# Patient Record
Sex: Male | Born: 1960 | Race: White | Hispanic: No | Marital: Married | Smoking: Never smoker
Health system: Southern US, Community
[De-identification: ages and names within clinical notes are randomized; demographics above are authoritative.]

## PROBLEM LIST (undated history)

## (undated) DIAGNOSIS — H269 Unspecified cataract: Secondary | ICD-10-CM

## (undated) DIAGNOSIS — N183 Chronic kidney disease, stage 3 unspecified: Secondary | ICD-10-CM

## (undated) DIAGNOSIS — H01006 Unspecified blepharitis left eye, unspecified eyelid: Secondary | ICD-10-CM

## (undated) DIAGNOSIS — E119 Type 2 diabetes mellitus without complications: Secondary | ICD-10-CM

## (undated) DIAGNOSIS — I6502 Occlusion and stenosis of left vertebral artery: Secondary | ICD-10-CM

## (undated) DIAGNOSIS — I252 Old myocardial infarction: Secondary | ICD-10-CM

## (undated) DIAGNOSIS — E1142 Type 2 diabetes mellitus with diabetic polyneuropathy: Secondary | ICD-10-CM

## (undated) DIAGNOSIS — E669 Obesity, unspecified: Secondary | ICD-10-CM

## (undated) DIAGNOSIS — I10 Essential (primary) hypertension: Secondary | ICD-10-CM

## (undated) DIAGNOSIS — Z85828 Personal history of other malignant neoplasm of skin: Secondary | ICD-10-CM

## (undated) DIAGNOSIS — Z794 Long term (current) use of insulin: Secondary | ICD-10-CM

## (undated) DIAGNOSIS — E1169 Type 2 diabetes mellitus with other specified complication: Secondary | ICD-10-CM

## (undated) DIAGNOSIS — H01003 Unspecified blepharitis right eye, unspecified eyelid: Secondary | ICD-10-CM

## (undated) DIAGNOSIS — K219 Gastro-esophageal reflux disease without esophagitis: Secondary | ICD-10-CM

## (undated) DIAGNOSIS — Z9889 Other specified postprocedural states: Secondary | ICD-10-CM

## (undated) DIAGNOSIS — E785 Hyperlipidemia, unspecified: Secondary | ICD-10-CM

## (undated) DIAGNOSIS — E66811 Obesity, class 1: Secondary | ICD-10-CM

## (undated) DIAGNOSIS — E118 Type 2 diabetes mellitus with unspecified complications: Secondary | ICD-10-CM

## (undated) HISTORY — DX: Type 2 diabetes mellitus without complications: E11.9

## (undated) HISTORY — DX: Chronic kidney disease, stage 3 unspecified: N18.30

## (undated) HISTORY — DX: Type 2 diabetes mellitus with other specified complication: E78.5

## (undated) HISTORY — PX: CHOLECYSTECTOMY: SHX55

## (undated) HISTORY — DX: Unspecified blepharitis right eye, unspecified eyelid: H01.003

## (undated) HISTORY — PX: TRANSTHORACIC ECHOCARDIOGRAM: SHX275

## (undated) HISTORY — DX: Essential (primary) hypertension: I10

## (undated) HISTORY — DX: Gastro-esophageal reflux disease without esophagitis: K21.9

## (undated) HISTORY — DX: Unspecified blepharitis left eye, unspecified eyelid: H01.006

## (undated) HISTORY — DX: Occlusion and stenosis of left vertebral artery: I65.02

## (undated) HISTORY — DX: Type 2 diabetes mellitus with diabetic polyneuropathy: E11.42

## (undated) HISTORY — DX: Old myocardial infarction: I25.2

## (undated) HISTORY — DX: Long term (current) use of insulin: Z79.4

## (undated) HISTORY — DX: Obesity, class 1: E66.811

## (undated) HISTORY — DX: Unspecified cataract: H26.9

## (undated) HISTORY — DX: Other specified postprocedural states: Z85.828

## (undated) HISTORY — DX: Type 2 diabetes mellitus with other specified complication: E11.69

## (undated) HISTORY — DX: Obesity, unspecified: E66.9

## (undated) HISTORY — DX: Type 2 diabetes mellitus with unspecified complications: E11.8

## (undated) HISTORY — DX: Other specified postprocedural states: Z98.890

---

## 2010-04-29 HISTORY — PX: NEPHRECTOMY: SHX65

## 2013-04-29 DIAGNOSIS — I2510 Atherosclerotic heart disease of native coronary artery without angina pectoris: Secondary | ICD-10-CM

## 2013-04-29 HISTORY — DX: Atherosclerotic heart disease of native coronary artery without angina pectoris: I25.10

## 2013-04-29 HISTORY — PX: CORONARY STENT INTERVENTION: CATH118234

## 2013-07-26 DIAGNOSIS — E785 Hyperlipidemia, unspecified: Secondary | ICD-10-CM | POA: Insufficient documentation

## 2013-07-26 DIAGNOSIS — E1169 Type 2 diabetes mellitus with other specified complication: Secondary | ICD-10-CM | POA: Insufficient documentation

## 2013-07-26 DIAGNOSIS — I10 Essential (primary) hypertension: Secondary | ICD-10-CM | POA: Insufficient documentation

## 2014-02-15 ENCOUNTER — Other Ambulatory Visit: Payer: Self-pay | Admitting: Podiatrist

## 2014-02-15 ENCOUNTER — Encounter: Payer: Self-pay | Admitting: Podiatrist

## 2014-02-15 ENCOUNTER — Ambulatory Visit (INDEPENDENT_AMBULATORY_CARE_PROVIDER_SITE_OTHER): Payer: Medicare Other | Admitting: Podiatrist

## 2014-02-15 ENCOUNTER — Ambulatory Visit (INDEPENDENT_AMBULATORY_CARE_PROVIDER_SITE_OTHER): Payer: Medicare Other

## 2014-02-15 VITALS — BP 130/89 | HR 76 | Resp 12

## 2014-02-15 DIAGNOSIS — R52 Pain, unspecified: Secondary | ICD-10-CM

## 2014-02-15 DIAGNOSIS — D3610 Benign neoplasm of peripheral nerves and autonomic nervous system, unspecified: Secondary | ICD-10-CM

## 2014-02-15 DIAGNOSIS — E1149 Type 2 diabetes mellitus with other diabetic neurological complication: Secondary | ICD-10-CM

## 2014-02-15 MED ORDER — PREGABALIN 150 MG PO CAPS: 150.0000 mg | ORAL_CAPSULE | Freq: Two times a day (BID) | ORAL | Status: DC

## 2014-02-15 NOTE — Progress Notes (Signed)
   Subjective:    Patient ID: AMER ALCINDOR, male    DOB: [birth year], 53 y.o.   MRN: [id number]  HPI  PT STATED B/L UNDER ARCH AND BALL OF THE FOOT IS PAINFUL AND HAVE BURNING SENSATION FOR 3 MONTHS. FEET ARE GETTING WORSE AND GET AGGRAVATED BY WALKING. TRIED NO TREATMENT.   ALSO CHECK B/L TOENAILS ARE PAINFUL AND HAVE DISCOLORATION.  Review of Systems  All other systems reviewed and are negative.      Objective:   Physical Exam GENERAL APPEARANCE: Alert, conversant. Appropriately groomed. No acute distress.  VASCULAR: Pedal pulses palpable at 2/4 DP and PT bilateral.  Capillary refill time is immediate to all digits,  Proximal to distal cooling it warm to warm.  Digital hair growth is present bilateral  NEUROLOGIC: sensation is intact epicritically and protectively to 5.07 monofilament at 0/5 sites bilateral.  Light touch is decreased bilateral, vibratory sensation decreased bilateral.  Neuroma symptomatology noted 3rd interspace bilateral subjectively.  MUSCULOSKELETAL: acceptable muscle strength, tone and stability bilateral.  Intrinsic muscluature intact bilateral.  Rectus appearance of foot and digits noted bilateral.   DERMATOLOGIC: digital nails are yellow, friable and mycotic.  No interdigital maceration present.  No pre ulcerative lesion.     Assessment & Plan:  Diabetic peripheral neuropathy, neuroma bilateral  Plan:  Recommended injections into the neuroma areas and patient declined.  Started him on Lyrica as he did not notice any improvement with Gabapentin.  Wrote for 150mg  twice a day.  He will call if there is no improvement in 4 weeks.

## 2014-02-15 NOTE — Patient Instructions (Signed)

## 2015-01-28 HISTORY — PX: BASAL CELL CARCINOMA EXCISION: SHX1214

## 2015-04-10 HISTORY — PX: LEFT HEART CATH AND CORONARY ANGIOGRAPHY: CATH118249

## 2015-04-10 HISTORY — PX: CORONARY STENT INTERVENTION: CATH118234

## 2016-04-04 HISTORY — PX: CORONARY STENT INTERVENTION: CATH118234

## 2016-04-04 HISTORY — PX: LEFT HEART CATH AND CORONARY ANGIOGRAPHY: CATH118249

## 2016-06-27 DIAGNOSIS — Z8679 Personal history of other diseases of the circulatory system: Secondary | ICD-10-CM

## 2016-06-27 HISTORY — DX: Personal history of other diseases of the circulatory system: Z86.79

## 2016-06-27 HISTORY — PX: PACEMAKER IMPLANT: EP1218

## 2018-08-20 HISTORY — PX: NM MYOVIEW LTD: HXRAD82

## 2018-11-17 HISTORY — PX: CORONARY STENT INTERVENTION: CATH118234

## 2018-11-17 HISTORY — PX: LEFT HEART CATH AND CORONARY ANGIOGRAPHY: CATH118249

## 2018-12-25 HISTORY — PX: OTHER SURGICAL HISTORY: SHX169

## 2018-12-28 DIAGNOSIS — I2510 Atherosclerotic heart disease of native coronary artery without angina pectoris: Secondary | ICD-10-CM | POA: Insufficient documentation

## 2018-12-28 HISTORY — PX: CORONARY STENT INTERVENTION: CATH118234

## 2018-12-28 HISTORY — PX: LEFT HEART CATH AND CORONARY ANGIOGRAPHY: CATH118249

## 2019-02-28 DIAGNOSIS — G4733 Obstructive sleep apnea (adult) (pediatric): Secondary | ICD-10-CM

## 2019-02-28 HISTORY — DX: Obstructive sleep apnea (adult) (pediatric): G47.33

## 2019-03-24 LAB — ABI
Left ABI: 125
Right ABI: 132

## 2019-04-25 DIAGNOSIS — U071 COVID-19: Secondary | ICD-10-CM

## 2019-04-25 HISTORY — DX: COVID-19: U07.1

## 2019-05-18 DIAGNOSIS — Z86711 Personal history of pulmonary embolism: Secondary | ICD-10-CM | POA: Insufficient documentation

## 2019-05-18 DIAGNOSIS — I2699 Other pulmonary embolism without acute cor pulmonale: Secondary | ICD-10-CM

## 2019-05-18 DIAGNOSIS — Z7901 Long term (current) use of anticoagulants: Secondary | ICD-10-CM | POA: Insufficient documentation

## 2019-05-18 HISTORY — DX: Other pulmonary embolism without acute cor pulmonale: I26.99

## 2019-07-15 ENCOUNTER — Encounter: Payer: Self-pay | Admitting: Cardiology

## 2019-07-15 ENCOUNTER — Encounter (INDEPENDENT_AMBULATORY_CARE_PROVIDER_SITE_OTHER): Payer: Self-pay

## 2019-07-15 ENCOUNTER — Ambulatory Visit: Payer: Medicare Other | Admitting: Cardiology

## 2019-07-15 ENCOUNTER — Other Ambulatory Visit: Payer: Self-pay

## 2019-07-15 VITALS — BP 130/70 | HR 99 | Temp 97.7°F | Ht 68.0 in | Wt 212.0 lb

## 2019-07-15 DIAGNOSIS — I2510 Atherosclerotic heart disease of native coronary artery without angina pectoris: Secondary | ICD-10-CM

## 2019-07-15 DIAGNOSIS — E785 Hyperlipidemia, unspecified: Secondary | ICD-10-CM

## 2019-07-15 DIAGNOSIS — I2699 Other pulmonary embolism without acute cor pulmonale: Secondary | ICD-10-CM

## 2019-07-15 DIAGNOSIS — E118 Type 2 diabetes mellitus with unspecified complications: Secondary | ICD-10-CM

## 2019-07-15 DIAGNOSIS — G4733 Obstructive sleep apnea (adult) (pediatric): Secondary | ICD-10-CM

## 2019-07-15 DIAGNOSIS — Z7901 Long term (current) use of anticoagulants: Secondary | ICD-10-CM

## 2019-07-15 DIAGNOSIS — R079 Chest pain, unspecified: Secondary | ICD-10-CM

## 2019-07-15 DIAGNOSIS — I10 Essential (primary) hypertension: Secondary | ICD-10-CM

## 2019-07-15 DIAGNOSIS — I25119 Atherosclerotic heart disease of native coronary artery with unspecified angina pectoris: Secondary | ICD-10-CM

## 2019-07-15 DIAGNOSIS — Z794 Long term (current) use of insulin: Secondary | ICD-10-CM

## 2019-07-15 DIAGNOSIS — Z8679 Personal history of other diseases of the circulatory system: Secondary | ICD-10-CM

## 2019-07-15 DIAGNOSIS — Z9861 Coronary angioplasty status: Secondary | ICD-10-CM

## 2019-07-15 DIAGNOSIS — T45515A Adverse effect of anticoagulants, initial encounter: Secondary | ICD-10-CM

## 2019-07-15 MED ORDER — ISOSORBIDE MONONITRATE ER 60 MG PO TB24: 60.0000 mg | ORAL_TABLET | Freq: Every day | ORAL | 3 refills | Status: DC

## 2019-07-15 MED ORDER — METOPROLOL SUCCINATE ER 100 MG PO TB24: 100.0000 mg | ORAL_TABLET | Freq: Every day | ORAL | 3 refills | Status: DC

## 2019-07-15 NOTE — Patient Instructions (Signed)
Medication Instructions:   Increase dose of Toprol Xl- 100 mg  One tablet daily  Start taking Imdur ( isosorbide mono)  60 mg one tablet daily   *If you need a refill on your cardiac medications before your next appointment, please call your pharmacy*   Lab Work: Not needed   Testing/Procedures: Not needed   Follow-Up: At [facility], you and your health needs are our priority.  As part of our continuing mission to provide you with exceptional heart care, we have created designated Provider Care Teams.  These Care Teams include your primary Cardiologist (physician) and Advanced Practice Providers (APPs -  Physician Assistants and Nurse Practitioners) who all work together to provide you with the care you need, when you need it.  We recommend signing up for the patient portal called "MyChart".  Sign up information is provided on this After Visit Summary.  MyChart is used to connect with patients for Virtual Visits (Telemedicine).  Patients are able to view lab/test results, encounter notes, upcoming appointments, etc.  Non-urgent messages can be sent to your provider as well.   To learn more about what you can do with MyChart, go to [email or web address].    Your next appointment:   3 week(s)  The format for your next appointment:   Virtual Visit   Provider:   Glenetta Hew, MD   Other Instructions

## 2019-07-15 NOTE — Progress Notes (Signed)
Primary Care Provider: Elba Barman, MD Cardiologist: Up until today, had been followed by Dr. Mathis Bud [facility]) Electrophysiologist: Dr. Minna Merritts (also [facility]) Nephrologist: Thereasa Distance, MD  Sleep Medicine: Ena Dawley, PA-C  Clinic Note: Chief Complaint  Patient presents with  . New Patient (Initial Visit)    Wants a second opinion from cardiology.  . Chest Pain    Longstanding CAD history.   HPI:    John Dodson is a 59 y.o. male who is being seen today for the evaluation of chest pain as a second opinion (cardiology) at the request of Jacobucci, Camille Bal, MD.  [name] has an extensive cardiac history with at least 12 stents placed-noted in LAD, OM1, OM 2, at least 4 in L PDA and 2 in nondominant RCA.  He also has history of second-degree Mobitz 2-third-degree AV block status post biventricular cardiac pacemaker while in [place], [place].  He has been followed by Dr. Beatrix Fetters and Dr. Minna Merritts.    He also was recently diagnosed with severe OSA (referred after September 2020 visit), and has been started on BiPAP with recent adjustments most recently on June 30, 2019.  Tested positive for COVID-19 in December 2020, then was hospitalized for acute hypoxia respite failure and found to have PE in January 2021  He was referred back to his PCP to his primary cardiologist, and was last seen on June 21, 2019 for evaluation of chest pain.  He told his PCP that he was having roughly 2 out of 10 chest pain not really radiating.  He described as retrosternal and bilateral jaw.  He describes noting it off and on for about a month.  Not associate with any exertion.  Not associate with dyspnea.  Worse with lying down.  No other heart failure symptoms.  No palpitations.  Recent Hospitalizations:   July 2020: Admitted for cardiac cath for angina-PCI of mid nondominant RCA  Clinic note November 02, 2018: Noted several months of  worsening chest squeezing and shortness of breath radiating to his neck and right arm.  Reproducible walking up flight of steps.  Also noted exertional dyspnea.  August 2020: Admitted with headache and chest pain.   At this time, he noted some chest pain radiating to the jaw with minimal troponin elevation.  Found to have occluded left vertebral artery,   also in cath found to have ostial nondominant RCA treated with another stent.  (Reportedly his 12th stent overall). -->  Patient indicated that this was likely a secondary event.  ER visit November 2020--chest pain thought felt to be pleuritic in nature.  Was going on all day long.  April 25, 2019: COVID-19 infection  January 17-20, 2021: Admitted for acute hypoxic respite failure (presumed to be bacterial pneumonia-failed outpatient doxycycline) along with DKA.  Found to have BILATERAL PE.  Found to have D-dimer very elevated along with elevated Ferritin.  Mild troponin elevation.  Started empirically on heparin for PE --> VQ scan ordered because of "contrast allergy " -> bilateral PE identified.  -->  Bridged with Lovenox for therapeutic INR (cost restraints for apixaban)  Also cover with moxifloxacin for CAP.  [place]/DKA--required insulin drip  Off of oxygen prior to discharge.  Reviewed  CV studies:    The following studies were reviewed today: (if available, images/films reviewed: From Epic Chart or Care Everywhere) procedures from 2016 forward reviewed, [place] updated, reports not scanned because of availability Care Everywhere. . Biventricular Pacemaker -> [place]  [place], B and E--likely prior to 2015.  For Mobitz 2 and THIRD-DEGREE AV block . Multiple CLINIC NOTES from PCP, Sleep Medicine, Cardiology reviewed and PMH/PSH updated accordingly including laboratory values. Marland Kitchen DISCHARGE SUMMARY from hospitalizations reviewed o July 21-23, 2020 -> unstable angina, cath with PCI and left arm hematoma o August (828-9/1) 2020  -> admitted with headache as well as chest pain radiating to jaw - Severe headache-neurology evaluation with CTA head and neck as well as carotid duplex and MRI brain.  No evidence of stroke.  Only evidence was left vertebral artery occlusion - Cardiac cath performed for chest pain-ostial and proximal RCA DES stent . CARDIAC CATHETERIZATION reports from December 2016 and 2017 as well as in July and August 2020 reviewed both on paper and through Care Everywhere select -> PSH updated, . ECHOCARDIOGRAM report from December 26, 2018, and May 18, 2019 and Myoview from April 2020 reviewed-PSH updated . CAROTID ARTERY DUPLEX from August 2020 along with CTA HEAD AND NECK PLUS MRI BRAIN REVIEWED . PULMONARY VQ SCAN May 18, 2019: Large sized wedge-shaped perfusion defects corresponding to posterior segment of right upper lobe and anterior basal segment of right lower lobe, moderate sized wedge-shaped defects in the superior-posterior left lung (correlating with apical posterior segment of left upper lobe), and several small wedge-shaped defects in the base of left lung. => Impression: Pulmonary embolism pleasant.  Bilateral.   o Upper Extremity Venous Duplex (May 18, 2019) partially occlusive acute thrombophlebitis of right cephalic vein at antecubital fossa no right IJ, subclavian, axillary, brachial or basilic vein thrombosis with excellent flow.  Total occluding thrombophlebitis of left cephalic vein at antecubital fossa but no evidence of thrombosis upstream. . ABIs March 24, 2019: Normal bilateral ABIs-right DP 1.17, PT 1.18, great toe 0.5; left 1.05, 1.18 and 0.68 effectively.  (Mildly decreased left great toe TBI) . Right lower extremity venous duplex June 29, 2019-no evidence of DVT or right lower extremity venous obstruction   Interval History:   [name] presents here today wanting to get a second opinion for cardiology.  He says he has been having ongoing off-and-on episodes of  chest pain and jaw pain pretty much since he had his PE back in January.  He really has not gotten over the dyspnea from his COVID-19 and then had on top of this the pulmonary embolus.  He says he describes it as a heavy sensation in his chest when also radiates to the jaw.  He said that part is new.  He is not sure if it is because he stopped his aspirin. He describes that the pain can happen at rest, but can also occur with walking.  Usually he just noticed chest discomfort, but it does not happen all the time.  It can also happen at rest and does not get worse with exertion.  While he told his PCP that he did not have any orthopnea or PND symptoms, he is now tells me that he is noticing some symptoms of orthopnea and PND, but no real edema.  It seems as though he is never really gotten back into full activity levels dating back to July.  In November, he was still noticing fatigue and dyspnea as well as intermittent chest discomfort.  Was evaluated the emergency room and thought to have pleuritic pain.  This was after having stents placed in his nondominant RCA.  He has had multiple echocardiogram showing normal ejection fraction, and is never really had any heart failure symptoms.  After finally starting to get involved in cardiac rehab and starting OSA treatment with BiPAP, he then developed COVID-19 followed by pneumonia and pulmonary embolism.  He is now just over a month and a half out from his pulmonary embolus and is just really not having any energy level.  He was sedentary back in September, and has not really gotten back in to do anything besides the cardiac rehab that he was doing.  He is completed with that now, and admittedly is not very active.  CV Review of Symptoms (Summary) Cardiovascular ROS: positive for - chest pain, dyspnea on exertion, orthopnea, paroxysmal nocturnal dyspnea and Fatigue negative for - edema, irregular heartbeat, palpitations, rapid heart rate or Shortness of breath  at rest.  Syncope/near syncope, TIA/amaurosis fugax, claudication  The patient does not have symptoms concerning for COVID-19 infection (fever, chills, cough, or new shortness of breath).  He was positive in late December 2020 The patient is not practicing social distancing & Masking.    REVIEWED OF SYSTEMS   Review of Systems  Constitutional: Positive for malaise/fatigue. Negative for weight loss.  HENT: Negative for congestion and nosebleeds.   Respiratory: Positive for shortness of breath (Mostly with exertion noted above.). Negative for cough.   Gastrointestinal: Negative for abdominal pain, blood in stool and melena.  Genitourinary: Negative for hematuria.  Musculoskeletal: Positive for joint pain (Right ankle and foot (along the top).  Neurological: Negative for dizziness, speech change, focal weakness, weakness and headaches (Off and on).  Psychiatric/Behavioral: Negative for memory loss. The patient is nervous/anxious. The patient does not have insomnia.   All other systems reviewed and are negative.  I have reviewed and (if needed) personally updated the patient's problem list, medications, allergies, past medical and surgical history, social and family history.   PAST MEDICAL HISTORY   Past Medical History:  Diagnosis Date  . Blepharitis of both eyes    Chronic  . Cataracts, both eyes   . CKD (chronic kidney disease) stage 3, GFR 30-59 ml/min    Status post right nephrectomy/adrenalectomy and diabetic nephropathy (baseline creatinine 1.4-1.5)  . Coronary artery disease involving native coronary artery 2015   (No cath report prior to 2016 noted, but as of 2016, had stents in LAD, OM and L PDA) as documented since now in proximal LAD, OM 2-2 stents, L PDA at least 3 if not 4 stents, and also now nondominant RCA.  Marland Kitchen COVID-19 virus infection 04/25/2019  . Diabetic peripheral neuropathy associated with type 2 diabetes mellitus ([place])   . Essential hypertension   . GERD without  esophagitis   . History of basal cell carcinoma (BCC) excision   . History of complete heart block 06/2016   Status post pacemaker placement  . History of non-ST elevation myocardial infarction (NSTEMI)    And several occasions of unstable angina  . Hyperlipidemia associated with type 2 diabetes mellitus [facility])    Per PCP note in February 2021, was on atorvastatin 40 mg.  . Obesity (BMI 30.0-34.9)   . Occlusion of left vertebral artery    Consider repossible acute lesion in August 2020 with presentation of headache.  CTA suggested occluded left vertebral artery throughout the neck.  Faint string-like enhancement intermittently visible suggesting recent occlusion.  Partial reconstruction and posterior fossa.  Left PICA patent.  (Consider possible left vertebral artery dissection)  . OSA treated with BiPAP 02/2019   Diagnosis in late 2020 ([facility])--> delayed onset of treatment with BiPAP due to Covid hospitalization  followed by PE. ->  BiPAP setting at 21/17 cm  . Pulmonary embolism ([place]) 05/18/2019   (1 month following COVID-19 infection): DVT-PE (bilateral PEs noted on VQ scan)-> started on warfarin.  (On warfarin plus Plavix now with aspirin stopped.)  . Type 2 diabetes mellitus with complication, with long-term current use of insulin (HCC)    On Lantus, Jardiance, Metformin & Onglyza    PAST SURGICAL HISTORY   Past Surgical History:  Procedure Laterality Date  . BASAL CELL CARCINOMA EXCISION  01/2015  . CAROTID DUPLEX SCAN  12/25/2018   [facility]: Mild plaque in both carotids.  139% bilateral.  Right vertebral flow normal antegrade, Left not seen; normal bilateral subclavian flow..  . CHOLECYSTECTOMY    . CORONARY STENT INTERVENTION  04/10/2015   ([place]; Bishop Limbo, DO) -> DES PCI mLPDA: Xience Alpine DES 2.5 mm x 18 mm, Xience Alpine DES 2.25 mm x 12 mm overlapping.  . CORONARY STENT INTERVENTION  04/04/2016   ([place]; [place], MD)--> (urgent) 100% mLPDA PTCA with 2.25 mm balloon - reduced to 50%; mid OM2 90% - DES PCI (Xience DES  2.5 x 18).   . CORONARY STENT INTERVENTION  2015   Prior to December 2016, STENTS noted in prox LAD, proximal OM1, and at least 2 stents in proximal L PDA  . CORONARY STENT INTERVENTION  11/17/2018   ([place]; Bishop Limbo, DO): CULPRIT: mid 90% (DES PCI) -Resolute Onyx DES 2.5 mm x 30 mm --> COMPLICATION: Large left arm hematoma-evaluated by vascular and orthopedic surgery, managed with splint and arm elevation.  . CORONARY STENT INTERVENTION  12/28/2018   ([place]; Clarene Critchley, MD, referred by Dr. Mathis Bud) --> INDICATION (urgent, Unstable Angina): Moderate-severe (70%) stenosis of ostial RCA  -> DES PCI Resolute Onyx DES 2.5 mm x 12 mm.   Marland Kitchen LEFT HEART CATH AND CORONARY ANGIOGRAPHY  04/10/2015   ([place]; Bishop Limbo, DO)--> EF 55%.  Mild inferior HK.  Coronaries-LM: Normal, p LAD STENT 30% ISR, mLAD 20% & diffuse dLAD ~20%; Dom LCx: mCx 20%, ~pOM1 STENT (noted as OM2 in other reports) patent with mid 30%, OM2 normal, prox LPDA "STENTS" patent with SEVERE mid L PDA 90-95% (DES PCI x 2 overlapping); Small-non-dominant RCA  patent   . LEFT HEART CATH AND CORONARY ANGIOGRAPHY  04/04/2016   ([place]; [place], MD)--> (urgent): EF 70%.  Previous LAD,~OM2 and proximal PDA stents patent; -> mLAD 35%, dLAD 40%; LCx-OM1 75% (short lesion, med Rx), mid OM2 90% (DES PCI), pLPDA stents patent w/ mPDA 100% (PTCA only - reduced to 50%); non-dom RCA - ost RCA 60% & mRCA 75%  . LEFT HEART CATH AND CORONARY ANGIOGRAPHY  11/17/2018   ([place]; Bishop Limbo, DO) indication: Angina.  LM normal; LAD - ost LAD ~50%, pLAD STENT ~30% ISR with dLAD ~40%; Dom LCx - ost & prox Cx 30%, OM1 STENT 20% ISR, OM2 STENT 50% distal edge, pLPDA overlapping STENTS ~20%; nonDom RCA - proxRCA 40%, mid 90% (DES PCI)  . LEFT HEART CATH AND  CORONARY ANGIOGRAPHY  12/28/2018   ([place]; Clarene Critchley, MD, referred by Dr. Mathis Bud) --> INDICATION (urgent, Unstable Angina): Moderate-severe (70%) stenosis of ostial RCA (DES PCI), mRCA 30%.  Otherwise no significant change from July 2020: dLM 20%, pLAD ~40% ISR , dLAD long/diffuse ~50%; OstLCx 30%, OM1 ~15% OM2 ~30% with patent  LPDA stents/PTCA site.   Marland Kitchen NEPHRECTOMY  Right 2012   With adrenalectomy  . NM MYOVIEW LTD  08/20/2018   [facility] -> Lexiscan Myoview: EF 81%.  No reversible ischemia or infarction.  Normal wall motion.  Marland Kitchen PACEMAKER IMPLANT  06/2016   [facility] [place] ([employer]) -> according to CT of chest, leads positioned in right atrium, cardiac apex and coronary sinus (suggesting CRT-P - BiV Pacing))  . TRANSTHORACIC ECHOCARDIOGRAM  11/2018; 05/01/2019   ([place]) a) EF 60-65%.  Mild TR.;; b)moderate concentric LVH.  EF 55 to 60%.  No R WMA.  Normal RV size and function.  Normal atrial sizes.  Normal valves.    MEDICATIONS/ALLERGIES crazy   Current Meds  Medication Sig  . albuterol (VENTOLIN HFA) 108 (90 Base) MCG/ACT inhaler Inhale 2 puffs into the lungs every 4 (four) hours as needed for wheezing or shortness of breath.  Marland Kitchen atorvastatin (LIPITOR) 40 MG tablet Take 40 mg by mouth daily.  . benzonatate (TESSALON) 200 MG capsule Take 200 mg by mouth 3 (three) times daily as needed for cough.  . calcium-vitamin D (OSCAL WITH D) 500-200 MG-UNIT tablet Take 1 tablet by mouth. 50 MCG (2000UNIT ) TAB CHOLECALCIFEROL  . clopidogrel (PLAVIX) 75 MG tablet Take 75 mg by mouth daily.  Marland Kitchen doxycycline (VIBRA-TABS) 100 MG tablet Take 100 mg by mouth 2 (two) times daily.  . empagliflozin (JARDIANCE) 25 MG TABS tablet Take 25 mg by mouth daily.  . fluorouracil (EFUDEX) 5 % cream Apply topically 2 (two) times daily.  . hydrocortisone 2.5 % lotion Apply topically 2 (two) times daily.  . insulin glargine (LANTUS SOLOSTAR) 100  UNIT/ML Solostar Pen Inject 100 Units into the skin daily. 100UNITS/3 ML  . losartan (COZAAR) 50 MG tablet Take 50 mg by mouth daily.  . magnesium oxide (MAG-OX) 400 MG tablet Take 500 mg by mouth daily. TAKE 1  TABLET BY MOUTH NIGHTLY FOR LEG CRAMPS  . metFORMIN (GLUCOPHAGE) 1000 MG tablet   . NITROSTAT 0.4 MG SL tablet   . Omega-3 Fatty Acids (FISH OIL BURP-LESS) 1000 MG CAPS Take 1,000 mg by mouth 2 (two) times daily.  . ONGLYZA 5 MG TABS tablet   . Oxcarbazepine (TRILEPTAL) 300 MG tablet   . pantoprazole (PROTONIX) 40 MG tablet   . pregabalin (LYRICA) 150 MG capsule Take 1 capsule (150 mg total) by mouth 2 (two) times daily. Take 1 tablet at dinner and 1 tablet before bedtime.  . triamcinolone cream (KENALOG) 0.1 % Apply 1 application topically 2 (two) times daily.  . VOLTAREN 1 % GEL   . [DISCONTINUED] metoprolol succinate (TOPROL-XL) 50 MG 24 hr tablet Take 50 mg by mouth daily. Take with or immediately following a meal.  Also taking atorvastatin 40 mg p.o. daily.  Allergies  Allergen Reactions  . Latex Rash  . Codeine Nausea And Vomiting  . Contrast Media [Iodinated Diagnostic Agents]     Reportedly cardiac arrest  . Integrilin [Eptifibatide]     Reportedly had shortness of breath, confusion.  . Tylenol [Acetaminophen]   . Zithromax [Azithromycin] Nausea And Vomiting  . Glipizide Rash    Headache    SOCIAL HISTORY/FAMILY HISTORY   Social History   Tobacco Use  . Smoking status: Never Smoker  . Smokeless tobacco: Never Used  Substance Use Topics  . Alcohol use: No  . Drug use: No   Social History   Social History Narrative  . Not on file   Family History  Problem Relation Age of Onset  .  Stroke Mother   . Hypertension Mother   . Hyperlipidemia Mother   . Hypertension Father   . Hyperlipidemia Father   . Coronary artery disease Father      OBJCTIVE -PE, EKG, labs   Wt Readings from Last 3 Encounters:  07/15/19 212 lb (96.2 kg)  Was 93.7 kg - 207 pounds  at ER visits on March 3.  Physical Exam: BP 130/70   Pulse 99   Temp 97.7 F (36.5 C)   Ht 5\' 8"  (1.727 m)   Wt 212 lb (96.2 kg)   SpO2 99%   BMI 32.23 kg/m  Physical Exam  Constitutional: He is oriented to person, place, and time. He appears well-developed and well-nourished. No distress.  Well-groomed.  Healthy-appearing.  HENT:  Head: Normocephalic and atraumatic.  Mouth/Throat: Oropharynx is clear and moist.  Eyes: Pupils are equal, round, and reactive to light. Conjunctivae and EOM are normal.  Neck: No hepatojugular reflux and no JVD present. Carotid bruit is not present.  Cardiovascular: Normal rate, regular rhythm, S1 normal, intact distal pulses and normal pulses.  No extrasystoles are present. PMI is not displaced. Exam reveals no gallop and no friction rub.  No murmur heard. Physiologically split S2 (V paced)  Pulmonary/Chest: Breath sounds normal. No respiratory distress. He has no wheezes. He has no rales.  Left upper chest pacemaker c/d/i.  Abdominal: Soft. Bowel sounds are normal. He exhibits no distension. There is no abdominal tenderness. There is no rebound.  Musculoskeletal:        General: Edema present. Normal range of motion.     Cervical back: Normal range of motion and neck supple.  Neurological: He is alert and oriented to person, place, and time. No cranial nerve deficit.  Skin: Skin is warm and dry. No rash noted. No erythema.  Psychiatric: He has a normal mood and affect. His behavior is normal. Judgment and thought content normal.  Vitals reviewed.   Adult ECG Report  Rate: 99 ;  Rhythm: Atrial sensed, ventricular paced.;   Narrative Interpretation: Stable EKG.  Recent Labs: From PCP June 27, 2019  Na+ 140, K+ 4.7, Cl- 105, HCO3-27, BUN 22, Cr 1.55, Glu 211, Ca2+ 9.0;   A1c as of May 15, 2019-9.4  (05/27/2019) CBC: W 8.5, H/H 13.4/40.4, Plt 177  May 2020: TC 123, TG 139, HDL 33, LDL 69 ->     No results found for: CHOL, HDL,  LDLCALC, LDLDIRECT, TRIG, CHOLHDL No results found for: CREATININE, BUN, NA, K, CL, CO2 No results found for: TSH  ASSESSMENT/PLAN    Problem List Items Addressed This Visit    Coronary artery disease involving native coronary artery of native heart with angina pectoris ([place]) (Chronic)    He has had multiple interventions, and has had different presentations.  His current chest pain has some typical but otherwise atypical features for angina.  Very difficult to assess.  He is frustrated because he felt as though his primary cardiologist was not interested in his chest pain, and felt that it should be evaluated.  He has had 2 catheter DES PCI of the last year as well as a Myoview back in April that was nonischemic.  I think probably that would be the best option for evaluating to see if is any further ischemia.  For now I would like to titrate up his medications:  Plan: Add Imdur 60 mg daily, increase Toprol to 100 mg daily.  Per report, he is no longer taking aspirin as he  is now on warfarin and Plavix.  He is on statin with prewell-controlled lipids.  On both Jardiance and Onglyza  3-week follow-up to reassess symptoms, this is in part for me to have time to evaluate his extensive history.  Depending on how he is doing in follow-up, we will consider ischemic evaluation with Myoview versus relook catheterization depending on his symptoms.  Cardiac Cath should not be undertaken lightly for 2 reasons: CKD-3 as well as history of cardiac arrest from contrast hypersensitivity.  He would require prehydration and premedication.      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  Is now on warfarin.   Apparently had financial issues with apixaban. I suspect that his hypercoagulability was probably related to his recent COVID-19 infection.  Plan would probably be full dose anticoagulation for least a year and then reassess.  I would do a hypercoagulable screen prior to stopping warfarin.  Need to watch for signs of bleeding as he is on warfarin plus Plavix.      Relevant Medications   aspirin EC 81 MG tablet   losartan (COZAAR) 50 MG tablet   metoprolol succinate (TOPROL-XL) 100 MG 24 hr tablet   isosorbide mononitrate (IMDUR) 60 MG 24 hr tablet   atorvastatin (LIPITOR) 40 MG tablet   Hyperlipidemia with target LDL less than 70 (Chronic)    Most recent lipids were from May 2020.  Been followed by PCP.  He is on atorvastatin at 40 mg (was not listed on his current meds.  I have updated his med list.  Should be due for follow-up labs soon.      Relevant Medications   aspirin EC 81 MG tablet   losartan (COZAAR) 50 MG tablet   metoprolol succinate (TOPROL-XL) 100 MG 24 hr tablet   isosorbide mononitrate (IMDUR) 60 MG 24 hr tablet   atorvastatin (LIPITOR) 40 MG tablet   Essential hypertension (Chronic)    Blood pressure looks stable.  He had both ramipril and Lantus listed.  Based on his PCPs notes, he was not on ramipril.  I taken this off.  He is on losartan 50 mg along with Toprol.  He does have blood pressure in first we will titrate up further to 100 mg Toprol given his resting heart rate of 90 bpm.  He has biventricular pacing, therefore I am not concerned about bradycardia.      Relevant Medications   aspirin EC 81 MG tablet   losartan (COZAAR) 50 MG tablet   metoprolol succinate (TOPROL-XL) 100 MG 24 hr tablet   isosorbide mononitrate (IMDUR) 60 MG 24 hr tablet   atorvastatin (LIPITOR) 40 MG tablet   CAD S/P PCI (Chronic)    Documented stents in proximal LAD with 30-40% % ISR along with diffuse distal 50%, proximal OM1 and OM 2 with 30% ISR, has had disease distal to stents  in L PDA and now has 2 DES stents in RCA. ->  Previous note indicated the plan was for him to be on Plavix plus warfarin until August 2021, at which point he would then go back to aspirin plus warfarin.  I would prefer to keep him on lifelong Plavix along with warfarin  as long as he does not have any further bleeding issues.  With this med stent, he needs to be on antiplatelet agent and does not get adequate protection from warfarin.      Relevant Medications   aspirin EC 81 MG tablet   losartan (COZAAR) 50 MG tablet   metoprolol succinate (TOPROL-XL) 100 MG 24 hr tablet   isosorbide mononitrate (IMDUR) 60 MG 24 hr tablet   atorvastatin (LIPITOR) 40 MG tablet   History of complete heart block (Chronic)    Status post biventricular pacemaker placement.  We will need to determine what he plans to do as far as monitoring this.  To be quite honest, I think it makes sense for him to maintain management with his current cardiologist and electrophysiologist because of the longstanding relationship.  Extremely complicated transferring care with his longstanding history.      Chest pain with moderate risk for cardiac etiology    Multiple atypical features for angina.  For now, will titrate up further antianginal medicine with adding Imdur and titrating beta-blocker. Excellent reassess in roughly 3 weeks and will determine next steps for evaluation.  Chest pain could be related to his recent PE.      Type 2 diabetes mellitus with complication, with long-term current use of insulin (HCC)   Relevant Medications   aspirin EC 81 MG tablet   empagliflozin (JARDIANCE) 25 MG TABS tablet   insulin glargine (LANTUS SOLOSTAR) 100 UNIT/ML Solostar Pen   losartan (COZAAR) 50 MG tablet   atorvastatin (LIPITOR) 40 MG tablet   OSA treated with BiPAP       COVID-19 Education: The signs and symptoms of COVID-19 were discussed with the patient and how to seek care for testing (follow up with PCP or  arrange E-visit).   The importance of social distancing was discussed today.  I spent a total of 28 minutes with the patient. >  50% of the time was spent in direct patient consultation.  Additional time spent with chart review  / charting (studies, outside notes, etc): 90 min The patient had almost 2 years of clinic notes, cath, echo, Myoview reports and hospital admission discharge records.  Extensive time was spent reviewing these charts and updating [place]. Total Time: ~2hr min   Current medicines are reviewed at length with the patient today.  (+/- concerns) n/a   Patient Instructions / Medication Changes & Studies & Tests Ordered   Patient Instructions  Medication Instructions:   Increase dose of Toprol Xl- 100 mg  One tablet daily  Start taking Imdur ( isosorbide mono)  60 mg one tablet daily   *If you need a refill on your cardiac medications before your next appointment, please call your pharmacy*   Lab Work: Not needed   Testing/Procedures: Not needed   Follow-Up: At [facility], you and your health needs are our priority.  As part of our continuing mission to provide you with exceptional heart care, we have created designated Provider Care Teams.  These Care Teams include your primary Cardiologist (physician) and Advanced Practice Providers (APPs -  Physician Assistants and Nurse Practitioners) who all work together to provide you with the care you need, when you need it.  We recommend signing up for the patient portal called "MyChart".  Sign up information is provided on this After Visit Summary.  MyChart is used to connect with patients for Virtual Visits (Telemedicine).  Patients are able to view lab/test results, encounter notes, upcoming appointments,  etc.  Non-urgent messages can be sent to your provider as well.   To learn more about what you can do with MyChart, go to [email or web address].    Your next appointment:   3 week(s)  The  format for your next appointment:   Virtual Visit   Provider:   Glenetta Hew, MD   Other Instructions     Studies Ordered:   No orders of the defined types were placed in this encounter.    Glenetta Hew, M.D., M.S. Interventional Cardiologist   Pager # [phone] Phone # [phone] [address] [place], [place] [address]   Thank you for choosing Heartcare at [facility]!!

## 2019-07-27 ENCOUNTER — Encounter: Payer: Self-pay | Admitting: Cardiology

## 2019-07-27 DIAGNOSIS — E118 Type 2 diabetes mellitus with unspecified complications: Secondary | ICD-10-CM | POA: Insufficient documentation

## 2019-07-27 DIAGNOSIS — R079 Chest pain, unspecified: Secondary | ICD-10-CM | POA: Insufficient documentation

## 2019-07-27 DIAGNOSIS — I25119 Atherosclerotic heart disease of native coronary artery with unspecified angina pectoris: Secondary | ICD-10-CM | POA: Insufficient documentation

## 2019-07-27 NOTE — Assessment & Plan Note (Signed)
Blood pressure looks stable.  He had both ramipril and Lantus listed.  Based on his PCPs notes, he was not on ramipril.  I taken this off.  He is on losartan 50 mg along with Toprol.  He does have blood pressure in first we will titrate up further to 100 mg Toprol given his resting heart rate of 90 bpm.  He has biventricular pacing, therefore I am not concerned about bradycardia.

## 2019-07-27 NOTE — Assessment & Plan Note (Signed)
Status post biventricular pacemaker placement.  We will need to determine what he plans to do as far as monitoring this.  To be quite honest, I think it makes sense for him to maintain management with his current cardiologist and electrophysiologist because of the longstanding relationship.  Extremely complicated transferring care with his longstanding history.

## 2019-07-27 NOTE — Assessment & Plan Note (Signed)
Multiple atypical features for angina.  For now, will titrate up further antianginal medicine with adding Imdur and titrating beta-blocker. Excellent reassess in roughly 3 weeks and will determine next steps for evaluation.  Chest pain could be related to his recent PE.

## 2019-07-27 NOTE — Assessment & Plan Note (Signed)
Most recent lipids were from May 2020.  Been followed by PCP.  He is on atorvastatin at 40 mg (was not listed on his current meds.  I have updated his med list.  Should be due for follow-up labs soon.

## 2019-07-27 NOTE — Assessment & Plan Note (Signed)
He has had multiple interventions, and has had different presentations.  His current chest pain has some typical but otherwise atypical features for angina.  Very difficult to assess.  He is frustrated because he felt as though his primary cardiologist was not interested in his chest pain, and felt that it should be evaluated.  He has had 2 catheter DES PCI of the last year as well as a Myoview back in April that was nonischemic.  I think probably that would be the best option for evaluating to see if is any further ischemia.  For now I would like to titrate up his medications:  Plan: Add Imdur 60 mg daily, increase Toprol to 100 mg daily.  Per report, he is no longer taking aspirin as he is now on warfarin and Plavix.  He is on statin with prewell-controlled lipids.  On both Jardiance and Onglyza  3-week follow-up to reassess symptoms, this is in part for me to have time to evaluate his extensive history.  Depending on how he is doing in follow-up, we will consider ischemic evaluation with Myoview versus relook catheterization depending on his symptoms.  Cardiac Cath should not be undertaken lightly for 2 reasons: CKD-3 as well as history of cardiac arrest from contrast hypersensitivity.  He would require prehydration and premedication.

## 2019-07-27 NOTE — Assessment & Plan Note (Signed)
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 ->  Previous note indicated the plan was for him to be on Plavix plus warfarin until August 2021, at which point he would then go back to aspirin plus warfarin.  I would prefer to keep him on lifelong Plavix along with warfarin as long as he does not have any further bleeding issues.  With this med stent, he needs to be on antiplatelet agent and does not get adequate protection from warfarin.

## 2019-07-27 NOTE — Assessment & Plan Note (Signed)
PE documented in January 2020.  Is now on warfarin.  Apparently had financial issues with apixaban. I suspect that his hypercoagulability was probably related to his recent COVID-19 infection.  Plan would probably be full dose anticoagulation for least a year and then reassess.  I would do a hypercoagulable screen prior to stopping warfarin.  Need to watch for signs of bleeding as he is on warfarin plus Plavix.

## 2019-08-06 ENCOUNTER — Telehealth: Payer: Medicare Other | Admitting: Cardiology

## 2019-08-10 NOTE — Telephone Encounter (Signed)
Attempted to contact pt in regards to [employer]. Also, sent message stating office is currently closed but to seek emergent care if he is currently having CP.

## 2019-08-12 ENCOUNTER — Encounter: Payer: Self-pay | Admitting: Cardiology

## 2019-08-12 ENCOUNTER — Telehealth: Payer: Self-pay | Admitting: *Deleted

## 2019-08-12 ENCOUNTER — Telehealth (INDEPENDENT_AMBULATORY_CARE_PROVIDER_SITE_OTHER): Payer: Medicare Other | Admitting: Cardiology

## 2019-08-12 ENCOUNTER — Telehealth: Payer: Self-pay | Admitting: Cardiology

## 2019-08-12 VITALS — BP 147/68 | HR 72 | Temp 97.3°F | Ht 68.0 in | Wt 213.0 lb

## 2019-08-12 DIAGNOSIS — Z8679 Personal history of other diseases of the circulatory system: Secondary | ICD-10-CM

## 2019-08-12 DIAGNOSIS — I2699 Other pulmonary embolism without acute cor pulmonale: Secondary | ICD-10-CM | POA: Diagnosis not present

## 2019-08-12 DIAGNOSIS — Z9861 Coronary angioplasty status: Secondary | ICD-10-CM

## 2019-08-12 DIAGNOSIS — I2510 Atherosclerotic heart disease of native coronary artery without angina pectoris: Secondary | ICD-10-CM | POA: Diagnosis not present

## 2019-08-12 DIAGNOSIS — Z7901 Long term (current) use of anticoagulants: Secondary | ICD-10-CM

## 2019-08-12 DIAGNOSIS — I200 Unstable angina: Secondary | ICD-10-CM | POA: Insufficient documentation

## 2019-08-12 DIAGNOSIS — E785 Hyperlipidemia, unspecified: Secondary | ICD-10-CM

## 2019-08-12 DIAGNOSIS — I10 Essential (primary) hypertension: Secondary | ICD-10-CM

## 2019-08-12 DIAGNOSIS — T45515A Adverse effect of anticoagulants, initial encounter: Secondary | ICD-10-CM

## 2019-08-12 DIAGNOSIS — I25119 Atherosclerotic heart disease of native coronary artery with unspecified angina pectoris: Secondary | ICD-10-CM | POA: Diagnosis not present

## 2019-08-12 MED ORDER — PREDNISONE 50 MG PO TABS: ORAL_TABLET | ORAL | 0 refills | Status: DC

## 2019-08-12 NOTE — Assessment & Plan Note (Signed)
Continues to be on atorvastatin. She will be due for reevaluation likely next month.  Has been stable on current dose of statin.

## 2019-08-12 NOTE — Assessment & Plan Note (Signed)
At this point, he continues to have chest pain requiring nitroglycerin despite being on pretty much optimal management with Imdur, Ranexa and high-dose Toprol. Only other potential anticoagulant medicine option we have is amlodipine.  At this point, I think the only option we have is to proceed with cardiac catheterization.  He has a stress test scheduled by his electrophysiologist for later this month, but with his ongoing symptoms, I do not think that we can discount his symptoms even if he has a negative stress test because he has multivessel disease involvement.  Plan: We will schedule LEFT HEART CATHETERIZATION WITH NEGATIVE AND CORONARY ANGIOGRAPHY AND POSSIBLE PERCUTANEOUS CORONARY INTERVENTION   CONTINUE IMDUR AND RANEXA AND BETA-BLOCKER  HE WILL NEED TO BRIDGE FROM WARFARIN WITH LOVENOX.  WILL HAVE [facility] PHARMACY TEAM DISCUSS WITH HIS COUMADIN CLINIC THE BEST WAY TO HANDLE THIS.  This required communication between our team and his Coumadin clinic team.  He will also have to cancel his Myoview stress test.  He does have [place] which requires premedication, and only 1 kidney requiring additional precath hydration.  He will be scheduled for second case.

## 2019-08-12 NOTE — Telephone Encounter (Signed)
Patient calling to speak with Ivin Booty, Dr. Allison Quarry nurse. He states that he was supposed to hear back from her about scheduling his cath.

## 2019-08-12 NOTE — Telephone Encounter (Signed)
  Patient Consent for Virtual Visit         John Dodson has provided verbal consent on 08/12/2019 for a virtual visit (video or telephone).   CONSENT FOR VIRTUAL VISIT FOR:  John Dodson  By participating in this virtual visit I agree to the following:  I hereby voluntarily request, consent and authorize [facility] and its employed or contracted physicians, physician assistants, nurse practitioners or other licensed health care professionals (the Practitioner), to provide me with telemedicine health care services (the "Services") as deemed necessary by the treating Practitioner. I acknowledge and consent to receive the Services by the Practitioner via telemedicine. I understand that the telemedicine visit will involve communicating with the Practitioner through live audiovisual communication technology and the disclosure of certain medical information by electronic transmission. I acknowledge that I have been given the opportunity to request an in-person assessment or other available alternative prior to the telemedicine visit and am voluntarily participating in the telemedicine visit.  I understand that I have the right to withhold or withdraw my consent to the use of telemedicine in the course of my care at any time, without affecting my right to future care or treatment, and that the Practitioner or I may terminate the telemedicine visit at any time. I understand that I have the right to inspect all information obtained and/or recorded in the course of the telemedicine visit and may receive copies of available information for a reasonable fee.  I understand that some of the potential risks of receiving the Services via telemedicine include:  Marland Kitchen Delay or interruption in medical evaluation due to technological equipment failure or disruption; . Information transmitted may not be sufficient (e.g. poor resolution of images) to allow for appropriate medical decision making by the  Practitioner; and/or  . In rare instances, security protocols could fail, causing a breach of personal health information.  Furthermore, I acknowledge that it is my responsibility to provide information about my medical history, conditions and care that is complete and accurate to the best of my ability. I acknowledge that Practitioner's advice, recommendations, and/or decision may be based on factors not within their control, such as incomplete or inaccurate data provided by me or distortions of diagnostic images or specimens that may result from electronic transmissions. I understand that the practice of medicine is not an exact science and that Practitioner makes no warranties or guarantees regarding treatment outcomes. I acknowledge that a copy of this consent can be made available to me via my patient portal ([facility]), or I can request a printed copy by calling the office of [facility].    I understand that my insurance will be billed for this visit.   I have read or had this consent read to me. . I understand the contents of this consent, which adequately explains the benefits and risks of the Services being provided via telemedicine.  . I have been provided ample opportunity to ask questions regarding this consent and the Services and have had my questions answered to my satisfaction. . I give my informed consent for the services to be provided through the use of telemedicine in my medical care

## 2019-08-12 NOTE — Assessment & Plan Note (Signed)
Multiple interventions.  Now continue to have worsening symptoms concerning for progressive angina.  Last PCI was in August 2020.  Plan:  LEFT HEART CATH WITH NEGATIVE CORONARY ANGIOGRAPHY AND POSSIBLE PCI  Continue Imdur and Ranexa along with increased dose of Toprol  Continue statin, Jardiance and Onglyza

## 2019-08-12 NOTE — H&P (View-Only) (Signed)
Virtual Visit via Video Note   This visit type was conducted due to national recommendations for restrictions regarding the COVID-19 Pandemic (e.g. social distancing) in an effort to limit this patient's exposure and mitigate transmission in our community.  Due to his co-morbid illnesses, this patient is at least at moderate risk for complications without adequate follow up.  This format is felt to be most appropriate for this patient at this time.  All issues noted in this document were discussed and addressed.  A limited physical exam was performed with this format.  Please refer to the patient's chart for his consent to telehealth for [facility] [place].   Patient has given verbal permission to conduct this visit via virtual appointment and to bill insurance 08/12/2019 6:38 PM     Evaluation Performed:  Follow-up visit  Date:  08/12/2019   ID:  John Dodson, DOB 1960/08/21, MRN [id number]  Patient Location: Home Provider Location: Home  PCP:  Kristopher Glee., MD  Cardiologist:  Glenetta Hew, MD  --> Previous Cardiologist Dr. Mathis Bud [facility]) Electrophysiologist:   Dr. Minna Merritts (also [facility] Cards) Nephrologist: Thereasa Distance, MD  Sleep Medicine: Ena Dawley, PA-C  Chief Complaint:   Chief Complaint  Patient presents with  . 3 week follow up    was started on Ranexa 1000 mg twice a day, started warfarin 5 mg daily , stopped aspirin    . Chest Pain    continued chest pain    History of Present Illness:    John Dodson is a 59 y.o. male with PMH notable for significant multivessel disease with PCI now in LAD, OM1, OM 2, L PDA and RCA most recently multiple stents in the RCA in July and August 2020 who presents via audio/video conferencing for a telehealth visit today to discuss ongoing chest pain..   In addition to significant CAD history, he was treated for COVID-19 infection in December 2020 that was then complicated later on by  bilateral PE in January 2020-being treated with warfarin.  John Dodson was just seen on July 15, 2019 to establish care (patient desired a new opinion cardiology).  He noted that he has been having chest pain now for the last couple months, not really feeling well since his PE.  He went to see his primary cardiologist for this complaint, and feels as though he did not get evaluated.  He therefore asked to see a new cardiologist. --> Since he is new patient to me, I wanted to give him the benefit of doubt, but started him on Imdur to see how he did on symptomatic management with plans to see him in close follow-up.  Hospitalizations:  . None since last visit   Recent - Interim CV studies:   The following studies were reviewed today: . No new studies.  Inerval History   John Dodson is being seen today for short follow-up to see how he is doing from a chest pain perspective.  He was started on Imdur last visit, and then he was started on Ranexa 1000 g twice daily by his electrophysiologist at [facility].  He tells me that despite these new medications, he is still having pretty significant chest pain with just about any kind of exertion.  He is not having any resting pain or any pain with just routinely walking around the house, but if he does anything more than walking up a flight of steps or carrying groceries,  he will have to stop because of chest pain.  Quite often elevate take at least 2 nitroglycerin.  As such he is just not really walking around doing much.  He denies any significant dyspnea at rest, but is having exertional dyspnea with his chest discomfort.  No heart failure symptoms of PND, orthopnea or edema.  No claudication.  Additional cardiovascular ROS: positive for - chest pain, dyspnea on exertion, rapid heart rate and He does feel his heart racing a little bit when he starts to exercise. negative for - edema, irregular heartbeat, orthopnea,  paroxysmal nocturnal dyspnea, shortness of breath or Syncope/near syncope, TIA/amaurosis fugax, claudication  ROS:  Please see the history of present illness.    The patient does not have symptoms concerning for COVID-19 infection (fever, chills, cough, or new shortness of breath).  Review of Systems  Constitutional: Positive for malaise/fatigue. Negative for weight loss.  HENT: Negative for nosebleeds.   Respiratory: Negative for shortness of breath.   Gastrointestinal: Negative for blood in stool and melena.  Genitourinary: Negative for dysuria and hematuria.  Musculoskeletal: Negative for falls and joint pain.  Neurological: Positive for dizziness (Sometimes when he is short of breath with discomfort in his chest). Negative for focal weakness and weakness.  Psychiatric/Behavioral: Negative for memory loss. The patient is nervous/anxious. The patient does not have insomnia.    --> Has been waiting for 3 months after his recent infection to get his injection.  He does not want a get injection until after his cath. The patient is practicing social distancing.  Past Medical History:  Diagnosis Date  . Blepharitis of both eyes    Chronic  . Cataracts, both eyes   . CKD (chronic kidney disease) stage 3, GFR 30-59 ml/min    Status post right nephrectomy/adrenalectomy and diabetic nephropathy (baseline creatinine 1.4-1.5)  . Coronary artery disease involving native coronary artery 2015   (No cath report prior to 2016 noted, but as of 2016, had stents in LAD, OM and L PDA) as documented since now in proximal LAD, OM 2-2 stents, L PDA at least 3 if not 4 stents, and also now nondominant RCA.  Marland Kitchen COVID-19 virus infection 04/25/2019  . Diabetic peripheral neuropathy associated with type 2 diabetes mellitus ([place])   . Essential hypertension   . GERD without esophagitis   . History of basal cell carcinoma (BCC) excision   . History of complete heart block 06/2016   Status post pacemaker placement    . History of non-ST elevation myocardial infarction (NSTEMI)    And several occasions of unstable angina  . Hyperlipidemia associated with type 2 diabetes mellitus [facility])    Per PCP note in February 2021, was on atorvastatin 40 mg.  . Obesity (BMI 30.0-34.9)   . Occlusion of left vertebral artery    Consider repossible acute lesion in August 2020 with presentation of headache.  CTA suggested occluded left vertebral artery throughout the neck.  Faint string-like enhancement intermittently visible suggesting recent occlusion.  Partial reconstruction and posterior fossa.  Left PICA patent.  (Consider possible left vertebral artery dissection)  . OSA treated with BiPAP 02/2019   Diagnosis in late 2020 ([facility])--> delayed onset of treatment with BiPAP due to Covid hospitalization followed by PE. ->  BiPAP setting at 21/17 cm  . Pulmonary embolism ([place]) 05/18/2019   (1 month following COVID-19 infection): DVT-PE (bilateral PEs noted on VQ scan)-> started on warfarin.  (On warfarin plus Plavix now with aspirin stopped.)  .  Type 2 diabetes mellitus with complication, with long-term current use of insulin ([place])    On Lantus, Jardiance, Metformin & Onglyza   Recent Hospitalizations:   July 2020: Admitted for cardiac cath for angina-PCI of mid nondominant RCA ? Clinic note November 02, 2018: Noted several months of worsening chest squeezing and shortness of breath radiating to his neck and right arm.  Reproducible walking up flight of steps.  Also noted exertional dyspnea.  August 2020: Admitted with headache and chest pain.   At this time, he noted some chest pain radiating to the jaw with minimal troponin elevation.  Found to have occluded left vertebral artery,   also in cath found to have ostial nondominant RCA treated with another stent.  (Reportedly his 12th stent overall). -->  Patient indicated that this was likely a secondary event.  ER visit November 2020--chest pain thought felt to  be pleuritic in nature.  Was going on all day long.  April 25, 2019: COVID-19 infection  January 17-20, 2021: Admitted for acute hypoxic respite failure (presumed to be bacterial pneumonia-failed outpatient doxycycline) along with DKA.  Found to have BILATERAL PE. ? Found to have D-dimer very elevated along with elevated Ferritin.  Mild troponin elevation.  Started empirically on heparin for PE --> VQ scan ordered because of "contrast allergy " -> bilateral PE identified.  -->  Bridged with Lovenox for therapeutic INR (cost restraints for apixaban) ? Also cover with moxifloxacin for CAP. ? [place]/DKA--required insulin drip ? Off of oxygen prior to discharge.  Past Surgical History:  Procedure Laterality Date  . BASAL CELL CARCINOMA EXCISION  01/2015  . CAROTID DUPLEX SCAN  12/25/2018   [facility]: Mild plaque in both carotids.  139% bilateral.  Right vertebral flow normal antegrade, Left not seen; normal bilateral subclavian flow..  . CHOLECYSTECTOMY    . CORONARY STENT INTERVENTION  04/10/2015   ([place]; Bishop Limbo, DO) -> DES PCI mLPDA: Xience Alpine DES 2.5 mm x 18 mm, Xience Alpine DES 2.25 mm x 12 mm overlapping.  . CORONARY STENT INTERVENTION  04/04/2016   ([place]; [place], MD)--> (urgent) 100% mLPDA PTCA with 2.25 mm balloon - reduced to 50%; mid OM2 90% - DES PCI (Xience DES  2.5 x 18).   . CORONARY STENT INTERVENTION  2015   Prior to December 2016, STENTS noted in prox LAD, proximal OM1, and at least 2 stents in proximal L PDA  . CORONARY STENT INTERVENTION  11/17/2018   ([place]; Bishop Limbo, DO): CULPRIT: mid 90% (DES PCI) -Resolute Onyx DES 2.5 mm x 30 mm --> COMPLICATION: Large left arm hematoma-evaluated by vascular and orthopedic surgery, managed with splint and arm elevation.  . CORONARY STENT INTERVENTION  12/28/2018   ([place]; Clarene Critchley, MD, referred by Dr. Mathis Bud) --> INDICATION  (urgent, Unstable Angina): Moderate-severe (70%) stenosis of ostial RCA  -> DES PCI Resolute Onyx DES 2.5 mm x 12 mm.   Marland Kitchen LEFT HEART CATH AND CORONARY ANGIOGRAPHY  04/10/2015   ([place]; Bishop Limbo, DO)--> EF 55%.  Mild inferior HK.  Coronaries-LM: Normal, p LAD STENT 30% ISR, mLAD 20% & diffuse dLAD ~20%; Dom LCx: mCx 20%, ~pOM1 STENT (noted as OM2 in other reports) patent with mid 30%, OM2 normal, prox LPDA "STENTS" patent with SEVERE mid L PDA 90-95% (DES PCI x 2 overlapping); Small-non-dominant RCA  patent   . LEFT HEART CATH AND CORONARY ANGIOGRAPHY  04/04/2016   [facility]; [place], [place])--> (urgent): EF 70%.  Previous LAD,~OM2 and proximal PDA stents patent; -> mLAD 35%, dLAD 40%; LCx-OM1 75% (short lesion, med Rx), mid OM2 90% (DES PCI), pLPDA stents patent w/ mPDA 100% (PTCA only - reduced to 50%); non-dom RCA - ost RCA 60% & mRCA 75%  . LEFT HEART CATH AND CORONARY ANGIOGRAPHY  11/17/2018   ([place]; Bishop Limbo, DO) indication: Angina.  LM normal; LAD - ost LAD ~50%, pLAD STENT ~30% ISR with dLAD ~40%; Dom LCx - ost & prox Cx 30%, OM1 STENT 20% ISR, OM2 STENT 50% distal edge, pLPDA overlapping STENTS ~20%; nonDom RCA - proxRCA 40%, mid 90% (DES PCI)  . LEFT HEART CATH AND CORONARY ANGIOGRAPHY  12/28/2018   ([place]; Clarene Critchley, MD, referred by Dr. Mathis Bud) --> INDICATION (urgent, Unstable Angina): Moderate-severe (70%) stenosis of ostial RCA (DES PCI), mRCA 30%.  Otherwise no significant change from July 2020: dLM 20%, pLAD ~40% ISR , dLAD long/diffuse ~50%; OstLCx 30%, OM1 ~15% OM2 ~30% with patent  LPDA stents/PTCA site.   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 NM MYOVIEW LTD  08/20/2018   [facility] -> Lexiscan Myoview: EF 81%.  No reversible ischemia or infarction.  Normal wall motion.  Marland Kitchen PACEMAKER IMPLANT  06/2016   [facility] [place] ([employer]) -> according to CT of chest, leads  positioned in right atrium, cardiac apex and coronary sinus (suggesting CRT-P - BiV Pacing))  . TRANSTHORACIC ECHOCARDIOGRAM  11/2018; 05/01/2019   ([place]) a) EF 60-65%.  Mild TR.;; b)moderate concentric LVH.  EF 55 to 60%.  No R WMA.  Normal RV size and function.  Normal atrial sizes.  Normal valves.    **Of note, the patient does have "contrast hypersensitivity "requiring premedication for CT scans and cardiac cath.  Current Meds  Medication Sig  . albuterol (VENTOLIN HFA) 108 (90 Base) MCG/ACT inhaler Inhale 2 puffs into the lungs every 4 (four) hours as needed for wheezing or shortness of breath.  Marland Kitchen atorvastatin (LIPITOR) 40 MG tablet Take 40 mg by mouth daily.  . calcium-vitamin D (OSCAL WITH D) 500-200 MG-UNIT tablet Take 1 tablet by mouth daily. 50 MCG (2000UNIT ) TAB CHOLECALCIFEROL   . clopidogrel (PLAVIX) 75 MG tablet Take 75 mg by mouth daily.  . fluorouracil (EFUDEX) 5 % cream Apply 1 application topically 2 (two) times daily as needed (irritation).   . hydrocortisone 2.5 % lotion Apply 1 application topically 2 (two) times daily as needed (irritation).   . insulin glargine (LANTUS SOLOSTAR) 100 UNIT/ML Solostar Pen Inject 175 Units into the skin every evening. 100UNITS/3 ML   . losartan (COZAAR) 50 MG tablet Take 50 mg by mouth daily.  Marland Kitchen MAGNESIUM OXIDE PO Take 500 mg by mouth daily. TAKE 1  TABLET BY MOUTH NIGHTLY FOR LEG CRAMPS   . metFORMIN (GLUCOPHAGE) 500 MG tablet Take 500 mg by mouth 2 (two) times daily with a meal.  . metoprolol succinate (TOPROL-XL) 100 MG 24 hr tablet Take 1 tablet (100 mg total) by mouth daily. Take with or immediately following a meal.  . NITROSTAT 0.4 MG SL tablet Place 0.4 mg under the tongue every 5 (five) minutes as needed for chest pain.   . Omega-3 Fatty Acids (FISH OIL BURP-LESS) 1000 MG CAPS Take 1,000 mg by mouth 2 (two) times daily.  . pantoprazole (PROTONIX) 40 MG tablet Take 40 mg by mouth 2 (two) times daily before  a meal.    . ranolazine (RANEXA) 1000 MG SR tablet Take 1,000 mg by mouth 2 (two) times daily. Take 1 tablet twice a day  . triamcinolone cream (KENALOG) 0.1 % Apply 1 application topically 2 (two) times daily as needed (irritation).   . VOLTAREN 1 % GEL Apply 2 g topically daily as needed (pain).   Marland Kitchen warfarin (COUMADIN) 5 MG tablet Take 5-7.5 mg by mouth See admin instructions. 5 mg on Tues, 7.5 mg all other days  . [DISCONTINUED] isosorbide mononitrate (IMDUR) 60 MG 24 hr tablet Take 1 tablet (60 mg total) by mouth daily.     Allergies:   Latex, Codeine, Contrast media [iodinated diagnostic agents], Integrilin [eptifibatide], Tylenol [acetaminophen], Zithromax [azithromycin], and Glipizide   Social History   Tobacco Use  . Smoking status: Never Smoker  . Smokeless tobacco: Never Used  Substance Use Topics  . Alcohol use: No  . Drug use: No     Family Hx: The patient's family history includes Coronary artery disease in his father; Hyperlipidemia in his father and mother; Hypertension in his father and mother; Stroke in his mother.   Labs/Other Tests and Data Reviewed:    EKG:  No ECG reviewed.  Recent Labs: No results found for requested labs within last 8760 hours.   Recent Lipid Panel No results found for: CHOL, TRIG, HDL, CHOLHDL, LDLCALC, LDLDIRECT  Last lipids from May 2020-TC 123, TG 139, HDL 33, LDLd -69.  Wt Readings from Last 3 Encounters:  08/12/19 213 lb (96.6 kg)  07/15/19 212 lb (96.2 kg)     Objective:    Vital Signs:  BP (!) 147/68   Pulse 72   Temp (!) 97.3 F (36.3 C)   Ht 5\' 8"  (1.727 m)   Wt 213 lb (96.6 kg)   SpO2 99%   BMI 32.39 kg/m   VITAL SIGNS:  reviewed Well nourished, well developed male in no acute distress. A&O x 3.  Normal Mood & Affect Non-labored respirations Appears relatively comfortable at rest.  ASSESSMENT & PLAN:    Problem List Items Addressed This Visit    Coronary artery disease involving native coronary artery of native  heart with angina pectoris ([place]) - Primary (Chronic)    Multiple interventions.  Now continue to have worsening symptoms concerning for progressive angina.  Last PCI was in August 2020.  Plan:  LEFT HEART CATH WITH NEGATIVE CORONARY ANGIOGRAPHY AND POSSIBLE PCI  Continue Imdur and Ranexa along with increased dose of Toprol  Continue statin, Jardiance and Onglyza      Relevant Medications   ranolazine (RANEXA) 1000 MG SR tablet   warfarin (COUMADIN) 5 MG tablet   Other Relevant Orders   Basic metabolic panel   CBC   Protime-INR   Pulmonary embolism on long-term anticoagulation therapy (HCC) (Chronic)    On warfarin.  Will need to be bridged for cath.  I have asked our clinical pharmacy team to coordinate with his Coumadin clinic to ensure that he is appropriately counseled and given Lovenox bridging instructions. Would simply restart Coumadin post cath      Relevant Medications   ranolazine (RANEXA) 1000 MG SR tablet   warfarin (COUMADIN) 5 MG tablet   Other Relevant Orders   Protime-INR   Hyperlipidemia with target LDL less than 70 (Chronic)    Continues to be on atorvastatin. She will be due for reevaluation likely next month.  Has been stable on current dose of statin.      Relevant  Medications   ranolazine (RANEXA) 1000 MG SR tablet   warfarin (COUMADIN) 5 MG tablet   Essential hypertension (Chronic)    Blood pressure is little high today, but he seems to be relatively uncomfortable.  We can reevaluate at the time of his cath.  We did recently increase his Toprol to 100 mg, we can potentially increase losartan versus add amlodipine and/or diuretic.      Relevant Medications   ranolazine (RANEXA) 1000 MG SR tablet   warfarin (COUMADIN) 5 MG tablet   CAD S/P PCI (Chronic)   Relevant Medications   ranolazine (RANEXA) 1000 MG SR tablet   warfarin (COUMADIN) 5 MG tablet   Other Relevant Orders   Basic metabolic panel   CBC   History of complete heart block (Chronic)      Has been pacemaker placed.  Just recently followed up by EP PA from [facility]      Progressive angina ([place])    At this point, he continues to have chest pain requiring nitroglycerin despite being on pretty much optimal management with Imdur, Ranexa and high-dose Toprol. Only other potential anticoagulant medicine option we have is amlodipine.  At this point, I think the only option we have is to proceed with cardiac catheterization.  He has a stress test scheduled by his electrophysiologist for later this month, but with his ongoing symptoms, I do not think that we can discount his symptoms even if he has a negative stress test because he has multivessel disease involvement.  Plan: We will schedule LEFT HEART CATHETERIZATION WITH NEGATIVE AND CORONARY ANGIOGRAPHY AND POSSIBLE PERCUTANEOUS CORONARY INTERVENTION   CONTINUE IMDUR AND RANEXA AND BETA-BLOCKER  HE WILL NEED TO BRIDGE FROM WARFARIN WITH LOVENOX.  WILL HAVE CVRR PHARMACY TEAM DISCUSS WITH HIS COUMADIN CLINIC THE BEST WAY TO HANDLE THIS.  This required communication between our team and his Coumadin clinic team.  He will also have to cancel his Myoview stress test.  He does have [place] which requires premedication, and only 1 kidney requiring additional precath hydration.  He will be scheduled for second case.      Relevant Medications   ranolazine (RANEXA) 1000 MG SR tablet   warfarin (COUMADIN) 5 MG tablet   predniSONE (DELTASONE) 50 MG tablet   Other Relevant Orders   Basic metabolic panel   CBC   Protime-INR      COVID-19 Education: The signs and symptoms of COVID-19 were discussed with the patient and how to seek care for testing (follow up with PCP or arrange E-visit).   The importance of social distancing was discussed today.  Time:   Today, I have spent 28 minutes with the patient with telehealth technology discussing the above problems.  Additional 8 minutes spent in  charting.   Medication Adjustments/Labs and Tests Ordered: Current medicines are reviewed at length with the patient today.  Concerns regarding medicines are outlined above.   Patient Instructions  Medication Instructions:   Since we will be planning to do a heart catheterization next Friday (April 23) -> I will have my clinical pharmacy team touch base with your Coumadin clinic to determine how we appropriately have you hold your warfarin and bridged with Lovenox injections.  *If you need a refill on your cardiac medications before your next appointment, please call your pharmacy*   Lab Work:  Precath labs- CBC, BMP, PT /INR  If you have labs (blood work) drawn today and your tests are completely normal, you will receive your  results only by: Marland Kitchen MyChart Message (if you have MyChart) OR . A paper copy in the mail If you have any lab test that is abnormal or we need to change your treatment, we will call you to review the results.   Testing/Procedures:  LEFT HEART CATHETERIZATION CORONARY ANGIOGRAPHY AND POSSIBLE PERCUTANEOUS CORONARY INTERVENTION- Your physician has requested that you have a cardiac catheterization. Cardiac catheterization is used to diagnose and/or treat various heart conditions. Doctors may recommend this procedure for a number of different reasons. The most common reason is to evaluate chest pain. Chest pain can be a symptom of coronary artery disease (CAD), and cardiac catheterization can show whether plaque is narrowing or blocking your heart's arteries. This procedure is also used to evaluate the valves, as well as measure the blood flow and oxygen levels in different parts of your heart. For further information please visit [email or web address].[email or web address]. Please follow instruction sheet, as given.    Follow-Up: At [facility], you and your health needs are our priority.  As part of our continuing mission to provide you with exceptional heart care, we have created  designated Provider Care Teams.  These Care Teams include your primary Cardiologist (physician) and Advanced Practice Providers (APPs -  Physician Assistants and Nurse Practitioners) who all work together to provide you with the care you need, when you need it.  We recommend signing up for the patient portal called "MyChart".  Sign up information is provided on this After Visit Summary.  MyChart is used to connect with patients for Virtual Visits (Telemedicine).  Patients are able to view lab/test results, encounter notes, upcoming appointments, etc.  Non-urgent messages can be sent to your provider as well.   To learn more about what you can do with MyChart, go to [email or web address].    Your next appointment:   3 week(s)   The format for your next appointment:   In Person  Provider:   You may see Glenetta Hew, MD or one of the following Advanced Practice Providers on your designated Care Team:    Rosaria Ferries, PA-C  Jory Sims, DNP, ANP  Cadence Kathlen Mody, NP  Maryfrances Bunnell    Other Instructions Between now and the time of your cath, I would like for you to try to stay relatively sedentary.  Do not try to do any exertional activity.  If you have your chest pain symptoms at rest, and it does not go away after 1 nitroglycerin you need to get to the emergency room at [place] [address] Dept: [place]: [place]  08/12/2019  You are scheduled for a Cardiac Catheterization on Friday, April 23 with Dr. Glenetta Hew.  1. Please arrive at the [facility] (Main Entrance A) at [facility]: [address] [place], [place] [address] at 5:30 AM (This time is two hours before your procedure to ensure your preparation). Free valet parking service is available.   Special note: Every effort is  made to have your procedure done on time. Please understand that emergencies sometimes delay scheduled procedures.  2. Diet: Do not eat solid foods after midnight.  The patient may have clear liquids until 5am upon the day of the procedure.  3. Labs: You will need to have blood drawn ( BMP, CBC,PT-INR on Tuesday, April 20 at [place]  Open: 8am - 5pm (Lunch 12:30 - 1:30)   Phone: [phone]. You do not need to be fasting. Please go to [place] for Covid testing after lab work is completed at [place] will need to self quarantine until catheterization on Friday April 23 ,2021.    4. Medication instructions in preparation for your procedure:   Contrast Allergy: Yes, Please take Prednisone 50mg  by mouth at: Thirteen hours prior to cath 8:00pm on Thursday,April 22,2021 Seven hours prior to cath 2:00am on Friday,April 23,2021 And prior to leaving home please take last dose of Prednisone 50mg  and Benadryl 50mg  by mouth.  Instruction will be given to you on when to stop Warfarin and start Lovenox  ( by K. Vannoy PA) from [facility].  Take only ___35_ units of insulin the night before your procedure. Do not take any insulin on the day of the procedure.  Do not take Diabetes Med Glucophage (Metformin) on the day of the procedure and HOLD 48 HOURS AFTER THE PROCEDURE.  On the morning of your procedure, take  Aspirin 325 mg  and Plavix/Clopidogrel 75 mg  and any morning medicines NOT listed above.  You may use sips of water.  5. Plan for one night stay--bring personal belongings. 6. Bring a current list of your medications and current insurance cards. 7. You MUST have a responsible person to drive you home. 8. Someone MUST be with you the first 24 hours after you arrive home or your discharge will be delayed. 9. Please wear clothes that are easy to get on and off and wear slip-on shoes.  Thank you for allowing [place] to care for  you!   -- [facility] In[facility] Cardi[facility] services      Signed, Glenetta Hew, MD  08/12/2019 6:38 PM    [place];

## 2019-08-12 NOTE — Telephone Encounter (Signed)
Spoke to patient. Detailed  Instructions given for upcoming cardiac cath  ( sent instruction as well through mychart message)   patient verbalized understanding .  Dates of  Labs , covid testing given  Patient is aware of medication   Prophylactic medication ,   Aware to arrive at hospital 5:30 am  , ( to be hydrated per Dr Ellyn Hack)   lovenox bridging will be handle by  [facility]ology - [place]. ( arranged by  [facility] pharmacist)    office note will   faxed to  [phone] ( phone [phone])   Per request , [facility]

## 2019-08-12 NOTE — Telephone Encounter (Signed)
RN spoke to patient. Instruction were given  from today's virtual visit 08/12/19 .  AVS SUMMARY has been sent by mychart . Instruction for cardiac cath on 4 06/02/19   Recall May 5 ,2021   Patient verbalized understanding

## 2019-08-12 NOTE — Assessment & Plan Note (Signed)
Blood pressure is little high today, but he seems to be relatively uncomfortable.  We can reevaluate at the time of his cath.  We did recently increase his Toprol to 100 mg, we can potentially increase losartan versus add amlodipine and/or diuretic.

## 2019-08-12 NOTE — Assessment & Plan Note (Signed)
Has been pacemaker placed.  Just recently followed up by EP PA from [facility]

## 2019-08-12 NOTE — Assessment & Plan Note (Signed)
On warfarin.  Will need to be bridged for cath.  I have asked our clinical pharmacy team to coordinate with his Coumadin clinic to ensure that he is appropriately counseled and given Lovenox bridging instructions. Would simply restart Coumadin post cath

## 2019-08-12 NOTE — Telephone Encounter (Signed)
Called to start  Video/virtual  -  Went to voice mail  1st attempt  Will call back

## 2019-08-12 NOTE — Patient Instructions (Addendum)
Medication Instructions:   Since we will be planning to do a heart catheterization next Friday (April 23) -> I will have my clinical pharmacy team touch base with your Coumadin clinic to determine how we appropriately have you hold your warfarin and bridged with Lovenox injections.  *If you need a refill on your cardiac medications before your next appointment, please call your pharmacy*   Lab Work:  Precath labs- CBC, BMP, PT /INR  If you have labs (blood work) drawn today and your tests are completely normal, you will receive your results only by: Marland Kitchen MyChart Message (if you have MyChart) OR . A paper copy in the mail If you have any lab test that is abnormal or we need to change your treatment, we will call you to review the results.   Testing/Procedures:  LEFT HEART CATHETERIZATION CORONARY ANGIOGRAPHY AND POSSIBLE PERCUTANEOUS CORONARY INTERVENTION- Your physician has requested that you have a cardiac catheterization. Cardiac catheterization is used to diagnose and/or treat various heart conditions. Doctors may recommend this procedure for a number of different reasons. The most common reason is to evaluate chest pain. Chest pain can be a symptom of coronary artery disease (CAD), and cardiac catheterization can show whether plaque is narrowing or blocking your heart's arteries. This procedure is also used to evaluate the valves, as well as measure the blood flow and oxygen levels in different parts of your heart. For further information please visit [email or web address]. Please follow instruction sheet, as given.    Follow-Up: At [facility], you and your health needs are our priority.  As part of our continuing mission to provide you with exceptional heart care, we have created designated Provider Care Teams.  These Care Teams include your primary Cardiologist (physician) and Advanced Practice Providers (APPs -  Physician Assistants and Nurse Practitioners) who all work together to  provide you with the care you need, when you need it.  We recommend signing up for the patient portal called "MyChart".  Sign up information is provided on this After Visit Summary.  MyChart is used to connect with patients for Virtual Visits (Telemedicine).  Patients are able to view lab/test results, encounter notes, upcoming appointments, etc.  Non-urgent messages can be sent to your provider as well.   To learn more about what you can do with MyChart, go to [email or web address].    Your next appointment:   3 week(s)   The format for your next appointment:   In Person  Provider:   You may see Glenetta Hew, MD or one of the following Advanced Practice Providers on your designated Care Team:    Rosaria Ferries, PA-C  Jory Sims, DNP, ANP  Cadence Kathlen Mody, NP  Maryfrances Bunnell    Other Instructions Between now and the time of your cath, I would like for you to try to stay relatively sedentary.  Do not try to do any exertional activity.  If you have your chest pain symptoms at rest, and it does not go away after 1 nitroglycerin you need to get to the emergency room at [place] [address] Dept: [place]: [place]  08/12/2019  You are scheduled for a Cardiac Catheterization on Friday, April 23 with Dr. Glenetta Hew.  1. Please arrive at the [facility] (Main Entrance A) at [facility]: [address] [place], [place] [address]  at 5:30 AM (This time is two hours before your procedure to ensure your preparation). Free valet parking service is available.   Special note: Every effort is made to have your procedure done on time. Please understand that emergencies sometimes delay scheduled procedures.  2. Diet: Do not eat solid foods after midnight.  The patient may have clear liquids until  5am upon the day of the procedure.  3. Labs: You will need to have blood drawn ( BMP, CBC,PT-INR on Tuesday, April 20 at [place] [place]  Open: 8am - 5pm (Lunch 12:30 - 1:30)   Phone: [phone]. You do not need to be fasting. Please go to [place] for Covid testing after lab work is completed at [place] will need to self quarantine until catheterization on Friday April 23 ,2021.    4. Medication instructions in preparation for your procedure:   Contrast Allergy: Yes, Please take Prednisone 50mg  by mouth at: Thirteen hours prior to cath 8:00pm on Thursday,April 22,2021 Seven hours prior to cath 2:00am on Friday,April 23,2021 And prior to leaving home please take last dose of Prednisone 50mg  and Benadryl 50mg  by mouth.  Instruction will be given to you on when to stop Warfarin and start Lovenox  ( by K. Vannoy PA) from [facility].  Take only ___35_ units of insulin the night before your procedure. Do not take any insulin on the day of the procedure.  Do not take Diabetes Med Glucophage (Metformin) on the day of the procedure and HOLD 48 HOURS AFTER THE PROCEDURE.  On the morning of your procedure, take  Aspirin 325 mg  and Plavix/Clopidogrel 75 mg  and any morning medicines NOT listed above.  You may use sips of water.  5. Plan for one night stay--bring personal belongings. 6. Bring a current list of your medications and current insurance cards. 7. You MUST have a responsible person to drive you home. 8. Someone MUST be with you the first 24 hours after you arrive home or your discharge will be delayed. 9. Please wear clothes that are easy to get on and off and wear slip-on shoes.  Thank you for allowing [place] to care for you!   -- [facility]

## 2019-08-12 NOTE — Progress Notes (Signed)
Virtual Visit via Video Note   This visit type was conducted due to national recommendations for restrictions regarding the COVID-19 Pandemic (e.g. social distancing) in an effort to limit this patient's exposure and mitigate transmission in our community.  Due to his co-morbid illnesses, this patient is at least at moderate risk for complications without adequate follow up.  This format is felt to be most appropriate for this patient at this time.  All issues noted in this document were discussed and addressed.  A limited physical exam was performed with this format.  Please refer to the patient's chart for his consent to telehealth for [facility].   Patient has given verbal permission to conduct this visit via virtual appointment and to bill insurance 08/12/2019 6:38 PM     Evaluation Performed:  Follow-up visit  Date:  08/12/2019   ID:  John Dodson, DOB 09-22-1960, MRN [id number]  Patient Location: Home Provider Location: Home  PCP:  Kristopher Glee., MD  Cardiologist:  Glenetta Hew, MD  --> Previous Cardiologist Dr. Mathis Bud [facility]) Electrophysiologist:   Dr. Minna Merritts (also [facility]) Nephrologist: Thereasa Distance, MD  Sleep Medicine: Ena Dawley, PA-C  Chief Complaint:   Chief Complaint  Patient presents with  . 3 week follow up    was started on Ranexa 1000 mg twice a day, started warfarin 5 mg daily , stopped aspirin    . Chest Pain    continued chest pain    History of Present Illness:    John Dodson is a 59 y.o. male with PMH notable for significant multivessel disease with PCI now in LAD, OM1, OM 2, L PDA and RCA most recently multiple stents in the RCA in July and August 2020 who presents via audio/video conferencing for a telehealth visit today to discuss ongoing chest pain..   In addition to significant CAD history, he was treated for COVID-19 infection in December 2020 that was then complicated later on by  bilateral PE in January 2020-being treated with warfarin.  John Dodson was just seen on July 15, 2019 to establish care (patient desired a new opinion cardiology).  He noted that he has been having chest pain now for the last couple months, not really feeling well since his PE.  He went to see his primary cardiologist for this complaint, and feels as though he did not get evaluated.  He therefore asked to see a new cardiologist. --> Since he is new patient to me, I wanted to give him the benefit of doubt, but started him on Imdur to see how he did on symptomatic management with plans to see him in close follow-up.  Hospitalizations:  . None since last visit   Recent - Interim CV studies:   The following studies were reviewed today: . No new studies.  Inerval History   John Dodson is being seen today for short follow-up to see how he is doing from a chest pain perspective.  He was started on Imdur last visit, and then he was started on Ranexa 1000 g twice daily by his electrophysiologist at [facility].  He tells me that despite these new medications, he is still having pretty significant chest pain with just about any kind of exertion.  He is not having any resting pain or any pain with just routinely walking around the house, but if he does anything more than walking up a flight of steps or carrying groceries,  he will have to stop because of chest pain.  Quite often elevate take at least 2 nitroglycerin.  As such he is just not really walking around doing much.  He denies any significant dyspnea at rest, but is having exertional dyspnea with his chest discomfort.  No heart failure symptoms of PND, orthopnea or edema.  No claudication.  Additional cardiovascular ROS: positive for - chest pain, dyspnea on exertion, rapid heart rate and He does feel his heart racing a little bit when he starts to exercise. negative for - edema, irregular heartbeat, orthopnea,  paroxysmal nocturnal dyspnea, shortness of breath or Syncope/near syncope, TIA/amaurosis fugax, claudication  ROS:  Please see the history of present illness.    The patient does not have symptoms concerning for COVID-19 infection (fever, chills, cough, or new shortness of breath).  Review of Systems  Constitutional: Positive for malaise/fatigue. Negative for weight loss.  HENT: Negative for nosebleeds.   Respiratory: Negative for shortness of breath.   Gastrointestinal: Negative for blood in stool and melena.  Genitourinary: Negative for dysuria and hematuria.  Musculoskeletal: Negative for falls and joint pain.  Neurological: Positive for dizziness (Sometimes when he is short of breath with discomfort in his chest). Negative for focal weakness and weakness.  Psychiatric/Behavioral: Negative for memory loss. The patient is nervous/anxious. The patient does not have insomnia.    --> Has been waiting for 3 months after his recent infection to get his injection.  He does not want a get injection until after his cath. The patient is practicing social distancing.  Past Medical History:  Diagnosis Date  . Blepharitis of both eyes    Chronic  . Cataracts, both eyes   . CKD (chronic kidney disease) stage 3, GFR 30-59 ml/min    Status post right nephrectomy/adrenalectomy and diabetic nephropathy (baseline creatinine 1.4-1.5)  . Coronary artery disease involving native coronary artery 2015   (No cath report prior to 2016 noted, but as of 2016, had stents in LAD, OM and L PDA) as documented since now in proximal LAD, OM 2-2 stents, L PDA at least 3 if not 4 stents, and also now nondominant RCA.  Marland Kitchen COVID-19 virus infection 04/25/2019  . Diabetic peripheral neuropathy associated with type 2 diabetes mellitus ([place])   . Essential hypertension   . GERD without esophagitis   . History of basal cell carcinoma (BCC) excision   . History of complete heart block 06/2016   Status post pacemaker placement    . History of non-ST elevation myocardial infarction (NSTEMI)    And several occasions of unstable angina  . Hyperlipidemia associated with type 2 diabetes mellitus [facility])    Per PCP note in February 2021, was on atorvastatin 40 mg.  . Obesity (BMI 30.0-34.9)   . Occlusion of left vertebral artery    Consider repossible acute lesion in August 2020 with presentation of headache.  CTA suggested occluded left vertebral artery throughout the neck.  Faint string-like enhancement intermittently visible suggesting recent occlusion.  Partial reconstruction and posterior fossa.  Left PICA patent.  (Consider possible left vertebral artery dissection)  . OSA treated with BiPAP 02/2019   Diagnosis in late 2020 ([facility])--> delayed onset of treatment with BiPAP due to Covid hospitalization followed by PE. ->  BiPAP setting at 21/17 cm  . Pulmonary embolism ([place]) 05/18/2019   (1 month following COVID-19 infection): DVT-PE (bilateral PEs noted on VQ scan)-> started on warfarin.  (On warfarin plus Plavix now with aspirin stopped.)  .  Type 2 diabetes mellitus with complication, with long-term current use of insulin ([place])    On Lantus, Jardiance, Metformin & Onglyza   Recent Hospitalizations:   July 2020: Admitted for cardiac cath for angina-PCI of mid nondominant RCA ? Clinic note November 02, 2018: Noted several months of worsening chest squeezing and shortness of breath radiating to his neck and right arm.  Reproducible walking up flight of steps.  Also noted exertional dyspnea.  August 2020: Admitted with headache and chest pain.   At this time, he noted some chest pain radiating to the jaw with minimal troponin elevation.  Found to have occluded left vertebral artery,   also in cath found to have ostial nondominant RCA treated with another stent.  (Reportedly his 12th stent overall). -->  Patient indicated that this was likely a secondary event.  ER visit November 2020--chest pain thought felt to  be pleuritic in nature.  Was going on all day long.  April 25, 2019: COVID-19 infection  January 17-20, 2021: Admitted for acute hypoxic respite failure (presumed to be bacterial pneumonia-failed outpatient doxycycline) along with DKA.  Found to have BILATERAL PE. ? Found to have D-dimer very elevated along with elevated Ferritin.  Mild troponin elevation.  Started empirically on heparin for PE --> VQ scan ordered because of "contrast allergy " -> bilateral PE identified.  -->  Bridged with Lovenox for therapeutic INR (cost restraints for apixaban) ? Also cover with moxifloxacin for CAP. ? [place]/DKA--required insulin drip ? Off of oxygen prior to discharge.  Past Surgical History:  Procedure Laterality Date  . BASAL CELL CARCINOMA EXCISION  01/2015  . CAROTID DUPLEX SCAN  12/25/2018   [facility]: Mild plaque in both carotids.  139% bilateral.  Right vertebral flow normal antegrade, Left not seen; normal bilateral subclavian flow..  . CHOLECYSTECTOMY    . CORONARY STENT INTERVENTION  04/10/2015   ([place]; Bishop Limbo, DO) -> DES PCI mLPDA: Xience Alpine DES 2.5 mm x 18 mm, Xience Alpine DES 2.25 mm x 12 mm overlapping.  . CORONARY STENT INTERVENTION  04/04/2016   ([place]; [place], MD)--> (urgent) 100% mLPDA PTCA with 2.25 mm balloon - reduced to 50%; mid OM2 90% - DES PCI (Xience DES  2.5 x 18).   . CORONARY STENT INTERVENTION  2015   Prior to December 2016, STENTS noted in prox LAD, proximal OM1, and at least 2 stents in proximal L PDA  . CORONARY STENT INTERVENTION  11/17/2018   ([place]; Bishop Limbo, DO): CULPRIT: mid 90% (DES PCI) -Resolute Onyx DES 2.5 mm x 30 mm --> COMPLICATION: Large left arm hematoma-evaluated by vascular and orthopedic surgery, managed with splint and arm elevation.  . CORONARY STENT INTERVENTION  12/28/2018   ([place]; Clarene Critchley, MD, referred by Dr. Mathis Bud) --> INDICATION  (urgent, Unstable Angina): Moderate-severe (70%) stenosis of ostial RCA  -> DES PCI Resolute Onyx DES 2.5 mm x 12 mm.   Marland Kitchen LEFT HEART CATH AND CORONARY ANGIOGRAPHY  04/10/2015   ([place]; Bishop Limbo, DO)--> EF 55%.  Mild inferior HK.  Coronaries-LM: Normal, p LAD STENT 30% ISR, mLAD 20% & diffuse dLAD ~20%; Dom LCx: mCx 20%, ~pOM1 STENT (noted as OM2 in other reports) patent with mid 30%, OM2 normal, prox LPDA "STENTS" patent with SEVERE mid L PDA 90-95% (DES PCI x 2 overlapping); Small-non-dominant RCA  patent   . LEFT HEART CATH AND CORONARY ANGIOGRAPHY  04/04/2016   [facility] [place]; [place], [place])--> (urgent): EF 70%.  Previous LAD,~OM2 and proximal PDA stents patent; -> mLAD 35%, dLAD 40%; LCx-OM1 75% (short lesion, med Rx), mid OM2 90% (DES PCI), pLPDA stents patent w/ mPDA 100% (PTCA only - reduced to 50%); non-dom RCA - ost RCA 60% & mRCA 75%  . LEFT HEART CATH AND CORONARY ANGIOGRAPHY  11/17/2018   ([place]; Bishop Limbo, DO) indication: Angina.  LM normal; LAD - ost LAD ~50%, pLAD STENT ~30% ISR with dLAD ~40%; Dom LCx - ost & prox Cx 30%, OM1 STENT 20% ISR, OM2 STENT 50% distal edge, pLPDA overlapping STENTS ~20%; nonDom RCA - proxRCA 40%, mid 90% (DES PCI)  . LEFT HEART CATH AND CORONARY ANGIOGRAPHY  12/28/2018   ([place]; Clarene Critchley, MD, referred by Dr. Mathis Bud) --> INDICATION (urgent, Unstable Angina): Moderate-severe (70%) stenosis of ostial RCA (DES PCI), mRCA 30%.  Otherwise no significant change from July 2020: dLM 20%, pLAD ~40% ISR , dLAD long/diffuse ~50%; OstLCx 30%, OM1 ~15% OM2 ~30% with patent  LPDA stents/PTCA site.   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 NM MYOVIEW LTD  08/20/2018   [facility] -> Lexiscan Myoview: EF 81%.  No reversible ischemia or infarction.  Normal wall motion.  Marland Kitchen PACEMAKER IMPLANT  06/2016   [facility], [place] ([employer]) -> according to CT of chest, leads  positioned in right atrium, cardiac apex and coronary sinus (suggesting CRT-P - BiV Pacing))  . TRANSTHORACIC ECHOCARDIOGRAM  11/2018; 05/01/2019   ([place]) a) EF 60-65%.  Mild TR.;; b)moderate concentric LVH.  EF 55 to 60%.  No R WMA.  Normal RV size and function.  Normal atrial sizes.  Normal valves.    **Of note, the patient does have "contrast hypersensitivity "requiring premedication for CT scans and cardiac cath.  Current Meds  Medication Sig  . albuterol (VENTOLIN HFA) 108 (90 Base) MCG/ACT inhaler Inhale 2 puffs into the lungs every 4 (four) hours as needed for wheezing or shortness of breath.  Marland Kitchen atorvastatin (LIPITOR) 40 MG tablet Take 40 mg by mouth daily.  . calcium-vitamin D (OSCAL WITH D) 500-200 MG-UNIT tablet Take 1 tablet by mouth daily. 50 MCG (2000UNIT ) TAB CHOLECALCIFEROL   . clopidogrel (PLAVIX) 75 MG tablet Take 75 mg by mouth daily.  . fluorouracil (EFUDEX) 5 % cream Apply 1 application topically 2 (two) times daily as needed (irritation).   . hydrocortisone 2.5 % lotion Apply 1 application topically 2 (two) times daily as needed (irritation).   . insulin glargine (LANTUS SOLOSTAR) 100 UNIT/ML Solostar Pen Inject 175 Units into the skin every evening. 100UNITS/3 ML   . losartan (COZAAR) 50 MG tablet Take 50 mg by mouth daily.  Marland Kitchen MAGNESIUM OXIDE PO Take 500 mg by mouth daily. TAKE 1  TABLET BY MOUTH NIGHTLY FOR LEG CRAMPS   . metFORMIN (GLUCOPHAGE) 500 MG tablet Take 500 mg by mouth 2 (two) times daily with a meal.  . metoprolol succinate (TOPROL-XL) 100 MG 24 hr tablet Take 1 tablet (100 mg total) by mouth daily. Take with or immediately following a meal.  . NITROSTAT 0.4 MG SL tablet Place 0.4 mg under the tongue every 5 (five) minutes as needed for chest pain.   . Omega-3 Fatty Acids (FISH OIL BURP-LESS) 1000 MG CAPS Take 1,000 mg by mouth 2 (two) times daily.  . pantoprazole (PROTONIX) 40 MG tablet Take 40 mg by mouth 2 (two) times daily before  a meal.    . ranolazine (RANEXA) 1000 MG SR tablet Take 1,000 mg by mouth 2 (two) times daily. Take 1 tablet twice a day  . triamcinolone cream (KENALOG) 0.1 % Apply 1 application topically 2 (two) times daily as needed (irritation).   . VOLTAREN 1 % GEL Apply 2 g topically daily as needed (pain).   Marland Kitchen warfarin (COUMADIN) 5 MG tablet Take 5-7.5 mg by mouth See admin instructions. 5 mg on Tues, 7.5 mg all other days  . [DISCONTINUED] isosorbide mononitrate (IMDUR) 60 MG 24 hr tablet Take 1 tablet (60 mg total) by mouth daily.     Allergies:   Latex, Codeine, Contrast media [iodinated diagnostic agents], Integrilin [eptifibatide], Tylenol [acetaminophen], Zithromax [azithromycin], and Glipizide   Social History   Tobacco Use  . Smoking status: Never Smoker  . Smokeless tobacco: Never Used  Substance Use Topics  . Alcohol use: No  . Drug use: No     Family Hx: The patient's family history includes Coronary artery disease in his father; Hyperlipidemia in his father and mother; Hypertension in his father and mother; Stroke in his mother.   Labs/Other Tests and Data Reviewed:    EKG:  No ECG reviewed.  Recent Labs: No results found for requested labs within last 8760 hours.   Recent Lipid Panel No results found for: CHOL, TRIG, HDL, CHOLHDL, LDLCALC, LDLDIRECT  Last lipids from May 2020-TC 123, TG 139, HDL 33, LDLd -69.  Wt Readings from Last 3 Encounters:  08/12/19 213 lb (96.6 kg)  07/15/19 212 lb (96.2 kg)     Objective:    Vital Signs:  BP (!) 147/68   Pulse 72   Temp (!) 97.3 F (36.3 C)   Ht 5\' 8"  (1.727 m)   Wt 213 lb (96.6 kg)   SpO2 99%   BMI 32.39 kg/m   VITAL SIGNS:  reviewed Well nourished, well developed male in no acute distress. A&O x 3.  Normal Mood & Affect Non-labored respirations Appears relatively comfortable at rest.  ASSESSMENT & PLAN:    Problem List Items Addressed This Visit    Coronary artery disease involving native coronary artery of native  heart with angina pectoris ([place]) - Primary (Chronic)    Multiple interventions.  Now continue to have worsening symptoms concerning for progressive angina.  Last PCI was in August 2020.  Plan:  LEFT HEART CATH WITH NEGATIVE CORONARY ANGIOGRAPHY AND POSSIBLE PCI  Continue Imdur and Ranexa along with increased dose of Toprol  Continue statin, Jardiance and Onglyza      Relevant Medications   ranolazine (RANEXA) 1000 MG SR tablet   warfarin (COUMADIN) 5 MG tablet   Other Relevant Orders   Basic metabolic panel   CBC   Protime-INR   Pulmonary embolism on long-term anticoagulation therapy (HCC) (Chronic)    On warfarin.  Will need to be bridged for cath.  I have asked our clinical pharmacy team to coordinate with his Coumadin clinic to ensure that he is appropriately counseled and given Lovenox bridging instructions. Would simply restart Coumadin post cath      Relevant Medications   ranolazine (RANEXA) 1000 MG SR tablet   warfarin (COUMADIN) 5 MG tablet   Other Relevant Orders   Protime-INR   Hyperlipidemia with target LDL less than 70 (Chronic)    Continues to be on atorvastatin. She will be due for reevaluation likely next month.  Has been stable on current dose of statin.      Relevant  Medications   ranolazine (RANEXA) 1000 MG SR tablet   warfarin (COUMADIN) 5 MG tablet   Essential hypertension (Chronic)    Blood pressure is little high today, but he seems to be relatively uncomfortable.  We can reevaluate at the time of his cath.  We did recently increase his Toprol to 100 mg, we can potentially increase losartan versus add amlodipine and/or diuretic.      Relevant Medications   ranolazine (RANEXA) 1000 MG SR tablet   warfarin (COUMADIN) 5 MG tablet   CAD S/P PCI (Chronic)   Relevant Medications   ranolazine (RANEXA) 1000 MG SR tablet   warfarin (COUMADIN) 5 MG tablet   Other Relevant Orders   Basic metabolic panel   CBC   History of complete heart block (Chronic)      Has been pacemaker placed.  Just recently followed up by EP [facility] from [facility]      Progressive angina ([place])    At this point, he continues to have chest pain requiring nitroglycerin despite being on pretty much optimal management with Imdur, Ranexa and high-dose Toprol. Only other potential anticoagulant medicine option we have is amlodipine.  At this point, I think the only option we have is to proceed with cardiac catheterization.  He has a stress test scheduled by his electrophysiologist for later this month, but with his ongoing symptoms, I do not think that we can discount his symptoms even if he has a negative stress test because he has multivessel disease involvement.  Plan: We will schedule LEFT HEART CATHETERIZATION WITH NEGATIVE AND CORONARY ANGIOGRAPHY AND POSSIBLE PERCUTANEOUS CORONARY INTERVENTION   CONTINUE IMDUR AND RANEXA AND BETA-BLOCKER  HE WILL NEED TO BRIDGE FROM WARFARIN WITH LOVENOX.  WILL HAVE CVRR PHARMACY TEAM DISCUSS WITH HIS COUMADIN CLINIC THE BEST WAY TO HANDLE THIS.  This required communication between our team and his Coumadin clinic team.  He will also have to cancel his Myoview stress test.  He does have [place] which requires premedication, and only 1 kidney requiring additional precath hydration.  He will be scheduled for second case.      Relevant Medications   ranolazine (RANEXA) 1000 MG SR tablet   warfarin (COUMADIN) 5 MG tablet   predniSONE (DELTASONE) 50 MG tablet   Other Relevant Orders   Basic metabolic panel   CBC   Protime-INR      COVID-19 Education: The signs and symptoms of COVID-19 were discussed with the patient and how to seek care for testing (follow up with PCP or arrange E-visit).   The importance of social distancing was discussed today.  Time:   Today, I have spent 28 minutes with the patient with telehealth technology discussing the above problems.  Additional 8 minutes spent in  charting.   Medication Adjustments/Labs and Tests Ordered: Current medicines are reviewed at length with the patient today.  Concerns regarding medicines are outlined above.   Patient Instructions  Medication Instructions:   Since we will be planning to do a heart catheterization next Friday (April 23) -> I will have my clinical pharmacy team touch base with your Coumadin clinic to determine how we appropriately have you hold your warfarin and bridged with Lovenox injections.  *If you need a refill on your cardiac medications before your next appointment, please call your pharmacy*   Lab Work:  Precath labs- CBC, BMP, PT /INR  If you have labs (blood work) drawn today and your tests are completely normal, you will receive your  results only by: Marland Kitchen MyChart Message (if you have MyChart) OR . A paper copy in the mail If you have any lab test that is abnormal or we need to change your treatment, we will call you to review the results.   Testing/Procedures:  LEFT HEART CATHETERIZATION CORONARY ANGIOGRAPHY AND POSSIBLE PERCUTANEOUS CORONARY INTERVENTION- Your physician has requested that you have a cardiac catheterization. Cardiac catheterization is used to diagnose and/or treat various heart conditions. Doctors may recommend this procedure for a number of different reasons. The most common reason is to evaluate chest pain. Chest pain can be a symptom of coronary artery disease (CAD), and cardiac catheterization can show whether plaque is narrowing or blocking your heart's arteries. This procedure is also used to evaluate the valves, as well as measure the blood flow and oxygen levels in different parts of your heart. For further information please visit HugeFiesta.[email or web address]. Please follow instruction sheet, as given.    Follow-Up: At [facility], you and your health needs are our priority.  As part of our continuing mission to provide you with exceptional heart care, we have created  designated Provider Care Teams.  These Care Teams include your primary Cardiologist (physician) and Advanced Practice Providers (APPs -  Physician Assistants and Nurse Practitioners) who all work together to provide you with the care you need, when you need it.  We recommend signing up for the patient portal called "MyChart".  Sign up information is provided on this After Visit Summary.  MyChart is used to connect with patients for Virtual Visits (Telemedicine).  Patients are able to view lab/test results, encounter notes, upcoming appointments, etc.  Non-urgent messages can be sent to your provider as well.   To learn more about what you can do with MyChart, go to [email or web address].    Your next appointment:   3 week(s)   The format for your next appointment:   In Person  Provider:   You may see Glenetta Hew, MD or one of the following Advanced Practice Providers on your designated Care Team:    Rosaria Ferries, PA-C  Jory Sims, DNP, ANP  Cadence Kathlen Mody, NP  Maryfrances Bunnell    Other Instructions Between now and the time of your cath, I would like for you to try to stay relatively sedentary.  Do not try to do any exertional activity.  If you have your chest pain symptoms at rest, and it does not go away after 1 nitroglycerin you need to get to the emergency room at [place] [address] Dept: [employer]: [place]  08/12/2019  You are scheduled for a Cardiac Catheterization on Friday, April 23 with Dr. Glenetta Hew.  1. Please arrive at the [facility] (Main Entrance A) at [facility]: [address] [place], [place] [address] at 5:30 AM (This time is two hours before your procedure to ensure your preparation). Free valet parking service is available.   Special note: Every effort is  made to have your procedure done on time. Please understand that emergencies sometimes delay scheduled procedures.  2. Diet: Do not eat solid foods after midnight.  The patient may have clear liquids until 5am upon the day of the procedure.  3. Labs: You will need to have blood drawn ( BMP, CBC,PT-INR on Tuesday, April 20 at [place]  Open: 8am - 5pm (Lunch 12:30 - 1:30)   Phone: [phone]. You do not need to be fasting. Please go to [place] for Covid testing after lab work is completed at [place] will need to self quarantine until catheterization on Friday April 23 ,2021.    4. Medication instructions in preparation for your procedure:   Contrast Allergy: Yes, Please take Prednisone 50mg  by mouth at: Thirteen hours prior to cath 8:00pm on Thursday,April 22,2021 Seven hours prior to cath 2:00am on Friday,April 23,2021 And prior to leaving home please take last dose of Prednisone 50mg  and Benadryl 50mg  by mouth.  Instruction will be given to you on when to stop Warfarin and start Lovenox  ( by K. Vannoy PA) from [facility].  Take only ___35_ units of insulin the night before your procedure. Do not take any insulin on the day of the procedure.  Do not take Diabetes Med Glucophage (Metformin) on the day of the procedure and HOLD 48 HOURS AFTER THE PROCEDURE.  On the morning of your procedure, take  Aspirin 325 mg  and Plavix/Clopidogrel 75 mg  and any morning medicines NOT listed above.  You may use sips of water.  5. Plan for one night stay--bring personal belongings. 6. Bring a current list of your medications and current insurance cards. 7. You MUST have a responsible person to drive you home. 8. Someone MUST be with you the first 24 hours after you arrive home or your discharge will be delayed. 9. Please wear clothes that are easy to get on and off and wear slip-on shoes.  Thank you for allowing [place] to care for  you!   -- [facility] Invasive Cardiovascular services      Signed, Glenetta Hew, MD  08/12/2019 6:38 PM    [place];

## 2019-08-17 ENCOUNTER — Other Ambulatory Visit (HOSPITAL_COMMUNITY)
Admission: RE | Admit: 2019-08-17 | Discharge: 2019-08-17 | Disposition: A | Discharge status: Home / Self Care | Payer: Medicare Other | Source: Ambulatory Visit | Attending: Cardiology | Admitting: Cardiology

## 2019-08-17 DIAGNOSIS — Z01812 Encounter for preprocedural laboratory examination: Secondary | ICD-10-CM | POA: Insufficient documentation

## 2019-08-17 DIAGNOSIS — Z20822 Contact with and (suspected) exposure to covid-19: Secondary | ICD-10-CM | POA: Insufficient documentation

## 2019-08-17 LAB — CBC
Hematocrit: 43.6 % (ref 37.5–51.0)
Hemoglobin: 14 g/dL (ref 13.0–17.7)
MCH: 27.1 pg (ref 26.6–33.0)
MCHC: 32.1 g/dL (ref 31.5–35.7)
MCV: 85 fL (ref 79–97)
Platelets: 149 10*3/uL — ABNORMAL LOW (ref 150–450)
RBC: 5.16 x10E6/uL (ref 4.14–5.80)
RDW: 14.7 % (ref 11.6–15.4)
WBC: 5.4 10*3/uL (ref 3.4–10.8)

## 2019-08-17 LAB — BASIC METABOLIC PANEL
BUN/Creatinine Ratio: 19 (ref 9–20)
BUN: 23 mg/dL (ref 6–24)
CO2: 24 mmol/L (ref 20–29)
Calcium: 9.6 mg/dL (ref 8.7–10.2)
Chloride: 102 mmol/L (ref 96–106)
Creatinine, Ser: 1.18 mg/dL (ref 0.76–1.27)
GFR calc Af Amer: 79 mL/min/{1.73_m2} (ref >59)
GFR calc non Af Amer: 68 mL/min/{1.73_m2} (ref >59)
Glucose: 119 mg/dL — ABNORMAL HIGH (ref 65–99)
Potassium: 4.7 mmol/L (ref 3.5–5.2)
Sodium: 140 mmol/L (ref 134–144)

## 2019-08-17 LAB — PROTIME-INR
INR: 1.3 — ABNORMAL HIGH (ref 0.9–1.2)
Prothrombin Time: 14.1 s — ABNORMAL HIGH (ref 9.1–12.0)

## 2019-08-17 LAB — SARS CORONAVIRUS 2 (TAT 6-24 HRS): SARS Coronavirus 2: NEGATIVE

## 2019-08-20 ENCOUNTER — Inpatient Hospital Stay (HOSPITAL_COMMUNITY)
Admission: AD | Admit: 2019-08-20 | Discharge: 2019-08-31 | DRG: 234 | Disposition: A | Discharge status: Home / Self Care | Payer: Medicare Other | Attending: Cardiothoracic Surgery | Admitting: Cardiothoracic Surgery

## 2019-08-20 ENCOUNTER — Other Ambulatory Visit: Payer: Self-pay | Admitting: *Deleted

## 2019-08-20 ENCOUNTER — Other Ambulatory Visit: Payer: Self-pay

## 2019-08-20 ENCOUNTER — Encounter (HOSPITAL_COMMUNITY)
Admission: AD | Disposition: A | Discharge status: Home / Self Care | Payer: Self-pay | Source: Home / Self Care | Attending: Cardiothoracic Surgery

## 2019-08-20 DIAGNOSIS — E1122 Type 2 diabetes mellitus with diabetic chronic kidney disease: Secondary | ICD-10-CM | POA: Diagnosis present

## 2019-08-20 DIAGNOSIS — Z86711 Personal history of pulmonary embolism: Secondary | ICD-10-CM | POA: Diagnosis not present

## 2019-08-20 DIAGNOSIS — Z955 Presence of coronary angioplasty implant and graft: Secondary | ICD-10-CM

## 2019-08-20 DIAGNOSIS — Z8616 Personal history of COVID-19: Secondary | ICD-10-CM

## 2019-08-20 DIAGNOSIS — I200 Unstable angina: Secondary | ICD-10-CM | POA: Diagnosis not present

## 2019-08-20 DIAGNOSIS — Z7901 Long term (current) use of anticoagulants: Secondary | ICD-10-CM

## 2019-08-20 DIAGNOSIS — Z905 Acquired absence of kidney: Secondary | ICD-10-CM | POA: Diagnosis not present

## 2019-08-20 DIAGNOSIS — E7849 Other hyperlipidemia: Secondary | ICD-10-CM | POA: Diagnosis present

## 2019-08-20 DIAGNOSIS — Z85828 Personal history of other malignant neoplasm of skin: Secondary | ICD-10-CM

## 2019-08-20 DIAGNOSIS — Z8349 Family history of other endocrine, nutritional and metabolic diseases: Secondary | ICD-10-CM

## 2019-08-20 DIAGNOSIS — I252 Old myocardial infarction: Secondary | ICD-10-CM

## 2019-08-20 DIAGNOSIS — I361 Nonrheumatic tricuspid (valve) insufficiency: Secondary | ICD-10-CM | POA: Diagnosis not present

## 2019-08-20 DIAGNOSIS — Z885 Allergy status to narcotic agent status: Secondary | ICD-10-CM | POA: Diagnosis not present

## 2019-08-20 DIAGNOSIS — E896 Postprocedural adrenocortical (-medullary) hypofunction: Secondary | ICD-10-CM | POA: Diagnosis present

## 2019-08-20 DIAGNOSIS — Z91040 Latex allergy status: Secondary | ICD-10-CM

## 2019-08-20 DIAGNOSIS — I2510 Atherosclerotic heart disease of native coronary artery without angina pectoris: Secondary | ICD-10-CM

## 2019-08-20 DIAGNOSIS — Z950 Presence of cardiac pacemaker: Secondary | ICD-10-CM | POA: Diagnosis not present

## 2019-08-20 DIAGNOSIS — I129 Hypertensive chronic kidney disease with stage 1 through stage 4 chronic kidney disease, or unspecified chronic kidney disease: Secondary | ICD-10-CM | POA: Diagnosis present

## 2019-08-20 DIAGNOSIS — Z01818 Encounter for other preprocedural examination: Secondary | ICD-10-CM

## 2019-08-20 DIAGNOSIS — Z794 Long term (current) use of insulin: Secondary | ICD-10-CM | POA: Diagnosis not present

## 2019-08-20 DIAGNOSIS — Z7902 Long term (current) use of antithrombotics/antiplatelets: Secondary | ICD-10-CM

## 2019-08-20 DIAGNOSIS — Z86718 Personal history of other venous thrombosis and embolism: Secondary | ICD-10-CM

## 2019-08-20 DIAGNOSIS — I25119 Atherosclerotic heart disease of native coronary artery with unspecified angina pectoris: Secondary | ICD-10-CM | POA: Diagnosis present

## 2019-08-20 DIAGNOSIS — Z951 Presence of aortocoronary bypass graft: Secondary | ICD-10-CM

## 2019-08-20 DIAGNOSIS — G4733 Obstructive sleep apnea (adult) (pediatric): Secondary | ICD-10-CM | POA: Diagnosis present

## 2019-08-20 DIAGNOSIS — G4730 Sleep apnea, unspecified: Secondary | ICD-10-CM | POA: Diagnosis present

## 2019-08-20 DIAGNOSIS — Z8249 Family history of ischemic heart disease and other diseases of the circulatory system: Secondary | ICD-10-CM

## 2019-08-20 DIAGNOSIS — K219 Gastro-esophageal reflux disease without esophagitis: Secondary | ICD-10-CM | POA: Diagnosis present

## 2019-08-20 DIAGNOSIS — J9811 Atelectasis: Secondary | ICD-10-CM

## 2019-08-20 DIAGNOSIS — E1142 Type 2 diabetes mellitus with diabetic polyneuropathy: Secondary | ICD-10-CM | POA: Diagnosis present

## 2019-08-20 DIAGNOSIS — Z886 Allergy status to analgesic agent status: Secondary | ICD-10-CM

## 2019-08-20 DIAGNOSIS — Z79899 Other long term (current) drug therapy: Secondary | ICD-10-CM | POA: Diagnosis not present

## 2019-08-20 DIAGNOSIS — I25110 Atherosclerotic heart disease of native coronary artery with unstable angina pectoris: Principal | ICD-10-CM | POA: Diagnosis present

## 2019-08-20 DIAGNOSIS — Z91041 Radiographic dye allergy status: Secondary | ICD-10-CM

## 2019-08-20 DIAGNOSIS — Z881 Allergy status to other antibiotic agents status: Secondary | ICD-10-CM

## 2019-08-20 DIAGNOSIS — E785 Hyperlipidemia, unspecified: Secondary | ICD-10-CM | POA: Diagnosis not present

## 2019-08-20 DIAGNOSIS — E119 Type 2 diabetes mellitus without complications: Secondary | ICD-10-CM | POA: Diagnosis not present

## 2019-08-20 DIAGNOSIS — N183 Chronic kidney disease, stage 3 unspecified: Secondary | ICD-10-CM | POA: Diagnosis present

## 2019-08-20 DIAGNOSIS — Z9889 Other specified postprocedural states: Secondary | ICD-10-CM

## 2019-08-20 DIAGNOSIS — I214 Non-ST elevation (NSTEMI) myocardial infarction: Secondary | ICD-10-CM | POA: Insufficient documentation

## 2019-08-20 DIAGNOSIS — I314 Cardiac tamponade: Secondary | ICD-10-CM | POA: Diagnosis not present

## 2019-08-20 DIAGNOSIS — Z888 Allergy status to other drugs, medicaments and biological substances status: Secondary | ICD-10-CM

## 2019-08-20 DIAGNOSIS — Z01810 Encounter for preprocedural cardiovascular examination: Secondary | ICD-10-CM | POA: Diagnosis not present

## 2019-08-20 DIAGNOSIS — R0602 Shortness of breath: Secondary | ICD-10-CM

## 2019-08-20 HISTORY — PX: LEFT HEART CATH AND CORONARY ANGIOGRAPHY: CATH118249

## 2019-08-20 LAB — GLUCOSE, CAPILLARY
Glucose-Capillary: 271 mg/dL — ABNORMAL HIGH (ref 70–99)
Glucose-Capillary: 196 mg/dL — ABNORMAL HIGH (ref 70–99)
Glucose-Capillary: 330 mg/dL — ABNORMAL HIGH (ref 70–99)
Glucose-Capillary: 320 mg/dL — ABNORMAL HIGH (ref 70–99)

## 2019-08-20 LAB — PROTIME-INR
INR: 1 (ref 0.8–1.2)
Prothrombin Time: 12.8 seconds (ref 11.4–15.2)

## 2019-08-20 LAB — POCT ACTIVATED CLOTTING TIME: Activated Clotting Time: 263 seconds

## 2019-08-20 SURGERY — LEFT HEART CATH AND CORONARY ANGIOGRAPHY
Anesthesia: LOCAL

## 2019-08-20 MED ORDER — MIDAZOLAM HCL 2 MG/2ML IJ SOLN
INTRAMUSCULAR | Status: DC | PRN
  Administered 2019-08-20: 2 mg via INTRAVENOUS

## 2019-08-20 MED ORDER — ALBUTEROL SULFATE (2.5 MG/3ML) 0.083% IN NEBU: 2.5000 mg | INHALATION_SOLUTION | RESPIRATORY_TRACT | Status: DC | PRN

## 2019-08-20 MED ORDER — PANTOPRAZOLE SODIUM 40 MG PO TBEC
40.0000 mg | DELAYED_RELEASE_TABLET | Freq: Two times a day (BID) | ORAL | Status: DC
  Administered 2019-08-20 – 2019-08-24 (×8): 40 mg via ORAL
  Filled 2019-08-20 (×9): qty 1

## 2019-08-20 MED ORDER — SODIUM CHLORIDE 0.9% FLUSH
3.0000 mL | Freq: Two times a day (BID) | INTRAVENOUS | Status: DC
  Administered 2019-08-20 – 2019-08-23 (×7): 3 mL via INTRAVENOUS

## 2019-08-20 MED ORDER — ALBUTEROL SULFATE HFA 108 (90 BASE) MCG/ACT IN AERS: 2.0000 | INHALATION_SPRAY | RESPIRATORY_TRACT | Status: DC | PRN

## 2019-08-20 MED ORDER — MORPHINE SULFATE (PF) 4 MG/ML IV SOLN: 4.0000 mg | INTRAVENOUS | Status: DC | PRN

## 2019-08-20 MED ORDER — IOHEXOL 350 MG/ML SOLN
INTRAVENOUS | Status: DC | PRN
  Administered 2019-08-20: 165 mL

## 2019-08-20 MED ORDER — HEPARIN SODIUM (PORCINE) 1000 UNIT/ML IJ SOLN
INTRAMUSCULAR | Status: DC | PRN
  Administered 2019-08-20: 9000 [IU] via INTRAVENOUS

## 2019-08-20 MED ORDER — MIDAZOLAM HCL 2 MG/2ML IJ SOLN
INTRAMUSCULAR | Status: AC
  Filled 2019-08-20: qty 2

## 2019-08-20 MED ORDER — OMEGA-3-ACID ETHYL ESTERS 1 G PO CAPS
1000.0000 mg | ORAL_CAPSULE | Freq: Two times a day (BID) | ORAL | Status: DC
  Administered 2019-08-21 – 2019-08-24 (×8): 1000 mg via ORAL
  Filled 2019-08-20 (×8): qty 1

## 2019-08-20 MED ORDER — FLUOROURACIL 5 % EX CREA: 1.0000 "application " | TOPICAL_CREAM | Freq: Two times a day (BID) | CUTANEOUS | Status: DC | PRN

## 2019-08-20 MED ORDER — ONDANSETRON HCL 4 MG/2ML IJ SOLN: 4.0000 mg | Freq: Four times a day (QID) | INTRAMUSCULAR | Status: DC | PRN

## 2019-08-20 MED ORDER — SODIUM CHLORIDE 0.9 % IV SOLN: INTRAVENOUS | Status: AC

## 2019-08-20 MED ORDER — NITROGLYCERIN IN D5W 200-5 MCG/ML-% IV SOLN
INTRAVENOUS | Status: AC | PRN
  Administered 2019-08-20: 15 ug/min via INTRAVENOUS

## 2019-08-20 MED ORDER — LIDOCAINE HCL (PF) 1 % IJ SOLN
INTRAMUSCULAR | Status: DC | PRN
  Administered 2019-08-20: 15 mL

## 2019-08-20 MED ORDER — METOPROLOL TARTRATE 5 MG/5ML IV SOLN
INTRAVENOUS | Status: AC
  Filled 2019-08-20: qty 5

## 2019-08-20 MED ORDER — MAGNESIUM OXIDE 400 (241.3 MG) MG PO TABS
600.0000 mg | ORAL_TABLET | Freq: Every day | ORAL | Status: DC
  Administered 2019-08-21 – 2019-08-31 (×10): 600 mg via ORAL
  Filled 2019-08-20 (×10): qty 2

## 2019-08-20 MED ORDER — ENOXAPARIN SODIUM 100 MG/ML ~~LOC~~ SOLN
100.0000 mg | Freq: Two times a day (BID) | SUBCUTANEOUS | Status: DC
  Administered 2019-08-20 – 2019-08-24 (×8): 100 mg via SUBCUTANEOUS
  Filled 2019-08-20 (×8): qty 1

## 2019-08-20 MED ORDER — LOSARTAN POTASSIUM 50 MG PO TABS
50.0000 mg | ORAL_TABLET | Freq: Every day | ORAL | Status: DC
  Administered 2019-08-21 – 2019-08-23 (×3): 50 mg via ORAL
  Filled 2019-08-20 (×2): qty 1

## 2019-08-20 MED ORDER — NITROGLYCERIN 0.4 MG SL SUBL
SUBLINGUAL_TABLET | SUBLINGUAL | Status: AC
  Administered 2019-08-20: 12:00:00 0.4 mg via SUBLINGUAL
  Filled 2019-08-20: qty 1

## 2019-08-20 MED ORDER — LABETALOL HCL 5 MG/ML IV SOLN
INTRAVENOUS | Status: DC | PRN
  Administered 2019-08-20: 10 mg via INTRAVENOUS

## 2019-08-20 MED ORDER — NITROGLYCERIN IN D5W 200-5 MCG/ML-% IV SOLN
INTRAVENOUS | Status: AC
  Filled 2019-08-20: qty 250

## 2019-08-20 MED ORDER — RANOLAZINE ER 500 MG PO TB12
1000.0000 mg | ORAL_TABLET | Freq: Two times a day (BID) | ORAL | Status: DC
  Administered 2019-08-20 – 2019-08-24 (×9): 1000 mg via ORAL
  Filled 2019-08-20 (×9): qty 2

## 2019-08-20 MED ORDER — MELATONIN 5 MG PO TABS
5.0000 mg | ORAL_TABLET | Freq: Every evening | ORAL | Status: DC | PRN
  Administered 2019-08-20 – 2019-08-29 (×5): 5 mg via ORAL
  Filled 2019-08-20 (×6): qty 1

## 2019-08-20 MED ORDER — INSULIN GLARGINE 100 UNIT/ML SOLOSTAR PEN: 150.0000 [IU] | PEN_INJECTOR | Freq: Every evening | SUBCUTANEOUS | Status: DC

## 2019-08-20 MED ORDER — SODIUM CHLORIDE 0.9 % IV SOLN: 250.0000 mL | INTRAVENOUS | Status: DC | PRN

## 2019-08-20 MED ORDER — METOPROLOL TARTRATE 5 MG/5ML IV SOLN
INTRAVENOUS | Status: DC | PRN
  Administered 2019-08-20: 5 mg via INTRAVENOUS

## 2019-08-20 MED ORDER — NITROGLYCERIN 0.4 MG SL SUBL
0.4000 mg | SUBLINGUAL_TABLET | SUBLINGUAL | Status: DC | PRN
  Administered 2019-08-20 (×2): 0.4 mg via SUBLINGUAL

## 2019-08-20 MED ORDER — INSULIN GLARGINE 100 UNIT/ML ~~LOC~~ SOLN
70.0000 [IU] | Freq: Every evening | SUBCUTANEOUS | Status: DC
  Administered 2019-08-20 – 2019-08-24 (×4): 70 [IU] via SUBCUTANEOUS
  Filled 2019-08-20 (×5): qty 0.7

## 2019-08-20 MED ORDER — HEPARIN (PORCINE) IN NACL 1000-0.9 UT/500ML-% IV SOLN
INTRAVENOUS | Status: AC
  Filled 2019-08-20: qty 1000

## 2019-08-20 MED ORDER — SODIUM CHLORIDE 0.9 % WEIGHT BASED INFUSION
1.0000 mL/kg/h | INTRAVENOUS | Status: DC
  Administered 2019-08-20: 1 mL/kg/h via INTRAVENOUS

## 2019-08-20 MED ORDER — NITROGLYCERIN 1 MG/10 ML FOR IR/CATH LAB
INTRA_ARTERIAL | Status: AC
  Filled 2019-08-20: qty 10

## 2019-08-20 MED ORDER — SODIUM CHLORIDE 0.9% FLUSH: 3.0000 mL | INTRAVENOUS | Status: DC | PRN

## 2019-08-20 MED ORDER — HYDRALAZINE HCL 20 MG/ML IJ SOLN: 10.0000 mg | INTRAMUSCULAR | Status: AC | PRN

## 2019-08-20 MED ORDER — FENTANYL CITRATE (PF) 100 MCG/2ML IJ SOLN
INTRAMUSCULAR | Status: AC
  Filled 2019-08-20: qty 2

## 2019-08-20 MED ORDER — FENTANYL CITRATE (PF) 100 MCG/2ML IJ SOLN
INTRAMUSCULAR | Status: DC | PRN
  Administered 2019-08-20 (×2): 50 ug via INTRAVENOUS

## 2019-08-20 MED ORDER — INSULIN ASPART 100 UNIT/ML ~~LOC~~ SOLN
0.0000 [IU] | Freq: Three times a day (TID) | SUBCUTANEOUS | Status: DC
  Administered 2019-08-21: 17:00:00 7 [IU] via SUBCUTANEOUS
  Administered 2019-08-21 – 2019-08-22 (×2): 3 [IU] via SUBCUTANEOUS
  Administered 2019-08-22: 17:00:00 4 [IU] via SUBCUTANEOUS
  Administered 2019-08-23: 13:00:00 3 [IU] via SUBCUTANEOUS
  Administered 2019-08-23: 17:00:00 7 [IU] via SUBCUTANEOUS
  Administered 2019-08-23: 08:00:00 3 [IU] via SUBCUTANEOUS
  Administered 2019-08-24: 7 [IU] via SUBCUTANEOUS

## 2019-08-20 MED ORDER — LABETALOL HCL 5 MG/ML IV SOLN
INTRAVENOUS | Status: AC
  Filled 2019-08-20: qty 4

## 2019-08-20 MED ORDER — HEPARIN SODIUM (PORCINE) 1000 UNIT/ML IJ SOLN
INTRAMUSCULAR | Status: AC
  Filled 2019-08-20: qty 1

## 2019-08-20 MED ORDER — HEPARIN (PORCINE) IN NACL 1000-0.9 UT/500ML-% IV SOLN
INTRAVENOUS | Status: DC | PRN
  Administered 2019-08-20 (×2): 500 mL

## 2019-08-20 MED ORDER — HYDROCORTISONE 1 % EX CREA
1.0000 "application " | TOPICAL_CREAM | Freq: Two times a day (BID) | CUTANEOUS | Status: DC | PRN
  Filled 2019-08-20: qty 28

## 2019-08-20 MED ORDER — NITROGLYCERIN 0.4 MG SL SUBL
0.4000 mg | SUBLINGUAL_TABLET | SUBLINGUAL | Status: DC | PRN
  Administered 2019-08-23 (×2): 0.4 mg via SUBLINGUAL
  Filled 2019-08-20 (×2): qty 1

## 2019-08-20 MED ORDER — ACETAMINOPHEN 325 MG PO TABS
650.0000 mg | ORAL_TABLET | ORAL | Status: DC | PRN
  Administered 2019-08-20 – 2019-08-21 (×2): 650 mg via ORAL
  Filled 2019-08-20 (×2): qty 2

## 2019-08-20 MED ORDER — LABETALOL HCL 5 MG/ML IV SOLN: 10.0000 mg | INTRAVENOUS | Status: AC | PRN

## 2019-08-20 MED ORDER — ATORVASTATIN CALCIUM 40 MG PO TABS
40.0000 mg | ORAL_TABLET | Freq: Every day | ORAL | Status: DC
  Administered 2019-08-20 – 2019-08-31 (×11): 40 mg via ORAL
  Filled 2019-08-20 (×10): qty 1

## 2019-08-20 MED ORDER — METOPROLOL SUCCINATE ER 100 MG PO TB24
100.0000 mg | ORAL_TABLET | Freq: Every day | ORAL | Status: DC
  Administered 2019-08-21 – 2019-08-24 (×4): 100 mg via ORAL
  Filled 2019-08-20 (×4): qty 1

## 2019-08-20 MED ORDER — SODIUM CHLORIDE 0.9 % WEIGHT BASED INFUSION
3.0000 mL/kg/h | INTRAVENOUS | Status: DC
  Administered 2019-08-20: 06:00:00 3 mL/kg/h via INTRAVENOUS

## 2019-08-20 MED ORDER — SODIUM CHLORIDE 0.9 % IV SOLN: INTRAVENOUS | Status: DC

## 2019-08-20 MED ORDER — NITROGLYCERIN 0.4 MG SL SUBL: 0.4000 mg | SUBLINGUAL_TABLET | SUBLINGUAL | Status: DC | PRN

## 2019-08-20 MED ORDER — NITROGLYCERIN IN D5W 200-5 MCG/ML-% IV SOLN: 0.0000 ug/min | INTRAVENOUS | Status: DC

## 2019-08-20 SURGICAL SUPPLY — 17 items
CATH INFINITI 5 FR 3DRC (CATHETERS) ×1 IMPLANT
CATH INFINITI 5 FR AR1 MOD (CATHETERS) ×1 IMPLANT
CATH INFINITI 5FR MULTPACK ANG (CATHETERS) ×1 IMPLANT
CATH LAUNCHER 5F EBU4.0 (CATHETERS) ×1 IMPLANT
CATH LAUNCHER 5F NOTO (CATHETERS) IMPLANT
CATHETER LAUNCHER 5F NOTO (CATHETERS) ×2
CLOSURE MYNX CONTROL 5F (Vascular Products) ×1 IMPLANT
ELECT DEFIB PAD ADLT CADENCE (PAD) ×1 IMPLANT
GUIDEWIRE PRESSURE COMET II (WIRE) ×1 IMPLANT
KIT ESSENTIALS PG (KITS) ×1 IMPLANT
KIT HEART LEFT (KITS) ×2 IMPLANT
PACK CARDIAC CATHETERIZATION (CUSTOM PROCEDURE TRAY) ×2 IMPLANT
SHEATH PINNACLE 5F 10CM (SHEATH) ×1 IMPLANT
SHEATH PROBE COVER 6X72 (BAG) ×1 IMPLANT
TRANSDUCER W/STOPCOCK (MISCELLANEOUS) ×2 IMPLANT
TUBING CIL FLEX 10 FLL-RA (TUBING) ×2 IMPLANT
WIRE EMERALD 3MM-J .035X150CM (WIRE) ×1 IMPLANT

## 2019-08-20 NOTE — Progress Notes (Signed)
Patient arrived from cath lab. Transferred from stretcher to bed with steady gait. Right groin site level 0, dressing dry and intact, right pedal pulse +2. Patient oriented to unit, verbalized good understanding. Denies any pain at this time. IV NS infusing at 75 mls/hr and Nitroglycerin infusion at 20 mcg. Report to [facility].

## 2019-08-20 NOTE — Interval H&P Note (Signed)
History and Physical Interval Note:  08/20/2019 1:18 PM  John Dodson  has presented today for surgery, with the diagnosis of Angina.    While in the waiting area, procedure was delayed due to 2 back-to-back ST elevation MI patients.  The patient was seen by Dr. Daneen Schick.  His note is as follows.  John Dodson is a 59 y.o. male with PMH notable for significant multivessel disease with PCI now in LAD, OM1, OM 2, L PDA and RCA most recently multiple stents in the RCA in July and August 2020. Since having COVID-19 disease in December he has had increasing episodes of prolonged angina.  He is to have diagnostic catheterization today by Dr. Ellyn Hack. I was called to short stay because of significant chest discomfort radiating to left arm unresponsive to 2 nitroglycerin tablets.  A third nitroglycerin tablet has brought about significant relief but not complete. EKG demonstrates pacing with right bundle appearance, unchanged from 318/2021. On exam he is warm, dry, and has good skin color.  No significant murmur is heard.  No JVD is noted.  Lungs are clear. Impression: Patient with extensive CAD history and multiple stents, most recently in August 2020 with progressive angina since December 2020.  Current episode is significantly improved after 3 sublingual nitroglycerin.  There is mild residual discomfort. PLAN: I have called the Cath Lab to have him be on deck for the next slot to evaluate his coronary status.  He will moved to the holding area where IV nitroglycerin will be started at 15 mcg/min.   The various methods of treatment have been discussed with the patient and family. After consideration of risks, benefits and other options for treatment, the patient has consented to  Procedure(s): LEFT HEART CATH AND CORONARY ANGIOGRAPHY (N/A)  PERCUTANEOUS CORONARY INTERVENTION  as a surgical intervention.  The patient's history has been reviewed, patient examined, no change in status,  stable for surgery.  I have reviewed the patient's chart and labs.  Questions were answered to the patient's satisfaction.    Cath Lab Visit (complete for each Cath Lab visit)  Clinical Evaluation Leading to the Procedure:   ACS: Yes.    Non-ACS:    Anginal Classification: CCS IV  Anti-ischemic medical therapy: Maximal Therapy (2 or more classes of medications)  Non-Invasive Test Results: No non-invasive testing performed  Prior CABG: No previous CABG   Glenetta Hew

## 2019-08-20 NOTE — Brief Op Note (Signed)
08/20/2019  3:36 PM  PATIENT:  John Dodson  59 y.o. male with an extensive CAD PMH  Past Surgical History:  Procedure Laterality Date  . BASAL CELL CARCINOMA EXCISION  01/2015  . CAROTID DUPLEX SCAN  12/25/2018   [facility]: Mild plaque in both carotids.  139% bilateral.  Right vertebral flow normal antegrade, Left not seen; normal bilateral subclavian flow..  . CHOLECYSTECTOMY    . CORONARY STENT INTERVENTION  04/10/2015   ([place]; Bishop Limbo, DO) -> DES PCI mLPDA: Xience Alpine DES 2.5 mm x 18 mm, Xience Alpine DES 2.25 mm x 12 mm overlapping.  . CORONARY STENT INTERVENTION  04/04/2016   ([place]; [place], MD)--> (urgent) 100% mLPDA PTCA with 2.25 mm balloon - reduced to 50%; mid OM2 90% - DES PCI (Xience DES  2.5 x 18).   . CORONARY STENT INTERVENTION  2015   Prior to December 2016, STENTS noted in prox LAD, proximal OM1, and at least 2 stents in proximal L PDA  . CORONARY STENT INTERVENTION  11/17/2018   ([place]; Bishop Limbo, DO): CULPRIT: mid 90% (DES PCI) -Resolute Onyx DES 2.5 mm x 30 mm --> COMPLICATION: Large left arm hematoma-evaluated by vascular and orthopedic surgery, managed with splint and arm elevation.  . CORONARY STENT INTERVENTION  12/28/2018   ([place]; Clarene Critchley, MD, referred by Dr. Mathis Bud) --> INDICATION (urgent, Unstable Angina): Moderate-severe (70%) stenosis of ostial RCA  -> DES PCI Resolute Onyx DES 2.5 mm x 12 mm.   Marland Kitchen LEFT HEART CATH AND CORONARY ANGIOGRAPHY  04/10/2015   ([place]; Bishop Limbo, DO)--> EF 55%.  Mild inferior HK.  Coronaries-LM: Normal, p LAD STENT 30% ISR, mLAD 20% & diffuse dLAD ~20%; Dom LCx: mCx 20%, ~pOM1 STENT (noted as OM2 in other reports) patent with mid 30%, OM2 normal, prox LPDA "STENTS" patent with SEVERE mid L PDA 90-95% (DES PCI x 2 overlapping); Small-non-dominant RCA  patent   . LEFT HEART CATH AND CORONARY ANGIOGRAPHY   04/04/2016   ([place]; [place], MD)--> (urgent): EF 70%.  Previous LAD,~OM2 and proximal PDA stents patent; -> mLAD 35%, dLAD 40%; LCx-OM1 75% (short lesion, med Rx), mid OM2 90% (DES PCI), pLPDA stents patent w/ mPDA 100% (PTCA only - reduced to 50%); non-dom RCA - ost RCA 60% & mRCA 75%  . LEFT HEART CATH AND CORONARY ANGIOGRAPHY  11/17/2018   ([place]; Bishop Limbo, DO) indication: Angina.  LM normal; LAD - ost LAD ~50%, pLAD STENT ~30% ISR with dLAD ~40%; Dom LCx - ost & prox Cx 30%, OM1 STENT 20% ISR, OM2 STENT 50% distal edge, pLPDA overlapping STENTS ~20%; nonDom RCA - proxRCA 40%, mid 90% (DES PCI)  . LEFT HEART CATH AND CORONARY ANGIOGRAPHY  12/28/2018   ([place]; Clarene Critchley, MD, referred by Dr. Mathis Bud) --> INDICATION (urgent, Unstable Angina): Moderate-severe (70%) stenosis of ostial RCA (DES PCI), mRCA 30%.  Otherwise no significant change from July 2020: dLM 20%, pLAD ~40% ISR , dLAD long/diffuse ~50%; OstLCx 30%, OM1 ~15% OM2 ~30% with patent  LPDA stents/PTCA site.   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 NM MYOVIEW LTD  08/20/2018   [facility] -> Lexiscan Myoview: EF 81%.  No reversible ischemia or infarction.  Normal wall motion.  Marland Kitchen PACEMAKER IMPLANT  06/2016   [facility] [place] ([employer]) -> according to CT of chest, leads positioned in  right atrium, cardiac apex and coronary sinus (suggesting CRT-P - BiV Pacing))  . TRANSTHORACIC ECHOCARDIOGRAM  11/2018; 05/01/2019   ([place]) a) EF 60-65%.  Mild TR.;; b)moderate concentric LVH.  EF 55 to 60%.  No R WMA.  Normal RV size and function.  Normal atrial sizes.  Normal valves.   Who was seen as a 2nd Opinion consult for progressive angina Sx.  Sx did not improve with Nitrates & Ranexa.  He is referred for Cardiac Cath.  The patient was having resting chest pain relieved with nitroglycerin while in the waiting room.  Was  started on nitroglycerin drip prior to arrival to the Cath Lab.  PRE-OPERATIVE DIAGNOSIS:  Progressive AnginaAngina  POST-OPERATIVE DIAGNOSIS:  .  Multivessel CAD with essentially patent stents in proximal to mid LAD, proximal OM1, proximal LPL1, several stents in the L PDA as well as RCA that is a small nondominant vessel.  No obvious lesions within stents noted.  Ostial LCx roughly 60% --> highly DFR positive with 0.84  Long mid LAD 60% after stent--highly DFR +0.67-0.73  Normal LVEDP  PROCEDURE:  Procedure(s): LEFT HEART CATH AND CORONARY ANGIOGRAPHY (N/A) --> 5 Fr RFA Sheath (US Guided with Fluroscopy)   5Fr catheters advanced exchanged over J-wire ->  LCA angiography: JL4  RCA angiography JR4 with some images, then after multiple different catheters, 3 DRC used.  (Unable to engage with AR-1, no torque catheters)  Angiography did not reveal any significant obvious lesions however the ostial circumflex did appear to be potentially 60+ percent in some images, and the midportion of the LAD had an extensive 60% 6 area.  I decided then evaluate with DFR. IV heparin administered  DFR: (Diastolic flow reserve) 5 [place] EBU guide catheter --> COMET wire zeroed then equalized in the proximal LAD.  DFR first of the LCx followed by LAD performed  LCx DFR 0.83-0.4 (highly significant  DFR LAD after proximal stents -0.67-0.73.  Highly significant.  RFA angiography revealed adequate placement for closure device.  RFA arteriotomy closed with Mynx closure device.  Hemostasis obtained.  SURGEON:  Surgeon(s) and Role:    * Leonie Man, MD - Primary  PHYSICIAN ASSISTANT:   ASSISTANTS: none; images reviewed with Dr. Tamala Julian  ANESTHESIA:   local and IV sedation 4 mg Versed, 100 mcg fentanyl  EBL:  <20 mL   MEDICATIONS USED: Versed 5 mL contrast  DICTATION: .Note written in EPIC  PLAN OF CARE: Admit to inpatient -> CVTS consultation called.; we will use enoxaparin while inpatient  to bridge while he is off of warfarin.  Hold Plavix.  Continue home medications, but will use reduced dose of 150 units glargine insulin with sliding scale.  We will attempt to restart Ranexa however he indicated there may have been a reaction.  Will monitor closely.  We will check 2D echocardiogram over the weekend.  PATIENT DISPOSITION:  PACU - hemodynamically stable.   Delay start of Pharmacological VTE agent (>24hrs) due to surgical blood loss or risk of bleeding: not applicable -> plan is to reinitiate enoxaparin this evening 8 hours after sheath removal.   Glenetta Hew, MD

## 2019-08-20 NOTE — Progress Notes (Signed)
[place] for lovenox Indication: hx  pulmonary embolus  Allergies  Allergen Reactions  . Latex Rash  . Codeine Nausea And Vomiting  . Contrast Media [Iodinated Diagnostic Agents]     Reportedly cardiac arrest  . Integrilin [Eptifibatide]     Reportedly had shortness of breath, confusion.  . Tylenol [Acetaminophen]     Tylenol -3 with codiene  . Zithromax [Azithromycin] Nausea And Vomiting  . Glipizide Rash    Headache    Patient Measurements: Height: 5\' 8"  (172.7 cm) Weight: 96.6 kg (213 lb) IBW/kg (Calculated) : 68.4 Heparin Dosing Weight:   Vital Signs: Temp: 98.3 F (36.8 C) (04/23 1849) BP: 138/79 (04/23 1849) Pulse Rate: 86 (04/23 1849)  Labs: Recent Labs    08/20/19 0608  LABPROT 12.8  INR 1.0    Estimated Creatinine Clearance: 77.9 mL/min (by C-G formula based on SCr of 1.18 mg/dL).   Medications:  PTA Warfarin regimen: 7.5 mg daily except 5 mg on Tues (per pt) Lovenox 100 mg SQ 12h  Assessment: Patient is a 59 y.o M with hx PE on warfarin PTA, [facility] to [facility]/23 for cardiac cath procedure.  He was instructed to stop taking warfarin 5 days prior to procedure and was bridged with LMWH 100 mg SQ q12h. Cath showed multivessel CAD with plan to consult TCTS for possible CABG. Pharmacy was consulted to start LMWH  8 hr post sheath removal.  - sheath removed at 1453 on 4/23  - per pt last dose of LMWH taken was on 4/22 at 0700  Goal of Therapy:  Anti-Xa level 0.6-1 units/ml 4hrs after LMWH dose given Monitor platelets by anticoagulation protocol: Yes   Plan:  - start lovenox 100 mg SQ q12h (first dose at 11pm) - cbc q72 hrs - monitor for s/sx bleeding - If to proceed with CABG, please advise when LMWH needs to held for procedure  John Dodson P 08/20/2019,7:18 PM

## 2019-08-20 NOTE — Progress Notes (Signed)
Cardiology Note  John Dodson is a 59 y.o. male with PMH notable for significant multivessel disease with PCI now in LAD, OM1, OM 2, L PDA and RCA most recently multiple stents in the RCA in July and August 2020. Since having COVID-19 disease in December he has had increasing episodes of prolonged angina.  He is to have diagnostic catheterization today by Dr. Ellyn Hack.  I was called to short stay because of significant chest discomfort radiating to left arm unresponsive to 2 nitroglycerin tablets.  A third nitroglycerin tablet has brought about significant relief but not complete.  EKG demonstrates pacing with right bundle appearance, unchanged from 318/2021.  On exam he is warm, dry, and has good skin color.  No significant murmur is heard.  No JVD is noted.  Lungs are clear.  Impression: Patient with extensive CAD history and multiple stents, most recently in August 2020 with progressive angina since December 2020.  Current episode is significantly improved after 3 sublingual nitroglycerin.  There is mild residual discomfort.  PLAN: I have called the Cath Lab to have him be on deck for the next slot to evaluate his coronary status.  He will moved to the holding area where IV nitroglycerin will be started at 15 mcg/min.  Critical care: 35 min

## 2019-08-20 NOTE — Progress Notes (Addendum)
C/o cp 8/10 left chest, pain to back , cath lab notified, O2 applied ekg done/ Dr Tamala Julian returned page to see patient. Nitro given. 1230 pt transferred to cath holding with RN and monitor.

## 2019-08-21 ENCOUNTER — Inpatient Hospital Stay (HOSPITAL_COMMUNITY): Payer: Medicare Other

## 2019-08-21 DIAGNOSIS — I361 Nonrheumatic tricuspid (valve) insufficiency: Secondary | ICD-10-CM | POA: Diagnosis not present

## 2019-08-21 DIAGNOSIS — I200 Unstable angina: Secondary | ICD-10-CM | POA: Diagnosis not present

## 2019-08-21 LAB — BASIC METABOLIC PANEL
Anion gap: 8 (ref 5–15)
BUN: 20 mg/dL (ref 6–20)
CO2: 26 mmol/L (ref 22–32)
Calcium: 9.1 mg/dL (ref 8.9–10.3)
Chloride: 107 mmol/L (ref 98–111)
Creatinine, Ser: 1.46 mg/dL — ABNORMAL HIGH (ref 0.61–1.24)
GFR calc Af Amer: >60 mL/min (ref >60)
GFR calc non Af Amer: 53 mL/min — ABNORMAL LOW (ref >60)
Glucose, Bld: 182 mg/dL — ABNORMAL HIGH (ref 70–99)
Potassium: 4.1 mmol/L (ref 3.5–5.1)
Sodium: 141 mmol/L (ref 135–145)

## 2019-08-21 LAB — GLUCOSE, CAPILLARY
Glucose-Capillary: 98 mg/dL (ref 70–99)
Glucose-Capillary: 134 mg/dL — ABNORMAL HIGH (ref 70–99)
Glucose-Capillary: 211 mg/dL — ABNORMAL HIGH (ref 70–99)
Glucose-Capillary: 222 mg/dL — ABNORMAL HIGH (ref 70–99)

## 2019-08-21 LAB — CBC
HCT: 41.2 % (ref 39.0–52.0)
Hemoglobin: 12.9 g/dL — ABNORMAL LOW (ref 13.0–17.0)
MCH: 26.8 pg (ref 26.0–34.0)
MCHC: 31.3 g/dL (ref 30.0–36.0)
MCV: 85.7 fL (ref 80.0–100.0)
Platelets: 134 10*3/uL — ABNORMAL LOW (ref 150–400)
RBC: 4.81 MIL/uL (ref 4.22–5.81)
RDW: 14.6 % (ref 11.5–15.5)
WBC: 8.9 10*3/uL (ref 4.0–10.5)
nRBC: 0 % (ref 0.0–0.2)

## 2019-08-21 LAB — HEMOGLOBIN A1C
Hgb A1c MFr Bld: 7.6 % — ABNORMAL HIGH (ref 4.8–5.6)
Mean Plasma Glucose: 171.42 mg/dL

## 2019-08-21 LAB — ECHOCARDIOGRAM COMPLETE
Height: 68 in
Weight: 3432.12 oz

## 2019-08-21 MED ORDER — ~~LOC~~ CARDIAC SURGERY, PATIENT & FAMILY EDUCATION
Freq: Once | Status: AC
  Filled 2019-08-21: qty 1

## 2019-08-21 NOTE — Progress Notes (Signed)
Inpatient Diabetes Program Recommendations  AACE/ADA: New Consensus Statement on Inpatient Glycemic Control (2015)  Target Ranges:  Prepandial:   less than 140 mg/dL      Peak postprandial:   less than 180 mg/dL (1-2 hours)      Critically ill patients:  140 - 180 mg/dL   Results for GAYLAN, HEGE (MRN [id number]) as of 08/21/2019 14:51  Ref. Range 08/20/2019 06:12 08/20/2019 15:33 08/20/2019 19:34 08/20/2019 21:48  Glucose-Capillary Latest Ref Range: 70 - 99 mg/dL 271 (H) 196 (H) 330 (H) 320 (H)  70 units LANTUS given at 11:27pm   Results for NEHAMIAH, HIMMELSBACH (MRN [id number]) as of 08/21/2019 14:51  Ref. Range 08/21/2019 08:10 08/21/2019 13:36  Glucose-Capillary Latest Ref Range: 70 - 99 mg/dL 98 134 (H)  3 units NOVOLOG    Results for BLAKLEY, LAVERDE (MRN [id number]) as of 08/21/2019 14:51  Ref. Range 08/21/2019 03:43  Hemoglobin A1C Latest Ref Range: 4.8 - 5.6 % 7.6 (H)    Admit with: CP/ CAD  History: DM, CKD  Home DM Meds: Lantus 175 units QPM??       Metformin 500 mg BID  Current Orders: Lantus 70 units QPM      Novolog Resistant Correction Scale/ SSI (0-20 units) TID [facility]    May need CABG per MD notes.    MD- Note Lantus 70 units QPM started last night.  CBG down to 98 this AM.  Agree with addition of Lantus.  Will follow and assist.     --Will follow patient during hospitalization--  Wyn Quaker RN, MSN, CDE Diabetes Coordinator Inpatient Glycemic Control Team Team Pager: [phone] (8a-5p)

## 2019-08-21 NOTE — Progress Notes (Signed)
  Echocardiogram 2D Echocardiogram has been performed.  Merrie Roof F 08/21/2019, 12:29 PM

## 2019-08-21 NOTE — Progress Notes (Signed)
Progress Note  Patient Name: John Dodson Date of Encounter: 08/21/2019  Primary Cardiologist: Glenetta Hew, MD   Subjective   Feels well, ambulating in room with family on rounds.   Inpatient Medications    Scheduled Meds: . atorvastatin  40 mg Oral Daily  . enoxaparin (LOVENOX) injection  100 mg Subcutaneous Q12H  . insulin aspart  0-20 Units Subcutaneous TID WC  . insulin glargine  70 Units Subcutaneous QPM  . losartan  50 mg Oral Daily  . magnesium oxide  600 mg Oral Daily  . metoprolol succinate  100 mg Oral Daily  . omega-3 acid ethyl esters  1,000 mg Oral BID  . pantoprazole  40 mg Oral BID AC  . ranolazine  1,000 mg Oral BID  . sodium chloride flush  3 mL Intravenous Q12H   Continuous Infusions: . sodium chloride    . sodium chloride    . nitroGLYCERIN     PRN Meds: sodium chloride, acetaminophen, albuterol, fluorouracil, hydrocortisone cream, melatonin, morphine injection, nitroGLYCERIN, nitroGLYCERIN, ondansetron (ZOFRAN) IV, sodium chloride flush   Vital Signs    Vitals:   08/20/19 2341 08/21/19 0449 08/21/19 0500 08/21/19 0813  BP:  111/63  132/71  Pulse:  66  65  Resp:  18  19  Temp:  98.2 F (36.8 C)  98.1 F (36.7 C)  TempSrc:  Oral  Oral  SpO2: 97% 97%  98%  Weight:   97.3 kg   Height:        Intake/Output Summary (Last 24 hours) at 08/21/2019 1304 Last data filed at 08/21/2019 0941 Gross per 24 hour  Intake 756.9 ml  Output 2325 ml  Net -1568.1 ml   Last 3 Weights 08/21/2019 08/20/2019 08/12/2019  Weight (lbs) 214 lb 8.1 oz 213 lb 213 lb  Weight (kg) 97.3 kg 96.616 kg 96.616 kg      Telemetry    V paced rhythm - Personally Reviewed  ECG    V paced right bundle morphology - Personally Reviewed  Physical Exam   GEN: No acute distress.   Neck: No JVD Cardiac: RRR, no murmurs, rubs, or gallops.  Respiratory: Clear to auscultation bilaterally. GI: Soft, nontender, non-distended  MS: No edema; No deformity. Neuro:   Nonfocal  Psych: Normal affect   Labs    High Sensitivity Troponin:  No results for input(s): TROPONINIHS in the last 720 hours.    Chemistry Recent Labs  Lab 08/17/19 1038 08/21/19 0344  NA 140 141  K 4.7 4.1  CL 102 107  CO2 24 26  GLUCOSE 119* 182*  BUN 23 20  CREATININE 1.18 1.46*  CALCIUM 9.6 9.1  GFRNONAA 68 53*  GFRAA 79 >60  ANIONGAP  --  8     Hematology Recent Labs  Lab 08/17/19 1038 08/21/19 0344  WBC 5.4 8.9  RBC 5.16 4.81  HGB 14.0 12.9*  HCT 43.6 41.2  MCV 85 85.7  MCH 27.1 26.8  MCHC 32.1 31.3  RDW 14.7 14.6  PLT 149* 134*    BNPNo results for input(s): BNP, PROBNP in the last 168 hours.   DDimer No results for input(s): DDIMER in the last 168 hours.   Radiology    CARDIAC CATHETERIZATION  Result Date: 08/20/2019  The left ventricular systolic function is normal. The left ventricular ejection fraction is 55-65% by visual estimate. LV end diastolic pressure is normal.LV end diastolic pressure is normal.  -------------------  There is stent from ostial to almost distal small nondominant RCA:  Ost RCA to Mid RCA stent is diffusely 35% stenosed.  Ost Cx to Prox Cx lesion is 60% stenosed.  Ost LAD to Mid LAD stent is 10% stenosed. Mid LAD to Dist LAD lesion is 60% stenosed.  Previously placed 2nd Mrg stent (DES) is widely patent.  Lat 2nd Mrg lesion is 80% stenosed. Previously described.  Previously placed 1st LPL stent (DES) is widely patent.  LPAV lesion is 50% stenosed.  LPDA stent is 5% stenosed.   Multivessel CAD with essentially patent stents in proximal to mid LAD, proximal OM1, proximal LPL1, several stents in the L PDA as well as RCA that is a small nondominant vessel.  No obvious lesions within stents noted.  Ostial LCx roughly 60% --> highly DFR positive with 0.84  Long mid LAD 60% after stent--highly DFR +0.67-0.73  Normal LVEDP With significant LAD as well as ostial LCx disease--best course of action is CVTS consultation for CABG.  Glenetta Hew, MD   Cardiac Studies   Echo: IMPRESSIONS    1. Left ventricular ejection fraction, by estimation, is 55 to 60%. The  left ventricle has normal function. Left ventricular endocardial border  not optimally defined to evaluate regional wall motion. There is moderate  concentric left ventricular  hypertrophy. Left ventricular diastolic parameters are consistent with  Grade I diastolic dysfunction (impaired relaxation).  2. Right ventricular systolic function is normal. The right ventricular  size is normal. There is borderline elevated pulmonary artery systolic  pressure. The estimated right ventricular systolic pressure is [id number] mmHg.  3. The mitral valve is normal in structure. Trivial mitral valve  regurgitation. No evidence of mitral stenosis.  4. Tricuspid valve regurgitation is mild to moderate.  5. The aortic valve is normal in structure. Aortic valve regurgitation is  not visualized. No aortic stenosis is present.  6. The inferior vena cava is normal in size with greater than 50%  respiratory variability, suggesting right atrial pressure of 3 mmHg.   Comparison(s): No prior Echocardiogram.   Patient Profile     59 y.o. male with PMH notable for significant multivessel disease with PCI now in LAD, OM1, OM 2, L PDA and RCA most recently multiple stents in the RCA in July and August 2020. Since having COVID-19 disease in December he has had increasing episodes of prolonged angina. Had chest pain just prior to outpatient coronary angiography yesterday morning, where he was noted to have multivessel CAD with hemodynamically significant lesions in the mid LAD and ostial circumflex.  With his history of significant CAD and accelerating angina, plan was for T CTS consultation.  Tentative plan for CABG next week pending CV surgery evaluation.  Assessment & Plan    #1 CAD #2  Chest pain  -history of multiple prior interventions, with progressive angina and  hemodynamically significant lesions in the LAD and circumflex.  Preliminary T CTS note suggests possible CABG next week after Plavix washout.  Patient's last dose of Plavix was 08/20/2019 at 4:00 in the morning. -Continue atorvastatin, losartan, metoprolol succinate, ranolazine  #3 pulmonary embolism on long-term anticoagulation -Patient was bridged with Lovenox prior to cath -Patient is currently continuing on Lovenox 100 mg every 12 hours for therapeutic anticoagulation  #4 hyperlipidemia Continue atorvastatin  #5 hypertension Continue losartan and metoprolol, BP with reasonable control.  #6 CKD Creatinine today 1.46, monitor, likely 2/2 post cath  #7 history of COVID-19 infection -Stable from a respiratory standpoint  #8 diabetes mellitus type 2 Continue Lantus Continue sliding scale insulin Home oral  hypoglycemic agents are on hold including Jardiance, Metformin, Onglyza - consult DM coordinator.  - discussed with nurse Clair Gulling, stable BG currently.   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   For questions or updates, please contact [place] Please consult [email or web address] for contact info under        Signed, Elouise Munroe, MD  08/21/2019, 1:04 PM

## 2019-08-21 NOTE — Consult Note (Signed)
TCTS Preliminary Consult Note  Kindly asked to see 59 yo man with longstanding h/o CAD s/p PCI now s/p repeat LHC showing severe diffuse CAD. Consult for CABG. He has been treated with clopidogrel until recently so will need this to "wash out" prior to CABG to reduce risk for bleeding and excessive transfusion. Will complete the standard workup for CABG; full consult note to follow pending additional studies. Tentatively plan surgery for 08/25/19.  Gerlean Cid Z. Orvan Seen, [place]

## 2019-08-22 DIAGNOSIS — I200 Unstable angina: Secondary | ICD-10-CM | POA: Diagnosis not present

## 2019-08-22 LAB — CBC
HCT: 46.5 % (ref 39.0–52.0)
Hemoglobin: 14.6 g/dL (ref 13.0–17.0)
MCH: 27.4 pg (ref 26.0–34.0)
MCHC: 31.4 g/dL (ref 30.0–36.0)
MCV: 87.2 fL (ref 80.0–100.0)
Platelets: 145 10*3/uL — ABNORMAL LOW (ref 150–400)
RBC: 5.33 MIL/uL (ref 4.22–5.81)
RDW: 14.8 % (ref 11.5–15.5)
WBC: 6.9 10*3/uL (ref 4.0–10.5)
nRBC: 0 % (ref 0.0–0.2)

## 2019-08-22 LAB — GLUCOSE, CAPILLARY
Glucose-Capillary: 98 mg/dL (ref 70–99)
Glucose-Capillary: 124 mg/dL — ABNORMAL HIGH (ref 70–99)
Glucose-Capillary: 189 mg/dL — ABNORMAL HIGH (ref 70–99)
Glucose-Capillary: 227 mg/dL — ABNORMAL HIGH (ref 70–99)

## 2019-08-22 NOTE — Progress Notes (Signed)
Progress Note  Patient Name: John Dodson Date of Encounter: 08/22/2019  Primary Cardiologist: Glenetta Hew, MD   Subjective   Feels well, ambulating in hallway. No CP.  Inpatient Medications    Scheduled Meds: . atorvastatin  40 mg Oral Daily  . enoxaparin (LOVENOX) injection  100 mg Subcutaneous Q12H  . insulin aspart  0-20 Units Subcutaneous TID WC  . insulin glargine  70 Units Subcutaneous QPM  . losartan  50 mg Oral Daily  . magnesium oxide  600 mg Oral Daily  . metoprolol succinate  100 mg Oral Daily  . omega-3 acid ethyl esters  1,000 mg Oral BID  . pantoprazole  40 mg Oral BID AC  . ranolazine  1,000 mg Oral BID  . sodium chloride flush  3 mL Intravenous Q12H   Continuous Infusions: . sodium chloride    . sodium chloride    . nitroGLYCERIN     PRN Meds: sodium chloride, acetaminophen, albuterol, fluorouracil, hydrocortisone cream, melatonin, morphine injection, nitroGLYCERIN, nitroGLYCERIN, ondansetron (ZOFRAN) IV, sodium chloride flush   Vital Signs    Vitals:   08/21/19 0813 08/21/19 1938 08/21/19 2052 08/22/19 0444  BP: 132/71 (!) 125/57  115/74  Pulse: 65 74  62  Resp: 19 17  17   Temp: 98.1 F (36.7 C) 98.7 F (37.1 C)  98.1 F (36.7 C)  TempSrc: Oral Oral  Oral  SpO2: 98% 97% 96% 98%  Weight:      Height:        Intake/Output Summary (Last 24 hours) at 08/22/2019 1100 Last data filed at 08/22/2019 0707 Gross per 24 hour  Intake --  Output 1200 ml  Net -1200 ml   Last 3 Weights 08/21/2019 08/20/2019 08/12/2019  Weight (lbs) 214 lb 8.1 oz 213 lb 213 lb  Weight (kg) 97.3 kg 96.616 kg 96.616 kg      Telemetry    V paced rhythm - Personally Reviewed  ECG    V paced right bundle morphology - Personally Reviewed  Physical Exam   GEN: No acute distress.   Neck: No JVD Cardiac: RRR, no murmurs, rubs, or gallops.  Respiratory: Clear to auscultation bilaterally. GI: Soft, nontender, non-distended  MS: No edema; No  deformity. Neuro:  Nonfocal  Psych: Normal affect   Labs    High Sensitivity Troponin:  No results for input(s): TROPONINIHS in the last 720 hours.    Chemistry Recent Labs  Lab 08/17/19 1038 08/21/19 0344  NA 140 141  K 4.7 4.1  CL 102 107  CO2 24 26  GLUCOSE 119* 182*  BUN 23 20  CREATININE 1.18 1.46*  CALCIUM 9.6 9.1  GFRNONAA 68 53*  GFRAA 79 >60  ANIONGAP  --  8     Hematology Recent Labs  Lab 08/17/19 1038 08/21/19 0344 08/22/19 0754  WBC 5.4 8.9 6.9  RBC 5.16 4.81 5.33  HGB 14.0 12.9* 14.6  HCT 43.6 41.2 46.5  MCV 85 85.7 87.2  MCH 27.1 26.8 27.4  MCHC 32.1 31.3 31.4  RDW 14.7 14.6 14.8  PLT 149* 134* 145*    BNPNo results for input(s): BNP, PROBNP in the last 168 hours.   DDimer No results for input(s): DDIMER in the last 168 hours.   Radiology    CARDIAC CATHETERIZATION  Result Date: 08/20/2019  The left ventricular systolic function is normal. The left ventricular ejection fraction is 55-65% by visual estimate. LV end diastolic pressure is normal.LV end diastolic pressure is normal.  -------------------  There  is stent from ostial to almost distal small nondominant RCA: Ost RCA to Mid RCA stent is diffusely 35% stenosed.  Ost Cx to Prox Cx lesion is 60% stenosed.  Ost LAD to Mid LAD stent is 10% stenosed. Mid LAD to Dist LAD lesion is 60% stenosed.  Previously placed 2nd Mrg stent (DES) is widely patent.  Lat 2nd Mrg lesion is 80% stenosed. Previously described.  Previously placed 1st LPL stent (DES) is widely patent.  LPAV lesion is 50% stenosed.  LPDA stent is 5% stenosed.   Multivessel CAD with essentially patent stents in proximal to mid LAD, proximal OM1, proximal LPL1, several stents in the L PDA as well as RCA that is a small nondominant vessel.  No obvious lesions within stents noted.  Ostial LCx roughly 60% --> highly DFR positive with 0.84  Long mid LAD 60% after stent--highly DFR +0.67-0.73  Normal LVEDP With significant LAD as  well as ostial LCx disease--best course of action is CVTS consultation for CABG. Glenetta Hew, MD  ECHOCARDIOGRAM COMPLETE  Result Date: 08/21/2019    ECHOCARDIOGRAM REPORT   Patient Name:   John Dodson Date of Exam: 08/21/2019 Medical Rec #:  [id number]           Height:       68.0 in Accession #:    [id number]          Weight:       214.5 lb Date of Birth:  13-Sep-1960          BSA:          2.105 m Patient Age:    59 years            BP:           132/71 mmHg Patient Gender: M                   HR:           66 bpm. Exam Location:  Inpatient Procedure: 2D Echo, Cardiac Doppler and Color Doppler Indications:    CAD Native Vessel 414.01/I24.10  History:        Patient has no prior history of Echocardiogram examinations.  Sonographer:    Merrie Roof RDCS Referring Phys: [place]  1. Left ventricular ejection fraction, by estimation, is 55 to 60%. The left ventricle has normal function. Left ventricular endocardial border not optimally defined to evaluate regional wall motion. There is moderate concentric left ventricular hypertrophy. Left ventricular diastolic parameters are consistent with Grade I diastolic dysfunction (impaired relaxation).  2. Right ventricular systolic function is normal. The right ventricular size is normal. There is borderline elevated pulmonary artery systolic pressure. The estimated right ventricular systolic pressure is [id number] mmHg.  3. The mitral valve is normal in structure. Trivial mitral valve regurgitation. No evidence of mitral stenosis.  4. Tricuspid valve regurgitation is mild to moderate.  5. The aortic valve is normal in structure. Aortic valve regurgitation is not visualized. No aortic stenosis is present.  6. The inferior vena cava is normal in size with greater than 50% respiratory variability, suggesting right atrial pressure of 3 mmHg. Comparison(s): No prior Echocardiogram. FINDINGS  Left Ventricle: Left ventricular ejection fraction, by  estimation, is 55 to 60%. The left ventricle has normal function. Left ventricular endocardial border not optimally defined to evaluate regional wall motion. The left ventricular internal cavity size was normal in size. There is moderate concentric left ventricular hypertrophy. Left ventricular diastolic parameters  are consistent with Grade I diastolic dysfunction (impaired relaxation). Right Ventricle: The right ventricular size is normal. No increase in right ventricular wall thickness. Right ventricular systolic function is normal. There is borderline elevated pulmonary artery systolic pressure. The tricuspid regurgitant velocity is 2.82 m/s, and with an assumed right atrial pressure of 3 mmHg, the estimated right ventricular systolic pressure is [id number] mmHg. Left Atrium: Left atrial size was normal in size. Right Atrium: Right atrial size was normal in size. Pericardium: There is no evidence of pericardial effusion. Mitral Valve: The mitral valve is normal in structure. Normal mobility of the mitral valve leaflets. Trivial mitral valve regurgitation. No evidence of mitral valve stenosis. Tricuspid Valve: The tricuspid valve is normal in structure. Tricuspid valve regurgitation is mild to moderate. No evidence of tricuspid stenosis. Aortic Valve: The aortic valve is normal in structure. Aortic valve regurgitation is not visualized. No aortic stenosis is present. Pulmonic Valve: The pulmonic valve was normal in structure. Pulmonic valve regurgitation is trivial. No evidence of pulmonic stenosis. Aorta: The aortic root and ascending aorta are structurally normal, with no evidence of dilitation. Venous: The inferior vena cava is normal in size with greater than 50% respiratory variability, suggesting right atrial pressure of 3 mmHg. IAS/Shunts: No atrial level shunt detected by color flow Doppler. Additional Comments: A pacer wire is visualized in the right ventricle.  LEFT VENTRICLE PLAX 2D LVIDd:         4.30 cm       Diastology LVIDs:         2.90 cm      LV e' lateral:   8.70 cm/s LV PW:         1.40 cm      LV E/e' lateral: 7.7 LV IVS:        1.40 cm      LV e' medial:    5.76 cm/s LVOT diam:     2.00 cm      LV E/e' medial:  11.7 LV SV:         66 LV SV Index:   31 LVOT Area:     3.14 cm  LV Volumes (MOD) LV vol d, MOD A2C: 72.5 ml LV vol d, MOD A4C: 105.0 ml LV vol s, MOD A2C: 25.1 ml LV vol s, MOD A4C: 45.1 ml LV SV MOD A2C:     47.4 ml LV SV MOD A4C:     105.0 ml LV SV MOD BP:      49.9 ml RIGHT VENTRICLE             IVC RV Basal diam:  4.00 cm     IVC diam: 1.40 cm RV S prime:     11.40 cm/s TAPSE (M-mode): 2.1 cm LEFT ATRIUM             Index       RIGHT ATRIUM           Index LA diam:        3.50 cm 1.66 cm/m  RA Area:     15.90 cm LA Vol (A2C):   47.4 ml 22.51 ml/m RA Volume:   46.70 ml  22.18 ml/m LA Vol (A4C):   46.2 ml 21.94 ml/m LA Biplane Vol: 46.6 ml 22.13 ml/m  AORTIC VALVE LVOT Vmax:   97.90 cm/s LVOT Vmean:  65.700 cm/s LVOT VTI:    0.211 m  AORTA Ao Root diam: 3.00 cm Ao Asc diam:  3.00 cm MITRAL VALVE  TRICUSPID VALVE MV Area (PHT): 5.62 cm    TR Peak grad:   31.8 mmHg MV Decel Time: 135 msec    TR Vmax:        282.00 cm/s MV E velocity: 67.30 cm/s MV A velocity: 79.30 cm/s  SHUNTS MV E/A ratio:  0.85        Systemic VTI:  0.21 m                            Systemic Diam: 2.00 cm Cherlynn Kaiser MD Electronically signed by Cherlynn Kaiser MD Signature Date/Time: 08/21/2019/1:13:28 PM    Final     Cardiac Studies   Echo: IMPRESSIONS    1. Left ventricular ejection fraction, by estimation, is 55 to 60%. The  left ventricle has normal function. Left ventricular endocardial border  not optimally defined to evaluate regional wall motion. There is moderate  concentric left ventricular  hypertrophy. Left ventricular diastolic parameters are consistent with  Grade I diastolic dysfunction (impaired relaxation).  2. Right ventricular systolic function is normal. The right  ventricular  size is normal. There is borderline elevated pulmonary artery systolic  pressure. The estimated right ventricular systolic pressure is [id number] mmHg.  3. The mitral valve is normal in structure. Trivial mitral valve  regurgitation. No evidence of mitral stenosis.  4. Tricuspid valve regurgitation is mild to moderate.  5. The aortic valve is normal in structure. Aortic valve regurgitation is  not visualized. No aortic stenosis is present.  6. The inferior vena cava is normal in size with greater than 50%  respiratory variability, suggesting right atrial pressure of 3 mmHg.   Comparison(s): No prior Echocardiogram.   Patient Profile     59 y.o. male with PMH notable for significant multivessel disease with PCI now in LAD, OM1, OM 2, L PDA and RCA most recently multiple stents in the RCA in July and August 2020. Since having COVID-19 disease in December he has had increasing episodes of prolonged angina. Had chest pain just prior to outpatient coronary angiography yesterday morning, where he was noted to have multivessel CAD with hemodynamically significant lesions in the mid LAD and ostial circumflex.  With his history of significant CAD and accelerating angina, plan was for T CTS consultation.  Tentative plan for CABG next week pending CV surgery evaluation.  Assessment & Plan    #1 CAD #2  Chest pain  -history of multiple prior interventions, with progressive angina and hemodynamically significant lesions in the LAD and circumflex.  Preliminary T CTS note suggests possible CABG next week after Plavix washout.  Patient's last dose of Plavix was 08/20/2019 at 4:00 in the morning. -Continue atorvastatin, losartan, metoprolol succinate, ranolazine  #3 pulmonary embolism on long-term anticoagulation -Patient was bridged with Lovenox prior to cath -Patient is currently continuing on Lovenox 100 mg every 12 hours for therapeutic anticoagulation  #4 hyperlipidemia Continue  atorvastatin  #5 hypertension Continue losartan and metoprolol, BP with reasonable control.  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   For questions or updates, please contact [facility] Please consult [email or web address] for contact info under        Signed, Elouise Munroe, MD  08/22/2019, 11:00 AM

## 2019-08-23 ENCOUNTER — Other Ambulatory Visit (HOSPITAL_COMMUNITY): Payer: Medicare Other

## 2019-08-23 ENCOUNTER — Inpatient Hospital Stay (HOSPITAL_COMMUNITY): Payer: Medicare Other

## 2019-08-23 DIAGNOSIS — I200 Unstable angina: Secondary | ICD-10-CM | POA: Diagnosis not present

## 2019-08-23 LAB — BASIC METABOLIC PANEL
Anion gap: 8 (ref 5–15)
BUN: 24 mg/dL — ABNORMAL HIGH (ref 6–20)
CO2: 27 mmol/L (ref 22–32)
Calcium: 9 mg/dL (ref 8.9–10.3)
Chloride: 104 mmol/L (ref 98–111)
Creatinine, Ser: 1.57 mg/dL — ABNORMAL HIGH (ref 0.61–1.24)
GFR calc Af Amer: 56 mL/min — ABNORMAL LOW (ref >60)
GFR calc non Af Amer: 48 mL/min — ABNORMAL LOW (ref >60)
Glucose, Bld: 147 mg/dL — ABNORMAL HIGH (ref 70–99)
Potassium: 4.1 mmol/L (ref 3.5–5.1)
Sodium: 139 mmol/L (ref 135–145)

## 2019-08-23 LAB — PULMONARY FUNCTION TEST
FEF 25-75 Pre: 1.09 L/sec
FEF2575-%Pred-Pre: 37 %
FEV1-%Pred-Pre: 75 %
FEV1-Pre: 2.6 L
FEV1FVC-%Pred-Pre: 91 %
FEV6-%Pred-Pre: 77 %
FEV6-Pre: 3.35 L
FEV6FVC-%Pred-Pre: 94 %
FVC-%Pred-Pre: 82 %
FVC-Pre: 3.73 L
Pre FEV1/FVC ratio: 70 %
Pre FEV6/FVC Ratio: 90 %

## 2019-08-23 LAB — GLUCOSE, CAPILLARY
Glucose-Capillary: 124 mg/dL — ABNORMAL HIGH (ref 70–99)
Glucose-Capillary: 136 mg/dL — ABNORMAL HIGH (ref 70–99)
Glucose-Capillary: 218 mg/dL — ABNORMAL HIGH (ref 70–99)
Glucose-Capillary: 183 mg/dL — ABNORMAL HIGH (ref 70–99)

## 2019-08-23 MED ORDER — ASPIRIN EC 81 MG PO TBEC
81.0000 mg | DELAYED_RELEASE_TABLET | Freq: Every day | ORAL | Status: DC
  Administered 2019-08-23 – 2019-08-24 (×2): 81 mg via ORAL
  Filled 2019-08-23 (×2): qty 1

## 2019-08-23 MED ORDER — LOSARTAN POTASSIUM 25 MG PO TABS
25.0000 mg | ORAL_TABLET | Freq: Every day | ORAL | Status: DC
  Administered 2019-08-24: 10:00:00 25 mg via ORAL
  Filled 2019-08-23 (×3): qty 1

## 2019-08-23 MED FILL — Nitroglycerin IV Soln 100 MCG/ML in D5W: INTRA_ARTERIAL | Qty: 10 | Status: AC

## 2019-08-23 NOTE — Progress Notes (Addendum)
Progress Note  Patient Name: John Dodson Date of Encounter: 08/23/2019  Primary Cardiologist: Glenetta Hew, MD   Subjective   Feeling well. No chest pain, sob or palpitations. CABG on 4/28.  Inpatient Medications    Scheduled Meds: . atorvastatin  40 mg Oral Daily  . enoxaparin (LOVENOX) injection  100 mg Subcutaneous Q12H  . insulin aspart  0-20 Units Subcutaneous TID WC  . insulin glargine  70 Units Subcutaneous QPM  . losartan  50 mg Oral Daily  . magnesium oxide  600 mg Oral Daily  . metoprolol succinate  100 mg Oral Daily  . omega-3 acid ethyl esters  1,000 mg Oral BID  . pantoprazole  40 mg Oral BID AC  . ranolazine  1,000 mg Oral BID  . sodium chloride flush  3 mL Intravenous Q12H   Continuous Infusions: . sodium chloride    . sodium chloride    . nitroGLYCERIN     PRN Meds: sodium chloride, acetaminophen, albuterol, fluorouracil, hydrocortisone cream, melatonin, morphine injection, nitroGLYCERIN, nitroGLYCERIN, ondansetron (ZOFRAN) IV, sodium chloride flush   Vital Signs    Vitals:   08/22/19 0444 08/22/19 1458 08/22/19 1947 08/23/19 0502  BP: 115/74 123/63 126/65 126/66  Pulse: 62 64 63 65  Resp: 17 16 18 18   Temp: 98.1 F (36.7 C) 98 F (36.7 C) 98.1 F (36.7 C) 98.1 F (36.7 C)  TempSrc: Oral Oral Oral Oral  SpO2: 98% 98% 99% 98%  Weight:    95.9 kg  Height:        Intake/Output Summary (Last 24 hours) at 08/23/2019 0947 Last data filed at 08/23/2019 0500 Gross per 24 hour  Intake 603 ml  Output 1700 ml  Net -1097 ml   Last 3 Weights 08/23/2019 08/21/2019 08/20/2019  Weight (lbs) 211 lb 6.4 oz 214 lb 8.1 oz 213 lb  Weight (kg) 95.89 kg 97.3 kg 96.616 kg      Telemetry    V paced rhythm  - Personally Reviewed  ECG    No new tracing   Physical Exam   GEN: No acute distress.   Neck: No JVD Cardiac: RRR, no murmurs, rubs, or gallops.  Respiratory: Clear to auscultation bilaterally. GI: Soft, nontender, non-distended  MS: No  edema; No deformity. Neuro:  Nonfocal  Psych: Normal affect   Labs     Chemistry Recent Labs  Lab 08/17/19 1038 08/21/19 0344 08/23/19 0321  NA 140 141 139  K 4.7 4.1 4.1  CL 102 107 104  CO2 24 26 27   GLUCOSE 119* 182* 147*  BUN 23 20 24*  CREATININE 1.18 1.46* 1.57*  CALCIUM 9.6 9.1 9.0  GFRNONAA 68 53* 48*  GFRAA 79 >60 56*  ANIONGAP  --  8 8     Hematology Recent Labs  Lab 08/17/19 1038 08/21/19 0344 08/22/19 0754  WBC 5.4 8.9 6.9  RBC 5.16 4.81 5.33  HGB 14.0 12.9* 14.6  HCT 43.6 41.2 46.5  MCV 85 85.7 87.2  MCH 27.1 26.8 27.4  MCHC 32.1 31.3 31.4  RDW 14.7 14.6 14.8  PLT 149* 134* 145*    Lipid Panel  No results found for: CHOL, TRIG, HDL, CHOLHDL, VLDL, LDLCALC, LDLDIRECT, LABVLDL  BNPNo results for input(s): BNP, PROBNP in the last 168 hours.   DDimer No results for input(s): DDIMER in the last 168 hours.   Radiology    ECHOCARDIOGRAM COMPLETE  Result Date: 08/21/2019    ECHOCARDIOGRAM REPORT   Patient Name:   John D  Dodson Date of Exam: 08/21/2019 Medical Rec #:  [id number]           Height:       68.0 in Accession #:    [id number]          Weight:       214.5 lb Date of Birth:  [birth year]          BSA:          2.105 m Patient Age:    59 years            BP:           132/71 mmHg Patient Gender: M                   HR:           66 bpm. Exam Location:  Inpatient Procedure: 2D Echo, Cardiac Doppler and Color Doppler Indications:    CAD Native Vessel 414.01/I24.10  History:        Patient has no prior history of Echocardiogram examinations.  Sonographer:    Merrie Roof RDCS Referring Phys: [place]  1. Left ventricular ejection fraction, by estimation, is 55 to 60%. The left ventricle has normal function. Left ventricular endocardial border not optimally defined to evaluate regional wall motion. There is moderate concentric left ventricular hypertrophy. Left ventricular diastolic parameters are consistent with Grade I  diastolic dysfunction (impaired relaxation).  2. Right ventricular systolic function is normal. The right ventricular size is normal. There is borderline elevated pulmonary artery systolic pressure. The estimated right ventricular systolic pressure is [id number] mmHg.  3. The mitral valve is normal in structure. Trivial mitral valve regurgitation. No evidence of mitral stenosis.  4. Tricuspid valve regurgitation is mild to moderate.  5. The aortic valve is normal in structure. Aortic valve regurgitation is not visualized. No aortic stenosis is present.  6. The inferior vena cava is normal in size with greater than 50% respiratory variability, suggesting right atrial pressure of 3 mmHg. Comparison(s): No prior Echocardiogram. FINDINGS  Left Ventricle: Left ventricular ejection fraction, by estimation, is 55 to 60%. The left ventricle has normal function. Left ventricular endocardial border not optimally defined to evaluate regional wall motion. The left ventricular internal cavity size was normal in size. There is moderate concentric left ventricular hypertrophy. Left ventricular diastolic parameters are consistent with Grade I diastolic dysfunction (impaired relaxation). Right Ventricle: The right ventricular size is normal. No increase in right ventricular wall thickness. Right ventricular systolic function is normal. There is borderline elevated pulmonary artery systolic pressure. The tricuspid regurgitant velocity is 2.82 m/s, and with an assumed right atrial pressure of 3 mmHg, the estimated right ventricular systolic pressure is [id number] mmHg. Left Atrium: Left atrial size was normal in size. Right Atrium: Right atrial size was normal in size. Pericardium: There is no evidence of pericardial effusion. Mitral Valve: The mitral valve is normal in structure. Normal mobility of the mitral valve leaflets. Trivial mitral valve regurgitation. No evidence of mitral valve stenosis. Tricuspid Valve: The tricuspid valve is normal  in structure. Tricuspid valve regurgitation is mild to moderate. No evidence of tricuspid stenosis. Aortic Valve: The aortic valve is normal in structure. Aortic valve regurgitation is not visualized. No aortic stenosis is present. Pulmonic Valve: The pulmonic valve was normal in structure. Pulmonic valve regurgitation is trivial. No evidence of pulmonic stenosis. Aorta: The aortic root and ascending aorta are structurally normal, with no evidence of dilitation. Venous: The inferior  vena cava is normal in size with greater than 50% respiratory variability, suggesting right atrial pressure of 3 mmHg. IAS/Shunts: No atrial level shunt detected by color flow Doppler. Additional Comments: A pacer wire is visualized in the right ventricle.  LEFT VENTRICLE PLAX 2D LVIDd:         4.30 cm      Diastology LVIDs:         2.90 cm      LV e' lateral:   8.70 cm/s LV PW:         1.40 cm      LV E/e' lateral: 7.7 LV IVS:        1.40 cm      LV e' medial:    5.76 cm/s LVOT diam:     2.00 cm      LV E/e' medial:  11.7 LV SV:         66 LV SV Index:   31 LVOT Area:     3.14 cm  LV Volumes (MOD) LV vol d, MOD A2C: 72.5 ml LV vol d, MOD A4C: 105.0 ml LV vol s, MOD A2C: 25.1 ml LV vol s, MOD A4C: 45.1 ml LV SV MOD A2C:     47.4 ml LV SV MOD A4C:     105.0 ml LV SV MOD BP:      49.9 ml RIGHT VENTRICLE             IVC RV Basal diam:  4.00 cm     IVC diam: 1.40 cm RV S prime:     11.40 cm/s TAPSE (M-mode): 2.1 cm LEFT ATRIUM             Index       RIGHT ATRIUM           Index LA diam:        3.50 cm 1.66 cm/m  RA Area:     15.90 cm LA Vol (A2C):   47.4 ml 22.51 ml/m RA Volume:   46.70 ml  22.18 ml/m LA Vol (A4C):   46.2 ml 21.94 ml/m LA Biplane Vol: 46.6 ml 22.13 ml/m  AORTIC VALVE LVOT Vmax:   97.90 cm/s LVOT Vmean:  65.700 cm/s LVOT VTI:    0.211 m  AORTA Ao Root diam: 3.00 cm Ao Asc diam:  3.00 cm MITRAL VALVE               TRICUSPID VALVE MV Area (PHT): 5.62 cm    TR Peak grad:   31.8 mmHg MV Decel Time: 135 msec    TR Vmax:         282.00 cm/s MV E velocity: 67.30 cm/s MV A velocity: 79.30 cm/s  SHUNTS MV E/A ratio:  0.85        Systemic VTI:  0.21 m                            Systemic Diam: 2.00 cm Cherlynn Kaiser MD Electronically signed by Cherlynn Kaiser MD Signature Date/Time: 08/21/2019/1:13:28 PM    Final     Cardiac Studies   Echo 08/21/19 1. Left ventricular ejection fraction, by estimation, is 55 to 60%. The  left ventricle has normal function. Left ventricular endocardial border  not optimally defined to evaluate regional wall motion. There is moderate  concentric left ventricular  hypertrophy. Left ventricular diastolic parameters are consistent with  Grade I diastolic dysfunction (impaired relaxation).  2. Right ventricular  systolic function is normal. The right ventricular  size is normal. There is borderline elevated pulmonary artery systolic  pressure. The estimated right ventricular systolic pressure is [id number] mmHg.  3. The mitral valve is normal in structure. Trivial mitral valve  regurgitation. No evidence of mitral stenosis.  4. Tricuspid valve regurgitation is mild to moderate.  5. The aortic valve is normal in structure. Aortic valve regurgitation is  not visualized. No aortic stenosis is present.  6. The inferior vena cava is normal in size with greater than 50%  respiratory variability, suggesting right atrial pressure of 3 mmHg.   LEFT HEART CATH AND CORONARY ANGIOGRAPHY 08/20/19  Conclusion    The left ventricular systolic function is normal. The left ventricular ejection fraction is 55-65% by visual estimate. LV end diastolic pressure is normal.LV end diastolic pressure is normal.  -------------------  There is stent from ostial to almost distal small nondominant RCA: Ost RCA to Mid RCA stent is diffusely 35% stenosed.  Ost Cx to Prox Cx lesion is 60% stenosed.  Ost LAD to Mid LAD stent is 10% stenosed. Mid LAD to Dist LAD lesion is 60% stenosed.  Previously placed 2nd Mrg  stent (DES) is widely patent.  Lat 2nd Mrg lesion is 80% stenosed. Previously described.  Previously placed 1st LPL stent (DES) is widely patent.  LPAV lesion is 50% stenosed.  LPDA stent is 5% stenosed.    Multivessel CAD with essentially patent stents in proximal to mid LAD, proximal OM1, proximal LPL1, several stents in the L PDA as well as RCA that is a small nondominant vessel. No obvious lesions within stents noted.  Ostial LCx roughly 60%-->highly DFR positive with 0.84  Long mid LAD 60% after stent--highly DFR +0.67-0.73  Normal LVEDP  With significant LAD as well as ostial LCx disease--best course of action is CVTS consultation for CABG   Diagnostic Dominance: Left    Patient Profile     59 y.o. male with PMH notable for significant multivessel disease with PCI now in LAD, OM1, OM 2, L PDA and RCA most recently multiple stents in the RCA in July and August 2020, COVID-19 infection in [id number] complicated by bilateral pulmonary embolism in January 2021 and treated with warfarin, chronic kidney disease stage III, diabetes mellitus, hypertension, complete heart block s/p pacemaker, hyperlipidemia, who seen by Dr. Ellyn Hack 4/15 for progressive angina and recommended outpatient cardiac cath >> now found to have multivessel CAD and for CABG later this week.  Assessment & Plan   1.  CAD s/p multiple prior stenting - Presented for outpatient cath for progressive worsening angina and found to have hemodynamically significant lesions in the LAD and circumflex.   - Plan for CABG 4/28 after Plavix washout -Start aspirin 81 mg -Continue atorvastatin, losartan, Toprol and Ranexa  2.  Pulmonary embolism January 2021 following Covid 19 infection -Was on warfarin for anticoagulation -On Lovenox here  3.  Hypertension -Blood pressure stable on current medication  4.  Hyperlipidemia - No results found for requested labs within last 8760 hours.  -Continue statin -Check lipid  panel  5.  Chronic kidney disease stage III -Creatinine gradually increasing 1.18>>1.46>>1.57 today (if continue to get worse,  may need to hold losartan) -Follow closely  6. OSA in BiPAP  7. Hx CHB on PPM  For questions or updates, please contact [place] Please consult [email or web address] for contact info under        SignedLeanor Kail, PA  08/23/2019, 9:47 AM  Patient seen and examined. Agree with assessment and plan.  No recurrent chest pain.  Telemetry reveals paced rhythm at 74 bpm.  Angiograms reviewed.  Patient currently undergoing Plavix washout.  CABG surgery tentatively planned on August 25, 2019 by Dr. Julien Girt.  Cr  increased to 1.57 today; will reduce losartan dose to 25 mg.  We will also check a.m. fasting labs including lipid studies.   Troy Sine, MD, [facility] 08/23/2019 11:15 AM

## 2019-08-23 NOTE — Progress Notes (Signed)
Patient walks in hall with wife several times per day.  This afternoon, after walking approximately 200 feet, he developed chest pressure, mid sternal, 6/10. EKG unchanged. Completely relieved with 2 SL nitro.

## 2019-08-23 NOTE — Progress Notes (Signed)
Pt has been walking independently without angina. Feels well. Discussed sternal precautions, IS (1750 mL), mobility post op and d/c planning with pt, wife, and father. Pt very receptive, moves well. His wife will be with him at d/c. Gave educational materials to view preop. He can walk independently in hall. [id number] [place], ACSM 10:34 AM 08/23/2019

## 2019-08-23 NOTE — Progress Notes (Signed)
Helper for lovenox Indication: hx  pulmonary embolus  Allergies  Allergen Reactions  . Latex Rash  . Codeine Nausea And Vomiting  . Contrast Media [Iodinated Diagnostic Agents]     Reportedly cardiac arrest  . Integrilin [Eptifibatide]     Reportedly had shortness of breath, confusion.  . Tylenol [Acetaminophen]     Tylenol -3 with codiene  . Zithromax [Azithromycin] Nausea And Vomiting  . Glipizide Rash    Headache    Patient Measurements: Height: 5\' 8"  (172.7 cm) Weight: 95.9 kg (211 lb 6.4 oz) IBW/kg (Calculated) : 68.4 Heparin Dosing Weight:   Vital Signs: Temp: 98.1 F (36.7 C) (04/26 0502) Temp Source: Oral (04/26 0502) BP: 126/66 (04/26 0502) Pulse Rate: 65 (04/26 0502)  Labs: Recent Labs    08/21/19 0344 08/22/19 0754 08/23/19 0321  HGB 12.9* 14.6  --   HCT 41.2 46.5  --   PLT 134* 145*  --   CREATININE 1.46*  --  1.57*    Estimated Creatinine Clearance: 58.3 mL/min (A) (by C-G formula based on SCr of 1.57 mg/dL (H)).   Medications:  PTA Warfarin regimen: 7.5 mg daily except 5 mg on Tues (per pt) Lovenox 100 mg SQ 12h -Hg= 14.6, SCr= 1.57  Assessment: Patient is a 59 y.o M with hx PE on warfarin PTA, presented to [facility] on 4/23 for cardiac cath procedure.  He was instructed to stop taking warfarin 5 days prior to procedure and was bridged with LMWH 100 mg SQ q12h. Cath showed multivessel CAD with plans for CABG on 4/26   Goal of Therapy:  Anti-Xa level 0.6-1 units/ml 4hrs after LMWH dose given Monitor platelets by anticoagulation protocol: Yes   Plan:  -Continue lovenox 100mg  [place] q12h -CABG planned 4/28  Hildred Laser, PharmD Clinical Pharmacist **Pharmacist phone directory can now be found on [email or web address] ([email or web address]).  Listed under [place].

## 2019-08-24 ENCOUNTER — Inpatient Hospital Stay (HOSPITAL_COMMUNITY): Payer: Medicare Other

## 2019-08-24 ENCOUNTER — Encounter (HOSPITAL_COMMUNITY): Payer: Self-pay | Admitting: Cardiology

## 2019-08-24 DIAGNOSIS — I200 Unstable angina: Secondary | ICD-10-CM | POA: Diagnosis not present

## 2019-08-24 DIAGNOSIS — N183 Chronic kidney disease, stage 3 unspecified: Secondary | ICD-10-CM

## 2019-08-24 DIAGNOSIS — G4733 Obstructive sleep apnea (adult) (pediatric): Secondary | ICD-10-CM | POA: Diagnosis not present

## 2019-08-24 DIAGNOSIS — E785 Hyperlipidemia, unspecified: Secondary | ICD-10-CM

## 2019-08-24 DIAGNOSIS — Z8616 Personal history of COVID-19: Secondary | ICD-10-CM

## 2019-08-24 DIAGNOSIS — I25119 Atherosclerotic heart disease of native coronary artery with unspecified angina pectoris: Secondary | ICD-10-CM | POA: Diagnosis not present

## 2019-08-24 DIAGNOSIS — Z01810 Encounter for preprocedural cardiovascular examination: Secondary | ICD-10-CM

## 2019-08-24 DIAGNOSIS — I25110 Atherosclerotic heart disease of native coronary artery with unstable angina pectoris: Secondary | ICD-10-CM

## 2019-08-24 DIAGNOSIS — E119 Type 2 diabetes mellitus without complications: Secondary | ICD-10-CM

## 2019-08-24 LAB — BASIC METABOLIC PANEL
Anion gap: 7 (ref 5–15)
BUN: 22 mg/dL — ABNORMAL HIGH (ref 6–20)
CO2: 31 mmol/L (ref 22–32)
Calcium: 9.3 mg/dL (ref 8.9–10.3)
Chloride: 102 mmol/L (ref 98–111)
Creatinine, Ser: 1.56 mg/dL — ABNORMAL HIGH (ref 0.61–1.24)
GFR calc Af Amer: 56 mL/min — ABNORMAL LOW (ref >60)
GFR calc non Af Amer: 49 mL/min — ABNORMAL LOW (ref >60)
Glucose, Bld: 100 mg/dL — ABNORMAL HIGH (ref 70–99)
Potassium: 4.5 mmol/L (ref 3.5–5.1)
Sodium: 140 mmol/L (ref 135–145)

## 2019-08-24 LAB — LIPID PANEL
Cholesterol: 122 mg/dL (ref 0–200)
HDL: 29 mg/dL — ABNORMAL LOW (ref >40)
LDL Cholesterol: 64 mg/dL (ref 0–99)
Total CHOL/HDL Ratio: 4.2 RATIO
Triglycerides: 144 mg/dL (ref <150)
VLDL: 29 mg/dL (ref 0–40)

## 2019-08-24 LAB — URINALYSIS, ROUTINE W REFLEX MICROSCOPIC
Bilirubin Urine: NEGATIVE
Glucose, UA: 50 mg/dL — AB
Hgb urine dipstick: NEGATIVE
Ketones, ur: NEGATIVE mg/dL
Nitrite: NEGATIVE
Protein, ur: NEGATIVE mg/dL
Specific Gravity, Urine: 1.009 (ref 1.005–1.030)
pH: 7 (ref 5.0–8.0)

## 2019-08-24 LAB — GLUCOSE, CAPILLARY
Glucose-Capillary: 81 mg/dL (ref 70–99)
Glucose-Capillary: 111 mg/dL — ABNORMAL HIGH (ref 70–99)
Glucose-Capillary: 227 mg/dL — ABNORMAL HIGH (ref 70–99)
Glucose-Capillary: 170 mg/dL — ABNORMAL HIGH (ref 70–99)

## 2019-08-24 LAB — SURGICAL PCR SCREEN
MRSA, PCR: NEGATIVE
Staphylococcus aureus: NEGATIVE

## 2019-08-24 LAB — TYPE AND SCREEN
ABO/RH(D): A POS
Antibody Screen: NEGATIVE

## 2019-08-24 LAB — ABO/RH: ABO/RH(D): A POS

## 2019-08-24 LAB — MAGNESIUM: Magnesium: 2.2 mg/dL (ref 1.7–2.4)

## 2019-08-24 MED ORDER — CHLORHEXIDINE GLUCONATE CLOTH 2 % EX PADS
6.0000 | MEDICATED_PAD | Freq: Once | CUTANEOUS | Status: AC
  Administered 2019-08-24: 22:00:00 6 via TOPICAL

## 2019-08-24 MED ORDER — TEMAZEPAM 15 MG PO CAPS: 15.0000 mg | ORAL_CAPSULE | Freq: Once | ORAL | Status: DC | PRN

## 2019-08-24 MED ORDER — EPINEPHRINE HCL 5 MG/250ML IV SOLN IN NS
0.0000 ug/min | INTRAVENOUS | Status: DC
  Filled 2019-08-24: qty 250

## 2019-08-24 MED ORDER — TEMAZEPAM 15 MG PO CAPS
15.0000 mg | ORAL_CAPSULE | Freq: Once | ORAL | Status: AC
  Administered 2019-08-24: 22:00:00 15 mg via ORAL
  Filled 2019-08-24: qty 1

## 2019-08-24 MED ORDER — BISACODYL 5 MG PO TBEC
5.0000 mg | DELAYED_RELEASE_TABLET | Freq: Once | ORAL | Status: AC
  Administered 2019-08-24: 17:00:00 5 mg via ORAL
  Filled 2019-08-24: qty 1

## 2019-08-24 MED ORDER — NITROGLYCERIN IN D5W 200-5 MCG/ML-% IV SOLN
2.0000 ug/min | INTRAVENOUS | Status: DC
  Filled 2019-08-24: qty 250

## 2019-08-24 MED ORDER — POTASSIUM CHLORIDE 2 MEQ/ML IV SOLN
80.0000 meq | INTRAVENOUS | Status: DC
  Filled 2019-08-24: qty 40

## 2019-08-24 MED ORDER — VANCOMYCIN HCL 1500 MG/300ML IV SOLN
1500.0000 mg | INTRAVENOUS | Status: DC
  Filled 2019-08-24: qty 300

## 2019-08-24 MED ORDER — PLASMA-LYTE 148 IV SOLN
INTRAVENOUS | Status: DC
  Filled 2019-08-24: qty 2.5

## 2019-08-24 MED ORDER — SODIUM CHLORIDE 0.9 % IV SOLN
750.0000 mg | INTRAVENOUS | Status: DC
  Filled 2019-08-24: qty 750

## 2019-08-24 MED ORDER — SODIUM CHLORIDE 0.9 % IV SOLN
INTRAVENOUS | Status: DC
  Filled 2019-08-24: qty 30

## 2019-08-24 MED ORDER — CHLORHEXIDINE GLUCONATE 0.12 % MT SOLN
15.0000 mL | Freq: Once | OROMUCOSAL | Status: AC
  Administered 2019-08-25: 05:00:00 15 mL via OROMUCOSAL
  Filled 2019-08-24: qty 15

## 2019-08-24 MED ORDER — TRANEXAMIC ACID (OHS) BOLUS VIA INFUSION
15.0000 mg/kg | INTRAVENOUS | Status: AC
  Administered 2019-08-25: 1428 mg via INTRAVENOUS
  Filled 2019-08-24: qty 1428

## 2019-08-24 MED ORDER — SODIUM CHLORIDE 0.9 % IV SOLN
1.5000 g | INTRAVENOUS | Status: AC
  Administered 2019-08-25: .75 g via INTRAVENOUS
  Administered 2019-08-25: 1.5 g via INTRAVENOUS
  Filled 2019-08-24: qty 1.5

## 2019-08-24 MED ORDER — ISOSORBIDE MONONITRATE ER 30 MG PO TB24
30.0000 mg | ORAL_TABLET | Freq: Every day | ORAL | Status: DC
  Administered 2019-08-24: 30 mg via ORAL
  Filled 2019-08-24: qty 1

## 2019-08-24 MED ORDER — NOREPINEPHRINE 4 MG/250ML-% IV SOLN
0.0000 ug/min | INTRAVENOUS | Status: DC
  Filled 2019-08-24: qty 250

## 2019-08-24 MED ORDER — CHLORHEXIDINE GLUCONATE CLOTH 2 % EX PADS
6.0000 | MEDICATED_PAD | Freq: Once | CUTANEOUS | Status: AC
  Administered 2019-08-25: 05:00:00 6 via TOPICAL

## 2019-08-24 MED ORDER — METOPROLOL TARTRATE 12.5 MG HALF TABLET
12.5000 mg | ORAL_TABLET | Freq: Once | ORAL | Status: AC
  Administered 2019-08-25: 12.5 mg via ORAL
  Filled 2019-08-24: qty 1

## 2019-08-24 MED ORDER — TRANEXAMIC ACID 1000 MG/10ML IV SOLN
1.5000 mg/kg/h | INTRAVENOUS | Status: DC
  Filled 2019-08-24: qty 25

## 2019-08-24 MED ORDER — MILRINONE LACTATE IN DEXTROSE 20-5 MG/100ML-% IV SOLN
0.3000 ug/kg/min | INTRAVENOUS | Status: DC
  Filled 2019-08-24: qty 100

## 2019-08-24 MED ORDER — TRANEXAMIC ACID (OHS) PUMP PRIME SOLUTION
2.0000 mg/kg | INTRAVENOUS | Status: DC
  Filled 2019-08-24: qty 1.9

## 2019-08-24 MED ORDER — INSULIN REGULAR(HUMAN) IN NACL 100-0.9 UT/100ML-% IV SOLN
INTRAVENOUS | Status: DC
  Filled 2019-08-24: qty 100

## 2019-08-24 MED ORDER — PHENYLEPHRINE HCL-NACL 20-0.9 MG/250ML-% IV SOLN
30.0000 ug/min | INTRAVENOUS | Status: DC
  Filled 2019-08-24: qty 250

## 2019-08-24 MED ORDER — MAGNESIUM SULFATE 50 % IJ SOLN
40.0000 meq | INTRAMUSCULAR | Status: DC
  Filled 2019-08-24: qty 9.85

## 2019-08-24 MED ORDER — DEXMEDETOMIDINE HCL IN NACL 400 MCG/100ML IV SOLN
0.1000 ug/kg/h | INTRAVENOUS | Status: DC
  Filled 2019-08-24: qty 100

## 2019-08-24 NOTE — Anesthesia Preprocedure Evaluation (Addendum)
Anesthesia Evaluation  Patient identified by MRN, date of birth, ID band Patient awake    Reviewed: Allergy & Precautions, NPO status , Patient's Chart, lab work & pertinent test results  Airway Mallampati: II  TM Distance: >3 FB Neck ROM: Full    Dental  (+) Dental Advisory Given   Pulmonary sleep apnea ,    breath sounds clear to auscultation       Cardiovascular hypertension, Pt. on medications and Pt. on home beta blockers + angina + CAD and + Cardiac Stents   Rhythm:Regular Rate:Normal     Neuro/Psych  Neuromuscular disease    GI/Hepatic Neg liver ROS, GERD  ,  Endo/Other  diabetes, Type 2, Insulin Dependent, Oral Hypoglycemic Agents  Renal/GU CRFRenal disease     Musculoskeletal   Abdominal   Peds  Hematology negative hematology ROS (+)   Anesthesia Other Findings   Reproductive/Obstetrics                             Lab Results  Component Value Date   WBC 6.9 08/22/2019   HGB 14.6 08/22/2019   HCT 46.5 08/22/2019   MCV 87.2 08/22/2019   PLT 145 (L) 08/22/2019   Lab Results  Component Value Date   CREATININE 1.56 (H) 08/24/2019   BUN 22 (H) 08/24/2019   NA 140 08/24/2019   K 4.5 08/24/2019   CL 102 08/24/2019   CO2 31 08/24/2019    Anesthesia Physical Anesthesia Plan  ASA: IV  Anesthesia Plan: General   Post-op Pain Management:    Induction: Intravenous  PONV Risk Score and Plan: 2 and Treatment may vary due to age or medical condition  Airway Management Planned: Oral ETT  Additional Equipment: Arterial line, CVP, PA Cath, TEE and Ultrasound Guidance Line Placement  Intra-op Plan:   Post-operative Plan: Post-operative intubation/ventilation  Informed Consent: I have reviewed the patients History and Physical, chart, labs and discussed the procedure including the risks, benefits and alternatives for the proposed anesthesia with the patient or authorized  representative who has indicated his/her understanding and acceptance.     Dental advisory given  Plan Discussed with: CRNA  Anesthesia Plan Comments:        Anesthesia Quick Evaluation

## 2019-08-24 NOTE — Progress Notes (Signed)
Pre cabg has been completed.   Preliminary results in CV Proc.   Abram Sander 08/24/2019 11:29 AM

## 2019-08-24 NOTE — Progress Notes (Addendum)
Progress Note  Patient Name: John Dodson Date of Encounter: 08/24/2019   Primary Cardiologist: Glenetta Hew, MD   Subjective   Denies any CP or SOB  Inpatient Medications    Scheduled Meds: . aspirin EC  81 mg Oral Daily  . atorvastatin  40 mg Oral Daily  . bisacodyl  5 mg Oral Once  . [START ON 08/25/2019] chlorhexidine  15 mL Mouth/Throat Once  . Chlorhexidine Gluconate Cloth  6 each Topical Once   And  . Chlorhexidine Gluconate Cloth  6 each Topical Once  . enoxaparin (LOVENOX) injection  100 mg Subcutaneous Q12H  . [START ON 08/25/2019] epinephrine  0-10 mcg/min Intravenous To OR  . [START ON 08/25/2019] heparin-papaverine-plasmalyte irrigation   Irrigation To OR  . insulin aspart  0-20 Units Subcutaneous TID WC  . insulin glargine  70 Units Subcutaneous QPM  . [START ON 08/25/2019] insulin   Intravenous To OR  . losartan  25 mg Oral Daily  . magnesium oxide  600 mg Oral Daily  . [START ON 08/25/2019] magnesium sulfate  40 mEq Other To OR  . metoprolol succinate  100 mg Oral Daily  . [START ON 08/25/2019] metoprolol tartrate  12.5 mg Oral Once  . omega-3 acid ethyl esters  1,000 mg Oral BID  . pantoprazole  40 mg Oral BID AC  . [START ON 08/25/2019] phenylephrine  30-200 mcg/min Intravenous To OR  . [START ON 08/25/2019] potassium chloride  80 mEq Other To OR  . ranolazine  1,000 mg Oral BID  . sodium chloride flush  3 mL Intravenous Q12H  . [START ON 08/25/2019] tranexamic acid  15 mg/kg Intravenous To OR  . [START ON 08/25/2019] tranexamic acid  2 mg/kg Intracatheter To OR   Continuous Infusions: . sodium chloride    . sodium chloride    . [START ON 08/25/2019] cefUROXime (ZINACEF)  IV    . [START ON 08/25/2019] cefUROXime (ZINACEF)  IV    . [START ON 08/25/2019] dexmedetomidine    . [START ON 08/25/2019] heparin 30,000 units/NS 1000 mL solution for CELLSAVER    . [START ON 08/25/2019] milrinone    . nitroGLYCERIN    . [START ON 08/25/2019] nitroGLYCERIN    .  [START ON 08/25/2019] norepinephrine    . [START ON 08/25/2019] tranexamic acid (CYKLOKAPRON) infusion ([facility])    . [START ON 08/25/2019] vancomycin     PRN Meds: sodium chloride, acetaminophen, albuterol, fluorouracil, hydrocortisone cream, melatonin, morphine injection, nitroGLYCERIN, nitroGLYCERIN, ondansetron (ZOFRAN) IV, sodium chloride flush   Vital Signs    Vitals:   08/23/19 1640 08/23/19 2258 08/24/19 0321 08/24/19 0322  BP: 92/66 109/69 108/69   Pulse:  70 72   Resp:  19 19   Temp:  98.2 F (36.8 C) 97.7 F (36.5 C)   TempSrc:  Oral Oral   SpO2:  98% 95%   Weight:    95.2 kg  Height:        Intake/Output Summary (Last 24 hours) at 08/24/2019 1012 Last data filed at 08/24/2019 0734 Gross per 24 hour  Intake 702 ml  Output 2900 ml  Net -2198 ml   Last 3 Weights 08/24/2019 08/23/2019 08/21/2019  Weight (lbs) 209 lb 14.4 oz 211 lb 6.4 oz 214 lb 8.1 oz  Weight (kg) 95.21 kg 95.89 kg 97.3 kg      Telemetry    Paced rhythm - Personally Reviewed  ECG    Paced rhythm - Personally Reviewed  Physical Exam   GEN:  No acute distress.   Neck: No JVD Cardiac: RRR, no murmurs, rubs, or gallops.  Respiratory: Clear to auscultation bilaterally. GI: Soft, nontender, non-distended  MS: No edema; No deformity. Neuro:  Nonfocal  Psych: Normal affect   Labs    High Sensitivity Troponin:  No results for input(s): TROPONINIHS in the last 720 hours.    Chemistry Recent Labs  Lab 08/21/19 0344 08/23/19 0321 08/24/19 0331  NA 141 139 140  K 4.1 4.1 4.5  CL 107 104 102  CO2 26 27 31   GLUCOSE 182* 147* 100*  BUN 20 24* 22*  CREATININE 1.46* 1.57* 1.56*  CALCIUM 9.1 9.0 9.3  GFRNONAA 53* 48* 49*  GFRAA >60 56* 56*  ANIONGAP 8 8 7      Hematology Recent Labs  Lab 08/17/19 1038 08/21/19 0344 08/22/19 0754  WBC 5.4 8.9 6.9  RBC 5.16 4.81 5.33  HGB 14.0 12.9* 14.6  HCT 43.6 41.2 46.5  MCV 85 85.7 87.2  MCH 27.1 26.8 27.4  MCHC 32.1 31.3 31.4  RDW 14.7 14.6 14.8    PLT 149* 134* 145*    BNPNo results for input(s): BNP, PROBNP in the last 168 hours.   DDimer No results for input(s): DDIMER in the last 168 hours.   Radiology    No results found.  Cardiac Studies   Cath 08/20/2019  The left ventricular systolic function is normal. The left ventricular ejection fraction is 55-65% by visual estimate. LV end diastolic pressure is normal.LV end diastolic pressure is normal.  -------------------  There is stent from ostial to almost distal small nondominant RCA: Ost RCA to Mid RCA stent is diffusely 35% stenosed.  Ost Cx to Prox Cx lesion is 60% stenosed.  Ost LAD to Mid LAD stent is 10% stenosed. Mid LAD to Dist LAD lesion is 60% stenosed.  Previously placed 2nd Mrg stent (DES) is widely patent.  Lat 2nd Mrg lesion is 80% stenosed. Previously described.  Previously placed 1st LPL stent (DES) is widely patent.  LPAV lesion is 50% stenosed.  LPDA stent is 5% stenosed.    Multivessel CAD with essentially patent stents in proximal to mid LAD, proximal OM1, proximal LPL1, several stents in the L PDA as well as RCA that is a small nondominant vessel. No obvious lesions within stents noted.  Ostial LCx roughly 60%-->highly DFR positive with 0.84  Long mid LAD 60% after stent--highly DFR +0.67-0.73  Normal LVEDP  With significant LAD as well as ostial LCx disease--best course of action is CVTS consultation for CABG.   Echo 08/21/2019 1. Left ventricular ejection fraction, by estimation, is 55 to 60%. The  left ventricle has normal function. Left ventricular endocardial border  not optimally defined to evaluate regional wall motion. There is moderate  concentric left ventricular  hypertrophy. Left ventricular diastolic parameters are consistent with  Grade I diastolic dysfunction (impaired relaxation).  2. Right ventricular systolic function is normal. The right ventricular  size is normal. There is borderline elevated pulmonary  artery systolic  pressure. The estimated right ventricular systolic pressure is [id number] mmHg.  3. The mitral valve is normal in structure. Trivial mitral valve  regurgitation. No evidence of mitral stenosis.  4. Tricuspid valve regurgitation is mild to moderate.  5. The aortic valve is normal in structure. Aortic valve regurgitation is  not visualized. No aortic stenosis is present.  6. The inferior vena cava is normal in size with greater than 50%  respiratory variability, suggesting right atrial pressure of 3 mmHg.  Patient Profile     59 y.o. male with PMH of CAD, bilateral PE in the setting of COVID 65, CKD stage III, DM II, HLD, HTN, and CHB s/p pacemaker who presented with progressive angina and found to have multivessel CAD and plan for CABG.  Assessment & Plan    1. CAD  - Cath 08/20/2019 60% ost LAD with positive DFR, 60% ost LCx with positive DFR, 80% OM2 with patent OM2 stent, LPL stent patent, 50% LPAV.   - Echo 08/21/2019 EF 55-60%, grade 1 DD, RV SP 34.8 mmHg  - pending CABG tomorrow  2. Pulmonary embolism: diagnosed in Jan 2021 following COVID 19 infection  3. HTN: BP normal on losartan and metoprolol  4. HLD: on lipitor  5. DM II  6. CKD stage III: Cr mildly elevated, however stable when compare to yesterday  7. OSA on BiPAP  8. CHB s/p PPM  For questions or updates, please contact [facility] Please consult [email or web address] for contact info under        Signed, Almyra Deforest, [place]  08/24/2019, 10:12 AM     Patient seen and examined. Agree with assessment and plan.  Patient feels well presently.  However he states yesterday while walking he developed recurrent chest pain and required 2 sublingual nitroglycerin for ultimate relief.  Telemetry reveals ventricular paced rhythm in the 70s.  He has been on metoprolol succinate 100 mg daily in addition to ranolazine 1000 mg twice a day.  Will add isosorbide initially at 30 mg.  If recurrent chest pain today we will  start IV nitroglycerin.  Plan is for CABG revascularization tomorrow morning with Dr. Orvan Seen.   Troy Sine, MD, [facility] 08/24/2019 11:59 AM

## 2019-08-24 NOTE — Consult Note (Signed)
[address]       [place] [address]             [id number]        Raad D Sitts [facility] Medical Record [id number] Date of Birth: 1960-09-09  Referring: No ref. provider found Primary Care: Kristopher Glee., MD Primary Cardiologist:David Ellyn Hack, MD  Chief Complaint:   No chief complaint on file.  Chest pain History of Present Illness:     59 yo man with known CAD s/p PCI was in [place] until last week when he developed resting chest pain that was sharp and radiated to bilateral UEs and to jaw. He took NTG sl which helped some but ultimately presented for evluation. Given his history and sudden change in sx, he was taken to the cath lab where severe 3V CAD was noted. He is referred for CABG. Has been awaiting surgery due to prior anticoagulants. Denies chest pain/pressure today, but he did experience pain yesterday while ambulating in hallway.    Current Activity/ Functional Status: Patient will be independent with mobility/ambulation, transfers, ADL's, IADL's.   Zubrod Score: At the time of surgery this patient's most appropriate activity status/level should be described as: []     0    Normal activity, no symptoms []     1    Restricted in physical strenuous activity but ambulatory, able to do out light work []     2    Ambulatory and capable of self care, unable to do work activities, up and about                 more than 50%  Of the time                            []     3    Only limited self care, in bed greater than 50% of waking hours []     4    Completely disabled, no self care, confined to bed or chair []     5    Moribund  Past Medical History:  Diagnosis Date  . Blepharitis of both eyes    Chronic  . Cataracts, both eyes   . CKD (chronic kidney disease) stage 3, GFR 30-59 ml/min    Status post right nephrectomy/adrenalectomy and diabetic nephropathy (baseline creatinine 1.4-1.5)  . Coronary artery disease involving native coronary artery  2015   (No cath report prior to 2016 noted, but as of 2016, had stents in LAD, OM and L PDA) as documented since now in proximal LAD, OM 2-2 stents, L PDA at least 3 if not 4 stents, and also now nondominant RCA.  Marland Kitchen COVID-19 virus infection 04/25/2019  . Diabetic peripheral neuropathy associated with type 2 diabetes mellitus ([place])   . Essential hypertension   . GERD without esophagitis   . History of basal cell carcinoma (BCC) excision   . History of complete heart block 06/2016   Status post pacemaker placement  . History of non-ST elevation myocardial infarction (NSTEMI)    And several occasions of unstable angina  . Hyperlipidemia associated with type 2 diabetes mellitus [facility])    Per PCP note in February 2021, was on atorvastatin 40 mg.  . Obesity (BMI 30.0-34.9)   . Occlusion of left vertebral artery    Consider repossible acute lesion in August 2020 with presentation of headache.  CTA suggested occluded left vertebral artery throughout the neck.  Faint string-like enhancement intermittently visible suggesting recent occlusion.  Partial reconstruction and posterior fossa.  Left PICA patent.  (Consider possible left vertebral artery dissection)  . OSA treated with BiPAP 02/2019   Diagnosis in late 2020 ([facility])--> delayed onset of treatment with BiPAP due to Covid hospitalization followed by PE. ->  BiPAP setting at 21/17 cm  . Pulmonary embolism ([place]) 05/18/2019   (1 month following COVID-19 infection): DVT-PE (bilateral PEs noted on VQ scan)-> started on warfarin.  (On warfarin plus Plavix now with aspirin stopped.)  . Type 2 diabetes mellitus with complication, with long-term current use of insulin (HCC)    On Lantus, Jardiance, Metformin & Onglyza    Past Surgical History:  Procedure Laterality Date  . BASAL CELL CARCINOMA EXCISION  01/2015  . CAROTID DUPLEX SCAN  12/25/2018   [facility]: Mild plaque in both carotids.  139% bilateral.  Right vertebral flow  normal antegrade, Left not seen; normal bilateral subclavian flow..  . CHOLECYSTECTOMY    . CORONARY STENT INTERVENTION  04/10/2015   ([place]; Bishop Limbo, DO) -> DES PCI mLPDA: Xience Alpine DES 2.5 mm x 18 mm, Xience Alpine DES 2.25 mm x 12 mm overlapping.  . CORONARY STENT INTERVENTION  04/04/2016   ([place]; [place], MD)--> (urgent) 100% mLPDA PTCA with 2.25 mm balloon - reduced to 50%; mid OM2 90% - DES PCI (Xience DES  2.5 x 18).   . CORONARY STENT INTERVENTION  2015   Prior to December 2016, STENTS noted in prox LAD, proximal OM1, and at least 2 stents in proximal L PDA  . CORONARY STENT INTERVENTION  11/17/2018   ([place]; Bishop Limbo, DO): CULPRIT: mid 90% (DES PCI) -Resolute Onyx DES 2.5 mm x 30 mm --> COMPLICATION: Large left arm hematoma-evaluated by vascular and orthopedic surgery, managed with splint and arm elevation.  . CORONARY STENT INTERVENTION  12/28/2018   ([place]; Clarene Critchley, MD, referred by Dr. Mathis Bud) --> INDICATION (urgent, Unstable Angina): Moderate-severe (70%) stenosis of ostial RCA  -> DES PCI Resolute Onyx DES 2.5 mm x 12 mm.   Marland Kitchen LEFT HEART CATH AND CORONARY ANGIOGRAPHY  04/10/2015   ([place]; Bishop Limbo, DO)--> EF 55%.  Mild inferior HK.  Coronaries-LM: Normal, p LAD STENT 30% ISR, mLAD 20% & diffuse dLAD ~20%; Dom LCx: mCx 20%, ~pOM1 STENT (noted as OM2 in other reports) patent with mid 30%, OM2 normal, prox LPDA "STENTS" patent with SEVERE mid L PDA 90-95% (DES PCI x 2 overlapping); Small-non-dominant RCA  patent   . LEFT HEART CATH AND CORONARY ANGIOGRAPHY  04/04/2016   ([place]; [place], MD)--> (urgent): EF 70%.  Previous LAD,~OM2 and proximal PDA stents patent; -> mLAD 35%, dLAD 40%; LCx-OM1 75% (short lesion, med Rx), mid OM2 90% (DES PCI), pLPDA stents patent w/ mPDA 100% (PTCA only - reduced to 50%); non-dom RCA - ost RCA 60% & mRCA 75%  . LEFT  HEART CATH AND CORONARY ANGIOGRAPHY  11/17/2018   ([place]; Bishop Limbo, DO) indication: Angina.  LM normal; LAD - ost LAD ~50%, pLAD STENT ~30% ISR with dLAD ~40%; Dom LCx - ost & prox Cx 30%, OM1 STENT 20% ISR, OM2 STENT 50% distal edge, pLPDA overlapping STENTS ~20%; nonDom RCA - proxRCA 40%, mid 90% (DES PCI)  . LEFT HEART CATH AND CORONARY ANGIOGRAPHY  12/28/2018   [facility]; Clarene Critchley, MD, referred by Dr. Mathis Bud) --> INDICATION (  urgent, Unstable Angina): Moderate-severe (70%) stenosis of ostial RCA (DES PCI), mRCA 30%.  Otherwise no significant change from July 2020: dLM 20%, pLAD ~40% ISR , dLAD long/diffuse ~50%; OstLCx 30%, OM1 ~15% OM2 ~30% with patent  LPDA stents/PTCA site.   Marland Kitchen LEFT HEART CATH AND CORONARY ANGIOGRAPHY N/A 08/20/2019   Procedure: LEFT HEART CATH AND CORONARY ANGIOGRAPHY;  Surgeon: Leonie Man, MD;  Location: [facility];  Service: Cardiovascular;  Laterality: N/A;  . NEPHRECTOMY Right 2012   With adrenalectomy  . NM MYOVIEW LTD  08/20/2018   [facility] -> Lexiscan Myoview: EF 81%.  No reversible ischemia or infarction.  Normal wall motion.  Marland Kitchen PACEMAKER IMPLANT  06/2016   [facility] [place] ([employer]) -> according to CT of chest, leads positioned in right atrium, cardiac apex and coronary sinus (suggesting CRT-P - BiV Pacing))  . TRANSTHORACIC ECHOCARDIOGRAM  11/2018; 05/01/2019   ([place]) a) EF 60-65%.  Mild TR.;; b)moderate concentric LVH.  EF 55 to 60%.  No R WMA.  Normal RV size and function.  Normal atrial sizes.  Normal valves.    Social History   Tobacco Use  Smoking Status Never Smoker  Smokeless Tobacco Never Used    Social History   Substance and Sexual Activity  Alcohol Use No     Allergies  Allergen Reactions  . Latex Rash  . Codeine Nausea And Vomiting  . Contrast Media [Iodinated Diagnostic Agents]     Reportedly cardiac arrest  . Integrilin  [Eptifibatide]     Reportedly had shortness of breath, confusion.  . Tylenol [Acetaminophen]     Tylenol -3 with codiene  . Zithromax [Azithromycin] Nausea And Vomiting  . Glipizide Rash    Headache    Current Facility-Administered Medications  Medication Dose Route Frequency Provider Last Rate Last Admin  . 0.9 %  sodium chloride infusion   Intravenous Continuous Leonie Man, MD      . 0.9 %  sodium chloride infusion  250 mL Intravenous PRN Leonie Man, MD      . acetaminophen (TYLENOL) tablet 650 mg  650 mg Oral Q4H PRN Leonie Man, MD   650 mg at 08/21/19 2103  . albuterol (PROVENTIL) (2.5 MG/3ML) 0.083% nebulizer solution 2.5 mg  2.5 mg Nebulization Q4H PRN Leonie Man, MD      . aspirin EC tablet 81 mg  81 mg Oral Daily Bhagat, Bhavinkumar, PA   81 mg at 08/24/19 0930  . atorvastatin (LIPITOR) tablet 40 mg  40 mg Oral Daily Leonie Man, MD   40 mg at 08/24/19 0930  . [START ON 08/25/2019] cefUROXime (ZINACEF) 1.5 g in sodium chloride 0.9 % 100 mL IVPB  1.5 g Intravenous To OR Nikoletta Varma, Glenice Bow, MD      . Derrill Memo ON 08/25/2019] cefUROXime (ZINACEF) 750 mg in sodium chloride 0.9 % 100 mL IVPB  750 mg Intravenous To OR Nyheim Seufert, Glenice Bow, MD      . Derrill Memo ON 08/25/2019] chlorhexidine (PERIDEX) 0.12 % solution 15 mL  15 mL Mouth/Throat Once Dejha King, Glenice Bow, MD      . Chlorhexidine Gluconate Cloth 2 % PADS 6 each  6 each Topical Once [name], MD       And  . Chlorhexidine Gluconate Cloth 2 % PADS 6 each  6 each Topical Once [name], MD      . Derrill Memo ON 08/25/2019] dexmedetomidine (PRECEDEX) 400 MCG/100ML (4 mcg/mL)  infusion  0.1-0.7 mcg/kg/hr Intravenous To OR [name], MD      . Derrill Memo ON 08/25/2019] EPINEPHrine (ADRENALIN) 4 mg in NS 250 mL (0.016 mg/mL) premix infusion  0-10 mcg/min Intravenous To OR Anayi Bricco, Glenice Bow, MD      . fluorouracil (EFUDEX) 5 % cream 1 application  1 application Topical BID PRN Leonie Man, MD      . Derrill Memo  ON 08/25/2019] heparin 30,000 units/NS 1000 mL solution for CELLSAVER   Other To OR [name], MD      . Derrill Memo ON 08/25/2019] heparin sodium (porcine) 2,500 Units, papaverine 30 mg in electrolyte-148 (PLASMALYTE-148) 500 mL irrigation   Irrigation To OR Jaritza Duignan, Glenice Bow, MD      . hydrocortisone cream 1 % 1 application  1 application Topical BID PRN Leonie Man, MD      . insulin aspart (novoLOG) injection 0-20 Units  0-20 Units Subcutaneous TID WC Leonie Man, MD   7 Units at 08/24/19 1704  . insulin glargine (LANTUS) injection 70 Units  70 Units Subcutaneous QPM Coniglio, Alcario Drought, MD   70 Units at 08/24/19 1703  . [START ON 08/25/2019] insulin regular, human (MYXREDLIN) 100 units/ 100 mL infusion   Intravenous To OR Chessie Neuharth Z, MD      . isosorbide mononitrate (IMDUR) 24 hr tablet 30 mg  30 mg Oral Daily Troy Sine, MD   30 mg at 08/24/19 1220  . losartan (COZAAR) tablet 25 mg  25 mg Oral Daily Troy Sine, MD   25 mg at 08/24/19 0931  . magnesium oxide (MAG-OX) tablet 600 mg  600 mg Oral Daily Leonie Man, MD   600 mg at 08/24/19 0930  . [START ON 08/25/2019] magnesium sulfate (IV Push/IM) injection 40 mEq  40 mEq Other To OR Maryalice Pasley, Glenice Bow, MD      . melatonin tablet 5 mg  5 mg Oral QHS PRN Coniglio, Alcario Drought, MD   5 mg at 08/23/19 2156  . metoprolol succinate (TOPROL-XL) 24 hr tablet 100 mg  100 mg Oral Daily Leonie Man, MD   100 mg at 08/24/19 0931  . [START ON 08/25/2019] metoprolol tartrate (LOPRESSOR) tablet 12.5 mg  12.5 mg Oral Once [name], MD      . Derrill Memo ON 08/25/2019] milrinone (PRIMACOR) 20 MG/100 ML (0.2 mg/mL) infusion  0.3 mcg/kg/min Intravenous To OR Marilea Gwynne, Glenice Bow, MD      . morphine 4 MG/ML injection 4 mg  4 mg Intravenous Q2H PRN Leonie Man, MD      . nitroGLYCERIN (NITROSTAT) SL tablet 0.4 mg  0.4 mg Sublingual Q5 min PRN Leonie Man, MD   0.4 mg at 08/20/19 1219  . nitroGLYCERIN (NITROSTAT) SL tablet 0.4 mg   0.4 mg Sublingual Q5 min PRN Leonie Man, MD   0.4 mg at 08/23/19 1611  . nitroGLYCERIN 50 mg in dextrose 5 % 250 mL (0.2 mg/mL) infusion  0-200 mcg/min Intravenous Titrated Leonie Man, MD      . Derrill Memo ON 08/25/2019] nitroGLYCERIN 50 mg in dextrose 5 % 250 mL (0.2 mg/mL) infusion  2-200 mcg/min Intravenous To OR [name], MD      . Derrill Memo ON 08/25/2019] norepinephrine (LEVOPHED) 4mg  in 260mL premix infusion  0-40 mcg/min Intravenous To OR Christol Thetford, Glenice Bow, MD      . omega-3 acid ethyl esters (LOVAZA) capsule 1,000 mg  1,000 mg Oral BID Ellyn Hack,  Leonie Green, MD   1,000 mg at 08/24/19 0931  . ondansetron (ZOFRAN) injection 4 mg  4 mg Intravenous Q6H PRN Leonie Man, MD      . pantoprazole (PROTONIX) EC tablet 40 mg  40 mg Oral BID AC Leonie Man, MD   40 mg at 08/24/19 0931  . [START ON 08/25/2019] phenylephrine (NEOSYNEPHRINE) 20-0.9 MG/250ML-% infusion  30-200 mcg/min Intravenous To OR [name], MD      . Derrill Memo ON 08/25/2019] potassium chloride injection 80 mEq  80 mEq Other To OR Pinchas Reither, Glenice Bow, MD      . ranolazine (RANEXA) 12 hr tablet 1,000 mg  1,000 mg Oral BID Leonie Man, MD   1,000 mg at 08/24/19 0929  . sodium chloride flush (NS) 0.9 % injection 3 mL  3 mL Intravenous Q12H Leonie Man, MD   3 mL at 08/23/19 2155  . sodium chloride flush (NS) 0.9 % injection 3 mL  3 mL Intravenous PRN Leonie Man, MD      . Derrill Memo ON 08/25/2019] tranexamic acid (CYKLOKAPRON) 2,500 mg in sodium chloride 0.9 % 250 mL (10 mg/mL) infusion  1.5 mg/kg/hr Intravenous To OR [name], MD      . Derrill Memo ON 08/25/2019] tranexamic acid (CYKLOKAPRON) bolus via infusion - over 30 minutes 1,428 mg  15 mg/kg Intravenous To OR [name], MD      . Derrill Memo ON 08/25/2019] tranexamic acid (CYKLOKAPRON) pump prime solution 190 mg  2 mg/kg Intracatheter To OR Una Yeomans, Glenice Bow, MD      . Derrill Memo ON 08/25/2019] vancomycin (VANCOREADY) IVPB 1500 mg/300 mL  1,500 mg  Intravenous To OR Nafeesah Lapaglia, Glenice Bow, MD        Medications Prior to Admission  Medication Sig Dispense Refill Last Dose  . atorvastatin (LIPITOR) 40 MG tablet Take 40 mg by mouth daily.   08/19/2019 at Unknown time  . calcium-vitamin D (OSCAL WITH D) 500-200 MG-UNIT tablet Take 1 tablet by mouth daily. 50 MCG (2000UNIT ) TAB CHOLECALCIFEROL    08/20/2019 at 0400  . clopidogrel (PLAVIX) 75 MG tablet Take 75 mg by mouth daily.   08/20/2019 at 0400  . diphenhydrAMINE (BENADRYL) 50 MG tablet Take 50 mg by mouth once.   08/20/2019 at [place]  . enoxaparin (LOVENOX) 100 MG/ML injection Inject 100 mg into the skin every 12 (twelve) hours.    08/19/2019 at 0700  . fluorouracil (EFUDEX) 5 % cream Apply 1 application topically 2 (two) times daily as needed (irritation).    08/19/2019 at Unknown time  . hydrocortisone 2.5 % lotion Apply 1 application topically 2 (two) times daily as needed (irritation).    08/19/2019 at Unknown time  . insulin glargine (LANTUS SOLOSTAR) 100 UNIT/ML Solostar Pen Inject 175 Units into the skin every evening. 100UNITS/3 ML    08/19/2019 at Unknown time  . losartan (COZAAR) 50 MG tablet Take 50 mg by mouth daily.   08/20/2019 at 0400  . MAGNESIUM OXIDE PO Take 500 mg by mouth daily. TAKE 1  TABLET BY MOUTH NIGHTLY FOR LEG CRAMPS    08/19/2019 at Unknown time  . metFORMIN (GLUCOPHAGE) 500 MG tablet Take 500 mg by mouth 2 (two) times daily with a meal.   Past Week at Unknown time  . metoprolol succinate (TOPROL-XL) 100 MG 24 hr tablet Take 1 tablet (100 mg total) by mouth daily. Take with or immediately following a meal. 90 tablet 3 08/20/2019 at 0400  .  NITROSTAT 0.4 MG SL tablet Place 0.4 mg under the tongue every 5 (five) minutes as needed for chest pain.    08/19/2019 at Unknown time  . Omega-3 Fatty Acids (FISH OIL BURP-LESS) 1000 MG CAPS Take 1,000 mg by mouth 2 (two) times daily.   08/19/2019 at Unknown time  . pantoprazole (PROTONIX) 40 MG tablet Take 40 mg by mouth 2 (two) times daily  before a meal.    08/20/2019 at 0400  . predniSONE (DELTASONE) 50 MG tablet Take 50 mg tablet at 8 pm night before and take 50 mg tablet at 2 am the morning of procedure and before you leave home take 50 mg tablet with 50 mg of Benadryl 3 tablet 0 08/20/2019 at Unknown time  . ranolazine (RANEXA) 1000 MG SR tablet Take 1,000 mg by mouth 2 (two) times daily. Take 1 tablet twice a day   Past Week at Unknown time  . triamcinolone cream (KENALOG) 0.1 % Apply 1 application topically 2 (two) times daily as needed (irritation).    Past Week at Unknown time  . VOLTAREN 1 % GEL Apply 2 g topically daily as needed (pain).    Past Week at Unknown time  . warfarin (COUMADIN) 5 MG tablet Take 5-7.5 mg by mouth See admin instructions. 5 mg on Tues, 7.5 mg all other days   08/14/2019  . albuterol (VENTOLIN HFA) 108 (90 Base) MCG/ACT inhaler Inhale 2 puffs into the lungs every 4 (four) hours as needed for wheezing or shortness of breath.   More than a month at Unknown time    Family History  Problem Relation Age of Onset  . Stroke Mother   . Hypertension Mother   . Hyperlipidemia Mother   . Hypertension Father   . Hyperlipidemia Father   . Coronary artery disease Father      Review of Systems:   ROS A comprehensive review of systems was negative.     Cardiac Review of Systems: Y or  [    ]= no  Chest Pain [    ]  Resting SOB [   ] Exertional SOB  [  ]  Orthopnea [  ]   Pedal Edema [   ]    Palpitations [  ] Syncope  [  ]   Presyncope [   ]  General Review of Systems: [Y] = yes [  ]=no Constitional: recent weight change [  ]; anorexia [  ]; fatigue [  ]; nausea [  ]; night sweats [  ]; fever [  ]; or chills [  ]                                                               Dental: Last Dentist visit:   Eye : blurred vision [  ]; diplopia [   ]; vision changes [  ];  Amaurosis fugax[  ]; Resp: cough [  ];  wheezing[  ];  hemoptysis[  ]; shortness of breath[  ]; paroxysmal nocturnal dyspnea[  ]; dyspnea on  exertion[  ]; or orthopnea[  ];  GI:  gallstones[  ], vomiting[  ];  dysphagia[  ]; melena[  ];  hematochezia [  ]; heartburn[  ];   Hx of  Colonoscopy[  ]; GU:  kidney stones [  ]; hematuria[  ];   dysuria [  ];  nocturia[  ];  history of     obstruction [  ]; urinary frequency [  ]             Skin: rash, swelling[  ];, hair loss[  ];  peripheral edema[  ];  or itching[  ]; Musculosketetal: myalgias[  ];  joint swelling[  ];  joint erythema[  ];  joint pain[  ];  back pain[  ];  Heme/Lymph: bruising[  ];  bleeding[  ];  anemia[  ];  Neuro: TIA[  ];  headaches[  ];  stroke[  ];  vertigo[  ];  seizures[  ];   paresthesias[  ];  difficulty walking[  ];  Psych:depression[  ]; anxiety[  ];  Endocrine: diabetes[  ];  thyroid dysfunction[  ];              Physical Exam: BP 124/63 (BP Location: Right Arm)   Pulse 70   Temp 97.7 F (36.5 C) (Oral)   Resp 19   Ht 5\' 8"  (1.727 m)   Wt 95.2 kg   SpO2 100%   BMI 31.92 kg/m    General appearance: alert and cooperative Head: Normocephalic, without obvious abnormality, atraumatic Resp: clear to auscultation bilaterally Cardio: regular rate and rhythm, S1, S2 normal, no murmur, click, rub or gallop GI: soft, non-tender; bowel sounds normal; no masses,  no organomegaly Extremities: extremities normal, atraumatic, no cyanosis or edema Neurologic: Alert and oriented X 3, normal strength and tone. Normal symmetric reflexes. Normal coordination and gait  Diagnostic Studies & Laboratory data:     Recent Radiology Findings:   VAS US DOPPLER PRE CABG  Result Date: 08/24/2019 PREOPERATIVE VASCULAR EVALUATION  Indications:      Pre-CABG. Risk Factors:     Hypertension, hyperlipidemia, Diabetes, coronary artery                   disease. Comparison Study: no prior Performing Technologist: Abram Sander RVS  Examination Guidelines: A complete evaluation includes B-mode imaging, spectral Doppler, color Doppler, and power Doppler as needed of all accessible  portions of each vessel. Bilateral testing is considered an integral part of a complete examination. Limited examinations for reoccurring indications may be performed as noted.  Right Carotid Findings: +----------+--------+--------+--------+------------+--------+           PSV cm/sEDV cm/sStenosisDescribe    Comments +----------+--------+--------+--------+------------+--------+ CCA Prox  63      11              heterogenous         +----------+--------+--------+--------+------------+--------+ CCA Distal54      14              heterogenous         +----------+--------+--------+--------+------------+--------+ ICA Prox  49      15      1-39%   heterogenous         +----------+--------+--------+--------+------------+--------+ ICA Distal58      21                                   +----------+--------+--------+--------+------------+--------+ ECA       51      8                                    +----------+--------+--------+--------+------------+--------+  Portions of this table do not appear on this page. +----------+--------+-------+--------+------------+           PSV cm/sEDV cmsDescribeArm Pressure +----------+--------+-------+--------+------------+ Subclavian80                     144          +----------+--------+-------+--------+------------+ +---------+--------+--+--------+--+---------+ VertebralPSV cm/s51EDV cm/s18Antegrade +---------+--------+--+--------+--+---------+ Left Carotid Findings: +----------+--------+--------+--------+------------+--------+           PSV cm/sEDV cm/sStenosisDescribe    Comments +----------+--------+--------+--------+------------+--------+ CCA Prox  65      13              heterogenous         +----------+--------+--------+--------+------------+--------+ CCA Distal67      19              heterogenous         +----------+--------+--------+--------+------------+--------+ ICA Prox  64      19      1-39%    heterogenous         +----------+--------+--------+--------+------------+--------+ ICA Distal68      24                                   +----------+--------+--------+--------+------------+--------+ ECA       69      12                                   +----------+--------+--------+--------+------------+--------+ +----------+--------+--------+--------+------------+ SubclavianPSV cm/sEDV cm/sDescribeArm Pressure +----------+--------+--------+--------+------------+           114                     139          +----------+--------+--------+--------+------------+ +---------+--------+--------+--------+ VertebralPSV cm/sEDV cm/soccluded +---------+--------+--------+--------+  ABI Findings: +--------+------------------+-----+---------+--------+ Right   Rt Pressure (mmHg)IndexWaveform Comment  +--------+------------------+-----+---------+--------+ [id number]                    triphasic         +--------+------------------+-----+---------+--------+ PTA     135               0.94 biphasic          +--------+------------------+-----+---------+--------+ DP      125               0.87 biphasic          +--------+------------------+-----+---------+--------+ +--------+------------------+-----+---------+-------+ Left    Lt Pressure (mmHg)IndexWaveform Comment +--------+------------------+-----+---------+-------+ [id number]                    triphasic        +--------+------------------+-----+---------+-------+ PTA     115               0.80 triphasic        +--------+------------------+-----+---------+-------+ DP      91                0.63 biphasic         +--------+------------------+-----+---------+-------+ +-------+---------------+----------------+ ABI/TBIToday's ABI/TBIPrevious ABI/TBI +-------+---------------+----------------+ Right  0.94                            +-------+---------------+----------------+ Left   0.80                             +-------+---------------+----------------+  Right Doppler Findings: +--------+--------+-----+---------+--------+ Site    PressureIndexDoppler  Comments +--------+--------+-----+---------+--------+ [id number]          triphasic         +--------+--------+-----+---------+--------+ Radial               biphasic          +--------+--------+-----+---------+--------+ Ulnar                biphasic          +--------+--------+-----+---------+--------+  Left Doppler Findings: +--------+--------+-----+---------+--------+ Site    PressureIndexDoppler  Comments +--------+--------+-----+---------+--------+ [id number]          triphasic         +--------+--------+-----+---------+--------+ Radial               triphasic         +--------+--------+-----+---------+--------+ Ulnar                biphasic          +--------+--------+-----+---------+--------+  Summary: Right Carotid: Velocities in the right ICA are consistent with a 1-39% stenosis. Left Carotid: Velocities in the left ICA are consistent with a 1-39% stenosis. Vertebrals: Right vertebral artery demonstrates antegrade flow. Left vertebral             artery demonstrates an occlusion. Right ABI: Resting right ankle-brachial index indicates mild right lower extremity arterial disease. Left ABI: Resting left ankle-brachial index indicates mild left lower extremity arterial disease. Right Upper Extremity: Doppler waveforms remain within normal limits with right radial compression. Doppler waveform obliterate with right ulnar compression. Left Upper Extremity: Doppler waveforms decrease 50% with left radial compression. Doppler waveform obliterate with left ulnar compression.  Electronically signed by Deitra Mayo MD on 08/24/2019 at 3:55:53 PM.    Final      I have independently reviewed the above radiologic studies and discussed with the patient   Recent Lab Findings: Lab Results  Component  Value Date   WBC 6.9 08/22/2019   HGB 14.6 08/22/2019   HCT 46.5 08/22/2019   PLT 145 (L) 08/22/2019   GLUCOSE 100 (H) 08/24/2019   CHOL 122 08/24/2019   TRIG 144 08/24/2019   HDL 29 (L) 08/24/2019   LDLCALC 64 08/24/2019   NA 140 08/24/2019   K 4.5 08/24/2019   CL 102 08/24/2019   CREATININE 1.56 (H) 08/24/2019   BUN 22 (H) 08/24/2019   CO2 31 08/24/2019   INR 1.0 08/20/2019   HGBA1C 7.6 (H) 08/21/2019      Assessment / Plan:     59 yo man with long standing CAD 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 He is a good candidate for surgery. He wishes to proceed with surgery which will be performed on 08/25/19. I have explained the risks and benefits of the procedure and he expresses understanding.      I  spent 40 minutes counseling the patient face to face.   Abie Cheek Z. Orvan Seen, MD [phone] 08/24/2019 6:30 PM

## 2019-08-25 ENCOUNTER — Inpatient Hospital Stay (HOSPITAL_COMMUNITY): Payer: Medicare Other

## 2019-08-25 ENCOUNTER — Inpatient Hospital Stay (HOSPITAL_COMMUNITY): Payer: Medicare Other | Admitting: Certified Registered Nurse Anesthetist

## 2019-08-25 ENCOUNTER — Encounter (HOSPITAL_COMMUNITY)
Admission: AD | Disposition: A | Discharge status: Home / Self Care | Payer: Self-pay | Source: Home / Self Care | Attending: Cardiothoracic Surgery

## 2019-08-25 DIAGNOSIS — Z951 Presence of aortocoronary bypass graft: Secondary | ICD-10-CM

## 2019-08-25 DIAGNOSIS — I2510 Atherosclerotic heart disease of native coronary artery without angina pectoris: Secondary | ICD-10-CM | POA: Diagnosis present

## 2019-08-25 DIAGNOSIS — I25110 Atherosclerotic heart disease of native coronary artery with unstable angina pectoris: Secondary | ICD-10-CM | POA: Diagnosis not present

## 2019-08-25 HISTORY — PX: CORONARY ARTERY BYPASS GRAFT: SHX141

## 2019-08-25 HISTORY — DX: Presence of aortocoronary bypass graft: Z95.1

## 2019-08-25 HISTORY — PX: TEE WITHOUT CARDIOVERSION: SHX5443

## 2019-08-25 HISTORY — PX: RADIAL ARTERY HARVEST: SHX5067

## 2019-08-25 LAB — CBC
HCT: 31.9 % — ABNORMAL LOW (ref 39.0–52.0)
HCT: 30.7 % — ABNORMAL LOW (ref 39.0–52.0)
Hemoglobin: 10.3 g/dL — ABNORMAL LOW (ref 13.0–17.0)
Hemoglobin: 9.8 g/dL — ABNORMAL LOW (ref 13.0–17.0)
MCH: 27.8 pg (ref 26.0–34.0)
MCH: 27.4 pg (ref 26.0–34.0)
MCHC: 32.3 g/dL (ref 30.0–36.0)
MCHC: 31.9 g/dL (ref 30.0–36.0)
MCV: 86.2 fL (ref 80.0–100.0)
MCV: 85.8 fL (ref 80.0–100.0)
Platelets: 148 10*3/uL — ABNORMAL LOW (ref 150–400)
Platelets: 90 10*3/uL — ABNORMAL LOW (ref 150–400)
RBC: 3.7 MIL/uL — ABNORMAL LOW (ref 4.22–5.81)
RBC: 3.58 MIL/uL — ABNORMAL LOW (ref 4.22–5.81)
RDW: 14.6 % (ref 11.5–15.5)
RDW: 14.6 % (ref 11.5–15.5)
WBC: 14.2 10*3/uL — ABNORMAL HIGH (ref 4.0–10.5)
WBC: 8.2 10*3/uL (ref 4.0–10.5)
nRBC: 0 % (ref 0.0–0.2)
nRBC: 0 % (ref 0.0–0.2)

## 2019-08-25 LAB — POCT I-STAT 7, (LYTES, BLD GAS, ICA,H+H)
Acid-Base Excess: 0 mmol/L (ref 0.0–2.0)
Acid-Base Excess: 2 mmol/L (ref 0.0–2.0)
Acid-Base Excess: 1 mmol/L (ref 0.0–2.0)
Acid-Base Excess: 1 mmol/L (ref 0.0–2.0)
Acid-base deficit: 2 mmol/L (ref 0.0–2.0)
Bicarbonate: 25.3 mmol/L (ref 20.0–28.0)
Bicarbonate: 27.1 mmol/L (ref 20.0–28.0)
Bicarbonate: 25.2 mmol/L (ref 20.0–28.0)
Bicarbonate: 25.4 mmol/L (ref 20.0–28.0)
Bicarbonate: 22.8 mmol/L (ref 20.0–28.0)
Calcium, Ion: 1.26 mmol/L (ref 1.15–1.40)
Calcium, Ion: 1.02 mmol/L — ABNORMAL LOW (ref 1.15–1.40)
Calcium, Ion: 1.03 mmol/L — ABNORMAL LOW (ref 1.15–1.40)
Calcium, Ion: 1.05 mmol/L — ABNORMAL LOW (ref 1.15–1.40)
Calcium, Ion: 1.07 mmol/L — ABNORMAL LOW (ref 1.15–1.40)
HCT: 38 % — ABNORMAL LOW (ref 39.0–52.0)
HCT: 27 % — ABNORMAL LOW (ref 39.0–52.0)
HCT: 27 % — ABNORMAL LOW (ref 39.0–52.0)
HCT: 27 % — ABNORMAL LOW (ref 39.0–52.0)
HCT: 30 % — ABNORMAL LOW (ref 39.0–52.0)
Hemoglobin: 12.9 g/dL — ABNORMAL LOW (ref 13.0–17.0)
Hemoglobin: 9.2 g/dL — ABNORMAL LOW (ref 13.0–17.0)
Hemoglobin: 9.2 g/dL — ABNORMAL LOW (ref 13.0–17.0)
Hemoglobin: 9.2 g/dL — ABNORMAL LOW (ref 13.0–17.0)
Hemoglobin: 10.2 g/dL — ABNORMAL LOW (ref 13.0–17.0)
O2 Saturation: 100 %
O2 Saturation: 100 %
O2 Saturation: 100 %
O2 Saturation: 100 %
O2 Saturation: 99 %
Patient temperature: 36
Potassium: 4.1 mmol/L (ref 3.5–5.1)
Potassium: 3.9 mmol/L (ref 3.5–5.1)
Potassium: 5.1 mmol/L (ref 3.5–5.1)
Potassium: 5 mmol/L (ref 3.5–5.1)
Potassium: 4.1 mmol/L (ref 3.5–5.1)
Sodium: 138 mmol/L (ref 135–145)
Sodium: 136 mmol/L (ref 135–145)
Sodium: 136 mmol/L (ref 135–145)
Sodium: 138 mmol/L (ref 135–145)
Sodium: 140 mmol/L (ref 135–145)
TCO2: 27 mmol/L (ref 22–32)
TCO2: 28 mmol/L (ref 22–32)
TCO2: 26 mmol/L (ref 22–32)
TCO2: 27 mmol/L (ref 22–32)
TCO2: 24 mmol/L (ref 22–32)
pCO2 arterial: 42.8 mmHg (ref 32.0–48.0)
pCO2 arterial: 45.2 mmHg (ref 32.0–48.0)
pCO2 arterial: 38.3 mmHg (ref 32.0–48.0)
pCO2 arterial: 38.1 mmHg (ref 32.0–48.0)
pCO2 arterial: 37.6 mmHg (ref 32.0–48.0)
pH, Arterial: 7.38 (ref 7.350–7.450)
pH, Arterial: 7.386 (ref 7.350–7.450)
pH, Arterial: 7.425 (ref 7.350–7.450)
pH, Arterial: 7.431 (ref 7.350–7.450)
pH, Arterial: 7.385 (ref 7.350–7.450)
pO2, Arterial: 182 mmHg — ABNORMAL HIGH (ref 83.0–108.0)
pO2, Arterial: 337 mmHg — ABNORMAL HIGH (ref 83.0–108.0)
pO2, Arterial: 312 mmHg — ABNORMAL HIGH (ref 83.0–108.0)
pO2, Arterial: 265 mmHg — ABNORMAL HIGH (ref 83.0–108.0)
pO2, Arterial: 150 mmHg — ABNORMAL HIGH (ref 83.0–108.0)

## 2019-08-25 LAB — POCT I-STAT, CHEM 8
BUN: 22 mg/dL — ABNORMAL HIGH (ref 6–20)
BUN: 21 mg/dL — ABNORMAL HIGH (ref 6–20)
BUN: 19 mg/dL (ref 6–20)
BUN: 20 mg/dL (ref 6–20)
BUN: 24 mg/dL — ABNORMAL HIGH (ref 6–20)
Calcium, Ion: 1.27 mmol/L (ref 1.15–1.40)
Calcium, Ion: 1.26 mmol/L (ref 1.15–1.40)
Calcium, Ion: 1.03 mmol/L — ABNORMAL LOW (ref 1.15–1.40)
Calcium, Ion: 1.06 mmol/L — ABNORMAL LOW (ref 1.15–1.40)
Calcium, Ion: 1.02 mmol/L — ABNORMAL LOW (ref 1.15–1.40)
Chloride: 102 mmol/L (ref 98–111)
Chloride: 102 mmol/L (ref 98–111)
Chloride: 101 mmol/L (ref 98–111)
Chloride: 102 mmol/L (ref 98–111)
Chloride: 102 mmol/L (ref 98–111)
Creatinine, Ser: 1.3 mg/dL — ABNORMAL HIGH (ref 0.61–1.24)
Creatinine, Ser: 1.3 mg/dL — ABNORMAL HIGH (ref 0.61–1.24)
Creatinine, Ser: 1.2 mg/dL (ref 0.61–1.24)
Creatinine, Ser: 1.1 mg/dL (ref 0.61–1.24)
Creatinine, Ser: 1.2 mg/dL (ref 0.61–1.24)
Glucose, Bld: 95 mg/dL (ref 70–99)
Glucose, Bld: 99 mg/dL (ref 70–99)
Glucose, Bld: 96 mg/dL (ref 70–99)
Glucose, Bld: 129 mg/dL — ABNORMAL HIGH (ref 70–99)
Glucose, Bld: 133 mg/dL — ABNORMAL HIGH (ref 70–99)
HCT: 38 % — ABNORMAL LOW (ref 39.0–52.0)
HCT: 36 % — ABNORMAL LOW (ref 39.0–52.0)
HCT: 26 % — ABNORMAL LOW (ref 39.0–52.0)
HCT: 26 % — ABNORMAL LOW (ref 39.0–52.0)
HCT: 26 % — ABNORMAL LOW (ref 39.0–52.0)
Hemoglobin: 12.9 g/dL — ABNORMAL LOW (ref 13.0–17.0)
Hemoglobin: 12.2 g/dL — ABNORMAL LOW (ref 13.0–17.0)
Hemoglobin: 8.8 g/dL — ABNORMAL LOW (ref 13.0–17.0)
Hemoglobin: 8.8 g/dL — ABNORMAL LOW (ref 13.0–17.0)
Hemoglobin: 8.8 g/dL — ABNORMAL LOW (ref 13.0–17.0)
Potassium: 4.1 mmol/L (ref 3.5–5.1)
Potassium: 4.4 mmol/L (ref 3.5–5.1)
Potassium: 5.3 mmol/L — ABNORMAL HIGH (ref 3.5–5.1)
Potassium: 4.8 mmol/L (ref 3.5–5.1)
Potassium: 4.6 mmol/L (ref 3.5–5.1)
Sodium: 139 mmol/L (ref 135–145)
Sodium: 139 mmol/L (ref 135–145)
Sodium: 137 mmol/L (ref 135–145)
Sodium: 137 mmol/L (ref 135–145)
Sodium: 140 mmol/L (ref 135–145)
TCO2: 27 mmol/L (ref 22–32)
TCO2: 26 mmol/L (ref 22–32)
TCO2: 28 mmol/L (ref 22–32)
TCO2: 27 mmol/L (ref 22–32)
TCO2: 24 mmol/L (ref 22–32)

## 2019-08-25 LAB — GLUCOSE, CAPILLARY
Glucose-Capillary: 109 mg/dL — ABNORMAL HIGH (ref 70–99)
Glucose-Capillary: 105 mg/dL — ABNORMAL HIGH (ref 70–99)
Glucose-Capillary: 122 mg/dL — ABNORMAL HIGH (ref 70–99)
Glucose-Capillary: 126 mg/dL — ABNORMAL HIGH (ref 70–99)
Glucose-Capillary: 116 mg/dL — ABNORMAL HIGH (ref 70–99)
Glucose-Capillary: 82 mg/dL (ref 70–99)
Glucose-Capillary: 112 mg/dL — ABNORMAL HIGH (ref 70–99)
Glucose-Capillary: 96 mg/dL (ref 70–99)
Glucose-Capillary: 106 mg/dL — ABNORMAL HIGH (ref 70–99)

## 2019-08-25 LAB — COMPREHENSIVE METABOLIC PANEL
ALT: 25 U/L (ref 0–44)
AST: 16 U/L (ref 15–41)
Albumin: 4 g/dL (ref 3.5–5.0)
Alkaline Phosphatase: 74 U/L (ref 38–126)
Anion gap: 12 (ref 5–15)
BUN: 24 mg/dL — ABNORMAL HIGH (ref 6–20)
CO2: 25 mmol/L (ref 22–32)
Calcium: 9.6 mg/dL (ref 8.9–10.3)
Chloride: 101 mmol/L (ref 98–111)
Creatinine, Ser: 1.71 mg/dL — ABNORMAL HIGH (ref 0.61–1.24)
GFR calc Af Amer: 50 mL/min — ABNORMAL LOW (ref >60)
GFR calc non Af Amer: 43 mL/min — ABNORMAL LOW (ref >60)
Glucose, Bld: 132 mg/dL — ABNORMAL HIGH (ref 70–99)
Potassium: 4.7 mmol/L (ref 3.5–5.1)
Sodium: 138 mmol/L (ref 135–145)
Total Bilirubin: 1.1 mg/dL (ref 0.3–1.2)
Total Protein: 7.4 g/dL (ref 6.5–8.1)

## 2019-08-25 LAB — CBC WITH DIFFERENTIAL/PLATELET
Abs Immature Granulocytes: 0.05 10*3/uL (ref 0.00–0.07)
Basophils Absolute: 0 10*3/uL (ref 0.0–0.1)
Basophils Relative: 0 %
Eosinophils Absolute: 0.1 10*3/uL (ref 0.0–0.5)
Eosinophils Relative: 1 %
HCT: 47.1 % (ref 39.0–52.0)
Hemoglobin: 14.9 g/dL (ref 13.0–17.0)
Immature Granulocytes: 1 %
Lymphocytes Relative: 20 %
Lymphs Abs: 1.7 10*3/uL (ref 0.7–4.0)
MCH: 27 pg (ref 26.0–34.0)
MCHC: 31.6 g/dL (ref 30.0–36.0)
MCV: 85.3 fL (ref 80.0–100.0)
Monocytes Absolute: 0.7 10*3/uL (ref 0.1–1.0)
Monocytes Relative: 9 %
Neutro Abs: 5.9 10*3/uL (ref 1.7–7.7)
Neutrophils Relative %: 69 %
Platelets: 163 10*3/uL (ref 150–400)
RBC: 5.52 MIL/uL (ref 4.22–5.81)
RDW: 14.7 % (ref 11.5–15.5)
WBC: 8.5 10*3/uL (ref 4.0–10.5)
nRBC: 0 % (ref 0.0–0.2)

## 2019-08-25 LAB — PROTIME-INR
INR: 1 (ref 0.8–1.2)
INR: 1.5 — ABNORMAL HIGH (ref 0.8–1.2)
Prothrombin Time: 13 seconds (ref 11.4–15.2)
Prothrombin Time: 17.3 seconds — ABNORMAL HIGH (ref 11.4–15.2)

## 2019-08-25 LAB — BASIC METABOLIC PANEL
Anion gap: 10 (ref 5–15)
BUN: 20 mg/dL (ref 6–20)
CO2: 19 mmol/L — ABNORMAL LOW (ref 22–32)
Calcium: 7.7 mg/dL — ABNORMAL LOW (ref 8.9–10.3)
Chloride: 109 mmol/L (ref 98–111)
Creatinine, Ser: 1.42 mg/dL — ABNORMAL HIGH (ref 0.61–1.24)
GFR calc Af Amer: >60 mL/min (ref >60)
GFR calc non Af Amer: 54 mL/min — ABNORMAL LOW (ref >60)
Glucose, Bld: 107 mg/dL — ABNORMAL HIGH (ref 70–99)
Potassium: 4.3 mmol/L (ref 3.5–5.1)
Sodium: 138 mmol/L (ref 135–145)

## 2019-08-25 LAB — PLATELET COUNT: Platelets: 96 10*3/uL — ABNORMAL LOW (ref 150–400)

## 2019-08-25 LAB — HEMOGLOBIN AND HEMATOCRIT, BLOOD
HCT: 28.1 % — ABNORMAL LOW (ref 39.0–52.0)
Hemoglobin: 9.1 g/dL — ABNORMAL LOW (ref 13.0–17.0)

## 2019-08-25 LAB — APTT
aPTT: 33 seconds (ref 24–36)
aPTT: 36 seconds (ref 24–36)

## 2019-08-25 LAB — TRIGLYCERIDES: Triglycerides: 78 mg/dL (ref <150)

## 2019-08-25 LAB — MAGNESIUM: Magnesium: 3.6 mg/dL — ABNORMAL HIGH (ref 1.7–2.4)

## 2019-08-25 SURGERY — CORONARY ARTERY BYPASS GRAFTING (CABG)
Anesthesia: General | Site: Chest

## 2019-08-25 MED ORDER — MAGNESIUM SULFATE 4 GM/100ML IV SOLN
4.0000 g | Freq: Once | INTRAVENOUS | Status: AC
  Administered 2019-08-25: 16:00:00 4 g via INTRAVENOUS
  Filled 2019-08-25: qty 100

## 2019-08-25 MED ORDER — NITROGLYCERIN IN D5W 200-5 MCG/ML-% IV SOLN
0.0000 ug/min | INTRAVENOUS | Status: DC
  Administered 2019-08-25: 16:00:00 16.667 ug/min via INTRAVENOUS
  Filled 2019-08-25: qty 250

## 2019-08-25 MED ORDER — FENTANYL CITRATE (PF) 250 MCG/5ML IJ SOLN
INTRAMUSCULAR | Status: AC
  Filled 2019-08-25: qty 25

## 2019-08-25 MED ORDER — MIDAZOLAM HCL 5 MG/5ML IJ SOLN
INTRAMUSCULAR | Status: DC | PRN
  Administered 2019-08-25 (×2): 2 mg via INTRAVENOUS
  Administered 2019-08-25: 3 mg via INTRAVENOUS
  Administered 2019-08-25: 1 mg via INTRAVENOUS
  Administered 2019-08-25: 2 mg via INTRAVENOUS

## 2019-08-25 MED ORDER — CHLORHEXIDINE GLUCONATE 0.12 % MT SOLN
15.0000 mL | OROMUCOSAL | Status: AC
  Administered 2019-08-25: 15 mL via OROMUCOSAL
  Filled 2019-08-25: qty 15

## 2019-08-25 MED ORDER — SODIUM CHLORIDE 0.9% FLUSH
3.0000 mL | Freq: Two times a day (BID) | INTRAVENOUS | Status: DC
  Administered 2019-08-26 – 2019-08-29 (×4): 3 mL via INTRAVENOUS

## 2019-08-25 MED ORDER — ROCURONIUM BROMIDE 10 MG/ML (PF) SYRINGE
PREFILLED_SYRINGE | INTRAVENOUS | Status: DC | PRN
  Administered 2019-08-25: 10 mg via INTRAVENOUS
  Administered 2019-08-25: 90 mg via INTRAVENOUS
  Administered 2019-08-25 (×3): 50 mg via INTRAVENOUS

## 2019-08-25 MED ORDER — POTASSIUM CHLORIDE 10 MEQ/50ML IV SOLN: 10.0000 meq | INTRAVENOUS | Status: AC

## 2019-08-25 MED ORDER — BISACODYL 5 MG PO TBEC
10.0000 mg | DELAYED_RELEASE_TABLET | Freq: Every day | ORAL | Status: DC
  Administered 2019-08-26: 10 mg via ORAL
  Administered 2019-08-27: 5 mg via ORAL
  Administered 2019-08-28 – 2019-08-30 (×2): 10 mg via ORAL
  Filled 2019-08-25 (×4): qty 2

## 2019-08-25 MED ORDER — ONDANSETRON HCL 4 MG/2ML IJ SOLN
4.0000 mg | Freq: Four times a day (QID) | INTRAMUSCULAR | Status: DC | PRN
  Administered 2019-08-26 – 2019-08-27 (×3): 4 mg via INTRAVENOUS
  Filled 2019-08-25 (×3): qty 2

## 2019-08-25 MED ORDER — HEPARIN SODIUM (PORCINE) 1000 UNIT/ML IJ SOLN
INTRAMUSCULAR | Status: DC | PRN
  Administered 2019-08-25: 29000 [IU] via INTRAVENOUS

## 2019-08-25 MED ORDER — FENTANYL CITRATE (PF) 250 MCG/5ML IJ SOLN
INTRAMUSCULAR | Status: DC | PRN
  Administered 2019-08-25: 50 ug via INTRAVENOUS
  Administered 2019-08-25: 150 ug via INTRAVENOUS
  Administered 2019-08-25: 600 ug via INTRAVENOUS
  Administered 2019-08-25: 50 ug via INTRAVENOUS
  Administered 2019-08-25 (×2): 100 ug via INTRAVENOUS

## 2019-08-25 MED ORDER — ASPIRIN 81 MG PO CHEW: 324.0000 mg | CHEWABLE_TABLET | Freq: Every day | ORAL | Status: DC

## 2019-08-25 MED ORDER — SODIUM CHLORIDE 0.9 % IV SOLN: 250.0000 mL | INTRAVENOUS | Status: DC

## 2019-08-25 MED ORDER — PROPOFOL 1000 MG/100ML IV EMUL: 5.0000 ug/kg/min | INTRAVENOUS | Status: DC

## 2019-08-25 MED ORDER — PROTAMINE SULFATE 10 MG/ML IV SOLN
INTRAVENOUS | Status: DC | PRN
  Administered 2019-08-25: 20 mg via INTRAVENOUS
  Administered 2019-08-25: 240 mg via INTRAVENOUS

## 2019-08-25 MED ORDER — DOCUSATE SODIUM 100 MG PO CAPS
200.0000 mg | ORAL_CAPSULE | Freq: Every day | ORAL | Status: DC
  Administered 2019-08-26 – 2019-08-30 (×4): 200 mg via ORAL
  Filled 2019-08-25 (×3): qty 2

## 2019-08-25 MED ORDER — ACETAMINOPHEN 500 MG PO TABS
1000.0000 mg | ORAL_TABLET | Freq: Four times a day (QID) | ORAL | Status: AC
  Administered 2019-08-26 – 2019-08-30 (×9): 1000 mg via ORAL
  Filled 2019-08-25 (×10): qty 2

## 2019-08-25 MED ORDER — SUCCINYLCHOLINE CHLORIDE 200 MG/10ML IV SOSY
PREFILLED_SYRINGE | INTRAVENOUS | Status: DC | PRN
Start: 2019-08-25 — End: 2019-08-25
  Administered 2019-08-25: 120 mg via INTRAVENOUS

## 2019-08-25 MED ORDER — SODIUM CHLORIDE 0.9 % IV SOLN: INTRAVENOUS | Status: DC

## 2019-08-25 MED ORDER — METHYLPREDNISOLONE SODIUM SUCC 125 MG IJ SOLR
INTRAMUSCULAR | Status: DC | PRN
  Administered 2019-08-25: 125 mg via INTRAVENOUS

## 2019-08-25 MED ORDER — ASPIRIN EC 325 MG PO TBEC
325.0000 mg | DELAYED_RELEASE_TABLET | Freq: Every day | ORAL | Status: DC
  Administered 2019-08-26: 325 mg via ORAL
  Filled 2019-08-25: qty 1

## 2019-08-25 MED ORDER — BUPIVACAINE LIPOSOME 1.3 % IJ SUSP
20.0000 mL | Freq: Once | INTRAMUSCULAR | Status: DC
  Filled 2019-08-25: qty 20

## 2019-08-25 MED ORDER — BUPIVACAINE LIPOSOME 1.3 % IJ SUSP
INTRAMUSCULAR | Status: DC | PRN
  Administered 2019-08-25: 20 mL

## 2019-08-25 MED ORDER — DEXMEDETOMIDINE HCL IN NACL 400 MCG/100ML IV SOLN
0.0000 ug/kg/h | INTRAVENOUS | Status: DC
  Administered 2019-08-25 (×2): 0.7 ug/kg/h via INTRAVENOUS
  Filled 2019-08-25: qty 100

## 2019-08-25 MED ORDER — SODIUM CHLORIDE 0.45 % IV SOLN: INTRAVENOUS | Status: DC | PRN

## 2019-08-25 MED ORDER — BISACODYL 10 MG RE SUPP: 10.0000 mg | Freq: Every day | RECTAL | Status: DC

## 2019-08-25 MED ORDER — TRAMADOL HCL 50 MG PO TABS
50.0000 mg | ORAL_TABLET | ORAL | Status: DC | PRN
  Administered 2019-08-26 – 2019-08-28 (×6): 50 mg via ORAL
  Filled 2019-08-25 (×6): qty 1

## 2019-08-25 MED ORDER — METOPROLOL TARTRATE 12.5 MG HALF TABLET
12.5000 mg | ORAL_TABLET | Freq: Two times a day (BID) | ORAL | Status: DC
  Administered 2019-08-26 – 2019-08-27 (×3): 12.5 mg via ORAL
  Filled 2019-08-25 (×3): qty 1

## 2019-08-25 MED ORDER — STERILE WATER FOR INJECTION IJ SOLN
INTRAMUSCULAR | Status: AC
  Filled 2019-08-25: qty 10

## 2019-08-25 MED ORDER — NON FORMULARY
Status: DC | PRN
  Administered 2019-08-25: 11:00:00 .5 mL

## 2019-08-25 MED ORDER — PROPOFOL 10 MG/ML IV BOLUS
INTRAVENOUS | Status: DC | PRN
  Administered 2019-08-25: 30 mg via INTRAVENOUS

## 2019-08-25 MED ORDER — SODIUM CHLORIDE 0.9% FLUSH
3.0000 mL | INTRAVENOUS | Status: DC | PRN
  Administered 2019-08-27: 06:00:00 3 mL via INTRAVENOUS

## 2019-08-25 MED ORDER — NITROGLYCERIN IN D5W 200-5 MCG/ML-% IV SOLN
INTRAVENOUS | Status: DC | PRN
  Administered 2019-08-25: 16.6 ug/min via INTRAVENOUS

## 2019-08-25 MED ORDER — HEMOSTATIC AGENTS (NO CHARGE) OPTIME
TOPICAL | Status: DC | PRN
  Administered 2019-08-25 (×3): 1 via TOPICAL

## 2019-08-25 MED ORDER — MIDAZOLAM HCL (PF) 10 MG/2ML IJ SOLN
INTRAMUSCULAR | Status: AC
  Filled 2019-08-25: qty 2

## 2019-08-25 MED ORDER — ALBUMIN HUMAN 5 % IV SOLN
250.0000 mL | INTRAVENOUS | Status: AC | PRN
  Administered 2019-08-25 (×3): 12.5 g via INTRAVENOUS
  Filled 2019-08-25 (×2): qty 250

## 2019-08-25 MED ORDER — ALBUMIN HUMAN 5 % IV SOLN: INTRAVENOUS | Status: DC | PRN

## 2019-08-25 MED ORDER — ARTIFICIAL TEARS OPHTHALMIC OINT
TOPICAL_OINTMENT | OPHTHALMIC | Status: DC | PRN
  Administered 2019-08-25: 1 via OPHTHALMIC

## 2019-08-25 MED ORDER — NICARDIPINE HCL IN NACL 20-0.86 MG/200ML-% IV SOLN
3.0000 mg/h | INTRAVENOUS | Status: DC
  Administered 2019-08-26: 5 mg/h via INTRAVENOUS
  Administered 2019-08-27: 04:00:00 15 mg/h via INTRAVENOUS
  Administered 2019-08-27: 06:00:00 7.5 mg/h via INTRAVENOUS
  Administered 2019-08-27: 5 mg/h via INTRAVENOUS
  Administered 2019-08-27: 15 mg/h via INTRAVENOUS
  Filled 2019-08-25 (×6): qty 200

## 2019-08-25 MED ORDER — VANCOMYCIN HCL 1000 MG IV SOLR
INTRAVENOUS | Status: DC | PRN
  Administered 2019-08-25: 1500 mg via INTRAVENOUS

## 2019-08-25 MED ORDER — PHENYLEPHRINE 40 MCG/ML (10ML) SYRINGE FOR IV PUSH (FOR BLOOD PRESSURE SUPPORT)
PREFILLED_SYRINGE | INTRAVENOUS | Status: DC | PRN
  Administered 2019-08-25: 80 ug via INTRAVENOUS
  Administered 2019-08-25 (×2): 40 ug via INTRAVENOUS
  Administered 2019-08-25: 80 ug via INTRAVENOUS
  Administered 2019-08-25: 40 ug via INTRAVENOUS

## 2019-08-25 MED ORDER — ACETAMINOPHEN 650 MG RE SUPP
650.0000 mg | Freq: Once | RECTAL | Status: AC
  Administered 2019-08-25: 16:00:00 650 mg via RECTAL

## 2019-08-25 MED ORDER — PANTOPRAZOLE SODIUM 40 MG PO TBEC
40.0000 mg | DELAYED_RELEASE_TABLET | Freq: Every day | ORAL | Status: DC
  Administered 2019-08-27 – 2019-08-31 (×5): 40 mg via ORAL
  Filled 2019-08-25 (×5): qty 1

## 2019-08-25 MED ORDER — METOPROLOL TARTRATE 5 MG/5ML IV SOLN
2.5000 mg | INTRAVENOUS | Status: DC | PRN
  Administered 2019-08-26 – 2019-08-27 (×6): 5 mg via INTRAVENOUS
  Filled 2019-08-25 (×6): qty 5

## 2019-08-25 MED ORDER — VANCOMYCIN HCL 1000 MG IV SOLR
INTRAVENOUS | Status: AC
  Filled 2019-08-25: qty 3000

## 2019-08-25 MED ORDER — LACTATED RINGERS IV SOLN: 500.0000 mL | Freq: Once | INTRAVENOUS | Status: DC | PRN

## 2019-08-25 MED ORDER — 0.9 % SODIUM CHLORIDE (POUR BTL) OPTIME
TOPICAL | Status: DC | PRN
  Administered 2019-08-25: 5000 mL

## 2019-08-25 MED ORDER — ACETAMINOPHEN 160 MG/5ML PO SOLN: 650.0000 mg | Freq: Once | ORAL | Status: AC

## 2019-08-25 MED ORDER — PROPOFOL 10 MG/ML IV BOLUS
INTRAVENOUS | Status: AC
  Filled 2019-08-25: qty 20

## 2019-08-25 MED ORDER — MORPHINE SULFATE (PF) 2 MG/ML IV SOLN
1.0000 mg | INTRAVENOUS | Status: DC | PRN
  Administered 2019-08-25: 2 mg via INTRAVENOUS
  Administered 2019-08-26 (×2): 4 mg via INTRAVENOUS
  Administered 2019-08-26 (×2): 2 mg via INTRAVENOUS
  Administered 2019-08-26: 4 mg via INTRAVENOUS
  Administered 2019-08-26 (×2): 2 mg via INTRAVENOUS
  Administered 2019-08-26: 4 mg via INTRAVENOUS
  Administered 2019-08-27 (×5): 2 mg via INTRAVENOUS
  Administered 2019-08-27: 4 mg via INTRAVENOUS
  Filled 2019-08-25 (×2): qty 1
  Filled 2019-08-25: qty 2
  Filled 2019-08-25 (×2): qty 1
  Filled 2019-08-25: qty 2
  Filled 2019-08-25: qty 1
  Filled 2019-08-25: qty 2
  Filled 2019-08-25: qty 1
  Filled 2019-08-25: qty 2
  Filled 2019-08-25 (×3): qty 1
  Filled 2019-08-25: qty 2
  Filled 2019-08-25: qty 1

## 2019-08-25 MED ORDER — DEXTROSE 50 % IV SOLN: 0.0000 mL | INTRAVENOUS | Status: DC | PRN

## 2019-08-25 MED ORDER — LIDOCAINE 2% (20 MG/ML) 5 ML SYRINGE
INTRAMUSCULAR | Status: DC | PRN
  Administered 2019-08-25: 100 mg via INTRAVENOUS

## 2019-08-25 MED ORDER — LACTATED RINGERS IV SOLN: INTRAVENOUS | Status: DC

## 2019-08-25 MED ORDER — INSULIN REGULAR(HUMAN) IN NACL 100-0.9 UT/100ML-% IV SOLN
INTRAVENOUS | Status: DC | PRN
  Administered 2019-08-25: 1 [IU]/h via INTRAVENOUS

## 2019-08-25 MED ORDER — ACETAMINOPHEN 160 MG/5ML PO SOLN
1000.0000 mg | Freq: Four times a day (QID) | ORAL | Status: AC
  Administered 2019-08-27 – 2019-08-28 (×4): 1000 mg
  Filled 2019-08-25 (×4): qty 40.6

## 2019-08-25 MED ORDER — LACTATED RINGERS IV SOLN: INTRAVENOUS | Status: DC | PRN

## 2019-08-25 MED ORDER — SODIUM CHLORIDE (PF) 0.9 % IJ SOLN
OROMUCOSAL | Status: DC | PRN
  Administered 2019-08-25 (×2): 4 mL via TOPICAL

## 2019-08-25 MED ORDER — FAMOTIDINE IN NACL 20-0.9 MG/50ML-% IV SOLN
20.0000 mg | Freq: Two times a day (BID) | INTRAVENOUS | Status: AC
  Administered 2019-08-25 (×2): 20 mg via INTRAVENOUS
  Filled 2019-08-25: qty 50

## 2019-08-25 MED ORDER — PLASMA-LYTE 148 IV SOLN
INTRAVENOUS | Status: DC | PRN
  Administered 2019-08-25: 500 mL via INTRAVASCULAR

## 2019-08-25 MED ORDER — INSULIN REGULAR(HUMAN) IN NACL 100-0.9 UT/100ML-% IV SOLN
INTRAVENOUS | Status: DC
  Administered 2019-08-25: 1.1 [IU]/h via INTRAVENOUS
  Administered 2019-08-27: 2.8 [IU]/h via INTRAVENOUS

## 2019-08-25 MED ORDER — CHLORHEXIDINE GLUCONATE CLOTH 2 % EX PADS
6.0000 | MEDICATED_PAD | Freq: Every day | CUTANEOUS | Status: DC
  Administered 2019-08-25 – 2019-08-29 (×5): 6 via TOPICAL

## 2019-08-25 MED ORDER — TRANEXAMIC ACID 1000 MG/10ML IV SOLN
INTRAVENOUS | Status: DC | PRN
  Administered 2019-08-25: 1.5 mg/kg/h via INTRAVENOUS

## 2019-08-25 MED ORDER — DEXMEDETOMIDINE HCL IN NACL 400 MCG/100ML IV SOLN
INTRAVENOUS | Status: DC | PRN
  Administered 2019-08-25: .7 ug/kg/h via INTRAVENOUS

## 2019-08-25 MED ORDER — MIDAZOLAM HCL 2 MG/2ML IJ SOLN
2.0000 mg | INTRAMUSCULAR | Status: DC | PRN
  Filled 2019-08-25: qty 2

## 2019-08-25 MED ORDER — PROTAMINE SULFATE 10 MG/ML IV SOLN: INTRAVENOUS | Status: DC | PRN

## 2019-08-25 MED ORDER — METOPROLOL TARTRATE 25 MG/10 ML ORAL SUSPENSION
12.5000 mg | Freq: Two times a day (BID) | ORAL | Status: DC
  Administered 2019-08-27 – 2019-08-28 (×2): 12.5 mg
  Filled 2019-08-25 (×3): qty 5

## 2019-08-25 MED ORDER — BUPIVACAINE-EPINEPHRINE (PF) 0.5% -1:200000 IJ SOLN
INTRAMUSCULAR | Status: DC | PRN
  Administered 2019-08-25: 30 mL

## 2019-08-25 MED ORDER — PHENYLEPHRINE HCL-NACL 20-0.9 MG/250ML-% IV SOLN
0.0000 ug/min | INTRAVENOUS | Status: DC
  Administered 2019-08-25: 5 ug/min via INTRAVENOUS

## 2019-08-25 MED ORDER — VANCOMYCIN HCL 1000 MG IV SOLR
INTRAVENOUS | Status: DC | PRN
  Administered 2019-08-25 (×3): 1000 mg

## 2019-08-25 MED ORDER — SODIUM CHLORIDE 0.9 % IV SOLN
1.5000 g | Freq: Two times a day (BID) | INTRAVENOUS | Status: AC
  Administered 2019-08-25 – 2019-08-27 (×4): 1.5 g via INTRAVENOUS
  Filled 2019-08-25 (×5): qty 1.5

## 2019-08-25 MED ORDER — PHENYLEPHRINE HCL-NACL 20-0.9 MG/250ML-% IV SOLN
INTRAVENOUS | Status: DC | PRN
  Administered 2019-08-25: 25 ug/min via INTRAVENOUS

## 2019-08-25 MED ORDER — OMEGA-3-ACID ETHYL ESTERS 1 G PO CAPS
1000.0000 mg | ORAL_CAPSULE | Freq: Two times a day (BID) | ORAL | Status: DC
  Administered 2019-08-27 – 2019-08-28 (×3): 1000 mg via ORAL
  Filled 2019-08-25 (×6): qty 1

## 2019-08-25 MED ORDER — BUPIVACAINE HCL (PF) 0.5 % IJ SOLN
INTRAMUSCULAR | Status: AC
  Filled 2019-08-25: qty 30

## 2019-08-25 MED ORDER — VANCOMYCIN HCL IN DEXTROSE 1-5 GM/200ML-% IV SOLN: 1000.0000 mg | Freq: Once | INTRAVENOUS | Status: DC

## 2019-08-25 MED FILL — Potassium Chloride Inj 2 mEq/ML: INTRAVENOUS | Qty: 40 | Status: AC

## 2019-08-25 MED FILL — Magnesium Sulfate Inj 50%: INTRAMUSCULAR | Qty: 10 | Status: AC

## 2019-08-25 MED FILL — Heparin Sodium (Porcine) Inj 1000 Unit/ML: INTRAMUSCULAR | Qty: 30 | Status: AC

## 2019-08-25 SURGICAL SUPPLY — 107 items
ADAPTER CARDIO PERF ANTE/RETRO (ADAPTER) ×4 IMPLANT
APPLIER CLIP 9.375 SM OPEN (CLIP) ×4
BAG DECANTER FOR FLEXI CONT (MISCELLANEOUS) ×4 IMPLANT
BASKET HEART (ORDER IN 25'S) (MISCELLANEOUS) ×1
BASKET HEART (ORDER IN 25S) (MISCELLANEOUS) ×3 IMPLANT
BLADE CLIPPER SURG (BLADE) ×4 IMPLANT
BLADE STERNUM SYSTEM 6 (BLADE) ×4 IMPLANT
BLADE SURG 15 STRL LF DISP TIS (BLADE) ×3 IMPLANT
BLADE SURG 15 STRL SS (BLADE) ×1
BNDG ELASTIC 4X5.8 VLCR STR LF (GAUZE/BANDAGES/DRESSINGS) ×4 IMPLANT
BNDG ELASTIC 6X5.8 VLCR STR LF (GAUZE/BANDAGES/DRESSINGS) ×4 IMPLANT
BNDG GAUZE ELAST 4 BULKY (GAUZE/BANDAGES/DRESSINGS) ×4 IMPLANT
CANISTER SUCT 3000ML PPV (MISCELLANEOUS) ×4 IMPLANT
CANNULA NON VENT 20FR 12 (CANNULA) ×4 IMPLANT
CATH CPB KIT HENDRICKSON (MISCELLANEOUS) ×4 IMPLANT
CLIP APPLIE 9.375 SM OPEN (CLIP) ×3 IMPLANT
CLIP RETRACTION 3.0MM CORONARY (MISCELLANEOUS) ×4 IMPLANT
CLIP VESOCCLUDE MED 24/CT (CLIP) IMPLANT
CLIP VESOCCLUDE SM WIDE 24/CT (CLIP) IMPLANT
CONN ST 1/4X3/8  BEN (MISCELLANEOUS) ×3
CONN ST 1/4X3/8 BEN (MISCELLANEOUS) ×9 IMPLANT
COVER MAYO STAND STRL (DRAPES) ×4 IMPLANT
CUFF TOURN SGL QUICK 18X4 (TOURNIQUET CUFF) IMPLANT
CUFF TOURN SGL QUICK 24 (TOURNIQUET CUFF)
CUFF TRNQT CYL 24X4X16.5-23 (TOURNIQUET CUFF) IMPLANT
DERMABOND ADVANCED (GAUZE/BANDAGES/DRESSINGS) ×1
DERMABOND ADVANCED .7 DNX12 (GAUZE/BANDAGES/DRESSINGS) ×3 IMPLANT
DRAIN CHANNEL 28F RND 3/8 FF (WOUND CARE) ×28 IMPLANT
DRAPE CARDIOVASCULAR INCISE (DRAPES) ×1
DRAPE EXTREMITY T 121X128X90 (DISPOSABLE) ×4 IMPLANT
DRAPE HALF SHEET 40X57 (DRAPES) ×4 IMPLANT
DRAPE SLUSH/WARMER DISC (DRAPES) ×4 IMPLANT
DRAPE SRG 135X102X78XABS (DRAPES) ×3 IMPLANT
DRSG AQUACEL AG ADV 3.5X14 (GAUZE/BANDAGES/DRESSINGS) ×4 IMPLANT
ELECT CAUTERY BLADE 6.4 (BLADE) ×4 IMPLANT
ELECT REM PT RETURN 9FT ADLT (ELECTROSURGICAL) ×8
ELECTRODE REM PT RTRN 9FT ADLT (ELECTROSURGICAL) ×6 IMPLANT
FELT TEFLON 1X6 (MISCELLANEOUS) ×4 IMPLANT
GAUZE SPONGE 4X4 12PLY STRL (GAUZE/BANDAGES/DRESSINGS) ×4 IMPLANT
GEL ULTRASOUND 20GR AQUASONIC (MISCELLANEOUS) ×4 IMPLANT
GLOVE BIOGEL PI IND STRL 6.5 (GLOVE) ×12 IMPLANT
GLOVE BIOGEL PI INDICATOR 6.5 (GLOVE) ×4
GLOVE NEODERM STRL 7.5 LF PF (GLOVE) ×12 IMPLANT
GLOVE SURG NEODERM 7.5  LF PF (GLOVE) ×4
GLOVE SURG SS PI 6.5 STRL IVOR (GLOVE) ×4 IMPLANT
GOWN STRL REUS W/ TWL LRG LVL3 (GOWN DISPOSABLE) ×27 IMPLANT
GOWN STRL REUS W/TWL LRG LVL3 (GOWN DISPOSABLE) ×9
HEMOSTAT POWDER SURGIFOAM 1G (HEMOSTASIS) ×8 IMPLANT
INSERT FOGARTY XLG (MISCELLANEOUS) ×4 IMPLANT
KIT BASIN OR (CUSTOM PROCEDURE TRAY) ×4 IMPLANT
KIT SUCTION CATH 14FR (SUCTIONS) ×4 IMPLANT
KIT TURNOVER KIT B (KITS) ×4 IMPLANT
KIT VASOVIEW HEMOPRO 2 VH 4000 (KITS) ×4 IMPLANT
MARKER GRAFT CORONARY BYPASS (MISCELLANEOUS) ×4 IMPLANT
MARKER SKIN DUAL TIP RULER LAB (MISCELLANEOUS) ×4 IMPLANT
NEEDLE 18GX1X1/2 (RX/OR ONLY) (NEEDLE) ×4 IMPLANT
NS IRRIG 1000ML POUR BTL (IV SOLUTION) ×20 IMPLANT
PACK E OPEN HEART (SUTURE) ×4 IMPLANT
PACK OPEN HEART (CUSTOM PROCEDURE TRAY) ×4 IMPLANT
PACK SPY-PHI (KITS) ×4 IMPLANT
PAD ARMBOARD 7.5X6 YLW CONV (MISCELLANEOUS) ×8 IMPLANT
PAD ELECT DEFIB RADIOL ZOLL (MISCELLANEOUS) ×4 IMPLANT
PENCIL BUTTON HOLSTER BLD 10FT (ELECTRODE) ×4 IMPLANT
POSITIONER HEAD DONUT 9IN (MISCELLANEOUS) ×4 IMPLANT
POWDER SURGICEL 3.0 GRAM (HEMOSTASIS) ×4 IMPLANT
SEALANT PATCH FIBRIN 2X4IN (MISCELLANEOUS) ×4 IMPLANT
SEALANT SURG COSEAL 8ML (VASCULAR PRODUCTS) ×4 IMPLANT
SET CANNULATION TOURNIQUET (MISCELLANEOUS) ×4 IMPLANT
SET CARDIOPLEGIA MPS 5001102 (MISCELLANEOUS) ×4 IMPLANT
SHEARS HARMONIC 9CM CVD (BLADE) ×4 IMPLANT
SPONGE LAP 18X18 RF (DISPOSABLE) ×4 IMPLANT
SUT BONE WAX W31G (SUTURE) ×4 IMPLANT
SUT MNCRL AB 3-0 PS2 18 (SUTURE) ×8 IMPLANT
SUT MNCRL AB 4-0 PS2 18 (SUTURE) ×8 IMPLANT
SUT PDS AB 1 CTX 36 (SUTURE) ×8 IMPLANT
SUT PROLENE 3 0 SH DA (SUTURE) ×8 IMPLANT
SUT PROLENE 4 0 SH DA (SUTURE) ×4 IMPLANT
SUT PROLENE 5 0 C 1 36 (SUTURE) ×4 IMPLANT
SUT PROLENE 6 0 C 1 30 (SUTURE) ×12 IMPLANT
SUT PROLENE 7 0 BV 1 (SUTURE) ×12 IMPLANT
SUT PROLENE 8 0 BV175 6 (SUTURE) ×16 IMPLANT
SUT PROLENE BLUE 7 0 (SUTURE) ×4 IMPLANT
SUT SILK  1 MH (SUTURE) ×2
SUT SILK 1 MH (SUTURE) ×6 IMPLANT
SUT SILK 2 0 SH CR/8 (SUTURE) IMPLANT
SUT SILK 3 0 SH CR/8 (SUTURE) IMPLANT
SUT STEEL 6MS V (SUTURE) ×4 IMPLANT
SUT STEEL SZ 6 DBL 3X14 BALL (SUTURE) ×4 IMPLANT
SUT VIC AB 2-0 CT1 27 (SUTURE)
SUT VIC AB 2-0 CT1 TAPERPNT 27 (SUTURE) IMPLANT
SUT VIC AB 2-0 CTX 27 (SUTURE) IMPLANT
SUT VIC AB 3-0 SH 27 (SUTURE) ×2
SUT VIC AB 3-0 SH 27X BRD (SUTURE) ×6 IMPLANT
SUT VIC AB 3-0 X1 27 (SUTURE) IMPLANT
SYR 10ML LL (SYRINGE) ×4 IMPLANT
SYR 30ML LL (SYRINGE) ×4 IMPLANT
SYR 3ML LL SCALE MARK (SYRINGE) ×4 IMPLANT
SYR 50ML SLIP (SYRINGE) IMPLANT
SYSTEM SAHARA CHEST DRAIN ATS (WOUND CARE) ×4 IMPLANT
TAPE CLOTH SOFT 2X10 (GAUZE/BANDAGES/DRESSINGS) ×4 IMPLANT
TAPE PAPER 2X10 WHT MICROPORE (GAUZE/BANDAGES/DRESSINGS) ×4 IMPLANT
TOWEL GREEN STERILE (TOWEL DISPOSABLE) ×4 IMPLANT
TOWEL GREEN STERILE FF (TOWEL DISPOSABLE) ×4 IMPLANT
TUBING LAP HI FLOW INSUFFLATIO (TUBING) ×4 IMPLANT
UNDERPAD 30X30 (UNDERPADS AND DIAPERS) ×4 IMPLANT
WATER STERILE IRR 1000ML POUR (IV SOLUTION) ×8 IMPLANT
WATER STERILE IRR 1000ML UROMA (IV SOLUTION) IMPLANT

## 2019-08-25 NOTE — Progress Notes (Signed)
Second wean attempted at 2025. Patient tachypneic in the 40's and very hypertensive even with titration of medications. Dr. Orvan Seen notified on evening rounds and at bedside. New orders given (See MAR). Patient put back on full support again and stable at this time. Will continue to monitor.

## 2019-08-25 NOTE — Brief Op Note (Signed)
08/20/2019 - 08/25/2019  1:22 PM  PATIENT:  John Dodson  59 y.o. male  PRE-OPERATIVE DIAGNOSIS:  CORONARY ARTERY DISEASE  POST-OPERATIVE DIAGNOSIS:  CORONARY ARTERY DISEASE  PROCEDURE:  TRANSESOPHAGEAL ECHOCARDIOGRAM (TEE), CORONARY ARTERY BYPASS GRAFTING (CABG) TIMES FOUR (LIMA to LAD, LEFT RADIAL ARTERY SEQUENTIALLY to OM 2 and PDA, RIMA to RAMUS) USING LEFT AND RIGHT INTERNAL MAMMARY ARTERY AND LEFT RADIAL ARTERY and LEFT RADIAL ARTERY HARVEST, and INDOCYANINE GREEN FLUORESCENCE IMAGING (ICG)   SURGEON:  Surgeon(s) and Role:    [name], MD - Primary  PHYSICIAN ASSISTANT: Lars Pinks PA-C  ASSISTANTS: Magda Kiel RNFA  ANESTHESIA:   general  EBL:  Per anesthesia and perfusion record  DRAINS: Chest tubes placed in the mediastinal and pleural spaces   COUNTS CORRECT:  YES  DICTATION: .Dragon Dictation  PLAN OF CARE: Admit to inpatient   PATIENT DISPOSITION:  ICU - intubated and hemodynamically stable.   Delay start of Pharmacological VTE agent (>24hrs) due to surgical blood loss or risk of bleeding: yes  BASELINE WEIGHT: 94.4 kg

## 2019-08-25 NOTE — Transfer of Care (Signed)
Immediate Anesthesia Transfer of Care Note  Patient: John Dodson  Procedure(s) Performed: CORONARY ARTERY BYPASS GRAFTING (CABG) TIMES FOUR, ON PUMP, USING LEFT AND RIGHT INTERNAL MAMMARY ARTERY AND LEFT RADIAL ARTERY (N/A Chest) RADIAL ARTERY HARVEST (Left Arm Lower) TRANSESOPHAGEAL ECHOCARDIOGRAM (TEE) (N/A ) INDOCYANINE GREEN FLUORESCENCE IMAGING (ICG) (N/A )  Patient Location: ICU  Anesthesia Type:General  Level of Consciousness: Patient remains intubated per anesthesia plan  Airway & Oxygen Therapy: Patient remains intubated per anesthesia plan and Patient placed on Ventilator (see vital sign flow sheet for setting)  Post-op Assessment: Report given to RN and Post -op Vital signs reviewed and stable  Post vital signs: Reviewed and stable  Last Vitals:  Vitals Value Taken Time  BP 103/61 08/25/19 1452  Temp    Pulse 90 08/25/19 1459  Resp 0 08/25/19 1459  SpO2 100 % 08/25/19 1459  Vitals shown include unvalidated device data.  Last Pain:  Vitals:   08/25/19 0440  TempSrc: Oral  PainSc:       Patients Stated Pain Goal: 0 ([id number])  Complications: No apparent anesthesia complications

## 2019-08-25 NOTE — Anesthesia Procedure Notes (Signed)
Central Venous Catheter Insertion Performed by: Suzette Battiest, MD, anesthesiologist Start/End4/28/2021 6:50 AM, 08/25/2019 7:10 AM Patient location: Pre-op. Preanesthetic checklist: patient identified, IV checked, site marked, risks and benefits discussed, surgical consent, monitors and equipment checked, pre-op evaluation, timeout performed and anesthesia consent Position: Trendelenburg Lidocaine 1% used for infiltration and patient sedated Hand hygiene performed , maximum sterile barriers used  and Seldinger technique used Catheter size: 9 Fr Total catheter length 10. Central line and PA cath was placed.MAC introducer Swan type:thermodilution PA Cath depth:50 Procedure performed using ultrasound guided technique. Ultrasound Notes:anatomy identified, needle tip was noted to be adjacent to the nerve/plexus identified, no ultrasound evidence of intravascular and/or intraneural injection and image(s) printed for medical record Attempts: 2 Following insertion, line sutured, dressing applied and Biopatch. Post procedure assessment: blood return through all ports, free fluid flow and no air  Patient tolerated the procedure well with no immediate complications.

## 2019-08-25 NOTE — Progress Notes (Signed)
Trying the second step in weaning again CP/PS 10/5 and 40 %.

## 2019-08-25 NOTE — Progress Notes (Signed)
Placed back on regular vent settings patient is not awake enough to continue wean at this time.

## 2019-08-25 NOTE — Progress Notes (Signed)
Patient ID: John Dodson, male   DOB: 1960-05-10, 59 y.o.   MRN: [id number]  TCTS Evening Rounds:   Hemodynamically stable  CI = 1.6  Asleep on vent  Urine output good  CT output low  CBC    Component Value Date/Time   WBC 14.2 (H) 08/25/2019 1508   RBC 3.70 (L) 08/25/2019 1508   HGB 10.2 (L) 08/25/2019 1509   HGB 14.0 08/17/2019 1038   HCT 30.0 (L) 08/25/2019 1509   HCT 43.6 08/17/2019 1038   PLT 148 (L) 08/25/2019 1508   PLT 149 (L) 08/17/2019 1038   MCV 86.2 08/25/2019 1508   MCV 85 08/17/2019 1038   MCH 27.8 08/25/2019 1508   MCHC 32.3 08/25/2019 1508   RDW 14.6 08/25/2019 1508   RDW 14.7 08/17/2019 1038   LYMPHSABS 1.7 08/25/2019 0442   MONOABS 0.7 08/25/2019 0442   EOSABS 0.1 08/25/2019 0442   BASOSABS 0.0 08/25/2019 0442     BMET    Component Value Date/Time   NA 140 08/25/2019 1509   NA 140 08/17/2019 1038   K 4.1 08/25/2019 1509   CL 102 08/25/2019 1344   CO2 25 08/25/2019 0442   GLUCOSE 133 (H) 08/25/2019 1344   BUN 24 (H) 08/25/2019 1344   BUN 23 08/17/2019 1038   CREATININE 1.20 08/25/2019 1344   CALCIUM 9.6 08/25/2019 0442   GFRNONAA 43 (L) 08/25/2019 0442   GFRAA 50 (L) 08/25/2019 0442     A/P:  Stable postop course. Continue current plans

## 2019-08-25 NOTE — Procedures (Signed)
Extubation Procedure Note  Patient Details:   Name: John Dodson DOB: 23-Jun-1960 MRN: [id number]   Airway Documentation:  Airway 8 mm (Active)  Secured at (cm) 23 cm 08/25/19 1955  Measured From Lips 08/25/19 1955  Secured Location Right 08/25/19 1955  Secured By Pink Tape 08/25/19 1955  Site Condition Dry 08/25/19 1955   Vent end date: 08/25/19 Vent end time: 2331   Evaluation  O2 sats: stable throughout Complications: No apparent complications Patient did tolerate procedure well. Bilateral Breath Sounds: Clear, Diminished   Yes  Pt passed all weaning parameters. NIF -25 VC 850 positive cuff leak present.  Patient is able to verbalize his name.  RN is teaching incentive spirometer.  Wyman Songster [facility] 08/25/2019, 11:31 PM

## 2019-08-25 NOTE — Progress Notes (Signed)
MD notified of Contrast media allergy. Per request, ICG dye used and patient medicated with steroids. Will continue to monitor.

## 2019-08-25 NOTE — Anesthesia Procedure Notes (Signed)
Central Venous Catheter Insertion Performed by: Suzette Battiest, MD, anesthesiologist Start/End4/28/2021 6:55 AM, 08/25/2019 7:10 AM Patient location: Pre-op. Preanesthetic checklist: patient identified, IV checked, site marked, risks and benefits discussed, surgical consent, monitors and equipment checked, pre-op evaluation, timeout performed and anesthesia consent Hand hygiene performed  and maximum sterile barriers used  PA cath was placed.Swan type:thermodilution Procedure performed without using ultrasound guided technique. Attempts: 1 Patient tolerated the procedure well with no immediate complications.

## 2019-08-25 NOTE — Plan of Care (Signed)
  Problem: Clinical Measurements: Goal: Ability to maintain clinical measurements within normal limits will improve Outcome: Progressing   Problem: Clinical Measurements: Goal: Cardiovascular complication will be avoided Outcome: Progressing   Problem: Coping: Goal: Level of anxiety will decrease Outcome: Progressing   Problem: Clinical Measurements: Goal: Postoperative complications will be avoided or minimized Outcome: Progressing

## 2019-08-25 NOTE — Anesthesia Procedure Notes (Signed)
Procedure Name: Intubation Date/Time: 08/25/2019 8:03 AM Performed by: Bryson Corona, CRNA Pre-anesthesia Checklist: Patient identified, Emergency Drugs available, Suction available and Patient being monitored Patient Re-evaluated:Patient Re-evaluated prior to induction Oxygen Delivery Method: Circle System Utilized Preoxygenation: Pre-oxygenation with 100% oxygen Induction Type: IV induction and Rapid sequence Ventilation: Oral airway inserted - appropriate to patient size and Two handed mask ventilation required Laryngoscope Size: Glidescope and 4 Grade View: Grade I Tube type: Oral Tube size: 8.0 mm Number of attempts: 1 Airway Equipment and Method: Stylet and Oral airway Placement Confirmation: ETT inserted through vocal cords under direct vision,  positive ETCO2 and breath sounds checked- equal and bilateral Secured at: 23 cm Tube secured with: Tape Dental Injury: Teeth and Oropharynx as per pre-operative assessment  Difficulty Due To: Difficult Airway- due to anterior larynx and Difficult Airway- due to large tongue Future Recommendations: Recommend- induction with short-acting agent, and alternative techniques readily available Comments: Short chin, large neck, and extra oral tissue. Elective glidescope.

## 2019-08-25 NOTE — Anesthesia Procedure Notes (Signed)
Arterial Line Insertion Start/End4/28/2021 7:15 AM, 08/25/2019 7:30 AM Performed by: Suzette Battiest, MD, anesthesiologist  Patient location: Pre-op. Preanesthetic checklist: patient identified, IV checked, site marked, risks and benefits discussed, surgical consent, monitors and equipment checked, pre-op evaluation, timeout performed and anesthesia consent Lidocaine 1% used for infiltration Right, radial was placed Catheter size: 20 Fr Hand hygiene performed  and maximum sterile barriers used   Attempts: 2 Procedure performed using ultrasound guided technique. Ultrasound Notes:anatomy identified, needle tip was noted to be adjacent to the nerve/plexus identified, no ultrasound evidence of intravascular and/or intraneural injection and image(s) printed for medical record Following insertion, dressing applied and Biopatch. Post procedure assessment: normal and unchanged  Patient tolerated the procedure well with no immediate complications. Additional procedure comments: Attempted right brachial arterial line but brachial plexus overlying vessel. Brief paresthesia on attempted pass that resolved completely afterwards. Switched to high radial arterial line successfully done under ultrasound guidance.Marland Kitchen

## 2019-08-25 NOTE — Progress Notes (Signed)
[employer] rep paged per anesthesia request.

## 2019-08-25 NOTE — Procedures (Signed)
Pre-op ABG ordered for this patient.  Draw attempted by two separate RRT's, both were unsuccessful.  ABG order cancelled.  RN Brayton Layman was informed.

## 2019-08-25 NOTE — H&P (Signed)
History and Physical Interval Note:  08/25/2019 7:23 AM  John Dodson  has presented today for surgery, with the diagnosis of CORONARY ARTERY DISEASE.  The various methods of treatment have been discussed with the patient and family. After consideration of risks, benefits and other options for treatment, the patient has consented to  Procedure(s) with comments: CORONARY ARTERY BYPASS GRAFTING (CABG) (N/A) - BILATERAL IMA RADIAL ARTERY HARVEST (Left) TRANSESOPHAGEAL ECHOCARDIOGRAM (TEE) (N/A) INDOCYANINE GREEN FLUORESCENCE IMAGING (ICG) (N/A) as a surgical intervention.  The patient's history has been reviewed, patient examined, no change in status, stable for surgery.  I have reviewed the patient's chart and labs.  Questions were answered to the patient's satisfaction.     John Dodson

## 2019-08-25 NOTE — Anesthesia Postprocedure Evaluation (Signed)
Anesthesia Post Note  Patient: John Dodson  Procedure(s) Performed: CORONARY ARTERY BYPASS GRAFTING (CABG) TIMES FOUR, ON PUMP, USING LEFT AND RIGHT INTERNAL MAMMARY ARTERY AND LEFT RADIAL ARTERY (N/A Chest) RADIAL ARTERY HARVEST (Left Arm Lower) TRANSESOPHAGEAL ECHOCARDIOGRAM (TEE) (N/A ) INDOCYANINE GREEN FLUORESCENCE IMAGING (ICG) (N/A )     Patient location during evaluation: SICU Anesthesia Type: General Level of consciousness: sedated Pain management: pain level controlled Vital Signs Assessment: post-procedure vital signs reviewed and stable Respiratory status: patient remains intubated per anesthesia plan Cardiovascular status: stable Postop Assessment: no apparent nausea or vomiting Anesthetic complications: no    Last Vitals:  Vitals:   08/25/19 0440 08/25/19 1452  BP: 134/75 103/61  Pulse: 77 88  Resp:  16  Temp: 37.5 C   SpO2: 100% 100%    Last Pain:  Vitals:   08/25/19 0440  TempSrc: Oral  PainSc:                  Tiajuana Amass

## 2019-08-26 ENCOUNTER — Inpatient Hospital Stay (HOSPITAL_COMMUNITY): Payer: Medicare Other

## 2019-08-26 LAB — POCT I-STAT 7, (LYTES, BLD GAS, ICA,H+H)
Acid-base deficit: 4 mmol/L — ABNORMAL HIGH (ref 0.0–2.0)
Acid-base deficit: 4 mmol/L — ABNORMAL HIGH (ref 0.0–2.0)
Acid-base deficit: 5 mmol/L — ABNORMAL HIGH (ref 0.0–2.0)
Acid-base deficit: 7 mmol/L — ABNORMAL HIGH (ref 0.0–2.0)
Bicarbonate: 19.3 mmol/L — ABNORMAL LOW (ref 20.0–28.0)
Bicarbonate: 20.9 mmol/L (ref 20.0–28.0)
Bicarbonate: 21.1 mmol/L (ref 20.0–28.0)
Bicarbonate: 21.8 mmol/L (ref 20.0–28.0)
Calcium, Ion: 1.12 mmol/L — ABNORMAL LOW (ref 1.15–1.40)
Calcium, Ion: 1.17 mmol/L (ref 1.15–1.40)
Calcium, Ion: 1.21 mmol/L (ref 1.15–1.40)
Calcium, Ion: 1.23 mmol/L (ref 1.15–1.40)
HCT: 28 % — ABNORMAL LOW (ref 39.0–52.0)
HCT: 28 % — ABNORMAL LOW (ref 39.0–52.0)
HCT: 30 % — ABNORMAL LOW (ref 39.0–52.0)
HCT: 30 % — ABNORMAL LOW (ref 39.0–52.0)
Hemoglobin: 10.2 g/dL — ABNORMAL LOW (ref 13.0–17.0)
Hemoglobin: 10.2 g/dL — ABNORMAL LOW (ref 13.0–17.0)
Hemoglobin: 9.5 g/dL — ABNORMAL LOW (ref 13.0–17.0)
Hemoglobin: 9.5 g/dL — ABNORMAL LOW (ref 13.0–17.0)
O2 Saturation: 78 %
O2 Saturation: 90 %
O2 Saturation: 98 %
O2 Saturation: 98 %
Patient temperature: 37
Patient temperature: 37
Patient temperature: 98.9
Patient temperature: 98.9
Potassium: 4.4 mmol/L (ref 3.5–5.1)
Potassium: 4.5 mmol/L (ref 3.5–5.1)
Potassium: 4.6 mmol/L (ref 3.5–5.1)
Potassium: 4.7 mmol/L (ref 3.5–5.1)
Sodium: 136 mmol/L (ref 135–145)
Sodium: 138 mmol/L (ref 135–145)
Sodium: 140 mmol/L (ref 135–145)
Sodium: 140 mmol/L (ref 135–145)
TCO2: 21 mmol/L — ABNORMAL LOW (ref 22–32)
TCO2: 22 mmol/L (ref 22–32)
TCO2: 22 mmol/L (ref 22–32)
TCO2: 23 mmol/L (ref 22–32)
pCO2 arterial: 35.1 mmHg (ref 32.0–48.0)
pCO2 arterial: 42.5 mmHg (ref 32.0–48.0)
pCO2 arterial: 43.9 mmHg (ref 32.0–48.0)
pCO2 arterial: 44.4 mmHg (ref 32.0–48.0)
pH, Arterial: 7.265 — ABNORMAL LOW (ref 7.350–7.450)
pH, Arterial: 7.286 — ABNORMAL LOW (ref 7.350–7.450)
pH, Arterial: 7.304 — ABNORMAL LOW (ref 7.350–7.450)
pH, Arterial: 7.384 (ref 7.350–7.450)
pO2, Arterial: 112 mmHg — ABNORMAL HIGH (ref 83.0–108.0)
pO2, Arterial: 49 mmHg — ABNORMAL LOW (ref 83.0–108.0)
pO2, Arterial: 65 mmHg — ABNORMAL LOW (ref 83.0–108.0)
pO2, Arterial: 99 mmHg (ref 83.0–108.0)

## 2019-08-26 LAB — CBC
HCT: 31 % — ABNORMAL LOW (ref 39.0–52.0)
HCT: 31.9 % — ABNORMAL LOW (ref 39.0–52.0)
Hemoglobin: 9.8 g/dL — ABNORMAL LOW (ref 13.0–17.0)
Hemoglobin: 9.9 g/dL — ABNORMAL LOW (ref 13.0–17.0)
MCH: 27.3 pg (ref 26.0–34.0)
MCH: 27.2 pg (ref 26.0–34.0)
MCHC: 31.6 g/dL (ref 30.0–36.0)
MCHC: 31 g/dL (ref 30.0–36.0)
MCV: 86.4 fL (ref 80.0–100.0)
MCV: 87.6 fL (ref 80.0–100.0)
Platelets: 121 10*3/uL — ABNORMAL LOW (ref 150–400)
Platelets: 157 10*3/uL (ref 150–400)
RBC: 3.59 MIL/uL — ABNORMAL LOW (ref 4.22–5.81)
RBC: 3.64 MIL/uL — ABNORMAL LOW (ref 4.22–5.81)
RDW: 14.8 % (ref 11.5–15.5)
RDW: 15.4 % (ref 11.5–15.5)
WBC: 13.2 10*3/uL — ABNORMAL HIGH (ref 4.0–10.5)
WBC: 21.8 10*3/uL — ABNORMAL HIGH (ref 4.0–10.5)
nRBC: 0 % (ref 0.0–0.2)
nRBC: 0 % (ref 0.0–0.2)

## 2019-08-26 LAB — ECHO INTRAOPERATIVE TEE
Height: 68 in
Weight: 3331.2 oz

## 2019-08-26 LAB — BASIC METABOLIC PANEL
Anion gap: 8 (ref 5–15)
Anion gap: 9 (ref 5–15)
BUN: 21 mg/dL — ABNORMAL HIGH (ref 6–20)
BUN: 26 mg/dL — ABNORMAL HIGH (ref 6–20)
CO2: 22 mmol/L (ref 22–32)
CO2: 20 mmol/L — ABNORMAL LOW (ref 22–32)
Calcium: 8 mg/dL — ABNORMAL LOW (ref 8.9–10.3)
Calcium: 8.1 mg/dL — ABNORMAL LOW (ref 8.9–10.3)
Chloride: 109 mmol/L (ref 98–111)
Chloride: 107 mmol/L (ref 98–111)
Creatinine, Ser: 1.38 mg/dL — ABNORMAL HIGH (ref 0.61–1.24)
Creatinine, Ser: 1.52 mg/dL — ABNORMAL HIGH (ref 0.61–1.24)
GFR calc Af Amer: >60 mL/min (ref >60)
GFR calc Af Amer: 58 mL/min — ABNORMAL LOW (ref >60)
GFR calc non Af Amer: 56 mL/min — ABNORMAL LOW (ref >60)
GFR calc non Af Amer: 50 mL/min — ABNORMAL LOW (ref >60)
Glucose, Bld: 122 mg/dL — ABNORMAL HIGH (ref 70–99)
Glucose, Bld: 138 mg/dL — ABNORMAL HIGH (ref 70–99)
Potassium: 4.7 mmol/L (ref 3.5–5.1)
Potassium: 5 mmol/L (ref 3.5–5.1)
Sodium: 139 mmol/L (ref 135–145)
Sodium: 136 mmol/L (ref 135–145)

## 2019-08-26 LAB — MAGNESIUM
Magnesium: 2.9 mg/dL — ABNORMAL HIGH (ref 1.7–2.4)
Magnesium: 2.8 mg/dL — ABNORMAL HIGH (ref 1.7–2.4)

## 2019-08-26 LAB — GLUCOSE, CAPILLARY
Glucose-Capillary: 107 mg/dL — ABNORMAL HIGH (ref 70–99)
Glucose-Capillary: 107 mg/dL — ABNORMAL HIGH (ref 70–99)
Glucose-Capillary: 107 mg/dL — ABNORMAL HIGH (ref 70–99)
Glucose-Capillary: 112 mg/dL — ABNORMAL HIGH (ref 70–99)
Glucose-Capillary: 113 mg/dL — ABNORMAL HIGH (ref 70–99)
Glucose-Capillary: 115 mg/dL — ABNORMAL HIGH (ref 70–99)
Glucose-Capillary: 117 mg/dL — ABNORMAL HIGH (ref 70–99)
Glucose-Capillary: 118 mg/dL — ABNORMAL HIGH (ref 70–99)
Glucose-Capillary: 123 mg/dL — ABNORMAL HIGH (ref 70–99)
Glucose-Capillary: 123 mg/dL — ABNORMAL HIGH (ref 70–99)
Glucose-Capillary: 132 mg/dL — ABNORMAL HIGH (ref 70–99)
Glucose-Capillary: 136 mg/dL — ABNORMAL HIGH (ref 70–99)
Glucose-Capillary: 136 mg/dL — ABNORMAL HIGH (ref 70–99)
Glucose-Capillary: 137 mg/dL — ABNORMAL HIGH (ref 70–99)
Glucose-Capillary: 141 mg/dL — ABNORMAL HIGH (ref 70–99)
Glucose-Capillary: 144 mg/dL — ABNORMAL HIGH (ref 70–99)
Glucose-Capillary: 146 mg/dL — ABNORMAL HIGH (ref 70–99)
Glucose-Capillary: 149 mg/dL — ABNORMAL HIGH (ref 70–99)
Glucose-Capillary: 211 mg/dL — ABNORMAL HIGH (ref 70–99)

## 2019-08-26 LAB — BPAM PLATELET PHERESIS
Blood Product Expiration Date: 202104292359
ISSUE DATE / TIME: 202104281348
Unit Type and Rh: 6200

## 2019-08-26 LAB — PREPARE PLATELET PHERESIS: Unit division: 0

## 2019-08-26 MED ORDER — ACETAMINOPHEN 10 MG/ML IV SOLN
1000.0000 mg | Freq: Four times a day (QID) | INTRAVENOUS | Status: AC
  Administered 2019-08-26 – 2019-08-27 (×4): 1000 mg via INTRAVENOUS
  Filled 2019-08-26 (×4): qty 100

## 2019-08-26 MED ORDER — COLCHICINE 0.3 MG HALF TABLET
0.3000 mg | ORAL_TABLET | Freq: Two times a day (BID) | ORAL | Status: DC
  Administered 2019-08-26 – 2019-08-31 (×11): 0.3 mg via ORAL
  Filled 2019-08-26 (×12): qty 1

## 2019-08-26 MED ORDER — DIAZEPAM 2 MG PO TABS
2.0000 mg | ORAL_TABLET | Freq: Three times a day (TID) | ORAL | Status: DC
  Administered 2019-08-26 – 2019-08-28 (×6): 2 mg via ORAL
  Filled 2019-08-26 (×6): qty 1

## 2019-08-26 MED ORDER — LEVALBUTEROL HCL 0.63 MG/3ML IN NEBU
0.6300 mg | INHALATION_SOLUTION | Freq: Three times a day (TID) | RESPIRATORY_TRACT | Status: AC
  Administered 2019-08-26 – 2019-08-27 (×6): 0.63 mg via RESPIRATORY_TRACT
  Filled 2019-08-26 (×6): qty 3

## 2019-08-26 MED ORDER — ISOSORBIDE DINITRATE 10 MG PO TABS
10.0000 mg | ORAL_TABLET | Freq: Three times a day (TID) | ORAL | Status: DC
  Administered 2019-08-26 – 2019-08-31 (×16): 10 mg via ORAL
  Filled 2019-08-26 (×17): qty 1

## 2019-08-26 MED FILL — Heparin Sodium (Porcine) Inj 1000 Unit/ML: INTRAMUSCULAR | Qty: 10 | Status: AC

## 2019-08-26 MED FILL — Sodium Bicarbonate IV Soln 8.4%: INTRAVENOUS | Qty: 50 | Status: AC

## 2019-08-26 MED FILL — Sodium Chloride IV Soln 0.9%: INTRAVENOUS | Qty: 2000 | Status: AC

## 2019-08-26 MED FILL — Mannitol IV Soln 20%: INTRAVENOUS | Qty: 500 | Status: AC

## 2019-08-26 MED FILL — Electrolyte-R (PH 7.4) Solution: INTRAVENOUS | Qty: 5000 | Status: AC

## 2019-08-26 NOTE — Progress Notes (Signed)
RN called RT to assess patient due to spo2 decrease. RT gave patient scheduled Xopenex treatment and placed patient on 15L NRB. Patient's spo2 74%. RT obtained arterial blood gas and placed patient on BIPAP per ABG results and patient's increased WOB. Patient is tolerating BIPAP well at this time. Spo2 increased from 70% to 89%. RT will continue to monitor.

## 2019-08-26 NOTE — Addendum Note (Signed)
Addendum  created 08/26/19 0806 by Josephine Igo, CRNA   Order list changed

## 2019-08-26 NOTE — Evaluation (Signed)
Physical Therapy Evaluation Patient Details Name: John Dodson MRN: [id number] DOB: 07-07-1960 Today's Date: 08/26/2019   History of Present Illness  Patient is a 59 y/o male with PMH DM, CAD, CKD, CHB s/p PPM, NSTEMI, Basal cell CA, peripheral neuropathy, Covid infection 12/20, bilat PE 1/21, and h/o coronary stenting presented with CP.  Cath revealed stent stenoses pt now s/p CABG x 4 08/25/19.  Clinical Impression  Patient presents with decreased mobility due to pain, decreased activity tolerance, decreased strength and balance and he will benefit from skilled PT in the acute setting prior to d/c home.  Likely to progress and not need follow up PT at d/c.     Follow Up Recommendations No PT follow up    Equipment Recommendations  Other (comment)(TBA)    Recommendations for Other Services       Precautions / Restrictions Precautions Precautions: Sternal;Fall Precaution Comments: educated on sternal precautions      Mobility  Bed Mobility Overal bed mobility: Needs Assistance Bed Mobility: Rolling;Sidelying to Sit Rolling: Min assist Sidelying to sit: Mod assist       General bed mobility comments: lifting assist for trunk  Transfers Overall transfer level: Needs assistance Equipment used: None Transfers: Sit to/from Stand;Stand Pivot Transfers Sit to Stand: Min assist Stand pivot transfers: Min assist       General transfer comment: RN x 2 in the room helping with lines as pt sweaty and a-line, swan not sticking well to his skin; stood with cues and rocking momentum pillow at chest, then smal shuffling steps to chair  Ambulation/Gait             General Gait Details: NT  Stairs            Wheelchair Mobility    Modified Rankin (Stroke Patients Only)       Balance Overall balance assessment: Needs assistance   Sitting balance-Leahy Scale: Fair     Standing balance support: No upper extremity supported Standing balance-Leahy Scale:  Fair Standing balance comment: close guard assist                             Pertinent Vitals/Pain Pain Assessment: Faces Faces Pain Scale: Hurts worst Pain Location: back Pain Descriptors / Indicators: Aching;Grimacing;Moaning Pain Intervention(s): Monitored during session;Repositioned;Premedicated before session;RN gave pain meds during session    [place] expects to be discharged to:: Private residence Living Arrangements: Spouse/significant other Available Help at Discharge: Family Type of Home: House Home Access: Stairs to enter   Technical brewer of Steps: 2 Home Layout: One level Home Equipment: Other (comment) Additional Comments: c-pap    Prior Function Level of Independence: Needs assistance;Independent               Hand Dominance        Extremity/Trunk Assessment        Lower Extremity Assessment Lower Extremity Assessment: Generalized weakness       Communication   Communication: No difficulties  Cognition Arousal/Alertness: Awake/alert Behavior During Therapy: Anxious Overall Cognitive Status: Within Functional Limits for tasks assessed                                 General Comments: seems WFL, not formally tested due to pt in significant pain      General Comments General comments (skin integrity, edema, etc.): VSS though had to remove  O2 for chair as not close enough, alarm blaring initially when cable re-connected with SpO2 in 80's and quickly back to 91% when 4L O2 reapplied    Exercises     Assessment/Plan    PT Assessment Patient needs continued PT services  PT Problem List Decreased activity tolerance;Decreased mobility;Pain;Decreased balance;Decreased knowledge of precautions;Decreased safety awareness       PT Treatment Interventions DME instruction;Stair training;Therapeutic activities;Balance training;Patient/family education;Therapeutic exercise;Functional mobility  training;Gait training    PT Goals (Current goals can be found in the Care Plan section)  Acute Rehab PT Goals Patient Stated Goal: pain control PT Goal Formulation: With patient Time For Goal Achievement: 09/09/19 Potential to Achieve Goals: Good    Frequency Min 3X/week   Barriers to discharge        Co-evaluation               AM-PAC PT "6 Clicks" Mobility  Outcome Measure Help needed turning from your back to your side while in a flat bed without using bedrails?: A Little Help needed moving from lying on your back to sitting on the side of a flat bed without using bedrails?: A Lot Help needed moving to and from a bed to a chair (including a wheelchair)?: A Little Help needed standing up from a chair using your arms (e.g., wheelchair or bedside chair)?: A Little Help needed to walk in hospital room?: A Lot Help needed climbing 3-5 steps with a railing? : A Lot 6 Click Score: 15    End of Session Equipment Utilized During Treatment: Oxygen Activity Tolerance: Patient limited by pain Patient left: in chair;with call bell/phone within reach;with nursing/sitter in room Nurse Communication: Mobility status PT Visit Diagnosis: Difficulty in walking, not elsewhere classified (R26.2);Muscle weakness (generalized) (M62.81);Pain Pain - part of body: (back/chest)    Time: [id number] PT Time Calculation (min) (ACUTE ONLY): 26 min   Charges:   PT Evaluation $PT Eval Moderate Complexity: 1 Mod PT Treatments $Therapeutic Activity: 8-22 mins        Magda Kiel, [place] [facility] [phone] 08/26/2019   Reginia Naas 08/26/2019, 10:05 AM

## 2019-08-26 NOTE — Discharge Instructions (Signed)

## 2019-08-26 NOTE — Op Note (Signed)
CARDIOTHORACIC SURGERY OPERATIVE NOTE  Date of Procedure: 08/25/19 Preoperative Diagnosis: Severe 3-vessel Coronary Artery Disease, h/o PCI  Postoperative Diagnosis: Same  Procedure:    Coronary Artery Bypass Grafting x 4  Left Internal Mammary Artery to Distal Left Anterior Descending Coronary Artery; left radial artery graft to left posterior Descending Coronary Artery mid marginal Branch of Left Circumflex Coronary Artery is a sequenced graft, pedicled right internal mammary graft to ramus intermedius coronary Artery Bilateral internal mammary artery harvesting Open Left radial artery harvesting multilevel level rib block Completion graft surveillance with indocyanine green fluorescence angiography (SPY) Surgeon: B. Murvin Natal, MD  Assistant: Josie Saunders PA-C  Anesthesia: get  Operative Findings:  Preserved left ventricular systolic function  good quality internal mammary artery conduits  Good quality radial conduit  Fair-to-poor quality target vessels for grafting    BRIEF CLINICAL NOTE AND INDICATIONS FOR SURGERY  59 year old gentleman with a long history of coronary artery disease and status post PCI presented recurrent chest pain which was evaluated with early catheterization given his extensive history.  This demonstrated severe multivessel coronary artery disease.  He is referred for CABG.  Patient has undergone a thorough work-up and is considered a good candidate for the procedure.   DETAILS OF THE OPERATIVE PROCEDURE  Preparation:  The patient is brought to the operating room on the above mentioned date and central monitoring was established by the anesthesia team including placement of Swan-Ganz catheter and radial arterial line. The patient is placed in the supine position on the operating table.  Intravenous antibiotics are administered. General endotracheal anesthesia is induced uneventfully. A Foley catheter is placed.   The patient's chest, abdomen,  both groins, upper extremity, and both lower extremities are prepared and draped in a sterile manner. A time out procedure is performed.   Surgical Approach and Conduit Harvest:  A median sternotomy incision was performed and the left internal mammary artery is dissected from the chest wall and prepared for bypass grafting. The left internal mammary artery is notably good quality conduit. Simultaneously, the left radial artery is harvested using an open technique.  Once the radial was removed the wound is copiously irrigated and repaired in layers.  The arm is retucked at the side.  Next, attention is turned to the right chest wall over the right internal mammary artery and its pedicle are mobilized in a standard fashion.  Prior to dividing the pedicle distally full dose heparin is given.  Then, both mammary arteries are divided and treated with solution of papaverine.  Multilevel rib block is undertaken with Exparel solution injected directly into the interspaces visible through the open sternum.  Extracorporeal Cardiopulmonary Bypass and Myocardial Protection:  The pericardium is opened. The ascending aorta is nondiseased in appearance. The ascending aorta and the right atrium are cannulated for cardiopulmonary bypass.  Adequate heparinization is verified.     Cardiopulmonary bypass was begun and the surface of the heart is inspected. Distal target vessels are selected for coronary artery bypass grafting. A cardioplegia cannula is placed in the ascending aorta.  The patient is allowed to cool passively to 34C systemic temperature.  The aortic cross clamp is applied and cold blood cardioplegia is delivered initially in an antegrade fashion through the aortic root. The initial cardioplegic arrest is rapid with early diastolic arrest.  Repeat doses of cardioplegia are administered intermittently throughout the entire cross clamp portion of the operation through the aortic root  and through subsequently  placed grafts in order to maintain completely  flat electrocardiogram.  Coronary Artery Bypass Grafting:   The posterior descending branch of the left coronary artery was grafted using the left radial artery graft in an end-to-side fashion.  At the site of distal anastomosis the target vessel was poor quality and measured approximately 1 mm in diameter. Next, the mid-marginal branch of the left circumflex coronary artery was grafted in a side-to-side fashion with the radial graft to create a sequenced configuration.  At the site of distal anastomosis the target vessel was fair quality and measured approximately 1.5 mm in diameter. Anastomotic patency and runoff was confirmed with indocyanine green fluorescence imaging (SPY).  The ramus intermedius branch of the circumflex coronary artery was grafted in and end-to-side fashion using the right internal mammary artery graft which was brought through the transverse sinus.   At the site of distal anastomosis the target vessel was fair quality and measured approximately 1.5  mm in diameter. Anastomotic patency and runoff was confirmed with indocyanine green fluorescence imaging (SPY).  The distal left anterior coronary artery was grafted with the left internal mammary artery in an end-to-side fashion.  At the site of distal anastomosis the target vessel was fair quality and measured approximately 1.5 mm in diameter. Anastomotic patency and runoff was confirmed with indocyanine green fluorescence imaging (SPY).  The proximal radial artery anastomosis was anastomosed to the side of the LIMA with a running 7-0 prolene  prior to removal of the aortic cross clamp to create a T-graft configuration.  Deairing procedures were performed and the aortic cross clamp was removed.   Procedure Completion:  All proximal and distal coronary anastomoses were inspected for hemostasis and appropriate graft orientation. Epicardial pacing wires are fixed to the right ventricular  outflow tract and to the right atrial appendage. The patient is rewarmed to 37C temperature. The patient is weaned and disconnected from cardiopulmonary bypass.  The patient's rhythm at separation from bypass was paced with his internal pacer.  The patient was weaned from cardiopulmonary bypass  without any inotropic support.  Followup transesophageal echocardiogram performed after separation from bypass revealed  no changes from the preoperative exam.  The aortic and venous cannula were removed uneventfully. Protamine was administered to reverse the anticoagulation. The mediastinum and pleural space were inspected for hemostasis and irrigated with saline solution. The mediastinum and bilateral pleural space were drained using fluted chest tubes placed through separate stab incisions inferiorly.  The soft tissues anterior to the aorta were reapproximated loosely. The sternum is closed with double strength sternal wire. The soft tissues anterior to the sternum were closed in multiple layers and the skin is closed with a running subcuticular skin closure.  The post-bypass portion of the operation was notable for stable rhythm and hemodynamics.  No blood products were administered during the operation.   Disposition:  The patient tolerated the procedure well and is transported to the surgical intensive care in stable condition. There are no intraoperative complications. All sponge instrument and needle counts are verified correct at completion of the operation.    Jayme Cloud, MD 08/26/2019 7:11 AM

## 2019-08-26 NOTE — Hospital Course (Addendum)
Mr. John Dodson is a 59 yo male with known history of CKD Stage 3, HTN, H/O CHB S/P PPM, Hyperlipidemia, DM, and H/O CAD S/p PCI.  The patient was in his usual state of health until last week.  He developed resting chest pain that was sharp and radiated into his bilateral upper extremities and his jaw.  The patient took SL NTG which provided some relief.  He presented for evaluation with Dr. Ellyn Hack who felt he should undergo cardiac catheterization.  This was performed on 08/20/2019 and showed multivessel CAD.  It was felt coronary bypass grafting would be indicated, he was admitted, and TCTS consult was obtained.     Hospital course:  The patient remained chest pain free.  He was evaluated by Dr. Orvan Seen who was in agreement the patient would benefit from coronary bypass grafting procedure.  The risks and benefits of the procedure were explained to the patient and he was agreeable to proceed.  He was taken to the operating room and underwent CABG x 4 LIMA to LAD, sequential radial artery to OM 2 and PDA, and RIMA to Ramus.  He underwent open harvest of left radial artery.  The patient tolerated the procedure without difficulty and was taken to the SICU in stable condition. The patient was extubated the evening of surgery.  During his stay in the SICU the patient was weaned off NTG drip as tolerated.  He was started on Isordil for radial artery graft.  His chest tubes and arterial lines were removed without difficulty.

## 2019-08-26 NOTE — Progress Notes (Signed)
TCTS BRIEF SICU PROGRESS NOTE  1 Day Post-Op  S/P Procedure(s) (LRB): CORONARY ARTERY BYPASS GRAFTING (CABG) TIMES FOUR, ON PUMP, USING LEFT AND RIGHT INTERNAL MAMMARY ARTERY AND LEFT RADIAL ARTERY (N/A) RADIAL ARTERY HARVEST (Left) TRANSESOPHAGEAL ECHOCARDIOGRAM (TEE) (N/A) INDOCYANINE GREEN FLUORESCENCE IMAGING (ICG) (N/A)   Feels okay Paced rhythm w/ stable BP Breathing comfortably on 4 L/min UOP marginal but adequate Labs okay although creatinine up slightly 1.5  Plan: Continue current plan  Rexene Alberts, MD 08/26/2019 7:18 PM

## 2019-08-27 ENCOUNTER — Inpatient Hospital Stay (HOSPITAL_COMMUNITY): Payer: Medicare Other

## 2019-08-27 DIAGNOSIS — I314 Cardiac tamponade: Secondary | ICD-10-CM | POA: Diagnosis not present

## 2019-08-27 LAB — CBC
HCT: 31.7 % — ABNORMAL LOW (ref 39.0–52.0)
Hemoglobin: 9.9 g/dL — ABNORMAL LOW (ref 13.0–17.0)
MCH: 27.9 pg (ref 26.0–34.0)
MCHC: 31.2 g/dL (ref 30.0–36.0)
MCV: 89.3 fL (ref 80.0–100.0)
Platelets: 165 10*3/uL (ref 150–400)
RBC: 3.55 MIL/uL — ABNORMAL LOW (ref 4.22–5.81)
RDW: 15.5 % (ref 11.5–15.5)
WBC: 22.9 10*3/uL — ABNORMAL HIGH (ref 4.0–10.5)
nRBC: 0 % (ref 0.0–0.2)

## 2019-08-27 LAB — BASIC METABOLIC PANEL
Anion gap: 11 (ref 5–15)
Anion gap: 13 (ref 5–15)
BUN: 31 mg/dL — ABNORMAL HIGH (ref 6–20)
BUN: 37 mg/dL — ABNORMAL HIGH (ref 6–20)
CO2: 20 mmol/L — ABNORMAL LOW (ref 22–32)
CO2: 22 mmol/L (ref 22–32)
Calcium: 8.3 mg/dL — ABNORMAL LOW (ref 8.9–10.3)
Calcium: 8.2 mg/dL — ABNORMAL LOW (ref 8.9–10.3)
Chloride: 107 mmol/L (ref 98–111)
Chloride: 104 mmol/L (ref 98–111)
Creatinine, Ser: 1.84 mg/dL — ABNORMAL HIGH (ref 0.61–1.24)
Creatinine, Ser: 1.9 mg/dL — ABNORMAL HIGH (ref 0.61–1.24)
GFR calc Af Amer: 46 mL/min — ABNORMAL LOW (ref >60)
GFR calc Af Amer: 44 mL/min — ABNORMAL LOW (ref >60)
GFR calc non Af Amer: 40 mL/min — ABNORMAL LOW (ref >60)
GFR calc non Af Amer: 38 mL/min — ABNORMAL LOW (ref >60)
Glucose, Bld: 141 mg/dL — ABNORMAL HIGH (ref 70–99)
Glucose, Bld: 199 mg/dL — ABNORMAL HIGH (ref 70–99)
Potassium: 4.8 mmol/L (ref 3.5–5.1)
Potassium: 4 mmol/L (ref 3.5–5.1)
Sodium: 138 mmol/L (ref 135–145)
Sodium: 139 mmol/L (ref 135–145)

## 2019-08-27 LAB — POCT I-STAT 7, (LYTES, BLD GAS, ICA,H+H)
Acid-base deficit: 6 mmol/L — ABNORMAL HIGH (ref 0.0–2.0)
Bicarbonate: 19.8 mmol/L — ABNORMAL LOW (ref 20.0–28.0)
Calcium, Ion: 1.22 mmol/L (ref 1.15–1.40)
HCT: 30 % — ABNORMAL LOW (ref 39.0–52.0)
Hemoglobin: 10.2 g/dL — ABNORMAL LOW (ref 13.0–17.0)
O2 Saturation: 82 %
Patient temperature: 98.9
Potassium: 4.9 mmol/L (ref 3.5–5.1)
Sodium: 139 mmol/L (ref 135–145)
TCO2: 21 mmol/L — ABNORMAL LOW (ref 22–32)
pCO2 arterial: 39.2 mmHg (ref 32.0–48.0)
pH, Arterial: 7.312 — ABNORMAL LOW (ref 7.350–7.450)
pO2, Arterial: 51 mmHg — ABNORMAL LOW (ref 83.0–108.0)

## 2019-08-27 LAB — ECHOCARDIOGRAM LIMITED
Height: 68 in
Weight: 3612.02 oz

## 2019-08-27 LAB — MAGNESIUM: Magnesium: 2.7 mg/dL — ABNORMAL HIGH (ref 1.7–2.4)

## 2019-08-27 LAB — GLUCOSE, CAPILLARY
Glucose-Capillary: 115 mg/dL — ABNORMAL HIGH (ref 70–99)
Glucose-Capillary: 121 mg/dL — ABNORMAL HIGH (ref 70–99)
Glucose-Capillary: 122 mg/dL — ABNORMAL HIGH (ref 70–99)
Glucose-Capillary: 133 mg/dL — ABNORMAL HIGH (ref 70–99)
Glucose-Capillary: 136 mg/dL — ABNORMAL HIGH (ref 70–99)
Glucose-Capillary: 146 mg/dL — ABNORMAL HIGH (ref 70–99)
Glucose-Capillary: 146 mg/dL — ABNORMAL HIGH (ref 70–99)
Glucose-Capillary: 153 mg/dL — ABNORMAL HIGH (ref 70–99)
Glucose-Capillary: 167 mg/dL — ABNORMAL HIGH (ref 70–99)
Glucose-Capillary: 168 mg/dL — ABNORMAL HIGH (ref 70–99)
Glucose-Capillary: 172 mg/dL — ABNORMAL HIGH (ref 70–99)
Glucose-Capillary: 191 mg/dL — ABNORMAL HIGH (ref 70–99)

## 2019-08-27 MED ORDER — INSULIN DETEMIR 100 UNIT/ML ~~LOC~~ SOLN
10.0000 [IU] | Freq: Two times a day (BID) | SUBCUTANEOUS | Status: DC
  Administered 2019-08-27 – 2019-08-29 (×6): 10 [IU] via SUBCUTANEOUS
  Filled 2019-08-27 (×10): qty 0.1

## 2019-08-27 MED ORDER — SODIUM CHLORIDE 0.9% FLUSH
10.0000 mL | Freq: Two times a day (BID) | INTRAVENOUS | Status: DC
  Administered 2019-08-27 – 2019-08-29 (×6): 10 mL

## 2019-08-27 MED ORDER — SODIUM BICARBONATE 8.4 % IV SOLN
100.0000 meq | Freq: Once | INTRAVENOUS | Status: AC
  Administered 2019-08-27: 08:00:00 100 meq via INTRAVENOUS
  Filled 2019-08-27: qty 100

## 2019-08-27 MED ORDER — CLOPIDOGREL BISULFATE 75 MG PO TABS
75.0000 mg | ORAL_TABLET | Freq: Every day | ORAL | Status: DC
  Administered 2019-08-27 – 2019-08-31 (×5): 75 mg via ORAL
  Filled 2019-08-27 (×5): qty 1

## 2019-08-27 MED ORDER — ORAL CARE MOUTH RINSE
15.0000 mL | Freq: Two times a day (BID) | OROMUCOSAL | Status: DC
  Administered 2019-08-27 – 2019-08-29 (×4): 15 mL via OROMUCOSAL

## 2019-08-27 MED ORDER — INSULIN ASPART 100 UNIT/ML ~~LOC~~ SOLN
1.0000 [IU] | SUBCUTANEOUS | Status: DC
  Administered 2019-08-27 – 2019-08-28 (×5): 2 [IU] via SUBCUTANEOUS
  Administered 2019-08-28: 1 [IU] via SUBCUTANEOUS
  Administered 2019-08-28: 3 [IU] via SUBCUTANEOUS
  Administered 2019-08-28: 2 [IU] via SUBCUTANEOUS
  Administered 2019-08-28: 1 [IU] via SUBCUTANEOUS
  Administered 2019-08-29: 2 [IU] via SUBCUTANEOUS
  Administered 2019-08-29: 3 [IU] via SUBCUTANEOUS
  Administered 2019-08-29: 2 [IU] via SUBCUTANEOUS
  Administered 2019-08-29: 1 [IU] via SUBCUTANEOUS
  Administered 2019-08-29: 2 [IU] via SUBCUTANEOUS

## 2019-08-27 MED ORDER — METOCLOPRAMIDE HCL 5 MG/ML IJ SOLN
5.0000 mg | Freq: Three times a day (TID) | INTRAMUSCULAR | Status: DC
  Administered 2019-08-27 – 2019-08-30 (×10): 5 mg via INTRAVENOUS
  Filled 2019-08-27 (×11): qty 2

## 2019-08-27 MED ORDER — CHLORHEXIDINE GLUCONATE 0.12 % MT SOLN
15.0000 mL | Freq: Two times a day (BID) | OROMUCOSAL | Status: DC
  Administered 2019-08-27 – 2019-08-29 (×4): 15 mL via OROMUCOSAL
  Filled 2019-08-27 (×3): qty 15

## 2019-08-27 MED ORDER — ALBUMIN HUMAN 5 % IV SOLN
12.5000 g | Freq: Once | INTRAVENOUS | Status: AC
  Administered 2019-08-27: 12.5 g via INTRAVENOUS
  Filled 2019-08-27: qty 250

## 2019-08-27 MED ORDER — FUROSEMIDE 10 MG/ML IJ SOLN
6.0000 mg/h | INTRAVENOUS | Status: DC
  Administered 2019-08-27 – 2019-08-29 (×2): 6 mg/h via INTRAVENOUS
  Filled 2019-08-27: qty 25
  Filled 2019-08-27: qty 21

## 2019-08-27 MED ORDER — FUROSEMIDE 10 MG/ML IJ SOLN
80.0000 mg | Freq: Once | INTRAMUSCULAR | Status: AC
  Administered 2019-08-27: 80 mg via INTRAVENOUS
  Filled 2019-08-27: qty 8

## 2019-08-27 MED ORDER — ASPIRIN EC 81 MG PO TBEC
81.0000 mg | DELAYED_RELEASE_TABLET | Freq: Every day | ORAL | Status: DC
  Administered 2019-08-27 – 2019-08-31 (×5): 81 mg via ORAL
  Filled 2019-08-27 (×5): qty 1

## 2019-08-27 NOTE — Progress Notes (Signed)
OT Cancellation Note  Patient Details Name: John Dodson MRN: [id number] DOB: December 22, 1960   Cancelled Treatment:    Reason Eval/Treat Not Completed: Medical issues which prohibited therapy(Respiratory difficulties; on BiPap, Nsg requests to hold)  [place], OT/L   Acute OT Clinical Specialist Acute Rehabilitation Services Pager [phone] Office [phone]  08/27/2019, 10:17 AM

## 2019-08-27 NOTE — Progress Notes (Signed)
Occupational Therapy Evaluation  PTA, pt lived independently at home with his wife, however pt had Covid in January then B PE and wife states he had not fully recovered from his illness. Pt able to progress OOB to chair with desat into low 80s on 10L with apparent SOB. O2 increased to 12L. SpO2 gradually increased to 90 with decreased WOB. Pt currently requires Min A +2 with limited mobility and Max A with ADL tasks. Anticipate pt will make good progress, however may need follow up given his poor activity tolerance prior to surgery. Will continue to follow acutely  and assess needs for DC.     08/27/19 1716  OT Visit Information  Last OT Received On 08/27/19  Assistance Needed +2  PT/OT/SLP Co-Evaluation/Treatment Yes  Reason for Co-Treatment Complexity of the patient's impairments (multi-system involvement);For patient/therapist safety;To address functional/ADL transfers  OT goals addressed during session ADL's and self-care  History of Present Illness Patient is a 59 y/o male with PMH DM, CAD, CKD, CHB s/p PPM, NSTEMI, Basal cell CA, peripheral neuropathy, Covid infection 12/20, bilat PE 1/21, and h/o coronary stenting presented with CP.  Cath revealed stent stenoses pt now s/p CABG x 4 08/25/19.  Precautions  Precautions Sternal;Fall  Precaution Comments chest tubes; NG; pacing wires; foley  Restrictions  Weight Bearing Restrictions  (sternal precautions)  Other Position/Activity Restrictions sternal precautions  Home Living  Family/patient expects to be discharged to: Private residence  Living Arrangements Spouse/significant other  Available Help at Discharge Family  Type of [place] to enter  Entrance Stairs-Number of Steps 2  [place] One level  Engineer, manufacturing systems Yes  How Accessible Accessible via walker  [place] seat - built in;Grab bars - tub/shower  Prior Function  Level  of Independence Independent  Comments pt on disability but independent with self care  Communication  Communication No difficulties  Pain Assessment  Pain Assessment Faces  Faces Pain Scale 2  Pain Location generalized  Pain Descriptors / Indicators Grimacing;Guarding  Pain Intervention(s) Limited activity within patient's tolerance  Cognition  Arousal/Alertness Awake/alert  Behavior During Therapy Anxious  General Comments slow processing; will further assess  Upper Extremity Assessment  Upper Extremity Assessment Generalized weakness;RUE deficits/detail  RUE Deficits / Details R shoulder deficits at baseline. Per wife she appears to be talking about RTC deficits; will further assess  ADL  Overall ADL's  Needs assistance/impaired  Grooming Minimal assistance  Upper Body Bathing Moderate assistance;Sitting  Lower Body Bathing Maximal assistance;Sit to/from stand  Upper Body Dressing  Maximal assistance;Sitting  Lower Body Dressing Maximal assistance;Sit to/from Retail buyer Minimal assistance;+2 for physical assistance;Stand-pivot  Toileting- Clothing Manipulation and Hygiene Total assistance  Functional mobility during ADLs Minimal assistance;+2 for physical assistance  Bed Mobility  Overal bed mobility Needs Assistance  Bed Mobility Supine to Sit  Supine to sit +2 for physical assistance;Min assist  General bed mobility comments Assist to elevate trunk into sitting and bring hips to EOB  Transfers  Overall transfer level Needs assistance  Equipment used 2 person hand held assist (bilateral elbow support)  Transfers Sit to/from [facility]  Sit to [employer] physical assistance  Stand pivot transfers Min assist;+2 physical assistance  General transfer comment Assist to bring hips up and for balance. Nurse present to assist with line managment. Pt with pivotal steps bed to recliner. Pt holding heart pillow to chest.  Balance  Overall balance  assessment Needs assistance  Sitting-balance support No upper extremity supported;Feet supported  Sitting balance-Leahy Scale Fair  Standing balance support No upper extremity supported  Standing balance-Leahy Scale Fair  Standing balance comment min guard assist due to lines/safety  General Comments  General comments (skin integrity, edema, etc.) Pt on 10L of O2 with SpO2 in low 90's prior to transfer. After transfer to chair pt with SpO2 to 84-88%. After up in recliner for ~10 minutes SpO2 88-90%  Exercises  Exercises Other exercises  Other Exercises  Other Exercises encouraged use of incentive spirometer  OT - End of Session  Equipment Utilized During Treatment Oxygen (12 L for mobility)  Activity Tolerance Patient tolerated treatment well;Patient limited by fatigue  Patient left in chair;with call bell/phone within reach;with family/visitor present  Nurse Communication Mobility status  OT Assessment  OT Recommendation/Assessment Patient needs continued OT Services  OT Visit Diagnosis Unsteadiness on feet (R26.81);Muscle weakness (generalized) (M62.81);Pain  Pain - part of body  (generalized)  OT Problem List Decreased strength;Decreased range of motion;Decreased activity tolerance;Impaired balance (sitting and/or standing);Decreased knowledge of use of DME or AE;Cardiopulmonary status limiting activity;Obesity;Pain  OT Plan  OT Frequency (ACUTE ONLY) Min 2X/week  OT Treatment/Interventions (ACUTE ONLY) Self-care/ADL training;Therapeutic exercise;Energy conservation;DME and/or AE instruction;Therapeutic activities;Patient/family education;Balance training  AM-PAC OT "6 Clicks" Daily Activity Outcome Measure (Version 2)  Help from another person eating meals? 3  Help from another person taking care of personal grooming? 3  Help from another person toileting, which includes using toliet, bedpan, or urinal? 1  Help from another person bathing (including washing, rinsing, drying)? 2   Help from another person to put on and taking off regular upper body clothing? 2  Help from another person to put on and taking off regular lower body clothing? 2  6 Click Score 13  OT Recommendation  Follow Up Recommendations Supervision/Assistance - 24 hour (pending progress)  OT Equipment 3 in 1 bedside commode  Individuals Consulted  Consulted and Agree with Results and Recommendations Patient;Family member/caregiver  Family Member Consulted wife  Acute Rehab OT Goals  Patient Stated Goal to get better  OT Goal Formulation With patient  Time For Goal Achievement 09/10/19  Potential to Achieve Goals Good  OT Time Calculation  OT Start Time (ACUTE ONLY) 1613  OT Stop Time (ACUTE ONLY) 1650  OT Time Calculation (min) 37 min  OT General Charges  $OT Visit 1 Visit  OT Evaluation  $OT Eval High Complexity 1 High  Written Expression  Dominant Hand Right  Maurie Boettcher, OT/L   Acute OT Clinical Specialist [place] Pager [phone] Office [phone]

## 2019-08-27 NOTE — Progress Notes (Signed)
2 Days Post-Op Procedure(s) (LRB): CORONARY ARTERY BYPASS GRAFTING (CABG) TIMES FOUR, ON PUMP, USING LEFT AND RIGHT INTERNAL MAMMARY ARTERY AND LEFT RADIAL ARTERY (N/A) RADIAL ARTERY HARVEST (Left) TRANSESOPHAGEAL ECHOCARDIOGRAM (TEE) (N/A) INDOCYANINE GREEN FLUORESCENCE IMAGING (ICG) (N/A) Subjective: Shortness of breath  Objective: Vital signs in last 24 hours: Temp:  [97.7 F (36.5 C)-99.1 F (37.3 C)] 97.8 F (36.6 C) (04/30 0300) Pulse Rate:  [90-107] 92 (04/30 0700) Cardiac Rhythm: Atrial paced (04/30 0000) Resp:  [0-38] 25 (04/30 0700) BP: (104-162)/(66-123) 104/82 (04/30 0615) SpO2:  [77 %-100 %] 90 % (04/30 0700) Arterial Line BP: (98-180)/(53-79) 136/59 (04/30 0700) FiO2 (%):  [100 %] 100 % (04/30 0352) Weight:  [102.4 kg] 102.4 kg (04/30 0615)  Hemodynamic parameters for last 24 hours: PAP: (42)/(24) 42/24 CO:  [4 L/min] 4 L/min CI:  [1.9 L/min/m2] 1.9 L/min/m2  Intake/Output from previous day: 04/29 0701 - 04/30 0700 In: 1268.1 [I.V.:768; IV Piggyback:500.1] Out: 1950 [Urine:1290; Chest Tube:660] Intake/Output this shift: No intake/output data recorded.  General appearance: alert and cooperative Neurologic: intact Heart: regular rate and rhythm, S1, S2 normal, no murmur, click, rub or gallop Lungs: diminished breath sounds bilaterally Abdomen: soft, non-tender; bowel sounds normal; no masses,  no organomegaly Extremities: edema 2+ Wound: dressed  Lab Results: Recent Labs    08/26/19 1600 08/26/19 2212 08/27/19 0352 08/27/19 0446  WBC 21.8*  --   --  22.9*  HGB 9.9*   < > 10.2* 9.9*  HCT 31.9*   < > 30.0* 31.7*  PLT 157  --   --  165   < > = values in this interval not displayed.   BMET:  Recent Labs    08/26/19 1600 08/26/19 2212 08/27/19 0352 08/27/19 0446  NA 136   < > 139 138  K 5.0   < > 4.9 4.8  CL 107  --   --  107  CO2 20*  --   --  20*  GLUCOSE 138*  --   --  141*  BUN 26*  --   --  31*  CREATININE 1.52*  --   --  1.84*   CALCIUM 8.1*  --   --  8.3*   < > = values in this interval not displayed.    PT/INR:  Recent Labs    08/25/19 1508  LABPROT 17.3*  INR 1.5*   ABG    Component Value Date/Time   PHART 7.312 (L) 08/27/2019 0352   HCO3 19.8 (L) 08/27/2019 0352   TCO2 21 (L) 08/27/2019 0352   ACIDBASEDEF 6.0 (H) 08/27/2019 0352   O2SAT 82.0 08/27/2019 0352   CBG (last 3)  Recent Labs    08/27/19 0332 08/27/19 0451 08/27/19 0700  GLUCAP 122* 133* 146*    Assessment/Plan: S/P Procedure(s) (LRB): CORONARY ARTERY BYPASS GRAFTING (CABG) TIMES FOUR, ON PUMP, USING LEFT AND RIGHT INTERNAL MAMMARY ARTERY AND LEFT RADIAL ARTERY (N/A) RADIAL ARTERY HARVEST (Left) TRANSESOPHAGEAL ECHOCARDIOGRAM (TEE) (N/A) INDOCYANINE GREEN FLUORESCENCE IMAGING (ICG) (N/A) Mobilize Diuresis NGT  Check echo    LOS: 7 days    [name] 08/27/2019

## 2019-08-27 NOTE — Progress Notes (Signed)
  Echocardiogram 2D Echocardiogram has been performed.  John Dodson 08/27/2019, 8:01 AM

## 2019-08-27 NOTE — Plan of Care (Signed)
Tenuous respiratory status during shift. Difficulty controlling pain at incision/chest tube sites. Repeated education on importance of TCDB/IS, treated with q 2 hr morphine injections, repositioning and encouragement.   Decr sats despite encouraging pulmonary toilet, did not respond to NRB. Dr Roxy Manns paged, Chest Xray, ABG showing hypoxemia, placed on BiPAP with improvement in sats/ABG for several hours then desatted again with correlating decr p02 on repeat ABG. Hypertensive requiring lopressor 5 mg IV twice and Cardene gtt (now weaned off). Borderline urine output.   Dr Roxy Manns paged, lasix 80 mg IV given, urine output of 285 ml in 2.5 hr after lasix. Sats 88-93%.   Pt remains alert and oriented.    Problem: Education: Goal: Knowledge of General Education information will improve Description: Including pain rating scale, medication(s)/side effects and non-pharmacologic comfort measures Outcome: Progressing   Problem: Clinical Measurements: Goal: Will remain free from infection Outcome: Progressing Goal: Diagnostic test results will improve Outcome: Progressing Goal: Cardiovascular complication will be avoided Outcome: Progressing   Problem: Elimination: Goal: Will not experience complications related to urinary retention Outcome: Progressing   Problem: Safety: Goal: Ability to remain free from injury will improve Outcome: Progressing   Problem: Skin Integrity: Goal: Risk for impaired skin integrity will decrease Outcome: Progressing

## 2019-08-27 NOTE — Discharge Summary (Signed)
Physician Discharge Summary  Patient ID: John Dodson MRN: [id number] DOB/AGE: 1960/12/24 59 y.o.  Admit date: 08/20/2019 Discharge date: 08/31/2019  Admission Diagnoses:  Patient Active Problem List   Diagnosis Date Noted  . Coronary artery disease 08/25/2019  . Coronary artery disease involving native coronary artery with unstable angina pectoris ([place]) 08/20/2019  . Progressive angina ([place]) 08/12/2019  . Coronary artery disease involving native coronary artery of native heart with angina pectoris ([place]) 07/27/2019  . Chest pain with moderate risk for cardiac etiology 07/27/2019  . Type 2 diabetes mellitus with complication, with long-term current use of insulin ([place])   . Pulmonary embolism on long-term anticoagulation therapy ([place]) 05/18/2019  . OSA treated with BiPAP 02/2019  . CAD S/P PCI 12/28/2018  . History of complete heart block 06/2016  . Hyperlipidemia with target LDL less than 70 07/26/2013  . Essential hypertension 07/26/2013   Discharge Diagnoses:  Patient Active Problem List   Diagnosis Date Noted  . 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 Coronary artery disease 08/25/2019  . Coronary artery disease involving native coronary artery with unstable angina pectoris ([place]) 08/20/2019  . Progressive angina ([place]) 08/12/2019  . Coronary artery disease involving native coronary artery of native heart with angina pectoris ([place]) 07/27/2019  . Chest pain with moderate risk for cardiac etiology 07/27/2019  . Type 2 diabetes mellitus with complication, with long-term current use of insulin ([place])   . Pulmonary embolism on long-term anticoagulation therapy ([place]) 05/18/2019  . OSA treated with BiPAP 02/2019  . CAD S/P PCI 12/28/2018  . History of complete heart block 06/2016  . Hyperlipidemia with target LDL less than 70 07/26/2013  . Essential hypertension 07/26/2013   Discharged Condition: good  History of Present Illness:   John Dodson is a 59 yo male with known history of CKD  Stage 3, HTN, H/O CHB S/P PPM, Hyperlipidemia, DM, and H/O CAD S/p PCI.  The patient was in his usual state of health until last week.  He developed resting chest pain that was sharp and radiated into his bilateral upper extremities and his jaw.  The patient took SL NTG which provided some relief.  He presented for evaluation with Dr. Ellyn Hack who felt he should undergo cardiac catheterization.  This was performed on 08/20/2019 and showed multivessel CAD.  It was felt coronary bypass grafting would be indicated, he was admitted, and TCTS consult was obtained.     Hospital course:  The patient remained chest pain free.  He was evaluated by Dr. Orvan Seen who was in agreement the patient would benefit from coronary bypass grafting procedure.  The risks and benefits of the procedure were explained to the patient and he was agreeable to proceed.  He was taken to the operating room and underwent CABG x 4 LIMA to LAD, sequential radial artery to OM 2 and PDA, and RIMA to Ramus.  He underwent open harvest of left radial artery.  The patient tolerated the procedure without difficulty and was taken to the SICU in stable condition. The patient was extubated the evening of surgery.  During his stay in the SICU the patient was weaned off NTG drip as tolerated.  He was started on Isordil for radial artery graft.  His chest tubes and arterial lines were removed without difficulty.  The patient developed shortness of breath.  Echocardiogram was obtained and showed no evidence of pericardial tamponade.  He was started on lasix for volume overload state.  He remained in paced rhythm due  to his PPM.  His lopressor was titrated accordingly for additional BP control.  He was stable for transfer to the progressive care unit on 08/29/2019.  He underwent evaluation by SLP which showed no evidence of aspiration.  He was kept on a heart healthy diet.  His pacing wires were removed without difficulty.  He was restarted on his home regimen of  Cozaar for HTN.  He was restarted on coumadin at discharge for history of PE.  He was weaned off oxygen as tolerated.  His surgical incisions are healing without evidence of infection.  His radial artery site is free of numbness/tingling.  He is medically stable for discharge home today.   Significant Diagnostic Studies: angiography:    The left ventricular systolic function is normal. The left ventricular ejection fraction is 55-65% by visual estimate. LV end diastolic pressure is normal.LV end diastolic pressure is normal.  -------------------  There is stent from ostial to almost distal small nondominant RCA: Ost RCA to Mid RCA stent is diffusely 35% stenosed.  Ost Cx to Prox Cx lesion is 60% stenosed.  Ost LAD to Mid LAD stent is 10% stenosed. Mid LAD to Dist LAD lesion is 60% stenosed.  Previously placed 2nd Mrg stent (DES) is widely patent.  Lat 2nd Mrg lesion is 80% stenosed. Previously described.  Previously placed 1st LPL stent (DES) is widely patent.  LPAV lesion is 50% stenosed.  LPDA stent is 5% stenosed.    Multivessel CAD with essentially patent stents in proximal to mid LAD, proximal OM1, proximal LPL1, several stents in the L PDA as well as RCA that is a small nondominant vessel. No obvious lesions within stents noted.  Ostial LCx roughly 60%-->highly DFR positive with 0.84  Long mid LAD 60% after stent--highly DFR +0.67-0.73  Normal LVEDP  With significant LAD as well as ostial LCx disease--best course of action is CVTS consultation for CABG  Treatments: surgery:    Coronary Artery Bypass Grafting x 4             Left Internal Mammary Artery to Distal Left Anterior Descending Coronary Artery; left radial artery graft to left posterior Descending Coronary Artery mid marginal Branch of Left Circumflex Coronary Artery is a sequenced graft, pedicled right internal mammary graft to ramus intermedius coronary Artery Bilateral internal mammary artery  harvesting Open Left radial artery harvesting multilevel level rib block Completion graft surveillance with indocyanine green fluorescence angiography (SPY)  Discharge Exam: Blood pressure (!) 149/74, pulse (!) 103, temperature 98.6 F (37 C), temperature source Oral, resp. rate (!) 23, height 5\' 8"  (1.727 m), weight 94.4 kg, SpO2 97 %.  General appearance: alert, cooperative and no distress Heart: regular rate and rhythm and paced Lungs: clear to auscultation bilaterally Abdomen: soft, non-tender; bowel sounds normal; no masses,  no organomegaly Extremities: extremities normal, atraumatic, no cyanosis or edema Wound: clean and dry   Discharge Medications:  The patient has been discharged on:   1.Beta Blocker:  Yes [ X  ]                              No   [   ]                              If No, reason:  2.Ace Inhibitor/ARB: Yes [   ]  No  [  X  ]                                     If No, reason:  3.Statin:   Yes [ x  ]                  No  [   ]                  If No, reason:  4.Ecasa:  Yes  Valu.[place]   ]                  No   [   ]                  If No, reason:     Allergies as of 08/31/2019      Reactions   Latex Rash   Codeine Nausea And Vomiting   Contrast Media [iodinated Diagnostic Agents]    Reportedly cardiac arrest   Integrilin [eptifibatide]    Reportedly had shortness of breath, confusion.   Tylenol [acetaminophen]    Tylenol -3 with codiene   Zithromax [azithromycin] Nausea And Vomiting   Glipizide Rash   Headache      Medication List    STOP taking these medications   enoxaparin 100 MG/ML injection Commonly known as: LOVENOX   metoprolol succinate 100 MG 24 hr tablet Commonly known as: TOPROL-XL   ranolazine 1000 MG SR tablet Commonly known as: RANEXA     TAKE these medications   albuterol 108 (90 Base) MCG/ACT inhaler Commonly known as: VENTOLIN HFA Inhale 2 puffs into the lungs every 4 (four) hours  as needed for wheezing or shortness of breath.   aspirin 81 MG EC tablet Take 1 tablet (81 mg total) by mouth daily.   atorvastatin 40 MG tablet Commonly known as: LIPITOR Take 40 mg by mouth daily.   calcium-vitamin D 500-200 MG-UNIT tablet Commonly known as: OSCAL WITH D Take 1 tablet by mouth daily. 50 MCG (2000UNIT ) TAB CHOLECALCIFEROL   clopidogrel 75 MG tablet Commonly known as: PLAVIX Take 75 mg by mouth daily.   colchicine 0.6 MG tablet Take 0.5 tablets (0.3 mg total) by mouth 2 (two) times daily.   diphenhydrAMINE 50 MG tablet Commonly known as: BENADRYL Take 50 mg by mouth once.   Fish Oil Burp-Less 1000 MG Caps Take 1,000 mg by mouth 2 (two) times daily.   fluorouracil 5 % cream Commonly known as: EFUDEX Apply 1 application topically 2 (two) times daily as needed (irritation).   hydrocortisone 2.5 % lotion Apply 1 application topically 2 (two) times daily as needed (irritation).   isosorbide dinitrate 10 MG tablet Commonly known as: ISORDIL Take 1 tablet (10 mg total) by mouth 3 (three) times daily.   Lantus SoloStar 100 UNIT/ML Solostar Pen Generic drug: insulin glargine Inject 175 Units into the skin every evening. 100UNITS/3 ML   losartan 50 MG tablet Commonly known as: COZAAR Take 50 mg by mouth daily.   MAGNESIUM OXIDE PO Take 500 mg by mouth daily. TAKE 1  TABLET BY MOUTH NIGHTLY FOR LEG CRAMPS   metFORMIN 500 MG tablet Commonly known as: GLUCOPHAGE Take 500 mg by mouth 2 (two) times daily with a meal.   metoprolol tartrate 25 MG tablet Commonly known as: LOPRESSOR Take 1 tablet (25 mg total) by mouth 2 (two)  times daily.   Nitrostat 0.4 MG SL tablet Generic drug: nitroGLYCERIN Place 0.4 mg under the tongue every 5 (five) minutes as needed for chest pain.   pantoprazole 40 MG tablet Commonly known as: PROTONIX Take 40 mg by mouth 2 (two) times daily before a meal.   predniSONE 50 MG tablet Commonly known as: DELTASONE Take 50 mg  tablet at 8 pm night before and take 50 mg tablet at 2 am the morning of procedure and before you leave home take 50 mg tablet with 50 mg of Benadryl   traMADol 50 MG tablet Commonly known as: ULTRAM Place 1 tablet (50 mg total) into feeding tube every 4 (four) hours as needed for moderate pain.   triamcinolone cream 0.1 % Commonly known as: KENALOG Apply 1 application topically 2 (two) times daily as needed (irritation).   Voltaren 1 % Gel Generic drug: diclofenac Sodium Apply 2 g topically daily as needed (pain).   warfarin 5 MG tablet Commonly known as: COUMADIN Take 5-7.5 mg by mouth See admin instructions. 5 mg on Tues, 7.5 mg all other days      Follow-up Information    [name], MD Follow up on 09/13/2019.   Specialty: Cardiothoracic Surgery Why: Appointment is at 12:00 Contact information: [address] [place] [address] [phone]           Signed: Ellwood Handler, PA-C  08/31/2019, 7:21 AM

## 2019-08-27 NOTE — Progress Notes (Signed)
Physical Therapy Treatment Patient Details Name: John Dodson MRN: [id number] DOB: July 19, 1960 Today's Date: 08/27/2019    History of Present Illness Patient is a 59 y/o male with PMH DM, CAD, CKD, CHB s/p PPM, NSTEMI, Basal cell CA, peripheral neuropathy, Covid infection 12/20, bilat PE 1/21, and h/o coronary stenting presented with CP.  Cath revealed stent stenoses pt now s/p CABG x 4 08/25/19.    PT Comments    Pt slow to progress due to tenuous respiratory status. As medical status improves expect pt's mobility to increase.    Follow Up Recommendations  No PT follow up     Equipment Recommendations  Other (comment)(TBA)    Recommendations for Other Services       Precautions / Restrictions Precautions Precautions: Sternal;Fall Restrictions Weight Bearing Restrictions: (sternal precautions)    Mobility  Bed Mobility Overal bed mobility: Needs Assistance Bed Mobility: Supine to Sit     Supine to sit: +2 for physical assistance;Min assist     General bed mobility comments: Assist to elevate trunk into sitting and bring hips to EOB  Transfers Overall transfer level: Needs assistance Equipment used: 2 person hand held assist(bilateral elbow support) Transfers: Sit to/from [facility] Sit to Stand: Min assist;+2 physical assistance Stand pivot transfers: Min assist;+2 physical assistance       General transfer comment: Assist to bring hips up and for balance. Nurse present to assist with line managment. Pt with pivotal steps bed to recliner. Pt holding heart pillow to chest.  Ambulation/Gait                 Stairs             Wheelchair Mobility    Modified Rankin (Stroke Patients Only)       Balance Overall balance assessment: Needs assistance Sitting-balance support: No upper extremity supported;Feet supported Sitting balance-Leahy Scale: Fair     Standing balance support: No upper extremity supported Standing  balance-Leahy Scale: Fair Standing balance comment: min guard assist due to lines/safety                            Cognition Arousal/Alertness: Awake/alert Behavior During Therapy: Anxious Overall Cognitive Status: Within Functional Limits for tasks assessed                                        Exercises      General Comments General comments (skin integrity, edema, etc.): Pt on 10L of O2 with SpO2 in low 90's prior to transfer. After transfer to chair pt with SpO2 to 84-88%. After up in recliner for ~10 minutes SpO2 88-90%      Pertinent Vitals/Pain Pain Assessment: Faces Faces Pain Scale: Hurts a little bit Pain Location: generalized Pain Descriptors / Indicators: Grimacing;Guarding Pain Intervention(s): Limited activity within patient's tolerance;Monitored during session    Home Living                      Prior Function            PT Goals (current goals can now be found in the care plan section) Progress towards PT goals: Progressing toward goals(limited by respiratory status)    Frequency    Min 3X/week      PT Plan Current plan remains appropriate    Co-evaluation  AM-PAC PT "6 Clicks" Mobility   Outcome Measure  Help needed turning from your back to your side while in a flat bed without using bedrails?: A Little Help needed moving from lying on your back to sitting on the side of a flat bed without using bedrails?: A Little Help needed moving to and from a bed to a chair (including a wheelchair)?: A Little Help needed standing up from a chair using your arms (e.g., wheelchair or bedside chair)?: A Little Help needed to walk in hospital room?: Total Help needed climbing 3-5 steps with a railing? : Total 6 Click Score: 14    End of Session Equipment Utilized During Treatment: Oxygen Activity Tolerance: Patient limited by fatigue;Treatment limited secondary to medical complications  (Comment)(respiratory status) Patient left: in chair;with call bell/phone within reach;with nursing/sitter in room;with family/visitor present Nurse Communication: Mobility status(nurse present for transfer) PT Visit Diagnosis: Difficulty in walking, not elsewhere classified (R26.2);Muscle weakness (generalized) (M62.81)     Time: [id number] PT Time Calculation (min) (ACUTE ONLY): 20 min  Charges:  $Therapeutic Activity: 8-22 mins                     [place] Pager [phone] Office [place] 08/27/2019, 5:08 PM

## 2019-08-27 NOTE — Progress Notes (Signed)
PT Cancellation Note  Patient Details Name: [name] MRN: [id number] DOB: Sep 03, 1960   Cancelled Treatment:    Reason Eval/Treat Not Completed: Medical issues which prohibited therapy. Pt with respiratory issues and on bipap.    [place] 08/27/2019, 11:18 AM [place] Pager [phone] Office [phone]

## 2019-08-28 ENCOUNTER — Inpatient Hospital Stay (HOSPITAL_COMMUNITY): Payer: Medicare Other

## 2019-08-28 LAB — GLUCOSE, CAPILLARY
Glucose-Capillary: 158 mg/dL — ABNORMAL HIGH (ref 70–99)
Glucose-Capillary: 188 mg/dL — ABNORMAL HIGH (ref 70–99)
Glucose-Capillary: 215 mg/dL — ABNORMAL HIGH (ref 70–99)
Glucose-Capillary: 174 mg/dL — ABNORMAL HIGH (ref 70–99)
Glucose-Capillary: 144 mg/dL — ABNORMAL HIGH (ref 70–99)
Glucose-Capillary: 103 mg/dL — ABNORMAL HIGH (ref 70–99)

## 2019-08-28 LAB — BASIC METABOLIC PANEL
Anion gap: 11 (ref 5–15)
BUN: 35 mg/dL — ABNORMAL HIGH (ref 6–20)
CO2: 28 mmol/L (ref 22–32)
Calcium: 8.5 mg/dL — ABNORMAL LOW (ref 8.9–10.3)
Chloride: 104 mmol/L (ref 98–111)
Creatinine, Ser: 1.84 mg/dL — ABNORMAL HIGH (ref 0.61–1.24)
GFR calc Af Amer: 46 mL/min — ABNORMAL LOW (ref >60)
GFR calc non Af Amer: 40 mL/min — ABNORMAL LOW (ref >60)
Glucose, Bld: 177 mg/dL — ABNORMAL HIGH (ref 70–99)
Potassium: 3.5 mmol/L (ref 3.5–5.1)
Sodium: 143 mmol/L (ref 135–145)

## 2019-08-28 LAB — CBC
HCT: 30.1 % — ABNORMAL LOW (ref 39.0–52.0)
Hemoglobin: 9.5 g/dL — ABNORMAL LOW (ref 13.0–17.0)
MCH: 27.5 pg (ref 26.0–34.0)
MCHC: 31.6 g/dL (ref 30.0–36.0)
MCV: 87.2 fL (ref 80.0–100.0)
Platelets: 129 10*3/uL — ABNORMAL LOW (ref 150–400)
RBC: 3.45 MIL/uL — ABNORMAL LOW (ref 4.22–5.81)
RDW: 15.5 % (ref 11.5–15.5)
WBC: 11.5 10*3/uL — ABNORMAL HIGH (ref 4.0–10.5)
nRBC: 0 % (ref 0.0–0.2)

## 2019-08-28 MED ORDER — METOPROLOL TARTRATE 25 MG PO TABS
25.0000 mg | ORAL_TABLET | Freq: Two times a day (BID) | ORAL | Status: DC
  Administered 2019-08-28 – 2019-08-31 (×6): 25 mg via ORAL
  Filled 2019-08-28 (×6): qty 1

## 2019-08-28 MED ORDER — METOPROLOL TARTRATE 25 MG/10 ML ORAL SUSPENSION
25.0000 mg | Freq: Two times a day (BID) | ORAL | Status: DC
  Filled 2019-08-28 (×3): qty 10

## 2019-08-28 MED ORDER — TRAMADOL HCL 50 MG PO TABS
50.0000 mg | ORAL_TABLET | ORAL | Status: DC | PRN
  Administered 2019-08-31 (×2): 50 mg
  Filled 2019-08-28 (×2): qty 1

## 2019-08-28 MED ORDER — POTASSIUM CHLORIDE 20 MEQ/15ML (10%) PO SOLN
20.0000 meq | ORAL | Status: AC
  Administered 2019-08-28 (×3): 20 meq
  Filled 2019-08-28 (×3): qty 15

## 2019-08-28 NOTE — Plan of Care (Signed)
Stable during shift. Slept well, Assist with repositioning in bed. BP better controlled, only required metoprolol 5 mg IV x 2 throughout shift.   Sats stable, able to wean HFNC down to 6 lpm, continue to encourage pulmonary toilet, minimal volumes on IS.   Lasix gtt, great diuresis with approx 4.5 L out in 24 hr.   Remains NPO, NGT to ILWS with small amount of output.   OOB to recliner this am. Gait steady, slight incr HR with exertion.   Problem: Education: Goal: Knowledge of General Education information will improve Description: Including pain rating scale, medication(s)/side effects and non-pharmacologic comfort measures Outcome: Progressing   Problem: Health Behavior/Discharge Planning: Goal: Ability to manage health-related needs will improve Outcome: Progressing   Problem: Clinical Measurements: Goal: Ability to maintain clinical measurements within normal limits will improve Outcome: Progressing Goal: Will remain free from infection Outcome: Progressing Goal: Respiratory complications will improve Outcome: Progressing Goal: Cardiovascular complication will be avoided Outcome: Progressing   Problem: Activity: Goal: Risk for activity intolerance will decrease Outcome: Progressing   Problem: Coping: Goal: Level of anxiety will decrease Outcome: Progressing   Problem: Elimination: Goal: Will not experience complications related to urinary retention Outcome: Progressing   Problem: Pain Managment: Goal: General experience of comfort will improve Outcome: Progressing   Problem: Safety: Goal: Ability to remain free from injury will improve Outcome: Progressing   Problem: Skin Integrity: Goal: Risk for impaired skin integrity will decrease Outcome: Progressing

## 2019-08-28 NOTE — Progress Notes (Signed)
3 Days Post-Op Procedure(s) (LRB): CORONARY ARTERY BYPASS GRAFTING (CABG) TIMES FOUR, ON PUMP, USING LEFT AND RIGHT INTERNAL MAMMARY ARTERY AND LEFT RADIAL ARTERY (N/A) RADIAL ARTERY HARVEST (Left) TRANSESOPHAGEAL ECHOCARDIOGRAM (TEE) (N/A) INDOCYANINE GREEN FLUORESCENCE IMAGING (ICG) (N/A) Subjective: Feeling better Objective: Vital signs in last 24 hours: Temp:  [98.2 F (36.8 C)-100.9 F (38.3 C)] 98.2 F (36.8 C) (05/01 0738) Pulse Rate:  [77-131] 109 (05/01 0800) Cardiac Rhythm: Ventricular paced (05/01 0800) Resp:  [15-34] 17 (05/01 0800) BP: (122-157)/(63-104) 134/86 (05/01 0800) SpO2:  [90 %-99 %] 98 % (05/01 0800) Arterial Line BP: (119-168)/(45-68) 134/64 (05/01 0800) Weight:  [99.5 kg] 99.5 kg (05/01 0630)  Hemodynamic parameters for last 24 hours:    Intake/Output from previous day: 04/30 0701 - 05/01 0700 In: 752.7 [P.O.:200; I.V.:205.5; NG/GT:340; IV Piggyback:7.2] Out: 4840 [Urine:4460; Emesis/NG output:250; Chest Tube:130] Intake/Output this shift: Total I/O In: 92 [I.V.:12; NG/GT:80] Out: 235 [Urine:235]  General appearance: alert and cooperative Neurologic: intact Heart: tachycardiac Lungs: clear to auscultation bilaterally Abdomen: soft, non-tender; bowel sounds normal; no masses,  no organomegaly Extremities: edema 2+ Wound: dressed, dry  Lab Results: Recent Labs    08/27/19 0446 08/28/19 0347  WBC 22.9* 11.5*  HGB 9.9* 9.5*  HCT 31.7* 30.1*  PLT 165 129*   BMET:  Recent Labs    08/27/19 1715 08/28/19 0347  NA 139 143  K 4.0 3.5  CL 104 104  CO2 22 28  GLUCOSE 199* 177*  BUN 37* 35*  CREATININE 1.90* 1.84*  CALCIUM 8.2* 8.5*    PT/INR:  Recent Labs    08/25/19 1508  LABPROT 17.3*  INR 1.5*   ABG    Component Value Date/Time   PHART 7.312 (L) 08/27/2019 0352   HCO3 19.8 (L) 08/27/2019 0352   TCO2 21 (L) 08/27/2019 0352   ACIDBASEDEF 6.0 (H) 08/27/2019 0352   O2SAT 82.0 08/27/2019 0352   CBG (last 3)  Recent Labs   08/27/19 2347 08/28/19 0349 08/28/19 0740  GLUCAP 146* 158* 188*    Assessment/Plan: S/P Procedure(s) (LRB): CORONARY ARTERY BYPASS GRAFTING (CABG) TIMES FOUR, ON PUMP, USING LEFT AND RIGHT INTERNAL MAMMARY ARTERY AND LEFT RADIAL ARTERY (N/A) RADIAL ARTERY HARVEST (Left) TRANSESOPHAGEAL ECHOCARDIOGRAM (TEE) (N/A) INDOCYANINE GREEN FLUORESCENCE IMAGING (ICG) (N/A) Mobilize Diuresis remove NGT  Increase beta blocker   LOS: 8 days    [name] 08/28/2019

## 2019-08-28 NOTE — Progress Notes (Signed)
Duard Brady, RN aware of D/C CVC order and will remove.

## 2019-08-29 LAB — CBC
HCT: 31.2 % — ABNORMAL LOW (ref 39.0–52.0)
Hemoglobin: 9.7 g/dL — ABNORMAL LOW (ref 13.0–17.0)
MCH: 27.1 pg (ref 26.0–34.0)
MCHC: 31.1 g/dL (ref 30.0–36.0)
MCV: 87.2 fL (ref 80.0–100.0)
Platelets: 146 10*3/uL — ABNORMAL LOW (ref 150–400)
RBC: 3.58 MIL/uL — ABNORMAL LOW (ref 4.22–5.81)
RDW: 15.2 % (ref 11.5–15.5)
WBC: 11.5 10*3/uL — ABNORMAL HIGH (ref 4.0–10.5)
nRBC: 0 % (ref 0.0–0.2)

## 2019-08-29 LAB — GLUCOSE, CAPILLARY
Glucose-Capillary: 121 mg/dL — ABNORMAL HIGH (ref 70–99)
Glucose-Capillary: 184 mg/dL — ABNORMAL HIGH (ref 70–99)
Glucose-Capillary: 195 mg/dL — ABNORMAL HIGH (ref 70–99)
Glucose-Capillary: 198 mg/dL — ABNORMAL HIGH (ref 70–99)
Glucose-Capillary: 229 mg/dL — ABNORMAL HIGH (ref 70–99)
Glucose-Capillary: 241 mg/dL — ABNORMAL HIGH (ref 70–99)
Glucose-Capillary: 76 mg/dL (ref 70–99)

## 2019-08-29 LAB — BASIC METABOLIC PANEL
Anion gap: 10 (ref 5–15)
BUN: 36 mg/dL — ABNORMAL HIGH (ref 6–20)
CO2: 32 mmol/L (ref 22–32)
Calcium: 8.5 mg/dL — ABNORMAL LOW (ref 8.9–10.3)
Chloride: 103 mmol/L (ref 98–111)
Creatinine, Ser: 1.71 mg/dL — ABNORMAL HIGH (ref 0.61–1.24)
GFR calc Af Amer: 50 mL/min — ABNORMAL LOW (ref >60)
GFR calc non Af Amer: 43 mL/min — ABNORMAL LOW (ref >60)
Glucose, Bld: 110 mg/dL — ABNORMAL HIGH (ref 70–99)
Potassium: 3.3 mmol/L — ABNORMAL LOW (ref 3.5–5.1)
Sodium: 145 mmol/L (ref 135–145)

## 2019-08-29 MED ORDER — FUROSEMIDE 40 MG PO TABS
40.0000 mg | ORAL_TABLET | Freq: Every day | ORAL | Status: DC
  Administered 2019-08-29 – 2019-08-31 (×3): 40 mg via ORAL
  Filled 2019-08-29 (×3): qty 1

## 2019-08-29 MED ORDER — SODIUM CHLORIDE 0.9% FLUSH
3.0000 mL | Freq: Two times a day (BID) | INTRAVENOUS | Status: DC
  Administered 2019-08-29 – 2019-08-31 (×4): 3 mL via INTRAVENOUS

## 2019-08-29 MED ORDER — SODIUM CHLORIDE 0.9 % IV SOLN: 250.0000 mL | INTRAVENOUS | Status: DC | PRN

## 2019-08-29 MED ORDER — ~~LOC~~ CARDIAC SURGERY, PATIENT & FAMILY EDUCATION: Freq: Once | Status: AC

## 2019-08-29 MED ORDER — POTASSIUM CHLORIDE 10 MEQ/50ML IV SOLN
10.0000 meq | INTRAVENOUS | Status: AC
  Administered 2019-08-29 (×3): 10 meq via INTRAVENOUS
  Filled 2019-08-29 (×3): qty 50

## 2019-08-29 MED ORDER — SODIUM CHLORIDE 0.9% FLUSH: 3.0000 mL | INTRAVENOUS | Status: DC | PRN

## 2019-08-29 NOTE — Evaluation (Signed)
Clinical/Bedside Swallow Evaluation Patient Details  Name: John Dodson MRN: [id number] Date of Birth: 1961/02/04  Today's Date: 08/29/2019 Time: SLP Start Time (ACUTE ONLY): 0935 SLP Stop Time (ACUTE ONLY): 0950 SLP Time Calculation (min) (ACUTE ONLY): 15 min  Past Medical History:  Past Medical History:  Diagnosis Date  . Blepharitis of both eyes    Chronic  . Cataracts, both eyes   . CKD (chronic kidney disease) stage 3, GFR 30-59 ml/min    Status post right nephrectomy/adrenalectomy and diabetic nephropathy (baseline creatinine 1.4-1.5)  . Coronary artery disease involving native coronary artery 2015   (No cath report prior to 2016 noted, but as of 2016, had stents in LAD, OM and L PDA) as documented since now in proximal LAD, OM 2-2 stents, L PDA at least 3 if not 4 stents, and also now nondominant RCA.  Marland Kitchen COVID-19 virus infection 04/25/2019  . Diabetic peripheral neuropathy associated with type 2 diabetes mellitus ([place])   . Essential hypertension   . GERD without esophagitis   . History of basal cell carcinoma (BCC) excision   . History of complete heart block 06/2016   Status post pacemaker placement  . History of non-ST elevation myocardial infarction (NSTEMI)    And several occasions of unstable angina  . Hyperlipidemia associated with type 2 diabetes mellitus [facility])    Per PCP note in February 2021, was on atorvastatin 40 mg.  . Obesity (BMI 30.0-34.9)   . Occlusion of left vertebral artery    Consider repossible acute lesion in August 2020 with presentation of headache.  CTA suggested occluded left vertebral artery throughout the neck.  Faint string-like enhancement intermittently visible suggesting recent occlusion.  Partial reconstruction and posterior fossa.  Left PICA patent.  (Consider possible left vertebral artery dissection)  . OSA treated with BiPAP 02/2019   Diagnosis in late 2020 ([facility])--> delayed onset of treatment with BiPAP due to Covid  hospitalization followed by PE. ->  BiPAP setting at 21/17 cm  . Pulmonary embolism ([place]) 05/18/2019   (1 month following COVID-19 infection): DVT-PE (bilateral PEs noted on VQ scan)-> started on warfarin.  (On warfarin plus Plavix now with aspirin stopped.)  . Type 2 diabetes mellitus with complication, with long-term current use of insulin (HCC)    On Lantus, Jardiance, Metformin & Onglyza   Past Surgical History:  Past Surgical History:  Procedure Laterality Date  . BASAL CELL CARCINOMA EXCISION  01/2015  . CAROTID DUPLEX SCAN  12/25/2018   [facility]: Mild plaque in both carotids.  139% bilateral.  Right vertebral flow normal antegrade, Left not seen; normal bilateral subclavian flow..  . CHOLECYSTECTOMY    . CORONARY ARTERY BYPASS GRAFT N/A 08/25/2019   Procedure: CORONARY ARTERY BYPASS GRAFTING (CABG) TIMES FOUR, ON PUMP, USING LEFT AND RIGHT INTERNAL MAMMARY ARTERY AND LEFT RADIAL ARTERY;  Surgeon: [name], MD;  Location: [place];  Service: Open Heart Surgery;  Laterality: N/A;  . CORONARY STENT INTERVENTION  04/10/2015   ([place]; Bishop Limbo, DO) -> DES PCI mLPDA: Xience Alpine DES 2.5 mm x 18 mm, Xience Alpine DES 2.25 mm x 12 mm overlapping.  . CORONARY STENT INTERVENTION  04/04/2016   ([place]; Bar Nunn, MD)--> (urgent) 100% mLPDA PTCA with 2.25 mm balloon - reduced to 50%; mid OM2 90% - DES PCI (Xience DES  2.5 x 18).   . CORONARY STENT INTERVENTION  2015   Prior to December 2016, STENTS noted in prox LAD,  proximal OM1, and at least 2 stents in proximal L PDA  . CORONARY STENT INTERVENTION  11/17/2018   ([place]; Bishop Limbo, DO): CULPRIT: mid 90% (DES PCI) -Resolute Onyx DES 2.5 mm x 30 mm --> COMPLICATION: Large left arm hematoma-evaluated by vascular and orthopedic surgery, managed with splint and arm elevation.  . CORONARY STENT INTERVENTION  12/28/2018   ([place]; Clarene Critchley, MD, referred by  Dr. Mathis Bud) --> INDICATION (urgent, Unstable Angina): Moderate-severe (70%) stenosis of ostial RCA  -> DES PCI Resolute Onyx DES 2.5 mm x 12 mm.   Marland Kitchen LEFT HEART CATH AND CORONARY ANGIOGRAPHY  04/10/2015   ([place]; Bishop Limbo, DO)--> EF 55%.  Mild inferior HK.  Coronaries-LM: Normal, p LAD STENT 30% ISR, mLAD 20% & diffuse dLAD ~20%; Dom LCx: mCx 20%, ~pOM1 STENT (noted as OM2 in other reports) patent with mid 30%, OM2 normal, prox LPDA "STENTS" patent with SEVERE mid L PDA 90-95% (DES PCI x 2 overlapping); Small-non-dominant RCA  patent   . LEFT HEART CATH AND CORONARY ANGIOGRAPHY  04/04/2016   ([place]; [place], MD)--> (urgent): EF 70%.  Previous LAD,~OM2 and proximal PDA stents patent; -> mLAD 35%, dLAD 40%; LCx-OM1 75% (short lesion, med Rx), mid OM2 90% (DES PCI), pLPDA stents patent w/ mPDA 100% (PTCA only - reduced to 50%); non-dom RCA - ost RCA 60% & mRCA 75%  . LEFT HEART CATH AND CORONARY ANGIOGRAPHY  11/17/2018   ([place]; Bishop Limbo, DO) indication: Angina.  LM normal; LAD - ost LAD ~50%, pLAD STENT ~30% ISR with dLAD ~40%; Dom LCx - ost & prox Cx 30%, OM1 STENT 20% ISR, OM2 STENT 50% distal edge, pLPDA overlapping STENTS ~20%; nonDom RCA - proxRCA 40%, mid 90% (DES PCI)  . LEFT HEART CATH AND CORONARY ANGIOGRAPHY  12/28/2018   ([place]; Clarene Critchley, MD, referred by Dr. Mathis Bud) --> INDICATION (urgent, Unstable Angina): Moderate-severe (70%) stenosis of ostial RCA (DES PCI), mRCA 30%.  Otherwise no significant change from July 2020: dLM 20%, pLAD ~40% ISR , dLAD long/diffuse ~50%; OstLCx 30%, OM1 ~15% OM2 ~30% with patent  LPDA stents/PTCA site.   Marland Kitchen LEFT HEART CATH AND CORONARY ANGIOGRAPHY N/A 08/20/2019   Procedure: LEFT HEART CATH AND CORONARY ANGIOGRAPHY;  Surgeon: Leonie Man, MD;  Location: [place] [facility];  Service: Cardiovascular;  Laterality: N/A;  . NEPHRECTOMY Right 2012   With adrenalectomy   . NM MYOVIEW LTD  08/20/2018   [facility] -> Lexiscan Myoview: EF 81%.  No reversible ischemia or infarction.  Normal wall motion.  Marland Kitchen PACEMAKER IMPLANT  06/2016   [facility] [place] ([employer]) -> according to CT of chest, leads positioned in right atrium, cardiac apex and coronary sinus (suggesting CRT-P - BiV Pacing))  . RADIAL ARTERY HARVEST Left 08/25/2019   Procedure: RADIAL ARTERY HARVEST;  Surgeon: [name], MD;  Location: [place];  Service: Open Heart Surgery;  Laterality: Left;  . TEE WITHOUT CARDIOVERSION N/A 08/25/2019   Procedure: TRANSESOPHAGEAL ECHOCARDIOGRAM (TEE);  Surgeon: [name], MD;  Location: [place];  Service: Open Heart Surgery;  Laterality: N/A;  . TRANSTHORACIC ECHOCARDIOGRAM  11/2018; 05/01/2019   ([place]) a) EF 60-65%.  Mild TR.;; b)moderate concentric LVH.  EF 55 to 60%.  No R WMA.  Normal RV size and function.  Normal atrial sizes.  Normal valves.   HPI:  Pt is a 59 y/o male  with PMH DM, CAD, CKD, CHB s/p PPM, NSTEMI, Basal cell CA, peripheral neuropathy, Covid infection 12/20, bilat PE 1/21, and h/o coronary stenting presented with CP.  Cath revealed stent stenosis s/p CABG x 4 08/25/19. Pt on clear liquids at time of eval with question of his being able to tolerate solids. ETT 4/28 for procedure   Assessment / Plan / Recommendation Clinical Impression  Pt was seen for bedside swallow evaluation with his wife present. Pt reported that after extubation he experienced burning when swallow liquid potassium which caused him to cough. However, he reported that these symptoms have since resolved. Oral mechanism exam was WNL and dentition was adequate. He tolerated all solids and liquids without signs or symptoms of oropharyngeal dysphagia. A regular texture diet with thin liquids is recommended. Further skilled SLP services are not clinically indicated at this time for swallowing.  SLP Visit Diagnosis: Dysphagia,  unspecified (R13.10)    Aspiration Risk  No limitations    Diet Recommendation Regular;Thin liquid   Liquid Administration via: Cup;Straw Medication Administration: Whole meds with liquid Supervision: Patient able to self feed    Other  Recommendations Oral Care Recommendations: Oral care BID   Follow up Recommendations None      Frequency and Duration            Prognosis        Swallow Study   General Date of Onset: 08/28/19 HPI: Pt is a 59 y/o male with PMH DM, CAD, CKD, CHB s/p PPM, NSTEMI, Basal cell CA, peripheral neuropathy, Covid infection 12/20, bilat PE 1/21, and h/o coronary stenting presented with CP.  Cath revealed stent stenosis s/p CABG x 4 08/25/19. Pt on clear liquids at time of eval with question of his being able to tolerate solids. ETT 4/28 for procedure Type of Study: Bedside Swallow Evaluation Previous Swallow Assessment: None Diet Prior to this Study: Thin liquids(clear liquids) Temperature Spikes Noted: No Respiratory Status: Nasal cannula History of Recent Intubation: Yes Length of Intubations (days): 1 days Date extubated: 08/25/19 Behavior/Cognition: Alert;Cooperative;Pleasant mood Oral Cavity Assessment: Within Functional Limits Oral Care Completed by SLP: No Oral Cavity - Dentition: Adequate natural dentition Vision: Functional for self-feeding Self-Feeding Abilities: Able to feed self Patient Positioning: Upright in bed;Postural control adequate for testing Baseline Vocal Quality: Hoarse Volitional Cough: Strong Volitional Swallow: Able to elicit    Oral/Motor/Sensory Function Overall Oral Motor/Sensory Function: Within functional limits   Ice Chips Ice chips: Within functional limits Presentation: Spoon   Thin Liquid Thin Liquid: Within functional limits Presentation: Cup;Straw    Nectar Thick Nectar Thick Liquid: Not tested   Honey Thick Honey Thick Liquid: Not tested   Puree Puree: Within functional limits Presentation: Spoon    Solid     Solid: Within functional limits Presentation: John Dodson, [place], [place] Office number [phone] Pager [phone]  John Dodson 08/29/2019,12:43 PM

## 2019-08-29 NOTE — Progress Notes (Signed)
Patient to room 4E11 from [facility]. On monitor CCMD notified. CHG bath completed. Vital signs obtained. Alert and oriented to room and call light. Call bell within reach.  Paulene Floor, RN

## 2019-08-29 NOTE — Progress Notes (Signed)
      [address]       [place], [address]             [phone]                 4 Days Post-Op Procedure(s) (LRB): CORONARY ARTERY BYPASS GRAFTING (CABG) TIMES FOUR, ON PUMP, USING LEFT AND RIGHT INTERNAL MAMMARY ARTERY AND LEFT RADIAL ARTERY (N/A) RADIAL ARTERY HARVEST (Left) TRANSESOPHAGEAL ECHOCARDIOGRAM (TEE) (N/A) INDOCYANINE GREEN FLUORESCENCE IMAGING (ICG) (N/A)   Events: No events _______________________________________________________________ Vitals: BP 131/71   Pulse (!) 113   Temp 99.3 F (37.4 C) (Oral)   Resp (!) 23   Ht 5\' 8"  (1.727 m)   Wt 94.5 kg   SpO2 100%   BMI 31.68 kg/m   - Neuro: alert NAd  - Cardiovascular: paced  Drips: lasix.      - Pulm: EWOB, clear    ABG    Component Value Date/Time   PHART 7.312 (L) 08/27/2019 0352   PCO2ART 39.2 08/27/2019 0352   PO2ART 51 (L) 08/27/2019 0352   HCO3 19.8 (L) 08/27/2019 0352   TCO2 21 (L) 08/27/2019 0352   ACIDBASEDEF 6.0 (H) 08/27/2019 0352   O2SAT 82.0 08/27/2019 0352    - Abd: soft - Extremity: warm  .Intake/Output      05/01 0701 - 05/02 0700 05/02 0701 - 05/03 0700   P.O. 660    I.V. (mL/kg) 48 (0.5)    NG/GT 80    IV Piggyback 0.3    Total Intake(mL/kg) 788.3 (8.3)    Urine (mL/kg/hr) 3765 (1.7) 260 (0.7)   Emesis/NG output     Stool 1 0   Chest Tube     Total Output 3766 260   Net -2977.7 -260        Stool Occurrence 3 x 1 x      _______________________________________________________________ Labs: CBC Latest Ref Rng & Units 08/29/2019 08/28/2019 08/27/2019  WBC 4.0 - 10.5 K/uL 11.5(H) 11.5(H) 22.9(H)  Hemoglobin 13.0 - 17.0 g/dL 9.7(L) 9.5(L) 9.9(L)  Hematocrit 39.0 - 52.0 % 31.2(L) 30.1(L) 31.7(L)  Platelets 150 - 400 K/uL 146(L) 129(L) 165   CMP Latest Ref Rng & Units 08/29/2019 08/28/2019 08/27/2019  Glucose 70 - 99 mg/dL 110(H) 177(H) 199(H)  BUN 6 - 20 mg/dL 36(H) 35(H) 37(H)  Creatinine 0.61 - 1.24 mg/dL 1.71(H) 1.84(H) 1.90(H)  Sodium 135 - 145  mmol/L 145 143 139  Potassium 3.5 - 5.1 mmol/L 3.3(L) 3.5 4.0  Chloride 98 - 111 mmol/L 103 104 104  CO2 22 - 32 mmol/L 32 28 22  Calcium 8.9 - 10.3 mg/dL 8.5(L) 8.5(L) 8.2(L)  Total Protein 6.5 - 8.1 g/dL - - -  Total Bilirubin 0.3 - 1.2 mg/dL - - -  Alkaline Phos 38 - 126 U/L - - -  AST 15 - 41 U/L - - -  ALT 0 - 44 U/L - - -    CXR: pending  _______________________________________________________________  Assessment and Plan: POD 4 s/p CABG, doing well  Neuro: pain controlled CV: PPM, A/S/BB, will increase BB Pulm: continue pulm toilet Renal: creat down.  Good uop.  Will d/c lasix gtt GI: on diet Heme: stable ID: [email or web address] Endo: SSI  Dispo: floor today  Melodie Bouillon, MD 08/29/2019 10:52 AM

## 2019-08-30 ENCOUNTER — Inpatient Hospital Stay (HOSPITAL_COMMUNITY): Payer: Medicare Other

## 2019-08-30 LAB — GLUCOSE, CAPILLARY
Glucose-Capillary: 105 mg/dL — ABNORMAL HIGH (ref 70–99)
Glucose-Capillary: 122 mg/dL — ABNORMAL HIGH (ref 70–99)
Glucose-Capillary: 231 mg/dL — ABNORMAL HIGH (ref 70–99)
Glucose-Capillary: 196 mg/dL — ABNORMAL HIGH (ref 70–99)

## 2019-08-30 LAB — BASIC METABOLIC PANEL
Anion gap: 9 (ref 5–15)
BUN: 38 mg/dL — ABNORMAL HIGH (ref 6–20)
CO2: 34 mmol/L — ABNORMAL HIGH (ref 22–32)
Calcium: 8.4 mg/dL — ABNORMAL LOW (ref 8.9–10.3)
Chloride: 97 mmol/L — ABNORMAL LOW (ref 98–111)
Creatinine, Ser: 1.87 mg/dL — ABNORMAL HIGH (ref 0.61–1.24)
GFR calc Af Amer: 45 mL/min — ABNORMAL LOW (ref >60)
GFR calc non Af Amer: 39 mL/min — ABNORMAL LOW (ref >60)
Glucose, Bld: 130 mg/dL — ABNORMAL HIGH (ref 70–99)
Potassium: 3.7 mmol/L (ref 3.5–5.1)
Sodium: 140 mmol/L (ref 135–145)

## 2019-08-30 LAB — CBC
HCT: 30.1 % — ABNORMAL LOW (ref 39.0–52.0)
Hemoglobin: 9.4 g/dL — ABNORMAL LOW (ref 13.0–17.0)
MCH: 27.1 pg (ref 26.0–34.0)
MCHC: 31.2 g/dL (ref 30.0–36.0)
MCV: 86.7 fL (ref 80.0–100.0)
Platelets: 153 10*3/uL (ref 150–400)
RBC: 3.47 MIL/uL — ABNORMAL LOW (ref 4.22–5.81)
RDW: 14.8 % (ref 11.5–15.5)
WBC: 8.8 10*3/uL (ref 4.0–10.5)
nRBC: 0 % (ref 0.0–0.2)

## 2019-08-30 MED ORDER — POTASSIUM CHLORIDE CRYS ER 20 MEQ PO TBCR
20.0000 meq | EXTENDED_RELEASE_TABLET | Freq: Every day | ORAL | Status: DC
  Administered 2019-08-30 – 2019-08-31 (×2): 20 meq via ORAL
  Filled 2019-08-30 (×3): qty 1

## 2019-08-30 MED ORDER — INSULIN ASPART 100 UNIT/ML ~~LOC~~ SOLN
1.0000 [IU] | Freq: Three times a day (TID) | SUBCUTANEOUS | Status: DC
  Administered 2019-08-30: 3 [IU] via SUBCUTANEOUS
  Administered 2019-08-30: 1 [IU] via SUBCUTANEOUS
  Administered 2019-08-30 – 2019-08-31 (×2): 2 [IU] via SUBCUTANEOUS

## 2019-08-30 NOTE — Progress Notes (Signed)
Occupational Therapy Treatment Patient Details Name: John Dodson MRN: [id number] DOB: 15-Aug-1960 Today's Date: 08/30/2019    History of present illness Patient is a 59 y/o male with PMH DM, CAD, CKD, CHB s/p PPM, NSTEMI, Basal cell CA, peripheral neuropathy, Covid infection 12/20, bilat PE 1/21, and h/o coronary stenting presented with CP.  Cath revealed stent stenoses pt now s/p CABG x 4 08/25/19.   OT comments  Pt making progress in therapy, demonstrating improved balance, activity tolerance, and independence with ADLs and transfers. Continued education with pt on sternal precautions with pt requiring min cues to recall. Educated pt and wife on safety strategies and compensatory techniques for dressing and bathing tasks. Pt able to don button up shirt with min assist to bring around back and thread RUE. Pt able to don pants with supervision requiring cues to adhere to sternal precautions. Pt ambulated to/from bathroom and 1 x 5 min around unit with min guard. 0 instances of LOB, however pt unsteady on feet. Educated pt on activity pacing and energy conservation techniques with good understanding. Practiced walk-in shower transfer with pt able to complete with min guard. All questions/concerns answered at this time. OT will continue to follow acutely.    Follow Up Recommendations  Supervision/Assistance - 24 hour    Equipment Recommendations  None recommended by OT    Recommendations for Other Services      Precautions / Restrictions Precautions Precautions: Sternal;Fall       Mobility Bed Mobility               General bed mobility comments: Pt seated in bedside chair upon OT arrival  Transfers Overall transfer level: Needs assistance Equipment used: None Transfers: Sit to/from Stand Sit to Stand: Min guard         General transfer comment: To ensure balance and safety. Cues for hand placement and body positioning.    Balance Overall balance assessment: Mild  deficits observed, not formally tested                                         ADL either performed or assessed with clinical judgement   ADL Overall ADL's : Needs assistance/impaired                 Upper Body Dressing : Minimal assistance;Sitting Upper Body Dressing Details (indicate cue type and reason): While seated in bedside chair. Donned button up shirt with assist to bring around back and thread RUE. Pt demo good adherence to sternal precautions. Wife present stating that she plans on assisting pt as needed with ADLs. Lower Body Dressing: Moderate assistance;Sit to/from stand Lower Body Dressing Details (indicate cue type and reason): Pt able to don pants with supervision and min cues to adhere to sternal precautions. Pt able to complete figure four position with LLE only. Assist to don socks and shoes while seated due to limited BLE ROM.          Tub/ Shower Transfer: Min guard;Walk-in shower;Grab bars Tub/Shower Transfer Details (indicate cue type and reason): Simulated in room. Pt demo good balance and safety awareness. Functional mobility during ADLs: Min guard General ADL Comments: Pt able to ambulate to/from bathroom and around unit with min guard and without use of an AD. 0 instances of LOB, however pt unsteady on feet. Pt tolerated standing 5 min.      Vision  Perception     Praxis      Cognition Arousal/Alertness: Awake/alert Behavior During Therapy: WFL for tasks assessed/performed Overall Cognitive Status: Within Functional Limits for tasks assessed                                 General Comments: Pt pleasant and willing to participate in therapy. Pt able to follow multi-step instructions with increased time needed to complete tasks.         Exercises     Shoulder Instructions       General Comments No signs/symptoms of distress. HR max 119 during session.    Pertinent Vitals/ Pain       Pain Assessment:  No/denies pain  Home Living                                          Prior Functioning/Environment              Frequency           Progress Toward Goals  OT Goals(current goals can now be found in the care plan section)  Progress towards OT goals: Progressing toward goals  ADL Goals Pt Will Perform Upper Body Bathing: with set-up;with supervision;sitting Pt Will Perform Lower Body Bathing: with min guard assist;sit to/from stand;with adaptive equipment Pt Will Perform Upper Body Dressing: with set-up;with supervision;sitting Pt Will Perform Lower Body Dressing: with min guard assist;sit to/from stand;with adaptive equipment Additional ADL Goal #1: Pt will demonstrate ability to adhere to sternal precautions during ADL tasks with min vc Additional ADL Goal #2: Pt will independently verbalize 3 strategies for energy conservation.  Plan Discharge plan remains appropriate    Co-evaluation                 AM-PAC OT "6 Clicks" Daily Activity     Outcome Measure   Help from another person eating meals?: A Little Help from another person taking care of personal grooming?: A Little Help from another person toileting, which includes using toliet, bedpan, or urinal?: A Lot Help from another person bathing (including washing, rinsing, drying)?: A Lot Help from another person to put on and taking off regular upper body clothing?: A Little Help from another person to put on and taking off regular lower body clothing?: A Lot 6 Click Score: 15    End of Session Equipment Utilized During Treatment: Gait belt  OT Visit Diagnosis: Unsteadiness on feet (R26.81);Muscle weakness (generalized) (M62.81)   Activity Tolerance Patient limited by fatigue   Patient Left in chair;with call bell/phone within reach;with family/visitor present   Nurse Communication Mobility status        Time: 1242-1310 OT Time Calculation (min): 28 min  Charges: OT General  Charges $OT Visit: 1 Visit OT Treatments $Self Care/Home Management : 8-22 mins $Therapeutic Activity: 8-22 mins  John Dodson OTR/L [phone]   John Dodson 08/30/2019, 1:23 PM

## 2019-08-30 NOTE — Progress Notes (Signed)
CARDIAC REHAB PHASE I   PRE:  Rate/Rhythm: 110 vpaced    BP: sitting 156/62    SaO2: 98 2L  MODE:  Ambulation: 180 ft   POST:  Rate/Rhythm: 123 vpacing    BP: sitting to xray     SaO2: 93 RA  Pt able to stand and walk halls independently. No O2 needed. Steady, no c/o. This was second walk today. Transport came for CXR. Encouraged IS and more walks. Progressing well. Left on RA. [id number]   Darrick Meigs CES, ACSM 08/30/2019 8:53 AM

## 2019-08-30 NOTE — Progress Notes (Addendum)
Physical Therapy Treatment Patient Details Name: John Dodson MRN: [id number] DOB: June 16, 1960 Today's Date: 08/30/2019    History of Present Illness Patient is a 59 y/o male with PMH DM, CAD, CKD, CHB s/p PPM, NSTEMI, Basal cell CA, peripheral neuropathy, Covid infection 12/20, bilat PE 1/21, and h/o coronary stenting presented with CP.  Cath revealed stent stenoses pt now s/p CABG x 4 08/25/19.    PT Comments    Pt much improved with mobility since seen on Friday. Should be able to return home with wife when medically ready.    Follow Up Recommendations  No PT follow up     Equipment Recommendations  None recommended by PT    Recommendations for Other Services       Precautions / Restrictions Precautions Precautions: Sternal;Fall    Mobility  Bed Mobility               General bed mobility comments: Pt up in recliner  Transfers Overall transfer level: Modified independent Equipment used: None Transfers: Sit to/from Stand Sit to Stand: Modified independent (Device/Increase time)         General transfer comment: Pt performed holding heart pillow to chest for sternal precautions  Ambulation/Gait Ambulation/Gait assistance: Supervision Gait Distance (Feet): 175 Feet Assistive device: None Gait Pattern/deviations: Step-through pattern;Decreased step length - right;Decreased step length - left Gait velocity: decr Gait velocity interpretation: 1.31 - 2.62 ft/sec, indicative of limited community ambulator General Gait Details: Gait slow and measured. Fatigues easily   Marine scientist [place] (Stroke Patients Only)       Balance Overall balance assessment: Mild deficits observed, not formally tested                                          Cognition Arousal/Alertness: Awake/alert Behavior During Therapy: WFL for tasks assessed/performed Overall Cognitive Status: Within Functional Limits  for tasks assessed                                       Exercises      General Comments General comments (skin integrity, edema, etc.): HR 118 with amb      Pertinent Vitals/Pain Pain Assessment: No/denies pain    Home Living                      Prior Function            PT Goals (current goals can now be found in the care plan section) Progress towards PT goals: Progressing toward goals    Frequency    Min 3X/week      PT Plan Current plan remains appropriate    Co-evaluation              AM-PAC PT "6 Clicks" Mobility   Outcome Measure  Help needed turning from your back to your side while in a flat bed without using bedrails?: None Help needed moving from lying on your back to sitting on the side of a flat bed without using bedrails?: A Little Help needed moving to and from a bed to a chair (including a wheelchair)?: None Help needed standing up from a chair using your arms (e.g., wheelchair or  bedside chair)?: None Help needed to walk in hospital room?: None Help needed climbing 3-5 steps with a railing? : A Little 6 Click Score: 22    End of Session   Activity Tolerance: Patient tolerated treatment well Patient left: in chair;with call bell/phone within reach;with family/visitor present   PT Visit Diagnosis: Difficulty in walking, not elsewhere classified (R26.2);Muscle weakness (generalized) (M62.81)     Time: [id number] PT Time Calculation (min) (ACUTE ONLY): 18 min  Charges:  $Gait Training: 8-22 mins                     [place] Pager [phone] Office [place] 08/30/2019, 3:32 PM

## 2019-08-30 NOTE — Progress Notes (Deleted)
Cardiology Office Note:    Date:  08/30/2019   ID:  CATHY FURY, DOB Mar 25, 1961, MRN [id number]  PCP:  Kristopher Glee., MD  Cardiologist:  Glenetta Hew, MD   Referring MD: Kristopher Glee., MD   No chief complaint on file. ***  History of Present Illness:    John Dodson is a 59 y.o. male with a hx of HTN, OSA on BiPAP, DM2, CKD stage III, CHB s/p PPM, and CAD s/p PCI.  He has a complex CAD history that includes multiple PCIs to RCA, Cx branches, and proximal LAD. He presented to clinic with chest pain relieved with nitro. He was sent for repeat heart cath on 08/20/19 which revealed essentially patent stents, but DFR positive lesions in the ostial Cx and mid LAD. TCTS was consulted and he was taken for bypass surgery on 08/25/19. CABG x 4: LIMA-LAD, sequential radial artery to OM2-PDA, RIMA-ramus. Isordil for radial artery graft.     CAD s/p CABG x 4 (07/2019) - radial artery graft --> continue isordil - continue aSA, plavix    CHB s/p PPM in place -    Hypertension   Hyperlipidemia 08/24/2019: Cholesterol 122; HDL 29; LDL Cholesterol 64; VLDL 29 08/25/2019: Triglycerides 78 - continue statin - 40 mg lipitor   OSA on BiPAP    Past Medical History:  Diagnosis Date  . Blepharitis of both eyes    Chronic  . Cataracts, both eyes   . CKD (chronic kidney disease) stage 3, GFR 30-59 ml/min    Status post right nephrectomy/adrenalectomy and diabetic nephropathy (baseline creatinine 1.4-1.5)  . Coronary artery disease involving native coronary artery 2015   (No cath report prior to 2016 noted, but as of 2016, had stents in LAD, OM and L PDA) as documented since now in proximal LAD, OM 2-2 stents, L PDA at least 3 if not 4 stents, and also now nondominant RCA.  Marland Kitchen COVID-19 virus infection 04/25/2019  . Diabetic peripheral neuropathy associated with type 2 diabetes mellitus ([place])   . Essential hypertension   . GERD without esophagitis   . History of basal cell  carcinoma (BCC) excision   . History of complete heart block 06/2016   Status post pacemaker placement  . History of non-ST elevation myocardial infarction (NSTEMI)    And several occasions of unstable angina  . Hyperlipidemia associated with type 2 diabetes mellitus [facility])    Per PCP note in February 2021, was on atorvastatin 40 mg.  . Obesity (BMI 30.0-34.9)   . Occlusion of left vertebral artery    Consider repossible acute lesion in August 2020 with presentation of headache.  CTA suggested occluded left vertebral artery throughout the neck.  Faint string-like enhancement intermittently visible suggesting recent occlusion.  Partial reconstruction and posterior fossa.  Left PICA patent.  (Consider possible left vertebral artery dissection)  . OSA treated with BiPAP 02/2019   Diagnosis in late 2020 ([facility])--> delayed onset of treatment with BiPAP due to Covid hospitalization followed by PE. ->  BiPAP setting at 21/17 cm  . Pulmonary embolism ([place]) 05/18/2019   (1 month following COVID-19 infection): DVT-PE (bilateral PEs noted on VQ scan)-> started on warfarin.  (On warfarin plus Plavix now with aspirin stopped.)  . Type 2 diabetes mellitus with complication, with long-term current use of insulin (HCC)    On Lantus, Jardiance, Metformin & Onglyza    Past Surgical History:  Procedure Laterality Date  . BASAL CELL CARCINOMA EXCISION  01/2015  .  CAROTID DUPLEX SCAN  12/25/2018   [facility]: Mild plaque in both carotids.  139% bilateral.  Right vertebral flow normal antegrade, Left not seen; normal bilateral subclavian flow..  . CHOLECYSTECTOMY    . CORONARY ARTERY BYPASS GRAFT N/A 08/25/2019   Procedure: CORONARY ARTERY BYPASS GRAFTING (CABG) TIMES FOUR, ON PUMP, USING LEFT AND RIGHT INTERNAL MAMMARY ARTERY AND LEFT RADIAL ARTERY;  Surgeon: [name], MD;  Location: [place];  Service: Open Heart Surgery;  Laterality: N/A;  . CORONARY STENT INTERVENTION  04/10/2015    ([place]; Bishop Limbo, DO) -> DES PCI mLPDA: Xience Alpine DES 2.5 mm x 18 mm, Xience Alpine DES 2.25 mm x 12 mm overlapping.  . CORONARY STENT INTERVENTION  04/04/2016   ([place]; Cisco, MD)--> (urgent) 100% mLPDA PTCA with 2.25 mm balloon - reduced to 50%; mid OM2 90% - DES PCI (Xience DES  2.5 x 18).   . CORONARY STENT INTERVENTION  2015   Prior to December 2016, STENTS noted in prox LAD, proximal OM1, and at least 2 stents in proximal L PDA  . CORONARY STENT INTERVENTION  11/17/2018   ([place]; Bishop Limbo, DO): CULPRIT: mid 90% (DES PCI) -Resolute Onyx DES 2.5 mm x 30 mm --> COMPLICATION: Large left arm hematoma-evaluated by vascular and orthopedic surgery, managed with splint and arm elevation.  . CORONARY STENT INTERVENTION  12/28/2018   ([place]; Clarene Critchley, MD, referred by Dr. Mathis Bud) --> INDICATION (urgent, Unstable Angina): Moderate-severe (70%) stenosis of ostial RCA  -> DES PCI Resolute Onyx DES 2.5 mm x 12 mm.   Marland Kitchen LEFT HEART CATH AND CORONARY ANGIOGRAPHY  04/10/2015   ([place]; Bishop Limbo, DO)--> EF 55%.  Mild inferior HK.  Coronaries-LM: Normal, p LAD STENT 30% ISR, mLAD 20% & diffuse dLAD ~20%; Dom LCx: mCx 20%, ~pOM1 STENT (noted as OM2 in other reports) patent with mid 30%, OM2 normal, prox LPDA "STENTS" patent with SEVERE mid L PDA 90-95% (DES PCI x 2 overlapping); Small-non-dominant RCA  patent   . LEFT HEART CATH AND CORONARY ANGIOGRAPHY  04/04/2016   ([place]; Coolidge, MD)--> (urgent): EF 70%.  Previous LAD,~OM2 and proximal PDA stents patent; -> mLAD 35%, dLAD 40%; LCx-OM1 75% (short lesion, med Rx), mid OM2 90% (DES PCI), pLPDA stents patent w/ mPDA 100% (PTCA only - reduced to 50%); non-dom RCA - ost RCA 60% & mRCA 75%  . LEFT HEART CATH AND CORONARY ANGIOGRAPHY  11/17/2018   ([place]; Bishop Limbo, DO) indication: Angina.  LM normal; LAD - ost LAD  ~50%, pLAD STENT ~30% ISR with dLAD ~40%; Dom LCx - ost & prox Cx 30%, OM1 STENT 20% ISR, OM2 STENT 50% distal edge, pLPDA overlapping STENTS ~20%; nonDom RCA - proxRCA 40%, mid 90% (DES PCI)  . LEFT HEART CATH AND CORONARY ANGIOGRAPHY  12/28/2018   ([place]; Clarene Critchley, MD, referred by Dr. Mathis Bud) --> INDICATION (urgent, Unstable Angina): Moderate-severe (70%) stenosis of ostial RCA (DES PCI), mRCA 30%.  Otherwise no significant change from July 2020: dLM 20%, pLAD ~40% ISR , dLAD long/diffuse ~50%; OstLCx 30%, OM1 ~15% OM2 ~30% with patent  LPDA stents/PTCA site.   Marland Kitchen LEFT HEART CATH AND CORONARY ANGIOGRAPHY N/A 08/20/2019   Procedure: LEFT HEART CATH AND CORONARY ANGIOGRAPHY;  Surgeon: Leonie Man, MD;  Location: [place] CV [facility];  Service: Cardiovascular;  Laterality: N/A;  . NEPHRECTOMY Right 2012   With  adrenalectomy  . NM MYOVIEW LTD  08/20/2018   [facility] -> Lexiscan Myoview: EF 81%.  No reversible ischemia or infarction.  Normal wall motion.  Marland Kitchen PACEMAKER IMPLANT  06/2016   [facility] [place] ([employer]) -> according to CT of chest, leads positioned in right atrium, cardiac apex and coronary sinus (suggesting CRT-P - BiV Pacing))  . RADIAL ARTERY HARVEST Left 08/25/2019   Procedure: RADIAL ARTERY HARVEST;  Surgeon: [name], MD;  Location: [place];  Service: Open Heart Surgery;  Laterality: Left;  . TEE WITHOUT CARDIOVERSION N/A 08/25/2019   Procedure: TRANSESOPHAGEAL ECHOCARDIOGRAM (TEE);  Surgeon: [name], MD;  Location: [place];  Service: Open Heart Surgery;  Laterality: N/A;  . TRANSTHORACIC ECHOCARDIOGRAM  11/2018; 05/01/2019   ([place]) a) EF 60-65%.  Mild TR.;; b)moderate concentric LVH.  EF 55 to 60%.  No R WMA.  Normal RV size and function.  Normal atrial sizes.  Normal valves.    Current Medications: No outpatient medications have been marked as taking for the 09/01/19 encounter (Appointment)  with Ledora Bottcher, [place].     Allergies:   Latex, Codeine, Contrast media [iodinated diagnostic agents], Integrilin [eptifibatide], Tylenol [acetaminophen], Zithromax [azithromycin], and Glipizide   Social History   Socioeconomic History  . Marital status: Married    Spouse name: Not on file  . Number of children: Not on file  . Years of education: Not on file  . Highest education level: Not on file  Occupational History  . Not on file  Tobacco Use  . Smoking status: Never Smoker  . Smokeless tobacco: Never Used  Substance and Sexual Activity  . Alcohol use: No  . Drug use: No  . Sexual activity: Not on file  Other Topics Concern  . Not on file  Social History Narrative  . Not on file   Social Determinants of Health   Financial Resource Strain:   . Difficulty of Paying Living Expenses:   Food Insecurity:   . Worried About Charity fundraiser in the Last Year:   . Arboriculturist in the Last Year:   Transportation Needs:   . Film/video editor (Medical):   Marland Kitchen Lack of Transportation (Non-Medical):   Physical Activity:   . Days of Exercise per Week:   . Minutes of Exercise per Session:   Stress:   . Feeling of Stress :   Social Connections:   . Frequency of Communication with Friends and Family:   . Frequency of Social Gatherings with Friends and Family:   . Attends Religious Services:   . Active Member of Clubs or Organizations:   . Attends Archivist Meetings:   Marland Kitchen Marital Status:      Family History: The patient's ***family history includes Coronary artery disease in his father; Hyperlipidemia in his father and mother; Hypertension in his father and mother; Stroke in his mother.  ROS:   Please see the history of present illness.    *** All other systems reviewed and are negative.  EKGs/Labs/Other Studies Reviewed:    The following studies were reviewed today:  Left heart cath 08/20/19  The left ventricular systolic function is normal. The  left ventricular ejection fraction is 55-65% by visual estimate. LV end diastolic pressure is normal.LV end diastolic pressure is normal.  -------------------  There is stent from ostial to almost distal small nondominant RCA: Ost RCA to Mid RCA stent is diffusely 35% stenosed.  Colon Flattery  Cx to Prox Cx lesion is 60% stenosed.  Ost LAD to Mid LAD stent is 10% stenosed. Mid LAD to Dist LAD lesion is 60% stenosed.  Previously placed 2nd Mrg stent (DES) is widely patent.  Lat 2nd Mrg lesion is 80% stenosed. Previously described.  Previously placed 1st LPL stent (DES) is widely patent.  LPAV lesion is 50% stenosed.  LPDA stent is 5% stenosed.   Multivessel CAD with essentially patent stents in proximal to mid LAD, proximal OM1, proximal LPL1, several stents in the L PDA as well as RCA that is a small nondominant vessel. No obvious lesions within stents noted.  Ostial LCx roughly 60%-->highly DFR positive with 0.84  Long mid LAD 60% after stent--highly DFR +0.67-0.73  Normal LVEDP  With significant LAD as well as ostial LCx disease--best course of action is CVTS consultation for CABG  Treatments: surgery:    Coronary Artery Bypass Grafting x4 Left Internal Mammary Artery to Distal Left Anterior Descending Coronary Artery; left radialartery graft toleft posterior Descending Coronary Arterymid marginal Branch of Left Circumflex Coronary Arteryis a sequenced graft,pedicled right internal mammary graft toramus intermediuscoronary Artery Bilateral internal mammary artery harvesting OpenLeft radial artery harvesting multilevellevel rib block Completion graft surveillance with indocyanine green fluorescence angiography(SPY)   EKG:  EKG is *** ordered today.  The ekg ordered today demonstrates ***  Recent Labs: 08/25/2019: ALT 25 08/27/2019: Magnesium 2.7 08/30/2019: BUN 38; Creatinine, Ser 1.87; Hemoglobin 9.4; Platelets 153; Potassium 3.7; Sodium 140  Recent  Lipid Panel    Component Value Date/Time   CHOL 122 08/24/2019 0331   TRIG 78 08/25/2019 2046   HDL 29 (L) 08/24/2019 0331   CHOLHDL 4.2 08/24/2019 0331   VLDL 29 08/24/2019 0331   LDLCALC 64 08/24/2019 0331    Physical Exam:    VS:  There were no vitals taken for this visit.    Wt Readings from Last 3 Encounters:  08/30/19 209 lb 3.5 oz (94.9 kg)  08/12/19 213 lb (96.6 kg)  07/15/19 212 lb (96.2 kg)     GEN: *** Well nourished, well developed in no acute distress HEENT: Normal NECK: No JVD; No carotid bruits LYMPHATICS: No lymphadenopathy CARDIAC: ***RRR, no murmurs, rubs, gallops RESPIRATORY:  Clear to auscultation without rales, wheezing or rhonchi  ABDOMEN: Soft, non-tender, non-distended MUSCULOSKELETAL:  No edema; No deformity  SKIN: Warm and dry NEUROLOGIC:  Alert and oriented x 3 PSYCHIATRIC:  Normal affect   ASSESSMENT:    No diagnosis found. PLAN:    In order of problems listed above:  No diagnosis found.   Medication Adjustments/Labs and Tests Ordered: Current medicines are reviewed at length with the patient today.  Concerns regarding medicines are outlined above.  No orders of the defined types were placed in this encounter.  No orders of the defined types were placed in this encounter.   Signed, Ledora Bottcher, PA  08/30/2019 9:01 AM    [place]

## 2019-08-30 NOTE — Progress Notes (Signed)
EPW removed per order. Pt tolerated well, ends intact. Pt instructed to stay on bed rest x 1 hour, pt voiced understanding. VSS. Will continue to monitor.  Amanda Cockayne, RN

## 2019-08-30 NOTE — Progress Notes (Signed)
      [address]       [place] [address]             [phone]      5 Days Post-Op Procedure(s) (LRB): CORONARY ARTERY BYPASS GRAFTING (CABG) TIMES FOUR, ON PUMP, USING LEFT AND RIGHT INTERNAL MAMMARY ARTERY AND LEFT RADIAL ARTERY (N/A) RADIAL ARTERY HARVEST (Left) TRANSESOPHAGEAL ECHOCARDIOGRAM (TEE) (N/A) INDOCYANINE GREEN FLUORESCENCE IMAGING (ICG) (N/A)   Subjective:  No new complaints.  Up in chair.  + ambulation  + BM  Objective: Vital signs in last 24 hours: Temp:  [97.8 F (36.6 C)-99.4 F (37.4 C)] 97.8 F (36.6 C) (05/03 0455) Pulse Rate:  [94-117] 94 (05/03 0455) Cardiac Rhythm: Ventricular paced (05/02 1900) Resp:  [15-26] 20 (05/03 0455) BP: (101-137)/(53-82) 123/66 (05/03 0455) SpO2:  [94 %-100 %] 97 % (05/03 0455) Weight:  [94.9 kg] 94.9 kg (05/03 0455)  Intake/Output from previous day: 05/02 0701 - 05/03 0700 In: 297.2 [P.O.:120; I.V.:33; IV Piggyback:144.2] Out: 660 [Urine:660]  General appearance: alert, cooperative and no distress Heart: regular rate and rhythm and paced Lungs: clear to auscultation bilaterally Abdomen: soft, non-tender; bowel sounds normal; no masses,  no organomegaly Extremities: edema trace Wound: clean and dry  Lab Results: Recent Labs    08/29/19 0320 08/30/19 0314  WBC 11.5* 8.8  HGB 9.7* 9.4*  HCT 31.2* 30.1*  PLT 146* 153   BMET:  Recent Labs    08/29/19 0320 08/30/19 0314  NA 145 140  K 3.3* 3.7  CL 103 97*  CO2 32 34*  GLUCOSE 110* 130*  BUN 36* 38*  CREATININE 1.71* 1.87*  CALCIUM 8.5* 8.4*    PT/INR: No results for input(s): LABPROT, INR in the last 72 hours. ABG    Component Value Date/Time   PHART 7.312 (L) 08/27/2019 0352   HCO3 19.8 (L) 08/27/2019 0352   TCO2 21 (L) 08/27/2019 0352   ACIDBASEDEF 6.0 (H) 08/27/2019 0352   O2SAT 82.0 08/27/2019 0352   CBG (last 3)  Recent Labs    08/29/19 2001 08/29/19 2335 08/30/19 0434  GLUCAP 241* 184* 105*     Assessment/Plan: S/P Procedure(s) (LRB): CORONARY ARTERY BYPASS GRAFTING (CABG) TIMES FOUR, ON PUMP, USING LEFT AND RIGHT INTERNAL MAMMARY ARTERY AND LEFT RADIAL ARTERY (N/A) RADIAL ARTERY HARVEST (Left) TRANSESOPHAGEAL ECHOCARDIOGRAM (TEE) (N/A) INDOCYANINE GREEN FLUORESCENCE IMAGING (ICG) (N/A)  1. CV- Paced rhythm 2. Pulm- no acute issues, wean oxygen as tolerated.. will get baseline 2V CXR in AM 3. Renal- creatinine stable, weight is below baseline, continue Lasix, supplement K 4. Expected post operative blood loss anemia, mild Hgb at 9.4 5. DM- sugars are elevated, continue insulin, SSIP for now.. will transition to oral medications at discharge 6. dispo- patient stable, will d/c EPW today, wean oxygen as tolerated, continue diuretics, likely d/c in AM   LOS: 10 days    Ellwood Handler, PA-C  08/30/2019

## 2019-08-31 ENCOUNTER — Other Ambulatory Visit: Payer: Self-pay | Admitting: Physician Assistant

## 2019-08-31 LAB — BASIC METABOLIC PANEL
Anion gap: 8 (ref 5–15)
BUN: 30 mg/dL — ABNORMAL HIGH (ref 6–20)
CO2: 29 mmol/L (ref 22–32)
Calcium: 8.4 mg/dL — ABNORMAL LOW (ref 8.9–10.3)
Chloride: 101 mmol/L (ref 98–111)
Creatinine, Ser: 1.55 mg/dL — ABNORMAL HIGH (ref 0.61–1.24)
GFR calc Af Amer: 57 mL/min — ABNORMAL LOW (ref >60)
GFR calc non Af Amer: 49 mL/min — ABNORMAL LOW (ref >60)
Glucose, Bld: 174 mg/dL — ABNORMAL HIGH (ref 70–99)
Potassium: 3.7 mmol/L (ref 3.5–5.1)
Sodium: 138 mmol/L (ref 135–145)

## 2019-08-31 LAB — GLUCOSE, CAPILLARY: Glucose-Capillary: 153 mg/dL — ABNORMAL HIGH (ref 70–99)

## 2019-08-31 MED ORDER — COLCHICINE 0.6 MG PO TABS: 0.3000 mg | ORAL_TABLET | Freq: Two times a day (BID) | ORAL | 0 refills | Status: DC

## 2019-08-31 MED ORDER — METOPROLOL TARTRATE 25 MG PO TABS: 25.0000 mg | ORAL_TABLET | Freq: Two times a day (BID) | ORAL | 3 refills | Status: DC

## 2019-08-31 MED ORDER — ASPIRIN 81 MG PO TBEC: 81.0000 mg | DELAYED_RELEASE_TABLET | Freq: Every day | ORAL | Status: DC

## 2019-08-31 MED ORDER — TRAMADOL HCL 50 MG PO TABS: 50.0000 mg | ORAL_TABLET | ORAL | 0 refills | Status: DC | PRN

## 2019-08-31 MED ORDER — ISOSORBIDE DINITRATE 10 MG PO TABS: 10.0000 mg | ORAL_TABLET | Freq: Three times a day (TID) | ORAL | 0 refills | Status: DC

## 2019-08-31 NOTE — Care Management Important Message (Signed)
Important Message  Patient Details  Name: John Dodson MRN: [id number] Date of Birth: 1960/08/09   Medicare Important Message Given:  Yes     Shelda Altes 08/31/2019, 10:00 AM

## 2019-08-31 NOTE — Plan of Care (Signed)
  Problem: Education: Goal: Knowledge of General Education information will improve Description: Including pain rating scale, medication(s)/side effects and non-pharmacologic comfort measures Outcome: Adequate for Discharge   Problem: Health Behavior/Discharge Planning: Goal: Ability to manage health-related needs will improve Outcome: Adequate for Discharge   Problem: Clinical Measurements: Goal: Ability to maintain clinical measurements within normal limits will improve Outcome: Adequate for Discharge Goal: Will remain free from infection Outcome: Adequate for Discharge Goal: Diagnostic test results will improve Outcome: Adequate for Discharge Goal: Respiratory complications will improve Outcome: Adequate for Discharge Goal: Cardiovascular complication will be avoided Outcome: Adequate for Discharge   Problem: Activity: Goal: Risk for activity intolerance will decrease Outcome: Adequate for Discharge   Problem: Coping: Goal: Level of anxiety will decrease Outcome: Adequate for Discharge   Problem: Pain Managment: Goal: General experience of comfort will improve Outcome: Adequate for Discharge   Problem: Safety: Goal: Ability to remain free from injury will improve Outcome: Adequate for Discharge   Problem: Skin Integrity: Goal: Risk for impaired skin integrity will decrease Outcome: Adequate for Discharge   Problem: Education: Goal: Will demonstrate proper wound care and an understanding of methods to prevent future damage Outcome: Adequate for Discharge Goal: Knowledge of the prescribed therapeutic regimen will improve Outcome: Adequate for Discharge Goal: Individualized Educational Video(s) Outcome: Adequate for Discharge   Problem: Activity: Goal: Risk for activity intolerance will decrease Outcome: Adequate for Discharge   Problem: Cardiac: Goal: Will achieve and/or maintain hemodynamic stability Outcome: Adequate for Discharge   Problem: Clinical  Measurements: Goal: Postoperative complications will be avoided or minimized Outcome: Adequate for Discharge   Problem: Respiratory: Goal: Respiratory status will improve Outcome: Adequate for Discharge   Problem: Skin Integrity: Goal: Wound healing without signs and symptoms of infection Outcome: Adequate for Discharge Goal: Risk for impaired skin integrity will decrease Outcome: Adequate for Discharge

## 2019-08-31 NOTE — Progress Notes (Signed)
Discharge instructions given to Mr and Mrs Goldberg.  Discussed new medications, medication changes and side effects.  Discussed signs and symptoms to watch for and when to contact the physician.  Discussed activities and follow up appointments.  Verbalized understanding.

## 2019-08-31 NOTE — Progress Notes (Cosign Needed)
      [address]       [place] [address]             [phone]      6 Days Post-Op Procedure(s) (LRB): CORONARY ARTERY BYPASS GRAFTING (CABG) TIMES FOUR, ON PUMP, USING LEFT AND RIGHT INTERNAL MAMMARY ARTERY AND LEFT RADIAL ARTERY (N/A) RADIAL ARTERY HARVEST (Left) TRANSESOPHAGEAL ECHOCARDIOGRAM (TEE) (N/A) INDOCYANINE GREEN FLUORESCENCE IMAGING (ICG) (N/A)   Subjective:  No new complaints.  He is off oxygen, walked well yesterday w/o difficulty.  + BM  Objective: Vital signs in last 24 hours: Temp:  [97.6 F (36.4 C)-98.6 F (37 C)] 98.6 F (37 C) (05/04 0439) Pulse Rate:  [99-113] 103 (05/04 0439) Cardiac Rhythm: Ventricular paced (05/03 1955) Resp:  [19-23] 23 (05/04 0439) BP: (112-150)/(57-84) 149/74 (05/04 0439) SpO2:  [90 %-100 %] 97 % (05/04 0439) Weight:  [94.4 kg] 94.4 kg (05/04 0439)  Intake/Output from previous day: 05/03 0701 - 05/04 0700 In: 608 [P.O.:600; I.V.:8] Out: -   General appearance: alert, cooperative and no distress Heart: regular rate and rhythm and paced Lungs: clear to auscultation bilaterally Abdomen: soft, non-tender; bowel sounds normal; no masses,  no organomegaly Extremities: extremities normal, atraumatic, no cyanosis or edema Wound: clean and dry  Lab Results: Recent Labs    08/29/19 0320 08/30/19 0314  WBC 11.5* 8.8  HGB 9.7* 9.4*  HCT 31.2* 30.1*  PLT 146* 153   BMET:  Recent Labs    08/30/19 0314 08/31/19 0336  NA 140 138  K 3.7 3.7  CL 97* 101  CO2 34* 29  GLUCOSE 130* 174*  BUN 38* 30*  CREATININE 1.87* 1.55*  CALCIUM 8.4* 8.4*    PT/INR: No results for input(s): LABPROT, INR in the last 72 hours. ABG    Component Value Date/Time   PHART 7.312 (L) 08/27/2019 0352   HCO3 19.8 (L) 08/27/2019 0352   TCO2 21 (L) 08/27/2019 0352   ACIDBASEDEF 6.0 (H) 08/27/2019 0352   O2SAT 82.0 08/27/2019 0352   CBG (last 3)  Recent Labs    08/30/19 1109 08/30/19 1634 08/31/19 0613  GLUCAP 231*  196* 153*    Assessment/Plan: S/P Procedure(s) (LRB): CORONARY ARTERY BYPASS GRAFTING (CABG) TIMES FOUR, ON PUMP, USING LEFT AND RIGHT INTERNAL MAMMARY ARTERY AND LEFT RADIAL ARTERY (N/A) RADIAL ARTERY HARVEST (Left) TRANSESOPHAGEAL ECHOCARDIOGRAM (TEE) (N/A) INDOCYANINE GREEN FLUORESCENCE IMAGING (ICG) (N/A)  1. CV- Paced rhythm 2. Pulm- no acute issues, continue IS 3. Renal- weight below baseline, no need for lasix at this time 4. DM- will resume home diabetic medications 5. Dispo- patient stable, will d/c patient today   LOS: 11 days    Ellwood Handler, PA-C 08/31/2019

## 2019-08-31 NOTE — Progress Notes (Addendum)
Discussed sternal precautions, IS, exercise, diet, and CRPII with pt and wife. Good reception. Will refer to [facility]. He was doing program recently. [place] CES, ACSM 8:49 AM 08/31/2019

## 2019-09-01 ENCOUNTER — Telehealth (HOSPITAL_COMMUNITY): Payer: Self-pay

## 2019-09-01 ENCOUNTER — Ambulatory Visit: Payer: Medicare Other | Admitting: Physician Assistant

## 2019-09-01 NOTE — Telephone Encounter (Signed)
Faxed referral to [facility] for [place].

## 2019-09-06 ENCOUNTER — Ambulatory Visit (INDEPENDENT_AMBULATORY_CARE_PROVIDER_SITE_OTHER): Payer: Self-pay | Admitting: Physician Assistant

## 2019-09-06 ENCOUNTER — Ambulatory Visit
Admission: RE | Admit: 2019-09-06 | Discharge: 2019-09-06 | Disposition: A | Discharge status: Home / Self Care | Payer: Medicare Other | Source: Ambulatory Visit | Attending: Cardiothoracic Surgery | Admitting: Cardiothoracic Surgery

## 2019-09-06 ENCOUNTER — Other Ambulatory Visit: Payer: Self-pay

## 2019-09-06 VITALS — BP 147/81 | HR 104 | Temp 98.5°F | Resp 20 | Ht 68.0 in | Wt 213.0 lb

## 2019-09-06 DIAGNOSIS — R059 Cough, unspecified: Secondary | ICD-10-CM

## 2019-09-06 DIAGNOSIS — I2510 Atherosclerotic heart disease of native coronary artery without angina pectoris: Secondary | ICD-10-CM

## 2019-09-06 DIAGNOSIS — R05 Cough: Secondary | ICD-10-CM

## 2019-09-06 DIAGNOSIS — Z951 Presence of aortocoronary bypass graft: Secondary | ICD-10-CM

## 2019-09-06 MED ORDER — HYDROCODONE-HOMATROPINE 5-1.5 MG/5ML PO SYRP: 5.0000 mL | ORAL_SOLUTION | Freq: Four times a day (QID) | ORAL | 0 refills | Status: DC | PRN

## 2019-09-06 MED ORDER — ONDANSETRON HCL 8 MG PO TABS
8.0000 mg | ORAL_TABLET | Freq: Three times a day (TID) | ORAL | 0 refills | Status: DC | PRN
Start: 2019-09-06 — End: 2019-11-25

## 2019-09-06 MED ORDER — CEPHALEXIN 500 MG PO CAPS
500.0000 mg | ORAL_CAPSULE | Freq: Three times a day (TID) | ORAL | 0 refills | Status: DC
Start: 2019-09-06 — End: 2019-09-20

## 2019-09-06 MED ORDER — POTASSIUM CHLORIDE ER 20 MEQ PO TBCR: 20.0000 meq | EXTENDED_RELEASE_TABLET | Freq: Every day | ORAL | 0 refills | Status: DC

## 2019-09-06 MED ORDER — FUROSEMIDE 40 MG PO TABS: 40.0000 mg | ORAL_TABLET | Freq: Every day | ORAL | 0 refills | Status: DC

## 2019-09-06 NOTE — Progress Notes (Signed)
HPI:  Patient returns to the office with complaints of cough.  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  His hospital stay was uncomplicated.  He was discharged home on 08/31/2019 in stable condition.  The patient contacted the office today with complaints of cough.  He states his cough developed over the past 4-5 days.  He states that he doesn't have any fevers or chills.  He does notice he is short of breath at times with ambulation and he does experience pain/discomfort in his back/chest when he ambulates.  He describes his cough as being dry.  It is worse at night when trying to lay down and sleep.  He is accompanied by his wife who states he has gained about 3 lbs since hospital discharge.    Current Outpatient Medications  Medication Sig Dispense Refill  . albuterol (VENTOLIN HFA) 108 (90 Base) MCG/ACT inhaler Inhale 2 puffs into the lungs every 4 (four) hours as needed for wheezing or shortness of breath.    Marland Kitchen aspirin EC 81 MG EC tablet Take 1 tablet (81 mg total) by mouth daily.    Marland Kitchen atorvastatin (LIPITOR) 40 MG tablet Take 40 mg by mouth daily.    . calcium-vitamin D (OSCAL WITH D) 500-200 MG-UNIT tablet Take 1 tablet by mouth daily. 50 MCG (2000UNIT ) TAB CHOLECALCIFEROL     . clopidogrel (PLAVIX) 75 MG tablet Take 75 mg by mouth daily.    . colchicine 0.6 MG tablet Take 0.5 tablets (0.3 mg total) by mouth 2 (two) times daily. 60 tablet 0  . diphenhydrAMINE (BENADRYL) 50 MG tablet Take 50 mg by mouth once.    . fluorouracil (EFUDEX) 5 % cream Apply 1 application topically 2 (two) times daily as needed (irritation).     . hydrocortisone 2.5 % lotion Apply 1 application topically 2 (two) times daily as needed (irritation).     . insulin glargine (LANTUS SOLOSTAR) 100 UNIT/ML Solostar Pen Inject 50 Units into the skin every evening. 100UNITS/3 ML     . isosorbide dinitrate (ISORDIL) 10 MG tablet Take 1 tablet (10 mg total) by mouth 3 (three) times daily. 90 tablet 0  . losartan (COZAAR) 50 MG  tablet Take 50 mg by mouth daily.    Marland Kitchen MAGNESIUM OXIDE PO Take 500 mg by mouth daily. TAKE 1  TABLET BY MOUTH NIGHTLY FOR LEG CRAMPS     . metFORMIN (GLUCOPHAGE) 500 MG tablet Take 500 mg by mouth 2 (two) times daily with a meal.    . metoprolol tartrate (LOPRESSOR) 25 MG tablet Take 1 tablet (25 mg total) by mouth 2 (two) times daily. 60 tablet 3  . NITROSTAT 0.4 MG SL tablet Place 0.4 mg under the tongue every 5 (five) minutes as needed for chest pain.     . Omega-3 Fatty Acids (FISH OIL BURP-LESS) 1000 MG CAPS Take 1,000 mg by mouth 2 (two) times daily.    . pantoprazole (PROTONIX) 40 MG tablet Take 40 mg by mouth 2 (two) times daily before a meal.     . predniSONE (DELTASONE) 50 MG tablet Take 50 mg tablet at 8 pm night before and take 50 mg tablet at 2 am the morning of procedure and before you leave home take 50 mg tablet with 50 mg of Benadryl 3 tablet 0  . traMADol (ULTRAM) 50 MG tablet Place 1 tablet (50 mg total) into feeding tube every 4 (four) hours as needed for moderate pain. 30 tablet 0  . triamcinolone  cream (KENALOG) 0.1 % Apply 1 application topically 2 (two) times daily as needed (irritation).     . VOLTAREN 1 % GEL Apply 2 g topically daily as needed (pain).     Marland Kitchen warfarin (COUMADIN) 5 MG tablet Take 5-7.5 mg by mouth See admin instructions. 5 mg on Tues, 7.5 mg all other days     No current facility-administered medications for this visit.    Physical Exam:  BP (!) 147/81   Pulse (!) 104   Temp 98.5 F (36.9 C) (Oral)   Resp 20   Ht 5\' 8"  (1.727 m)   Wt 213 lb (96.6 kg)   SpO2 99% Comment: RA  BMI 32.39 kg/m   Gen: no apparent distress Heart: RRR Lungs: mildly diminished right base Ext: + pitting edema on bilateral LE Incisions: sternotomy C/D/I, left radial artery harvest site is mildly erythematous, warm, with minor swelling just below the elbow  Diagnostic Tests:  CXR: no pneumothorax, trace right pleural effusion, persistent atelectasis, large stool  burden in upper abdomen  A/P:  1. CV- Sinus Tachycardia, HTN-on Lopressor and Cozaar.... may need adjusted at follow up visit next week with Dr. Orvan Seen 2. Pulm- dry cough, not on ACE I- I suspect this is due to underlying atelectasis, mild pleural effusion on right side... will prescribe Hycodan syrup nightly prn for cough, encouraged patient to continue walking and trying to take good deep breaths.  He was encouraged to try Robitussin during the day for his cough 3. Mild LE edema- he has some mild weight gain with associated swelling, will give Lasix 40 mg daily, supplement K 4. Mild cellulitis left radial artery harvest site-- will give Keflex for 7 days 5. Dispo- patient with normal post operative cough, will try supportive measures, lasix for LE edema, mild right pleural effusion, Keflex for early cellulitis of left radial artery site... patient will follow up with Dr. Orvan Seen as scheduled on 09/13/2019.  If his elevated HR and BP persists can titrate Lopressor and Cozaar at that time... can also repeat CXR   Ellwood Handler, PA-C [facility] [phone]

## 2019-09-10 ENCOUNTER — Other Ambulatory Visit: Payer: Self-pay | Admitting: Cardiothoracic Surgery

## 2019-09-10 DIAGNOSIS — Z951 Presence of aortocoronary bypass graft: Secondary | ICD-10-CM

## 2019-09-10 NOTE — Progress Notes (Signed)
cr

## 2019-09-13 ENCOUNTER — Ambulatory Visit (INDEPENDENT_AMBULATORY_CARE_PROVIDER_SITE_OTHER): Payer: Self-pay | Admitting: Cardiothoracic Surgery

## 2019-09-13 ENCOUNTER — Other Ambulatory Visit: Payer: Self-pay

## 2019-09-13 ENCOUNTER — Ambulatory Visit
Admission: RE | Admit: 2019-09-13 | Discharge: 2019-09-13 | Disposition: A | Discharge status: Home / Self Care | Payer: Medicare Other | Source: Ambulatory Visit | Attending: Cardiothoracic Surgery | Admitting: Cardiothoracic Surgery

## 2019-09-13 VITALS — BP 124/81 | HR 102 | Temp 97.9°F | Resp 20 | Ht 68.0 in | Wt 207.0 lb

## 2019-09-13 DIAGNOSIS — Z951 Presence of aortocoronary bypass graft: Secondary | ICD-10-CM

## 2019-09-13 DIAGNOSIS — I2510 Atherosclerotic heart disease of native coronary artery without angina pectoris: Secondary | ICD-10-CM

## 2019-09-16 NOTE — Progress Notes (Signed)
[address]       ,[place] [address]             [phone]     CARDIOTHORACIC SURGERY OFFICE NOTE  Referring Provider is Leonie Man, MD Primary Cardiologist is Glenetta Hew, MD PCP is Kristopher Glee., MD   HPI:  59 year old gentleman underwent coronary bypass grafting several weeks ago.  He did well after surgery and was discharged home.  He now presents for his first visit after discharge.  He does not report any angina or shortness of breath.  His energy level is improving and his appetite has been good.   Current Outpatient Medications  Medication Sig Dispense Refill  . albuterol (VENTOLIN HFA) 108 (90 Base) MCG/ACT inhaler Inhale 2 puffs into the lungs every 4 (four) hours as needed for wheezing or shortness of breath.    Marland Kitchen aspirin EC 81 MG EC tablet Take 1 tablet (81 mg total) by mouth daily.    Marland Kitchen atorvastatin (LIPITOR) 40 MG tablet Take 40 mg by mouth daily.    . calcium-vitamin D (OSCAL WITH D) 500-200 MG-UNIT tablet Take 1 tablet by mouth daily. 50 MCG (2000UNIT ) TAB CHOLECALCIFEROL     . cephALEXin (KEFLEX) 500 MG capsule Take 1 capsule (500 mg total) by mouth 3 (three) times daily. 21 capsule 0  . clopidogrel (PLAVIX) 75 MG tablet Take 75 mg by mouth daily.    . colchicine 0.6 MG tablet Take 0.5 tablets (0.3 mg total) by mouth 2 (two) times daily. 60 tablet 0  . fluorouracil (EFUDEX) 5 % cream Apply 1 application topically 2 (two) times daily as needed (irritation).     . furosemide (LASIX) 40 MG tablet Take 1 tablet (40 mg total) by mouth daily. 7 tablet 0  . HYDROcodone-homatropine (HYCODAN) 5-1.5 MG/5ML syrup Take 5 mLs by mouth every 6 (six) hours as needed for cough. 120 mL 0  . hydrocortisone 2.5 % lotion Apply 1 application topically 2 (two) times daily as needed (irritation).     . insulin glargine (LANTUS SOLOSTAR) 100 UNIT/ML Solostar Pen Inject 50 Units into the skin every evening. 100UNITS/3 ML     . isosorbide dinitrate  (ISORDIL) 10 MG tablet Take 1 tablet (10 mg total) by mouth 3 (three) times daily. 90 tablet 0  . losartan (COZAAR) 50 MG tablet Take 50 mg by mouth daily.    Marland Kitchen MAGNESIUM OXIDE PO Take 500 mg by mouth daily. TAKE 1  TABLET BY MOUTH NIGHTLY FOR LEG CRAMPS     . metFORMIN (GLUCOPHAGE) 500 MG tablet Take 500 mg by mouth 2 (two) times daily with a meal.    . metoprolol tartrate (LOPRESSOR) 25 MG tablet Take 1 tablet (25 mg total) by mouth 2 (two) times daily. 60 tablet 3  . NITROSTAT 0.4 MG SL tablet Place 0.4 mg under the tongue every 5 (five) minutes as needed for chest pain.     . Omega-3 Fatty Acids (FISH OIL BURP-LESS) 1000 MG CAPS Take 1,000 mg by mouth 2 (two) times daily.    . ondansetron (ZOFRAN) 8 MG tablet Take 1 tablet (8 mg total) by mouth every 8 (eight) hours as needed for nausea or vomiting. 20 tablet 0  . pantoprazole (PROTONIX) 40 MG tablet Take 40 mg by mouth 2 (two) times daily before a meal.     . potassium chloride 20 MEQ TBCR Take 20 mEq by mouth daily. 7 tablet 0  . predniSONE (  DELTASONE) 50 MG tablet Take 50 mg tablet at 8 pm night before and take 50 mg tablet at 2 am the morning of procedure and before you leave home take 50 mg tablet with 50 mg of Benadryl 3 tablet 0  . traMADol (ULTRAM) 50 MG tablet Place 1 tablet (50 mg total) into feeding tube every 4 (four) hours as needed for moderate pain. 30 tablet 0  . triamcinolone cream (KENALOG) 0.1 % Apply 1 application topically 2 (two) times daily as needed (irritation).     . VOLTAREN 1 % GEL Apply 2 g topically daily as needed (pain).     Marland Kitchen warfarin (COUMADIN) 5 MG tablet Take 5-7.5 mg by mouth See admin instructions. 5 mg on Tues, 7.5 mg all other days    . diphenhydrAMINE (BENADRYL) 50 MG tablet Take 50 mg by mouth once.     No current facility-administered medications for this visit.      Physical Exam:   BP 124/81 (BP Location: Right Arm, Patient Position: Sitting, Cuff Size: Normal)   Pulse (!) 102 Comment: regular  rate  Temp 97.9 F (36.6 C) (Temporal)   Resp 20   Ht 5\' 8"  (1.727 m)   Wt 93.9 kg   SpO2 95% Comment: RA  BMI 31.47 kg/m   General:  Well-appearing no acute distress  Chest:   Clear to auscultation bilaterally  CV:   Regular rate and rhythm  Incisions:  Clean dry and intact with mild redness of the left forearm incision  Abdomen:  Soft nontender  Extremities:  Warm and well-perfused, minimal edema  Diagnostic Tests:  I have reviewed his available imaging studies and agree with their interpretation   Impression:  Doing well after CABG  Plan:  Follow-up in 2 to 3 weeks Continue to observe sternal precautions  I spent in excess of 15 minutes during the conduct of this office consultation and >50% of this time involved direct face-to-face encounter with the patient for counseling and/or coordination of their care.  Level 2                 10 minutes Level 3                 15 minutes Level 4                 25 minutes Level 5                 40 minutes  B.  Murvin Natal, MD 09/16/2019 9:33 PM

## 2019-09-19 NOTE — Progress Notes (Signed)
Cardiology Office Note   Date:  09/20/2019   ID:  John Dodson, DOB 06/19/1960, MRN [id number]  PCP:  Kristopher Glee., MD  Cardiologist: Dr. Ellyn Hack  No chief complaint on file.    History of Present Illness: John Dodson is a 59 y.o. male who presents for post hospital follow up.  He has a history of multivessel coronary artery disease with PCI to the LAD, OM1 and OM 2, left PDA, and RCA most recently with multiple stents in the RCA in July and August 2020.  He also has a history of COVID -19 complicated by bilateral PE in January 2020 treated with Coumadin.     He was followed up by Dr. Ellyn Hack on July 15, 2019 to establish care.  He was having complaints of chest pain for couple of months not feeling very well since recovering from Covid and pulmonary emboli.  He was planned for cardiac catheterization.      This was completed on 08/20/2019, revealing multivessel CAD with essentially patent stents in the proximal to mid LAD, proximal OM1, proximal LPL 1, several stents in the L PDA as well as RCA that is a small nondominant vessel.  No obvious lesions within stents noted.  Ostial left circumflex roughly 60% greater highly DFR positive with 0.84.  Long mid LAD 60% after stent highly DFR +0 0.67-0.73.  Normal LVEDP.     As result of his significant LAD and ostial left circumflex disease he was considered for coronary artery bypass grafting and CVTS consultation was obtained.  He received coronary artery bypass grafting x4 with LIMA to LAD, left radial artery graft to left posterior descending artery, mid marginal branch of left circumflex coronary artery via sequential graft, pedicle right internal mammary graft to ramus intermedius coronary artery.  Bilateral internal mammary artery harvesting, open left radial artery harvesting, multilevel rib block, on 08/25/2019.      He recovered well postoperatively.  He was treated with IV Lasix for some fluid overload postoperatively.  He was  restarted on home regimen of Cozaar for hypertension and on Coumadin due to his history of PE.  He has been seen by Dr. Orvan Seen, CVTS on 09/13/2019 postoperatively.  There were no post discharge issues.  He is here today feeling well but does have some confusion about his medications.  He continues to have some soreness postoperatively, but denies worsening shortness of breath, significant chest pain, dizziness, near syncope, or significant fatigue.  In fact his energy level is slowly improving.  He is medically compliant.  We reviewed his medications and it appears that he should be on losartan 50 mg daily.  He states he is not taking it.  I have reviewed his discharge summary and it also has the losartan listed.  Blood pressure is normal.  But heart rate is elevated.  He has gone back to using CPAP and is feeling better with this and sleeping better.  He also has a history of pacemaker in situ in the setting of complete heart block, in 2018.Marland Kitchen   Past Medical History:  Diagnosis Date  . Blepharitis of both eyes    Chronic  . Cataracts, both eyes   . CKD (chronic kidney disease) stage 3, GFR 30-59 ml/min    Status post right nephrectomy/adrenalectomy and diabetic nephropathy (baseline creatinine 1.4-1.5)  . Coronary artery disease involving native coronary artery 2015   (No cath report prior to 2016 noted, but as of 2016, had stents in LAD, OM and  L PDA) as documented since now in proximal LAD, OM 2-2 stents, L PDA at least 3 if not 4 stents, and also now nondominant RCA.  Marland Kitchen COVID-19 virus infection 04/25/2019  . Diabetic peripheral neuropathy associated with type 2 diabetes mellitus ([place])   . Essential hypertension   . GERD without esophagitis   . History of basal cell carcinoma (BCC) excision   . History of complete heart block 06/2016   Status post pacemaker placement  . History of non-ST elevation myocardial infarction (NSTEMI)    And several occasions of unstable angina  . Hyperlipidemia  associated with type 2 diabetes mellitus [facility])    Per PCP note in February 2021, was on atorvastatin 40 mg.  . Obesity (BMI 30.0-34.9)   . Occlusion of left vertebral artery    Consider repossible acute lesion in August 2020 with presentation of headache.  CTA suggested occluded left vertebral artery throughout the neck.  Faint string-like enhancement intermittently visible suggesting recent occlusion.  Partial reconstruction and posterior fossa.  Left PICA patent.  (Consider possible left vertebral artery dissection)  . OSA treated with BiPAP 02/2019   Diagnosis in late 2020 ([facility])--> delayed onset of treatment with BiPAP due to Covid hospitalization followed by PE. ->  BiPAP setting at 21/17 cm  . Pulmonary embolism ([place]) 05/18/2019   (1 month following COVID-19 infection): DVT-PE (bilateral PEs noted on VQ scan)-> started on warfarin.  (On warfarin plus Plavix now with aspirin stopped.)  . Type 2 diabetes mellitus with complication, with long-term current use of insulin (HCC)    On Lantus, Jardiance, Metformin & Onglyza    Past Surgical History:  Procedure Laterality Date  . BASAL CELL CARCINOMA EXCISION  01/2015  . CAROTID DUPLEX SCAN  12/25/2018   [facility]: Mild plaque in both carotids.  139% bilateral.  Right vertebral flow normal antegrade, Left not seen; normal bilateral subclavian flow..  . CHOLECYSTECTOMY    . CORONARY ARTERY BYPASS GRAFT N/A 08/25/2019   Procedure: CORONARY ARTERY BYPASS GRAFTING (CABG) TIMES FOUR, ON PUMP, USING LEFT AND RIGHT INTERNAL MAMMARY ARTERY AND LEFT RADIAL ARTERY;  Surgeon: [name], MD;  Location: [place];  Service: Open Heart Surgery;  Laterality: N/A;  . CORONARY STENT INTERVENTION  04/10/2015   ([place]; Bishop Limbo, DO) -> DES PCI mLPDA: Xience Alpine DES 2.5 mm x 18 mm, Xience Alpine DES 2.25 mm x 12 mm overlapping.  . CORONARY STENT INTERVENTION  04/04/2016   ([place]; Opdyke West,  MD)--> (urgent) 100% mLPDA PTCA with 2.25 mm balloon - reduced to 50%; mid OM2 90% - DES PCI (Xience DES  2.5 x 18).   . CORONARY STENT INTERVENTION  2015   Prior to December 2016, STENTS noted in prox LAD, proximal OM1, and at least 2 stents in proximal L PDA  . CORONARY STENT INTERVENTION  11/17/2018   ([place]; Bishop Limbo, DO): CULPRIT: mid 90% (DES PCI) -Resolute Onyx DES 2.5 mm x 30 mm --> COMPLICATION: Large left arm hematoma-evaluated by vascular and orthopedic surgery, managed with splint and arm elevation.  . CORONARY STENT INTERVENTION  12/28/2018   ([place]; Clarene Critchley, MD, referred by Dr. Mathis Bud) --> INDICATION (urgent, Unstable Angina): Moderate-severe (70%) stenosis of ostial RCA  -> DES PCI Resolute Onyx DES 2.5 mm x 12 mm.   Marland Kitchen LEFT HEART CATH AND CORONARY ANGIOGRAPHY  04/10/2015   ([place]; Bishop Limbo, DO)--> EF 55%.  Mild inferior  HK.  Coronaries-LM: Normal, p LAD STENT 30% ISR, mLAD 20% & diffuse dLAD ~20%; Dom LCx: mCx 20%, ~pOM1 STENT (noted as OM2 in other reports) patent with mid 30%, OM2 normal, prox LPDA "STENTS" patent with SEVERE mid L PDA 90-95% (DES PCI x 2 overlapping); Small-non-dominant RCA  patent   . LEFT HEART CATH AND CORONARY ANGIOGRAPHY  04/04/2016   ([place]; Graham, MD)--> (urgent): EF 70%.  Previous LAD,~OM2 and proximal PDA stents patent; -> mLAD 35%, dLAD 40%; LCx-OM1 75% (short lesion, med Rx), mid OM2 90% (DES PCI), pLPDA stents patent w/ mPDA 100% (PTCA only - reduced to 50%); non-dom RCA - ost RCA 60% & mRCA 75%  . LEFT HEART CATH AND CORONARY ANGIOGRAPHY  11/17/2018   ([place]; Bishop Limbo, DO) indication: Angina.  LM normal; LAD - ost LAD ~50%, pLAD STENT ~30% ISR with dLAD ~40%; Dom LCx - ost & prox Cx 30%, OM1 STENT 20% ISR, OM2 STENT 50% distal edge, pLPDA overlapping STENTS ~20%; nonDom RCA - proxRCA 40%, mid 90% (DES PCI)  . LEFT HEART CATH AND CORONARY  ANGIOGRAPHY  12/28/2018   ([place]; Clarene Critchley, MD, referred by Dr. Mathis Bud) --> INDICATION (urgent, Unstable Angina): Moderate-severe (70%) stenosis of ostial RCA (DES PCI), mRCA 30%.  Otherwise no significant change from July 2020: dLM 20%, pLAD ~40% ISR , dLAD long/diffuse ~50%; OstLCx 30%, OM1 ~15% OM2 ~30% with patent  LPDA stents/PTCA site.   Marland Kitchen LEFT HEART CATH AND CORONARY ANGIOGRAPHY N/A 08/20/2019   Procedure: LEFT HEART CATH AND CORONARY ANGIOGRAPHY;  Surgeon: Leonie Man, MD;  Location: [place] [facility];  Service: Cardiovascular;  Laterality: N/A;  . NEPHRECTOMY Right 2012   With adrenalectomy  . NM MYOVIEW LTD  08/20/2018   [facility] -> Lexiscan Myoview: EF 81%.  No reversible ischemia or infarction.  Normal wall motion.  Marland Kitchen PACEMAKER IMPLANT  06/2016   [facility] [place] ([employer]) -> according to CT of chest, leads positioned in right atrium, cardiac apex and coronary sinus (suggesting CRT-P - BiV Pacing))  . RADIAL ARTERY HARVEST Left 08/25/2019   Procedure: RADIAL ARTERY HARVEST;  Surgeon: [name], MD;  Location: [place];  Service: Open Heart Surgery;  Laterality: Left;  . TEE WITHOUT CARDIOVERSION N/A 08/25/2019   Procedure: TRANSESOPHAGEAL ECHOCARDIOGRAM (TEE);  Surgeon: [name], MD;  Location: [place];  Service: Open Heart Surgery;  Laterality: N/A;  . TRANSTHORACIC ECHOCARDIOGRAM  11/2018; 05/01/2019   ([place]) a) EF 60-65%.  Mild TR.;; b)moderate concentric LVH.  EF 55 to 60%.  No R WMA.  Normal RV size and function.  Normal atrial sizes.  Normal valves.     Current Outpatient Medications  Medication Sig Dispense Refill  . albuterol (VENTOLIN HFA) 108 (90 Base) MCG/ACT inhaler Inhale 2 puffs into the lungs every 4 (four) hours as needed for wheezing or shortness of breath.    Marland Kitchen aspirin EC 81 MG EC tablet Take 1 tablet (81 mg total) by mouth daily.    Marland Kitchen atorvastatin (LIPITOR) 40 MG  tablet Take 40 mg by mouth daily.    . calcium-vitamin D (OSCAL WITH D) 500-200 MG-UNIT tablet Take 1 tablet by mouth daily. 50 MCG (2000UNIT ) TAB CHOLECALCIFEROL     . clopidogrel (PLAVIX) 75 MG tablet Take 75 mg by mouth daily.    . colchicine 0.6 MG tablet Take 0.5 tablets (0.3 mg total) by mouth 2 (two) times  daily. 60 tablet 0  . fluorouracil (EFUDEX) 5 % cream Apply 1 application topically 2 (two) times daily as needed (irritation).     . furosemide (LASIX) 40 MG tablet Take 1 tablet (40 mg total) by mouth daily. 7 tablet 0  . HYDROcodone-homatropine (HYCODAN) 5-1.5 MG/5ML syrup Take 5 mLs by mouth every 6 (six) hours as needed for cough. 120 mL 0  . hydrocortisone 2.5 % lotion Apply 1 application topically 2 (two) times daily as needed (irritation).     . insulin glargine (LANTUS SOLOSTAR) 100 UNIT/ML Solostar Pen Inject 50 Units into the skin every evening. 100UNITS/3 ML     . losartan (COZAAR) 50 MG tablet Take 50 mg by mouth daily.    Marland Kitchen MAGNESIUM OXIDE PO Take 500 mg by mouth daily. TAKE 1  TABLET BY MOUTH NIGHTLY FOR LEG CRAMPS     . metFORMIN (GLUCOPHAGE) 500 MG tablet Take 500 mg by mouth 2 (two) times daily with a meal.    . metoprolol tartrate (LOPRESSOR) 50 MG tablet Take 1 tablet (50 mg total) by mouth 2 (two) times daily. 180 tablet 3  . NITROSTAT 0.4 MG SL tablet Place 0.4 mg under the tongue every 5 (five) minutes as needed for chest pain.     . Omega-3 Fatty Acids (FISH OIL BURP-LESS) 1000 MG CAPS Take 1,000 mg by mouth 2 (two) times daily.    . ondansetron (ZOFRAN) 8 MG tablet Take 1 tablet (8 mg total) by mouth every 8 (eight) hours as needed for nausea or vomiting. 20 tablet 0  . pantoprazole (PROTONIX) 40 MG tablet Take 40 mg by mouth 2 (two) times daily before a meal.     . potassium chloride 20 MEQ TBCR Take 20 mEq by mouth daily. 7 tablet 0  . traMADol (ULTRAM) 50 MG tablet Place 1 tablet (50 mg total) into feeding tube every 4 (four) hours as needed for moderate pain.  30 tablet 0  . triamcinolone cream (KENALOG) 0.1 % Apply 1 application topically 2 (two) times daily as needed (irritation).     . VOLTAREN 1 % GEL Apply 2 g topically daily as needed (pain).     Marland Kitchen warfarin (COUMADIN) 5 MG tablet Take 5-7.5 mg by mouth See admin instructions. 5 mg on Tues, 7.5 mg all other days    . isosorbide mononitrate (IMDUR) 30 MG 24 hr tablet Take 0.5 tablets (15 mg total) by mouth daily. 45 tablet 3   No current facility-administered medications for this visit.    Allergies:   Latex, Codeine, Contrast media [iodinated diagnostic agents], Integrilin [eptifibatide], Tylenol [acetaminophen], Zithromax [azithromycin], and Glipizide    Social History:  The patient  reports that he has never smoked. He has never used smokeless tobacco. He reports that he does not drink alcohol or use drugs.   Family History:  The patient's family history includes Coronary artery disease in his father; Hyperlipidemia in his father and mother; Hypertension in his father and mother; Stroke in his mother.    ROS: All other systems are reviewed and negative. Unless otherwise mentioned in H&P    PHYSICAL EXAM: VS:  BP 124/72   Pulse (!) 104   Ht 5\' 8"  (1.727 m)   Wt 206 lb 12.8 oz (93.8 kg)   BMI 31.44 kg/m  , BMI Body mass index is 31.44 kg/m. GEN: Well nourished, well developed, in no acute distress HEENT: normal Neck: no JVD, carotid bruits, or masses Cardiac: RRR, tachycardic; no murmurs, rubs, or  gallops,no edema  Respiratory:  Clear to auscultation bilaterally, normal work of breathing GI: soft, nontender, nondistended, + BS MS: no deformity or atrophy.  Well-healed sternotomy incision, he still has some Band-Aids across the upper abdomen from chest tubes.  Left forearm incision is healing well. Skin: warm and dry, no rash Neuro:  Strength and sensation are intact Psych: euthymic mood, full affect   EKG: Atrial sensed ventricular paced rhythm heart rate of 104 bpm. Recent  Labs: 08/25/2019: ALT 25 08/27/2019: Magnesium 2.7 08/30/2019: Hemoglobin 9.4; Platelets 153 08/31/2019: BUN 30; Creatinine, Ser 1.55; Potassium 3.7; Sodium 138    Lipid Panel    Component Value Date/Time   CHOL 122 08/24/2019 0331   TRIG 78 08/25/2019 2046   HDL 29 (L) 08/24/2019 0331   CHOLHDL 4.2 08/24/2019 0331   VLDL 29 08/24/2019 0331   LDLCALC 64 08/24/2019 0331      Wt Readings from Last 3 Encounters:  09/20/19 206 lb 12.8 oz (93.8 kg)  09/13/19 207 lb (93.9 kg)  09/06/19 213 lb (96.6 kg)      Other studies Reviewed: Cardiac Cath 08/20/2019  The left ventricular systolic function is normal. The left ventricular ejection fraction is 55-65% by visual estimate. LV end diastolic pressure is normal.LV end diastolic pressure is normal.  -------------------  There is stent from ostial to almost distal small nondominant RCA: Ost RCA to Mid RCA stent is diffusely 35% stenosed.  Ost Cx to Prox Cx lesion is 60% stenosed.  Ost LAD to Mid LAD stent is 10% stenosed. Mid LAD to Dist LAD lesion is 60% stenosed.  Previously placed 2nd Mrg stent (DES) is widely patent.  Lat 2nd Mrg lesion is 80% stenosed. Previously described.  Previously placed 1st LPL stent (DES) is widely patent.  LPAV lesion is 50% stenosed.  LPDA stent is 5% stenosed.   Multivessel CAD with essentially patent stents in proximal to mid LAD, proximal OM1, proximal LPL1, several stents in the L PDA as well as RCA that is a small nondominant vessel. No obvious lesions within stents noted.  Ostial LCx roughly 60%-->highly DFR positive with 0.84  Long mid LAD 60% after stent--highly DFR +0.67-0.73  Normal LVEDP  With significant LAD as well as ostial LCx disease--best course of action is CVTS consultation for CABG.  Echocardiogram 08/21/2019 1. Left ventricular ejection fraction, by estimation, is 55 to 60%. The  left ventricle has normal function. Left ventricular endocardial border  not optimally  defined to evaluate regional wall motion. There is moderate  concentric left ventricular  hypertrophy. Left ventricular diastolic parameters are consistent with  Grade I diastolic dysfunction (impaired relaxation).  2. Right ventricular systolic function is normal. The right ventricular  size is normal. There is borderline elevated pulmonary artery systolic  pressure. The estimated right ventricular systolic pressure is [id number] mmHg.  3. The mitral valve is normal in structure. Trivial mitral valve  regurgitation. No evidence of mitral stenosis.  4. Tricuspid valve regurgitation is mild to moderate.  5. The aortic valve is normal in structure. Aortic valve regurgitation is  not visualized. No aortic stenosis is present.  6. The inferior vena cava is normal in size with greater than 50%  respiratory variability, suggesting right atrial pressure of 3 mmHg.   Treatments: CABG 08/25/2019   Coronary Artery Bypass Grafting x4 Left Internal Mammary Artery to Distal Left Anterior Descending Coronary Artery; left radialartery graft toleft posterior Descending Coronary Arterymid marginal Branch of Left Circumflex Coronary Arteryis a sequenced graft,pedicled right  internal mammary graft toramus intermediuscoronary Artery Bilateral internal mammary artery harvesting OpenLeft radial artery harvesting multilevellevel rib block Completion graft surveillance with indocyanine green fluorescence angiography(SPY)   ASSESSMENT AND PLAN:  1.  CAD: Status post cardiac catheterization 08/20/2019 revealing multivessel disease, multiple stents.  He is status post coronary artery bypass grafting, described above (LIMA to LAD, to distal LAD, left radial artery graft to left posterior, descending coronary artery and mid marginal branch of the left circumflex coronary artery with sequential graft, pedicle right internal mammary graft to ramus intermedius coronary artery.  He is recovering  well and has followed up with Dr. Orvan Seen.  He is due to see him again next week.  Heart rate is not well controlled today.  Running around 104 bpm per EKG, as well as on auscultation.  I am going to increase his metoprolol to 50 mg twice daily, change his isosorbide dinitrate to isosorbide mononitrate 15 mg daily to keep him from having some hypotension.  He will have reevaluation next week by CVTS.  He will continue on aspirin and Plavix as directed along with statin therapy.  2.  Hypertension: There is confusion concerning which medications he needs to be taking.  He was to be on losartan 50 mg daily, this was listed on discharge summary, and on follow-up note with Dr. Orvan Seen.  However the patient states that he is not taking it and has run out.  His blood pressure is controlled today.  It appears that he has been taking it.  He is can go home and double check everything to be sure he has a right medication listed at home.  It may be that he is not taking his metoprolol and has run out of that.  This is indicative of fast heart rate and therefore I want him to double check his medications at home.  If he is indeed taking metoprolol the higher dose may help with his heart rate.  He will call us and let us know and bring his medications with him on next visit.  3.  Hypercholesterolemia: He will need to continue atorvastatin 40 mg daily.  Goal of LDL less than 70.  Follow-up lipids and LFTs in 3 months.  4. Hx of PE: Continues on coumadin therapy. Followed by PCP  5. Pacemaker in situ: Followed in [facility]  Current medicines are reviewed at length with the patient today.  I have spent 45 dedicated to the care of this patient on the date of this encounter to include pre-visit review of records, assessment, management and diagnostic testing,with shared decision making.  Labs/ tests ordered today include: None  Phill Myron. West Pugh, ANP, AACC   09/20/2019 4:31 PM    [address] Office [phone] Fax [phone]  Notice: This dictation was prepared with Dragon dictation along with smaller phrase technology. Any transcriptional errors that result from this process are unintentional and may not be corrected upon review.

## 2019-09-20 ENCOUNTER — Ambulatory Visit: Payer: Medicare Other | Admitting: Adult Health

## 2019-09-20 ENCOUNTER — Encounter: Payer: Self-pay | Admitting: Adult Health

## 2019-09-20 ENCOUNTER — Other Ambulatory Visit: Payer: Self-pay

## 2019-09-20 VITALS — BP 124/72 | HR 104 | Ht 68.0 in | Wt 206.8 lb

## 2019-09-20 DIAGNOSIS — I2510 Atherosclerotic heart disease of native coronary artery without angina pectoris: Secondary | ICD-10-CM | POA: Diagnosis not present

## 2019-09-20 DIAGNOSIS — Z951 Presence of aortocoronary bypass graft: Secondary | ICD-10-CM

## 2019-09-20 DIAGNOSIS — Z9861 Coronary angioplasty status: Secondary | ICD-10-CM | POA: Diagnosis not present

## 2019-09-20 DIAGNOSIS — Z950 Presence of cardiac pacemaker: Secondary | ICD-10-CM

## 2019-09-20 DIAGNOSIS — G4733 Obstructive sleep apnea (adult) (pediatric): Secondary | ICD-10-CM | POA: Diagnosis not present

## 2019-09-20 DIAGNOSIS — I10 Essential (primary) hypertension: Secondary | ICD-10-CM | POA: Diagnosis not present

## 2019-09-20 DIAGNOSIS — E785 Hyperlipidemia, unspecified: Secondary | ICD-10-CM | POA: Diagnosis not present

## 2019-09-20 MED ORDER — METOPROLOL TARTRATE 50 MG PO TABS: 50.0000 mg | ORAL_TABLET | Freq: Two times a day (BID) | ORAL | 3 refills | Status: DC

## 2019-09-20 MED ORDER — ISOSORBIDE MONONITRATE ER 30 MG PO TB24
15.0000 mg | ORAL_TABLET | Freq: Every day | ORAL | 3 refills | Status: DC
Start: 2019-09-20 — End: 2019-11-25

## 2019-09-20 NOTE — Patient Instructions (Signed)
Medication Instructions:  STOP- Isosorbide Dinitrate START- Isosorbide Mononitrate 15 mg by mouth daily INCREASE- Metoprolol Tartrate 50 mg by mouth twice a day  *If you need a refill on your cardiac medications before your next appointment, please call your pharmacy*   Lab Work: None Ordered   Testing/Procedures: None Ordered   Follow-Up: At [facility], you and your health needs are our priority.  As part of our continuing mission to provide you with exceptional heart care, we have created designated Provider Care Teams.  These Care Teams include your primary Cardiologist (physician) and Advanced Practice Providers (APPs -  Physician Assistants and Nurse Practitioners) who all work together to provide you with the care you need, when you need it.  We recommend signing up for the patient portal called "MyChart".  Sign up information is provided on this After Visit Summary.  MyChart is used to connect with patients for Virtual Visits (Telemedicine).  Patients are able to view lab/test results, encounter notes, upcoming appointments, etc.  Non-urgent messages can be sent to your provider as well.   To learn more about what you can do with MyChart, go to [email or web address].    Your next appointment:   3 month(s)  The format for your next appointment:   In Person  Provider:   You may see Glenetta Hew, MD or one of the following Advanced Practice Providers on your designated Care Team:    Rosaria Ferries, PA-C  Jory Sims, DNP, ANP  Cadence Kathlen Mody, NP

## 2019-09-29 ENCOUNTER — Other Ambulatory Visit: Payer: Self-pay | Admitting: Cardiothoracic Surgery

## 2019-09-29 DIAGNOSIS — Z951 Presence of aortocoronary bypass graft: Secondary | ICD-10-CM

## 2019-09-30 ENCOUNTER — Ambulatory Visit
Admission: RE | Admit: 2019-09-30 | Discharge: 2019-09-30 | Disposition: A | Discharge status: Home / Self Care | Payer: Medicare Other | Source: Ambulatory Visit | Attending: Cardiothoracic Surgery | Admitting: Cardiothoracic Surgery

## 2019-09-30 ENCOUNTER — Other Ambulatory Visit: Payer: Self-pay

## 2019-09-30 ENCOUNTER — Encounter: Payer: Self-pay | Admitting: Cardiothoracic Surgery

## 2019-09-30 ENCOUNTER — Ambulatory Visit (INDEPENDENT_AMBULATORY_CARE_PROVIDER_SITE_OTHER): Payer: Self-pay | Admitting: Cardiothoracic Surgery

## 2019-09-30 VITALS — BP 131/85 | HR 88 | Resp 18 | Ht 68.0 in | Wt 200.0 lb

## 2019-09-30 DIAGNOSIS — Z951 Presence of aortocoronary bypass graft: Secondary | ICD-10-CM

## 2019-10-01 NOTE — Progress Notes (Signed)
[address]       [place] [address]             [phone]     CARDIOTHORACIC SURGERY OFFICE NOTE  Referring Provider is Leonie Man, MD Primary Cardiologist is Glenetta Hew, MD PCP is Kristopher Glee., MD   HPI:  59 yo man s/p CABG presents for 2nd postoperative visit. He states he's been doing much better with increased stamina and exercise capacity. Denies chest pain or shortness of breath.    Current Outpatient Medications  Medication Sig Dispense Refill  . albuterol (VENTOLIN HFA) 108 (90 Base) MCG/ACT inhaler Inhale 2 puffs into the lungs every 4 (four) hours as needed for wheezing or shortness of breath.    Marland Kitchen aspirin EC 81 MG EC tablet Take 1 tablet (81 mg total) by mouth daily.    Marland Kitchen atorvastatin (LIPITOR) 40 MG tablet Take 40 mg by mouth daily.    . calcium-vitamin D (OSCAL WITH D) 500-200 MG-UNIT tablet Take 1 tablet by mouth daily. 50 MCG (2000UNIT ) TAB CHOLECALCIFEROL     . clopidogrel (PLAVIX) 75 MG tablet Take 75 mg by mouth daily.    . colchicine 0.6 MG tablet Take 0.5 tablets (0.3 mg total) by mouth 2 (two) times daily. 60 tablet 0  . fluorouracil (EFUDEX) 5 % cream Apply 1 application topically 2 (two) times daily as needed (irritation).     . furosemide (LASIX) 40 MG tablet Take 1 tablet (40 mg total) by mouth daily. 7 tablet 0  . HYDROcodone-homatropine (HYCODAN) 5-1.5 MG/5ML syrup Take 5 mLs by mouth every 6 (six) hours as needed for cough. 120 mL 0  . hydrocortisone 2.5 % lotion Apply 1 application topically 2 (two) times daily as needed (irritation).     . insulin glargine (LANTUS SOLOSTAR) 100 UNIT/ML Solostar Pen Inject 50 Units into the skin every evening. 100UNITS/3 ML     . isosorbide mononitrate (IMDUR) 30 MG 24 hr tablet Take 0.5 tablets (15 mg total) by mouth daily. 45 tablet 3  . losartan (COZAAR) 50 MG tablet Take 50 mg by mouth daily.    Marland Kitchen MAGNESIUM OXIDE PO Take 500 mg by mouth daily. TAKE 1  TABLET BY MOUTH NIGHTLY  FOR LEG CRAMPS     . metFORMIN (GLUCOPHAGE) 500 MG tablet Take 500 mg by mouth 2 (two) times daily with a meal.    . metoprolol tartrate (LOPRESSOR) 50 MG tablet Take 1 tablet (50 mg total) by mouth 2 (two) times daily. 180 tablet 3  . NITROSTAT 0.4 MG SL tablet Place 0.4 mg under the tongue every 5 (five) minutes as needed for chest pain.     . Omega-3 Fatty Acids (FISH OIL BURP-LESS) 1000 MG CAPS Take 1,000 mg by mouth 2 (two) times daily.    . ondansetron (ZOFRAN) 8 MG tablet Take 1 tablet (8 mg total) by mouth every 8 (eight) hours as needed for nausea or vomiting. 20 tablet 0  . pantoprazole (PROTONIX) 40 MG tablet Take 40 mg by mouth 2 (two) times daily before a meal.     . potassium chloride 20 MEQ TBCR Take 20 mEq by mouth daily. 7 tablet 0  . traMADol (ULTRAM) 50 MG tablet Place 1 tablet (50 mg total) into feeding tube every 4 (four) hours as needed for moderate pain. 30 tablet 0  . triamcinolone cream (KENALOG) 0.1 % Apply 1 application topically 2 (two) times daily as needed (irritation).     Marland Kitchen  VOLTAREN 1 % GEL Apply 2 g topically daily as needed (pain).     Marland Kitchen warfarin (COUMADIN) 5 MG tablet Take 5-7.5 mg by mouth See admin instructions. 5 mg on Tues, 7.5 mg all other days     No current facility-administered medications for this visit.      Physical Exam:   BP 131/85 (BP Location: Right Arm, Patient Position: Sitting, Cuff Size: Large)   Pulse 88   Resp 18   Ht 5\' 8"  (1.727 m)   Wt 90.7 kg   SpO2 96% Comment: RA  BMI 30.41 kg/m   General:  Well-appearing, NAD  Chest:   cta  CV:   rrr  Incisions:  C/d/i  Abdomen:  sntnd  Extremities:  Mild edema  Diagnostic Tests:  CXR with clear lung fields   Impression:  Doing well after CABG  Plan:  Doing well after CABG F/u as needed with thoracic surgery Ok to liberalize activity   I spent in excess of 20 minutes during the conduct of this office consultation and >50% of this time involved direct face-to-face  encounter with the patient for counseling and/or coordination of their care.  Level 2                 10 minutes Level 3                 15 minutes Level 4                 25 minutes Level 5                 40 minutes  B. Murvin Natal, MD 10/01/2019 1:11 PM

## 2019-11-23 ENCOUNTER — Telehealth: Payer: Self-pay | Admitting: Cardiology

## 2019-11-23 ENCOUNTER — Encounter (HOSPITAL_COMMUNITY): Payer: Self-pay | Admitting: Emergency Medicine

## 2019-11-23 ENCOUNTER — Emergency Department (HOSPITAL_COMMUNITY): Payer: Medicare Other

## 2019-11-23 ENCOUNTER — Other Ambulatory Visit: Payer: Self-pay

## 2019-11-23 ENCOUNTER — Observation Stay (HOSPITAL_COMMUNITY)
Admission: EM | Admit: 2019-11-23 | Discharge: 2019-11-25 | Disposition: A | Discharge status: Home / Self Care | Payer: Medicare Other | Attending: Internal Medicine | Admitting: Internal Medicine

## 2019-11-23 DIAGNOSIS — R072 Precordial pain: Secondary | ICD-10-CM | POA: Diagnosis not present

## 2019-11-23 DIAGNOSIS — R079 Chest pain, unspecified: Secondary | ICD-10-CM | POA: Insufficient documentation

## 2019-11-23 DIAGNOSIS — N182 Chronic kidney disease, stage 2 (mild): Secondary | ICD-10-CM

## 2019-11-23 DIAGNOSIS — Z951 Presence of aortocoronary bypass graft: Secondary | ICD-10-CM | POA: Insufficient documentation

## 2019-11-23 DIAGNOSIS — Z794 Long term (current) use of insulin: Secondary | ICD-10-CM

## 2019-11-23 DIAGNOSIS — Z79899 Other long term (current) drug therapy: Secondary | ICD-10-CM | POA: Insufficient documentation

## 2019-11-23 DIAGNOSIS — R0602 Shortness of breath: Secondary | ICD-10-CM | POA: Insufficient documentation

## 2019-11-23 DIAGNOSIS — Z9861 Coronary angioplasty status: Secondary | ICD-10-CM

## 2019-11-23 DIAGNOSIS — R071 Chest pain on breathing: Secondary | ICD-10-CM | POA: Diagnosis present

## 2019-11-23 DIAGNOSIS — I160 Hypertensive urgency: Secondary | ICD-10-CM | POA: Diagnosis not present

## 2019-11-23 DIAGNOSIS — I25110 Atherosclerotic heart disease of native coronary artery with unstable angina pectoris: Secondary | ICD-10-CM | POA: Diagnosis not present

## 2019-11-23 DIAGNOSIS — E118 Type 2 diabetes mellitus with unspecified complications: Secondary | ICD-10-CM

## 2019-11-23 DIAGNOSIS — R69 Illness, unspecified: Secondary | ICD-10-CM

## 2019-11-23 DIAGNOSIS — Z85828 Personal history of other malignant neoplasm of skin: Secondary | ICD-10-CM | POA: Diagnosis not present

## 2019-11-23 DIAGNOSIS — Z20822 Contact with and (suspected) exposure to covid-19: Secondary | ICD-10-CM | POA: Insufficient documentation

## 2019-11-23 DIAGNOSIS — I5031 Acute diastolic (congestive) heart failure: Secondary | ICD-10-CM | POA: Diagnosis not present

## 2019-11-23 DIAGNOSIS — E1169 Type 2 diabetes mellitus with other specified complication: Secondary | ICD-10-CM | POA: Diagnosis present

## 2019-11-23 DIAGNOSIS — Z8679 Personal history of other diseases of the circulatory system: Secondary | ICD-10-CM

## 2019-11-23 DIAGNOSIS — Z7901 Long term (current) use of anticoagulants: Secondary | ICD-10-CM | POA: Diagnosis not present

## 2019-11-23 DIAGNOSIS — Z91040 Latex allergy status: Secondary | ICD-10-CM | POA: Insufficient documentation

## 2019-11-23 DIAGNOSIS — E1140 Type 2 diabetes mellitus with diabetic neuropathy, unspecified: Secondary | ICD-10-CM | POA: Insufficient documentation

## 2019-11-23 DIAGNOSIS — R0789 Other chest pain: Secondary | ICD-10-CM | POA: Diagnosis present

## 2019-11-23 DIAGNOSIS — Z950 Presence of cardiac pacemaker: Secondary | ICD-10-CM | POA: Diagnosis not present

## 2019-11-23 DIAGNOSIS — Z7982 Long term (current) use of aspirin: Secondary | ICD-10-CM | POA: Diagnosis not present

## 2019-11-23 DIAGNOSIS — I10 Essential (primary) hypertension: Secondary | ICD-10-CM | POA: Diagnosis present

## 2019-11-23 DIAGNOSIS — I2510 Atherosclerotic heart disease of native coronary artery without angina pectoris: Secondary | ICD-10-CM

## 2019-11-23 DIAGNOSIS — E119 Type 2 diabetes mellitus without complications: Secondary | ICD-10-CM | POA: Insufficient documentation

## 2019-11-23 DIAGNOSIS — E785 Hyperlipidemia, unspecified: Secondary | ICD-10-CM | POA: Diagnosis not present

## 2019-11-23 DIAGNOSIS — G4733 Obstructive sleep apnea (adult) (pediatric): Secondary | ICD-10-CM | POA: Diagnosis present

## 2019-11-23 LAB — BASIC METABOLIC PANEL
Anion gap: 12 (ref 5–15)
BUN: 22 mg/dL — ABNORMAL HIGH (ref 6–20)
CO2: 24 mmol/L (ref 22–32)
Calcium: 9.5 mg/dL (ref 8.9–10.3)
Chloride: 104 mmol/L (ref 98–111)
Creatinine, Ser: 1.53 mg/dL — ABNORMAL HIGH (ref 0.61–1.24)
GFR calc Af Amer: 58 mL/min — ABNORMAL LOW (ref >60)
GFR calc non Af Amer: 50 mL/min — ABNORMAL LOW (ref >60)
Glucose, Bld: 140 mg/dL — ABNORMAL HIGH (ref 70–99)
Potassium: 4.1 mmol/L (ref 3.5–5.1)
Sodium: 140 mmol/L (ref 135–145)

## 2019-11-23 LAB — CBC
HCT: 37.1 % — ABNORMAL LOW (ref 39.0–52.0)
Hemoglobin: 10.8 g/dL — ABNORMAL LOW (ref 13.0–17.0)
MCH: 23.4 pg — ABNORMAL LOW (ref 26.0–34.0)
MCHC: 29.1 g/dL — ABNORMAL LOW (ref 30.0–36.0)
MCV: 80.3 fL (ref 80.0–100.0)
Platelets: 214 10*3/uL (ref 150–400)
RBC: 4.62 MIL/uL (ref 4.22–5.81)
RDW: 14.8 % (ref 11.5–15.5)
WBC: 7.6 10*3/uL (ref 4.0–10.5)
nRBC: 0 % (ref 0.0–0.2)

## 2019-11-23 LAB — TROPONIN I (HIGH SENSITIVITY)
Troponin I (High Sensitivity): 20 ng/L — ABNORMAL HIGH (ref <18)
Troponin I (High Sensitivity): 19 ng/L — ABNORMAL HIGH (ref <18)

## 2019-11-23 LAB — CBG MONITORING, ED: Glucose-Capillary: 47 mg/dL — ABNORMAL LOW (ref 70–99)

## 2019-11-23 LAB — PROTIME-INR
INR: 2.6 — ABNORMAL HIGH (ref 0.8–1.2)
Prothrombin Time: 26.7 seconds — ABNORMAL HIGH (ref 11.4–15.2)

## 2019-11-23 MED ORDER — SODIUM CHLORIDE 0.9% FLUSH: 3.0000 mL | Freq: Once | INTRAVENOUS | Status: DC

## 2019-11-23 MED ORDER — LOSARTAN POTASSIUM 50 MG PO TABS
50.0000 mg | ORAL_TABLET | Freq: Once | ORAL | Status: AC
  Administered 2019-11-23: 50 mg via ORAL
  Filled 2019-11-23: qty 1

## 2019-11-23 MED ORDER — METOPROLOL TARTRATE 25 MG PO TABS
50.0000 mg | ORAL_TABLET | Freq: Once | ORAL | Status: AC
  Administered 2019-11-23: 50 mg via ORAL
  Filled 2019-11-23: qty 2

## 2019-11-23 MED ORDER — HYDRALAZINE HCL 20 MG/ML IJ SOLN
20.0000 mg | Freq: Once | INTRAMUSCULAR | Status: DC
  Filled 2019-11-23: qty 1

## 2019-11-23 MED ORDER — METOPROLOL TARTRATE 5 MG/5ML IV SOLN
5.0000 mg | Freq: Once | INTRAVENOUS | Status: AC
  Administered 2019-11-23: 5 mg via INTRAVENOUS
  Filled 2019-11-23: qty 5

## 2019-11-23 MED ORDER — HYDRALAZINE HCL 20 MG/ML IJ SOLN
20.0000 mg | Freq: Once | INTRAMUSCULAR | Status: AC
  Administered 2019-11-23: 20 mg via INTRAVENOUS
  Filled 2019-11-23: qty 1

## 2019-11-23 MED ORDER — FUROSEMIDE 10 MG/ML IJ SOLN
60.0000 mg | Freq: Once | INTRAMUSCULAR | Status: AC
  Administered 2019-11-23: 60 mg via INTRAVENOUS
  Filled 2019-11-23: qty 6

## 2019-11-23 MED ORDER — ISOSORBIDE MONONITRATE ER 30 MG PO TB24
30.0000 mg | ORAL_TABLET | Freq: Once | ORAL | Status: AC
  Administered 2019-11-23: 30 mg via ORAL
  Filled 2019-11-23: qty 1

## 2019-11-23 MED ORDER — DEXTROSE 50 % IV SOLN
INTRAVENOUS | Status: AC
  Filled 2019-11-23: qty 50

## 2019-11-23 MED ORDER — NITROGLYCERIN 2 % TD OINT
1.0000 [in_us] | TOPICAL_OINTMENT | Freq: Four times a day (QID) | TRANSDERMAL | Status: DC
  Administered 2019-11-23: 1 [in_us] via TOPICAL
  Filled 2019-11-23: qty 1

## 2019-11-23 NOTE — ED Notes (Addendum)
Pt's CBG 47, mentating appropriately, no additional complaints currently . Pt provided hard candy by family member, [place] sandwich & juice by NT Jesus. Will recheck sugar

## 2019-11-23 NOTE — Telephone Encounter (Signed)
Spoke with Caren Griffins, the patient is needing to be seen in the ER and he is wanting to come to [facility]. She is calling to let us know what has been going on. The patient has been having elevated bp since he was started on gout medication by his PCP. She reports bp today 198/101 and 210/108. He has pitting edema in his legs and feet but that is some better and his eyes are puffy. He is c/o substernal pain when he takes a deep breath. They called his medical doctor and they want him to go to the ER. ER and cardiac rehab notes are available in EPIC

## 2019-11-23 NOTE — Telephone Encounter (Signed)
Caren Griffins from [facility]

## 2019-11-23 NOTE — Consult Note (Addendum)
Cardiology Consult    Patient ID: John Dodson MRN: [id number], DOB/AGE: [birth year]   Admit date: 11/23/2019 Date of Consult: 11/23/2019  Primary Physician: John Glee., MD Primary Cardiologist: John Hew, MD Requesting Provider: Dr. Charlesetta Dodson  Patient Profile    John Dodson is a 59 y.o. male with a history of CKD 3, coronary artery disease status post coronary artery bypass grafting in April 2021 multiple prior PCI, complete heart block status post biventricular pacemaker, diabetes. He is being seen today for the evaluation of retention, shortness of breath, and chest pressure.   History of Present Illness    John Dodson is a 59 y.o. male with a history of CKD 3, coronary artery disease status post coronary artery bypass grafting in April 2021 multiple prior PCI, prior pulmonary embolism on warfarin, complete heart block status post biventricular pacemaker,, diabetes.  Presented to the ED with reports of a few days of hypertension, shortness of breath, and chest pressure.  He reports that since he was started on a medication for gout (I suspect colchicine), he has noticed that he has been having high blood pressure and shortness of breath.  He describes feeling like his breath is restricted and he cannot get a deep breath.  It is not clearly related to exertion but he admits that he has not really exerted himself much.  He reports some swelling of his lower extremities as well as some paroxysmal nocturnal dyspnea but no orthopnea.  The chest pressure is vague and not clearly anginal related to exertion.  ED his blood pressure is elevated to the 180/90 range.  He reports that he has stopped taking losartan as well as isosorbide mononitrate because he ran out of both.  Past Medical History   Past Medical History:  Diagnosis Date  . Blepharitis of both eyes    Chronic  . Cataracts, both eyes   . CKD (chronic kidney disease) stage 3, GFR 30-59 ml/min     Status post right nephrectomy/adrenalectomy and diabetic nephropathy (baseline creatinine 1.4-1.5)  . Coronary artery disease involving native coronary artery 2015   (No cath report prior to 2016 noted, but as of 2016, had stents in LAD, OM and L PDA) as documented since now in proximal LAD, OM 2-2 stents, L PDA at least 3 if not 4 stents, and also now nondominant RCA.  Marland Kitchen COVID-19 virus infection 04/25/2019  . Diabetic peripheral neuropathy associated with type 2 diabetes mellitus ([place])   . Essential hypertension   . GERD without esophagitis   . History of basal cell carcinoma (BCC) excision   . History of complete heart block 06/2016   Status post pacemaker placement  . History of non-ST elevation myocardial infarction (NSTEMI)    And several occasions of unstable angina  . Hyperlipidemia associated with type 2 diabetes mellitus [facility])    Per PCP note in February 2021, was on atorvastatin 40 mg.  . Obesity (BMI 30.0-34.9)   . Occlusion of left vertebral artery    Consider repossible acute lesion in August 2020 with presentation of headache.  CTA suggested occluded left vertebral artery throughout the neck.  Faint string-like enhancement intermittently visible suggesting recent occlusion.  Partial reconstruction and posterior fossa.  Left PICA patent.  (Consider possible left vertebral artery dissection)  . OSA treated with BiPAP 02/2019   Diagnosis in late 2020 ([facility])--> delayed onset of treatment with BiPAP due to Covid hospitalization followed by PE. ->  BiPAP setting at  21/17 cm  . Pulmonary embolism ([facility]) 05/18/2019   (1 month following COVID-19 infection): DVT-PE (bilateral PEs noted on VQ scan)-> started on warfarin.  (On warfarin plus Plavix now with aspirin stopped.)  . Type 2 diabetes mellitus with complication, with long-term current use of insulin (HCC)    On Lantus, Jardiance, Metformin & Onglyza    Past Surgical History:  Procedure Laterality Date  . BASAL CELL  CARCINOMA EXCISION  01/2015  . CAROTID DUPLEX SCAN  12/25/2018   [facility]: Mild plaque in both carotids.  139% bilateral.  Right vertebral flow normal antegrade, Left not seen; normal bilateral subclavian flow..  . CHOLECYSTECTOMY    . CORONARY ARTERY BYPASS GRAFT N/A 08/25/2019   Procedure: CORONARY ARTERY BYPASS GRAFTING (CABG) TIMES FOUR, ON PUMP, USING LEFT AND RIGHT INTERNAL MAMMARY ARTERY AND LEFT RADIAL ARTERY;  Surgeon: [name], MD;  Location: [place];  Service: Open Heart Surgery;  Laterality: N/A;  . CORONARY STENT INTERVENTION  04/10/2015   ([place]; Bishop Limbo, DO) -> DES PCI mLPDA: Xience Alpine DES 2.5 mm x 18 mm, Xience Alpine DES 2.25 mm x 12 mm overlapping.  . CORONARY STENT INTERVENTION  04/04/2016   ([place]; [place], MD)--> (urgent) 100% mLPDA PTCA with 2.25 mm balloon - reduced to 50%; mid OM2 90% - DES PCI (Xience DES  2.5 x 18).   . CORONARY STENT INTERVENTION  2015   Prior to December 2016, STENTS noted in prox LAD, proximal OM1, and at least 2 stents in proximal L PDA  . CORONARY STENT INTERVENTION  11/17/2018   ([place]; Bishop Limbo, DO): CULPRIT: mid 90% (DES PCI) -Resolute Onyx DES 2.5 mm x 30 mm --> COMPLICATION: Large left arm hematoma-evaluated by vascular and orthopedic surgery, managed with splint and arm elevation.  . CORONARY STENT INTERVENTION  12/28/2018   ([place]; Clarene Critchley, MD, referred by Dr. Mathis Bud) --> INDICATION (urgent, Unstable Angina): Moderate-severe (70%) stenosis of ostial RCA  -> DES PCI Resolute Onyx DES 2.5 mm x 12 mm.   Marland Kitchen LEFT HEART CATH AND CORONARY ANGIOGRAPHY  04/10/2015   ([place]; Bishop Limbo, DO)--> EF 55%.  Mild inferior HK.  Coronaries-LM: Normal, p LAD STENT 30% ISR, mLAD 20% & diffuse dLAD ~20%; Dom LCx: mCx 20%, ~pOM1 STENT (noted as OM2 in other reports) patent with mid 30%, OM2 normal, prox LPDA "STENTS" patent with SEVERE  mid L PDA 90-95% (DES PCI x 2 overlapping); Small-non-dominant RCA  patent   . LEFT HEART CATH AND CORONARY ANGIOGRAPHY  04/04/2016   ([place]; [place], MD)--> (urgent): EF 70%.  Previous LAD,~OM2 and proximal PDA stents patent; -> mLAD 35%, dLAD 40%; LCx-OM1 75% (short lesion, med Rx), mid OM2 90% (DES PCI), pLPDA stents patent w/ mPDA 100% (PTCA only - reduced to 50%); non-dom RCA - ost RCA 60% & mRCA 75%  . LEFT HEART CATH AND CORONARY ANGIOGRAPHY  11/17/2018   ([place]; Bishop Limbo, DO) indication: Angina.  LM normal; LAD - ost LAD ~50%, pLAD STENT ~30% ISR with dLAD ~40%; Dom LCx - ost & prox Cx 30%, OM1 STENT 20% ISR, OM2 STENT 50% distal edge, pLPDA overlapping STENTS ~20%; nonDom RCA - proxRCA 40%, mid 90% (DES PCI)  . LEFT HEART CATH AND CORONARY ANGIOGRAPHY  12/28/2018   ([place]; Clarene Critchley, MD, referred by Dr. Mathis Bud) --> INDICATION (urgent, Unstable Angina): Moderate-severe (70%) stenosis of ostial RCA (DES  PCI), mRCA 30%.  Otherwise no significant change from July 2020: dLM 20%, pLAD ~40% ISR , dLAD long/diffuse ~50%; OstLCx 30%, OM1 ~15% OM2 ~30% with patent  LPDA stents/PTCA site.   Marland Kitchen LEFT HEART CATH AND CORONARY ANGIOGRAPHY N/A 08/20/2019   Procedure: LEFT HEART CATH AND CORONARY ANGIOGRAPHY;  Surgeon: Leonie Man, MD;  Location: [place] [facility];  Service: Cardiovascular;  Laterality: N/A;  . NEPHRECTOMY Right 2012   With adrenalectomy  . NM MYOVIEW LTD  08/20/2018   [facility] -> Lexiscan Myoview: EF 81%.  No reversible ischemia or infarction.  Normal wall motion.  Marland Kitchen PACEMAKER IMPLANT  06/2016   [facility],  ([employer]) -> according to CT of chest, leads positioned in right atrium, cardiac apex and coronary sinus (suggesting CRT-P - BiV Pacing))  . RADIAL ARTERY HARVEST Left 08/25/2019   Procedure: RADIAL ARTERY HARVEST;  Surgeon: [name], MD;  Location: [place];  Service: Open  Heart Surgery;  Laterality: Left;  . TEE WITHOUT CARDIOVERSION N/A 08/25/2019   Procedure: TRANSESOPHAGEAL ECHOCARDIOGRAM (TEE);  Surgeon: [name], MD;  Location: [place];  Service: Open Heart Surgery;  Laterality: N/A;  . TRANSTHORACIC ECHOCARDIOGRAM  11/2018; 05/01/2019   ([place]) a) EF 60-65%.  Mild TR.;; b)moderate concentric LVH.  EF 55 to 60%.  No R WMA.  Normal RV size and function.  Normal atrial sizes.  Normal valves.     Allergies  Allergen Reactions  . Latex Rash  . Codeine Nausea And Vomiting  . Contrast Media [Iodinated Diagnostic Agents]     Reportedly cardiac arrest  . Integrilin [Eptifibatide]     Reportedly had shortness of breath, confusion.  . Tylenol [Acetaminophen]     Tylenol -3 with codiene  . Zithromax [Azithromycin] Nausea And Vomiting  . Glipizide Rash    Headache   Inpatient Medications    . dextrose      . nitroGLYCERIN  1 inch Topical Q6H  . sodium chloride flush  3 mL Intravenous Once    Family History    Family History  Problem Relation Age of Onset  . Stroke Mother   . Hypertension Mother   . Hyperlipidemia Mother   . Hypertension Father   . Hyperlipidemia Father   . Coronary artery disease Father    He indicated that the status of his mother is unknown. He indicated that the status of his father is unknown.   Social History    Social History   Socioeconomic History  . Marital status: Married    Spouse name: Not on file  . Number of children: Not on file  . Years of education: Not on file  . Highest education level: Not on file  Occupational History  . Not on file  Tobacco Use  . Smoking status: Never Smoker  . Smokeless tobacco: Never Used  Vaping Use  . Vaping Use: Never used  Substance and Sexual Activity  . Alcohol use: No  . Drug use: No  . Sexual activity: Not on file  Other Topics Concern  . Not on file  Social History Narrative  . Not on file   Social Determinants of Health    Financial Resource Strain:   . Difficulty of Paying Living Expenses:   Food Insecurity:   . Worried About Charity fundraiser in the Last Year:   . [place] in the Last Year:   Transportation Needs:   . Lack of  Transportation (Medical):   Marland Kitchen Lack of Transportation (Non-Medical):   Physical Activity:   . Days of Exercise per Week:   . Minutes of Exercise per Session:   Stress:   . Feeling of Stress :   Social Connections:   . Frequency of Communication with Friends and Family:   . Frequency of Social Gatherings with Friends and Family:   . Attends Religious Services:   . Active Member of Clubs or Organizations:   . Attends Archivist Meetings:   Marland Kitchen Marital Status:   Intimate Partner Violence:   . Fear of Current or Ex-Partner:   . Emotionally Abused:   Marland Kitchen Physically Abused:   . Sexually Abused:      Review of Systems    General:  No chills, fever, night sweats or weight changes.  Cardiovascular:  chest pain, dyspnea on exertion, edema, orthopnea, palpitations, paroxysmal nocturnal dyspnea. Dermatological: No rash, lesions/masses Respiratory: No cough, dyspnea Urologic: No hematuria, dysuria Abdominal:   No nausea, vomiting, diarrhea, bright red blood per rectum, melena, or hematemesis Neurologic:  No visual changes, wkns, changes in mental status. All other systems reviewed and are otherwise negative except as noted above.  Physical Exam    Blood pressure (!) 181/80, pulse 75, temperature 98.3 F (36.8 C), temperature source Oral, resp. rate 19, SpO2 94 %.    No intake or output data in the 24 hours ending 11/23/19 2123 Wt Readings from Last 3 Encounters:  09/30/19 90.7 kg  09/20/19 93.8 kg  09/13/19 93.9 kg    CONSTITUTIONAL: alert and conversant, well-appearing, nourished, no distress HEENT: oropharynx clear and moist, no mucosal lesions, normal dentition, conjunctiva normal, EOM intact, pupils equal, no lid lag. NECK: supple, no cervical  adenopathy, no thyromegaly CARDIOVASCULAR: Regular rhythm. No gallop, murmur, or rub. Normal S1/S2. Radial pulses intact. JVP centimeters of water and augments with HJR. No carotid bruits. PULMONARY/CHEST WALL: no deformities, normal breath sounds bilaterally, normal work of breathing ABDOMINAL: soft, non-tender, non-distended EXTREMITIES: 1+ bilateral lower extremity edema, warm and well-perfused SKIN: Dry and intact without apparent rashes or wounds.  NEUROLOGIC: alert, normal gait, no abnormal movements, cranial nerves grossly intact.   Labs    Troponin IX 19, 20 GFR 50 Hemoglobin 10.8 INR 2.6  Radiology Studies  X-ray reviewed no clear infiltrate, bibasilar atelectasis, some cephalization, appropriate biventricular pacemaker.  ECG & Cardiac Imaging    Reviewed.  Normal sinus rhythm with ventricular pacing.  Unchanged from prior. Reviewed echo postsurgically with preserved LVEF and RV normal size. Electrocardiogram in April 2012 With LVEF 55 to 60%, moderate concentric LVH with grade 1 diastolic dysfunction, RV normal size and function with borderline elevated PASP of 35 mmHg.  No significant valvular abnormalities.  We will 2021 CABG with LIMA to distal LAD, left radial graft left PDA circuit to OM 2, RIMA to ramus Assessment & Plan    59 year old male with a history of coronary artery bypass grafting in April 2021 (LIMA to distal LAD, left radial graft to PDA, OM 2, RIMA to ramus intermedius), multiple prior PCI's, complete heart block status post biventricular pacemaker, hypertension, diabetes, and gout who presents with hypertensive urgency with symptoms of chest pressure and shortness of breath.  Exam is significant for elevated filling pressures and mild peripheral edema.  Troponin is very mildly elevated at 19 and 20 in the context of CKD and known extensive coronary disease reassuring for lack of acute coronary syndrome.  ECG reviewed with biventricular pacing.  Likely  precipitant  of the severe hypertension include medication nonadherence with recent discontinuation of losartan as well as hyperkalemia in the setting of the element of heart failure with preserved ejection fraction.  Recommend resumption of home antihypertensives and trial of parenteral diuretics for symptomatic improvement in blood pressure control.  Problem list Hypertensive urgency Acute diastolic heart failure History of coronary artery disease started status post CABG, multiple prior PCI History of complete heart block status post biventricular pacemaker Type 2 diabetes Hypertension Obesity  Recommendations Hypertensive urgency Acute diastolic heart failure - Resume home losartan 50 mg and titrate to maximal dose.  Blood blood pressure less than 130/80 milligrams mercury - IV furosemide 60 mg x 1 and evaluate response.  Suspect he will need maintenance loop diuretic as outpatient. - Monitor renal function, potassium after reinitiation of losartan CAD.  If he tolerates and GFR remains greater than 45 then he may benefit from spironolactone. -As needed hydralazine for systolic blood pressure greater than 160 -Resume home isosorbide mononitrate 30 mg daily and can titrate upwards as tolerated once ARB maximized.  -From his perspective of type 2 diabetes, CAD, and heart failure preserved ejection fraction, likely to benefit from SGLT2 inhibitor  Coronary artery disease status post CABG and multiple prior PCI -Continue aspirin 81 mg daily and warfarin which he remains on for his history of pulmonary embolism -Stop Plavix without clear indication for triple therapy -Maximize statin at 80 mg daily with goal LDL less than 70 mg/dL  Signed, Delight Hoh, MD 11/23/2019, 9:23 PM  For questions or updates, please contact   Please consult [email or web address] for contact info under Cardiology/STEMI.

## 2019-11-23 NOTE — H&P (Signed)
PCP:   Kristopher Glee., MD   Chief Complaint:    HPI: This a 59 year old male who has a history of CAD, status post CABG 4/21.  Today he was at cardiac rehab, his blood pressure was checked and found to be elevated.  It was repeated manually and his blood pressure was 212/109.  The patient complained of chest pain whenever he took a deep breath.  He had previously noted his blood pressure was elevated.  He states his blood pressure has been resting really high for the past few days.  His nurse called his PCP and he was directed to the sent to the ER.  The patient denies any radiation for chest pain.  He denies any diaphoresis, lightheadedness or dizziness.  He denies any shortness of breath but reports he has some palpitation.  In the ER the patient blood pressure also similarly elevated.  He is received p.o. and IV antibiotics.  His blood pressure remains elevated.  The hospitalist was asked to admit.  Cardiology saw the patient.  Recommendations were made.  Per cardiology note the patient stopped his losartan and Imdur because the redness of both.   Review of Systems:  The patient denies anorexia, fever, weight loss,, vision loss, decreased hearing, hoarseness, chest pain, syncope, dyspnea on exertion, peripheral edema, balance deficits, hemoptysis, abdominal pain, melena, hematochezia, severe indigestion/heartburn, hematuria, incontinence, genital sores, muscle weakness, suspicious skin lesions, transient blindness, difficulty walking, depression, unusual weight change, abnormal bleeding, enlarged lymph nodes, angioedema, and breast masses.  Past Medical History: Past Medical History:  Diagnosis Date  . Blepharitis of both eyes    Chronic  . Cataracts, both eyes   . CKD (chronic kidney disease) stage 3, GFR 30-59 ml/min    Status post right nephrectomy/adrenalectomy and diabetic nephropathy (baseline creatinine 1.4-1.5)  . Coronary artery disease involving native coronary artery 2015    (No cath report prior to 2016 noted, but as of 2016, had stents in LAD, OM and L PDA) as documented since now in proximal LAD, OM 2-2 stents, L PDA at least 3 if not 4 stents, and also now nondominant RCA.  Marland Kitchen COVID-19 virus infection 04/25/2019  . Diabetic peripheral neuropathy associated with type 2 diabetes mellitus ([place])   . Essential hypertension   . GERD without esophagitis   . History of basal cell carcinoma (BCC) excision   . History of complete heart block 06/2016   Status post pacemaker placement  . History of non-ST elevation myocardial infarction (NSTEMI)    And several occasions of unstable angina  . Hyperlipidemia associated with type 2 diabetes mellitus [facility])    Per PCP note in February 2021, was on atorvastatin 40 mg.  . Obesity (BMI 30.0-34.9)   . Occlusion of left vertebral artery    Consider repossible acute lesion in August 2020 with presentation of headache.  CTA suggested occluded left vertebral artery throughout the neck.  Faint string-like enhancement intermittently visible suggesting recent occlusion.  Partial reconstruction and posterior fossa.  Left PICA patent.  (Consider possible left vertebral artery dissection)  . OSA treated with BiPAP 02/2019   Diagnosis in late 2020 ([facility])--> delayed onset of treatment with BiPAP due to Covid hospitalization followed by PE. ->  BiPAP setting at 21/17 cm  . Pulmonary embolism ([place]) 05/18/2019   (1 month following COVID-19 infection): DVT-PE (bilateral PEs noted on VQ scan)-> started on warfarin.  (On warfarin plus Plavix now with aspirin stopped.)  . Type 2 diabetes mellitus with  complication, with long-term current use of insulin (HCC)    On Lantus, Jardiance, Metformin & Onglyza   Past Surgical History:  Procedure Laterality Date  . BASAL CELL CARCINOMA EXCISION  01/2015  . CAROTID DUPLEX SCAN  12/25/2018   [facility]: Mild plaque in both carotids.  139% bilateral.  Right vertebral flow normal  antegrade, Left not seen; normal bilateral subclavian flow..  . CHOLECYSTECTOMY    . CORONARY ARTERY BYPASS GRAFT N/A 08/25/2019   Procedure: CORONARY ARTERY BYPASS GRAFTING (CABG) TIMES FOUR, ON PUMP, USING LEFT AND RIGHT INTERNAL MAMMARY ARTERY AND LEFT RADIAL ARTERY;  Surgeon: [name], MD;  Location: [place];  Service: Open Heart Surgery;  Laterality: N/A;  . CORONARY STENT INTERVENTION  04/10/2015   ([place]; Bishop Limbo, DO) -> DES PCI mLPDA: Xience Alpine DES 2.5 mm x 18 mm, Xience Alpine DES 2.25 mm x 12 mm overlapping.  . CORONARY STENT INTERVENTION  04/04/2016   ([place]; Jennings, MD)--> (urgent) 100% mLPDA PTCA with 2.25 mm balloon - reduced to 50%; mid OM2 90% - DES PCI (Xience DES  2.5 x 18).   . CORONARY STENT INTERVENTION  2015   Prior to December 2016, STENTS noted in prox LAD, proximal OM1, and at least 2 stents in proximal L PDA  . CORONARY STENT INTERVENTION  11/17/2018   ([place]; Bishop Limbo, DO): CULPRIT: mid 90% (DES PCI) -Resolute Onyx DES 2.5 mm x 30 mm --> COMPLICATION: Large left arm hematoma-evaluated by vascular and orthopedic surgery, managed with splint and arm elevation.  . CORONARY STENT INTERVENTION  12/28/2018   ([place]; Clarene Critchley, MD, referred by Dr. Mathis Bud) --> INDICATION (urgent, Unstable Angina): Moderate-severe (70%) stenosis of ostial RCA  -> DES PCI Resolute Onyx DES 2.5 mm x 12 mm.   Marland Kitchen LEFT HEART CATH AND CORONARY ANGIOGRAPHY  04/10/2015   ([place]; Bishop Limbo, DO)--> EF 55%.  Mild inferior HK.  Coronaries-LM: Normal, p LAD STENT 30% ISR, mLAD 20% & diffuse dLAD ~20%; Dom LCx: mCx 20%, ~pOM1 STENT (noted as OM2 in other reports) patent with mid 30%, OM2 normal, prox LPDA "STENTS" patent with SEVERE mid L PDA 90-95% (DES PCI x 2 overlapping); Small-non-dominant RCA  patent   . LEFT HEART CATH AND CORONARY ANGIOGRAPHY  04/04/2016   ([place];  [place], MD)--> (urgent): EF 70%.  Previous LAD,~OM2 and proximal PDA stents patent; -> mLAD 35%, dLAD 40%; LCx-OM1 75% (short lesion, med Rx), mid OM2 90% (DES PCI), pLPDA stents patent w/ mPDA 100% (PTCA only - reduced to 50%); non-dom RCA - ost RCA 60% & mRCA 75%  . LEFT HEART CATH AND CORONARY ANGIOGRAPHY  11/17/2018   ([place]; Bishop Limbo, DO) indication: Angina.  LM normal; LAD - ost LAD ~50%, pLAD STENT ~30% ISR with dLAD ~40%; Dom LCx - ost & prox Cx 30%, OM1 STENT 20% ISR, OM2 STENT 50% distal edge, pLPDA overlapping STENTS ~20%; nonDom RCA - proxRCA 40%, mid 90% (DES PCI)  . LEFT HEART CATH AND CORONARY ANGIOGRAPHY  12/28/2018   ([place]; Clarene Critchley, MD, referred by Dr. Mathis Bud) --> INDICATION (urgent, Unstable Angina): Moderate-severe (70%) stenosis of ostial RCA (DES PCI), mRCA 30%.  Otherwise no significant change from July 2020: dLM 20%, pLAD ~40% ISR , dLAD long/diffuse ~50%; OstLCx 30%, OM1 ~15% OM2 ~30% with patent  LPDA stents/PTCA site.   Marland Kitchen LEFT HEART CATH AND CORONARY ANGIOGRAPHY N/A  08/20/2019   Procedure: LEFT HEART CATH AND CORONARY ANGIOGRAPHY;  Surgeon: Leonie Man, MD;  Location: [place] [facility];  Service: Cardiovascular;  Laterality: N/A;  . NEPHRECTOMY Right 2012   With adrenalectomy  . NM MYOVIEW LTD  08/20/2018   [facility] -> Lexiscan Myoview: EF 81%.  No reversible ischemia or infarction.  Normal wall motion.  Marland Kitchen PACEMAKER IMPLANT  06/2016   [facility] [place] ([employer]) -> according to CT of chest, leads positioned in right atrium, cardiac apex and coronary sinus (suggesting CRT-P - BiV Pacing))  . RADIAL ARTERY HARVEST Left 08/25/2019   Procedure: RADIAL ARTERY HARVEST;  Surgeon: [name], MD;  Location: [place];  Service: Open Heart Surgery;  Laterality: Left;  . TEE WITHOUT CARDIOVERSION N/A 08/25/2019   Procedure: TRANSESOPHAGEAL ECHOCARDIOGRAM (TEE);  Surgeon: [name],  MD;  Location: [place];  Service: Open Heart Surgery;  Laterality: N/A;  . TRANSTHORACIC ECHOCARDIOGRAM  11/2018; 05/01/2019   ([place]) a) EF 60-65%.  Mild TR.;; b)moderate concentric LVH.  EF 55 to 60%.  No R WMA.  Normal RV size and function.  Normal atrial sizes.  Normal valves.    Medications: Prior to Admission medications   Medication Sig Start Date End Date Taking? Authorizing Provider  albuterol (VENTOLIN HFA) 108 (90 Base) MCG/ACT inhaler Inhale 2 puffs into the lungs every 4 (four) hours as needed for wheezing or shortness of breath.   Yes [provider]  aspirin EC 81 MG EC tablet Take 1 tablet (81 mg total) by mouth daily. 08/31/19  Yes Barrett, Erin R, PA-C  atorvastatin (LIPITOR) 40 MG tablet Take 40 mg by mouth daily.   Yes [provider]  calcium-vitamin D (OSCAL WITH D) 500-200 MG-UNIT tablet Take 1 tablet by mouth daily. 50 MCG (2000UNIT ) TAB CHOLECALCIFEROL    Yes [provider]  clopidogrel (PLAVIX) 75 MG tablet Take 75 mg by mouth daily.   Yes [provider]  furosemide (LASIX) 40 MG tablet Take 1 tablet (40 mg total) by mouth daily. 09/06/19  Yes Barrett, Erin R, PA-C  insulin glargine (LANTUS SOLOSTAR) 100 UNIT/ML Solostar Pen Inject 50 Units into the skin every evening. 100UNITS/3 ML    Yes [provider]  losartan (COZAAR) 50 MG tablet Take 50 mg by mouth daily.   Yes [provider]  MAGNESIUM OXIDE PO Take 500 mg by mouth daily. TAKE 1  TABLET BY MOUTH NIGHTLY FOR LEG CRAMPS    Yes [provider]  metFORMIN (GLUCOPHAGE) 500 MG tablet Take 500 mg by mouth 2 (two) times daily with a meal.   Yes [provider]  metoprolol tartrate (LOPRESSOR) 50 MG tablet Take 1 tablet (50 mg total) by mouth 2 (two) times daily. 09/20/19  Yes Lendon Colonel, NP  NITROSTAT 0.4 MG SL tablet Place 0.4 mg under the tongue every 5 (five) minutes as needed for chest pain.  12/09/13  Yes [provider]  Omega-3 Fatty Acids (FISH OIL BURP-LESS) 1000 MG CAPS Take 1,000 mg by mouth 2 (two) times daily.   Yes [provider]  pantoprazole (PROTONIX) 40 MG tablet Take 40 mg by mouth 2 (two) times daily before a meal.  12/09/13  Yes [provider]  warfarin (COUMADIN) 5 MG tablet Take 5-7.5 mg by mouth See admin instructions. Take 1 and 1/2 tablets on Monday, Wednesday and Friday then take 1 tablet all the other days  Yes [provider]  colchicine 0.6 MG tablet Take 0.5 tablets (0.3 mg total) by mouth 2 (two) times daily. Patient not taking: Reported on 11/23/2019 08/31/19   Barrett, Lodema Hong, PA-C  HYDROcodone-homatropine (HYCODAN) 5-1.5 MG/5ML syrup Take 5 mLs by mouth every 6 (six) hours as needed for cough. Patient not taking: Reported on 11/23/2019 09/06/19   Barrett, Lodema Hong, PA-C  isosorbide mononitrate (IMDUR) 30 MG 24 hr tablet Take 0.5 tablets (15 mg total) by mouth daily. Patient not taking: Reported on 11/23/2019 09/20/19 12/19/19  Lendon Colonel, NP  ondansetron (ZOFRAN) 8 MG tablet Take 1 tablet (8 mg total) by mouth every 8 (eight) hours as needed for nausea or vomiting. Patient not taking: Reported on 11/23/2019 09/06/19   Barrett, Erin R, PA-C  potassium chloride 20 MEQ TBCR Take 20 mEq by mouth daily. Patient not taking: Reported on 11/23/2019 09/06/19   Barrett, Lodema Hong, PA-C  traMADol (ULTRAM) 50 MG tablet Place 1 tablet (50 mg total) into feeding tube every 4 (four) hours as needed for moderate pain. Patient not taking: Reported on 11/23/2019 08/31/19   Barrett, Lodema Hong, PA-C    Allergies:   Allergies  Allergen Reactions  . Latex Rash  . Codeine Nausea And Vomiting  . Contrast Media [Iodinated Diagnostic Agents]     Reportedly cardiac arrest  . Integrilin [Eptifibatide]     Reportedly had shortness of breath, confusion.  . Tylenol [Acetaminophen]     Tylenol -3 with codiene  . Zithromax [Azithromycin] Nausea And Vomiting  . Glipizide Rash     Headache    Social History:  reports that he has never smoked. He has never used smokeless tobacco. He reports that he does not drink alcohol and does not use drugs.  Family History: Family History  Problem Relation Age of Onset  . Stroke Mother   . Hypertension Mother   . Hyperlipidemia Mother   . Hypertension Father   . Hyperlipidemia Father   . Coronary artery disease Father     Physical Exam: Vitals:   11/23/19 1900 11/23/19 2030 11/23/19 2045 11/23/19 2115  BP: (!) 189/80 (!) 189/87 (!) 196/82 (!) 181/80  Pulse: 79 77 74 75  Resp: 23 21 22 19   Temp:      TempSrc:      SpO2: 98% 97% 98% 94%    General:  Alert and oriented times three, well developed and nourished, no acute distress Eyes: PERRLA, pink conjunctiva, no scleral icterus ENT: Moist oral mucosa, neck supple, no thyromegaly Lungs: clear to ascultation, no wheeze, no crackles, no use of accessory muscles Cardiovascular: regular rate and rhythm, no regurgitation, no gallops, no murmurs. No carotid bruits, no JVD Abdomen: soft, positive BS, non-tender, non-distended, no organomegaly, not an acute abdomen GU: not examined Neuro: CN II - XII grossly intact, sensation intact Musculoskeletal: strength 5/5 all extremities, no clubbing, cyanosis or edema Skin: no rash, no subcutaneous crepitation, no decubitus Psych: appropriate patient   Labs on Admission:  Recent Labs    11/23/19 1320  NA 140  K 4.1  CL 104  CO2 24  GLUCOSE 140*  BUN 22*  CREATININE 1.53*  CALCIUM 9.5   No results for input(s): AST, ALT, ALKPHOS, BILITOT, PROT, ALBUMIN in the last 72 hours. No results for input(s): LIPASE, AMYLASE in the last 72 hours. Recent Labs    11/23/19 1320  WBC 7.6  HGB 10.8*  HCT 37.1*  MCV 80.3  PLT 214   No results for  input(s): CKTOTAL, CKMB, CKMBINDEX, TROPONINI in the last 72 hours. Invalid input(s): POCBNP No results for input(s): DDIMER in the last 72 hours. No results for input(s): HGBA1C  in the last 72 hours. No results for input(s): CHOL, HDL, LDLCALC, TRIG, CHOLHDL, LDLDIRECT in the last 72 hours. No results for input(s): TSH, T4TOTAL, T3FREE, THYROIDAB in the last 72 hours.  Invalid input(s): FREET3 No results for input(s): VITAMINB12, FOLATE, FERRITIN, TIBC, IRON, RETICCTPCT in the last 72 hours.  Micro Results: No results found for this or any previous visit (from the past 240 hour(s)).   Radiological Exams on Admission: DG Chest 2 View  Result Date: 11/23/2019 CLINICAL DATA:  Chest pain.  Hypertension. EXAM: CHEST - 2 VIEW COMPARISON:  09/30/2019. FINDINGS: AICD in stable position. Prior median sternotomy. Cardiomegaly. No pulmonary venous congestion. Mild bibasilar atelectasis. Slight increase in previously identified left pleural effusion. No pneumothorax. Surgical clips right upper quadrant. IMPRESSION: 1. AICD in stable position. Prior median sternotomy. Cardiomegaly. No pulmonary venous congestion. 2. Mild bibasilar atelectasis. Slight increase in previously identified left pleural effusion. Electronically Signed   By: Marcello Moores  Register   On: 11/23/2019 13:27    Assessment/Plan Present on Admission: . Essential hypertension uncontrolled -Bring in for overnight observation med telemetry -Home meds resumed and given with no -As needed blood pressure control  Chest pain -Thought to be secondary to his uncontrolled hypertension and noncompliance -Home meds resumed  . OSA treated with BiPAP -BiPAP ordered  . Hyperlipidemia with target LDL less than 70 -Stable patient's Maxine seen  CAD/history of heart block -See above -Plavix DC'd per cardiology recommendation  Diabetes mellitus -Sliding scale insulin started, home meds resumed  History of PE -  Cressida Milford 11/23/2019, 10:44 PM

## 2019-11-23 NOTE — ED Triage Notes (Signed)
Pt states he has been having CP for 1 week with hypertension. Pt states cp is worse with a deep breath. Pt had bypass surgery in April of this year.

## 2019-11-23 NOTE — ED Provider Notes (Signed)
[place] [facility] EMERGENCY DEPARTMENT Provider Note   CSN: [id number] Arrival date & time: 11/23/19  1259     History Chief Complaint  Patient presents with  . Chest Pain    John Dodson is a 59 y.o. male.  HPI Patient reports he has been getting chest pain off-and-on over the past week.  Pain is occurring with deep breath in the center of his chest.  There is an aching pressure quality when he breathes deeply.  Patient reports he has felt slightly short of breath.  He was seen at [facility] approximately 1 week ago.  At that time he was diagnosed with some mild congestive heart failure and peripheral edema.  Patient reports he was placed on Lasix 20 mg daily.  He reports the swelling in his legs have improved considerably.  He reports he has 1 more pill to take and then has completed his course.  Patient reports that he came in today because his blood pressure has been significantly elevated.  He reports typically when he takes his medications his blood pressures are in the 110s over 70s.  He reports a couple times this week now getting up to 093O and 671I systolic.  Patient reports he is compliant with his medications.  He suspected it might be due to recently taking colchicine.  He had a gout flareup in the left foot.  He reports that the colchicine seemed to really help.  He took his last dose day before yesterday.  Patient is chronically anticoagulated for PE status post Covid in December.  Patient reports that his last INR last week was 2.5.    Past Medical History:  Diagnosis Date  . Blepharitis of both eyes    Chronic  . Cataracts, both eyes   . CKD (chronic kidney disease) stage 3, GFR 30-59 ml/min    Status post right nephrectomy/adrenalectomy and diabetic nephropathy (baseline creatinine 1.4-1.5)  . Coronary artery disease involving native coronary artery 2015   (No cath report prior to 2016 noted, but as of 2016, had stents in LAD, OM and L PDA) as  documented since now in proximal LAD, OM 2-2 stents, L PDA at least 3 if not 4 stents, and also now nondominant RCA.  Marland Kitchen COVID-19 virus infection 04/25/2019  . Diabetic peripheral neuropathy associated with type 2 diabetes mellitus ([place])   . Essential hypertension   . GERD without esophagitis   . History of basal cell carcinoma (BCC) excision   . History of complete heart block 06/2016   Status post pacemaker placement  . History of non-ST elevation myocardial infarction (NSTEMI)    And several occasions of unstable angina  . Hyperlipidemia associated with type 2 diabetes mellitus [facility])    Per PCP note in February 2021, was on atorvastatin 40 mg.  . Obesity (BMI 30.0-34.9)   . Occlusion of left vertebral artery    Consider repossible acute lesion in August 2020 with presentation of headache.  CTA suggested occluded left vertebral artery throughout the neck.  Faint string-like enhancement intermittently visible suggesting recent occlusion.  Partial reconstruction and posterior fossa.  Left PICA patent.  (Consider possible left vertebral artery dissection)  . OSA treated with BiPAP 02/2019   Diagnosis in late 2020 ([facility])--> delayed onset of treatment with BiPAP due to Covid hospitalization followed by PE. ->  BiPAP setting at 21/17 cm  . Pulmonary embolism ([place]) 05/18/2019   (1 month following COVID-19 infection): DVT-PE (bilateral PEs noted on VQ  scan)-> started on warfarin.  (On warfarin plus Plavix now with aspirin stopped.)  . Type 2 diabetes mellitus with complication, with long-term current use of insulin (HCC)    On Lantus, Jardiance, Metformin & Onglyza    Patient Active Problem List   Diagnosis Date Noted  . 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 Coronary artery disease 08/25/2019  . Coronary artery disease involving native coronary artery with unstable angina pectoris ([place]) 08/20/2019  . Progressive angina ([place]) 08/12/2019  . Coronary artery disease involving native coronary  artery of native heart with angina pectoris ([place]) 07/27/2019  . Chest pain with moderate risk for cardiac etiology 07/27/2019  . Type 2 diabetes mellitus with complication, with long-term current use of insulin ([place])   . Pulmonary embolism on long-term anticoagulation therapy ([place]) 05/18/2019  . OSA treated with BiPAP 02/2019  . CAD S/P PCI 12/28/2018  . History of complete heart block 06/2016  . Hyperlipidemia with target LDL less than 70 07/26/2013  . Essential hypertension 07/26/2013    Past Surgical History:  Procedure Laterality Date  . BASAL CELL CARCINOMA EXCISION  01/2015  . CAROTID DUPLEX SCAN  12/25/2018   [facility]: Mild plaque in both carotids.  139% bilateral.  Right vertebral flow normal antegrade, Left not seen; normal bilateral subclavian flow..  . CHOLECYSTECTOMY    . CORONARY ARTERY BYPASS GRAFT N/A 08/25/2019   Procedure: CORONARY ARTERY BYPASS GRAFTING (CABG) TIMES FOUR, ON PUMP, USING LEFT AND RIGHT INTERNAL MAMMARY ARTERY AND LEFT RADIAL ARTERY;  Surgeon: [name], MD;  Location: [facility];  Service: Open Heart Surgery;  Laterality: N/A;  . CORONARY STENT INTERVENTION  04/10/2015   ([place]; Bishop Limbo, DO) -> DES PCI mLPDA: Xience Alpine DES 2.5 mm x 18 mm, Xience Alpine DES 2.25 mm x 12 mm overlapping.  . CORONARY STENT INTERVENTION  04/04/2016   ([place]; Grover Beach, MD)--> (urgent) 100% mLPDA PTCA with 2.25 mm balloon - reduced to 50%; mid OM2 90% - DES PCI (Xience DES  2.5 x 18).   . CORONARY STENT INTERVENTION  2015   Prior to December 2016, STENTS noted in prox LAD, proximal OM1, and at least 2 stents in proximal L PDA  . CORONARY STENT INTERVENTION  11/17/2018   ([place]; Bishop Limbo, DO): CULPRIT: mid 90% (DES PCI) -Resolute Onyx DES 2.5 mm x 30 mm --> COMPLICATION: Large left arm hematoma-evaluated by vascular and orthopedic surgery, managed with splint and arm elevation.  . CORONARY STENT  INTERVENTION  12/28/2018   ([place]; Clarene Critchley, MD, referred by Dr. Mathis Bud) --> INDICATION (urgent, Unstable Angina): Moderate-severe (70%) stenosis of ostial RCA  -> DES PCI Resolute Onyx DES 2.5 mm x 12 mm.   Marland Kitchen LEFT HEART CATH AND CORONARY ANGIOGRAPHY  04/10/2015   ([place]; Bishop Limbo, DO)--> EF 55%.  Mild inferior HK.  Coronaries-LM: Normal, p LAD STENT 30% ISR, mLAD 20% & diffuse dLAD ~20%; Dom LCx: mCx 20%, ~pOM1 STENT (noted as OM2 in other reports) patent with mid 30%, OM2 normal, prox LPDA "STENTS" patent with SEVERE mid L PDA 90-95% (DES PCI x 2 overlapping); Small-non-dominant RCA  patent   . LEFT HEART CATH AND CORONARY ANGIOGRAPHY  04/04/2016   ([place]; Christopher, MD)--> (urgent): EF 70%.  Previous LAD,~OM2 and proximal PDA stents patent; -> mLAD 35%, dLAD 40%; LCx-OM1 75% (short lesion, med Rx), mid OM2 90% (DES PCI), pLPDA stents patent w/ mPDA 100% (  PTCA only - reduced to 50%); non-dom RCA - ost RCA 60% & mRCA 75%  . LEFT HEART CATH AND CORONARY ANGIOGRAPHY  11/17/2018   ([place]; Bishop Limbo, DO) indication: Angina.  LM normal; LAD - ost LAD ~50%, pLAD STENT ~30% ISR with dLAD ~40%; Dom LCx - ost & prox Cx 30%, OM1 STENT 20% ISR, OM2 STENT 50% distal edge, pLPDA overlapping STENTS ~20%; nonDom RCA - proxRCA 40%, mid 90% (DES PCI)  . LEFT HEART CATH AND CORONARY ANGIOGRAPHY  12/28/2018   ([place]; Clarene Critchley, MD, referred by Dr. Mathis Bud) --> INDICATION (urgent, Unstable Angina): Moderate-severe (70%) stenosis of ostial RCA (DES PCI), mRCA 30%.  Otherwise no significant change from July 2020: dLM 20%, pLAD ~40% ISR , dLAD long/diffuse ~50%; OstLCx 30%, OM1 ~15% OM2 ~30% with patent  LPDA stents/PTCA site.   Marland Kitchen LEFT HEART CATH AND CORONARY ANGIOGRAPHY N/A 08/20/2019   Procedure: LEFT HEART CATH AND CORONARY ANGIOGRAPHY;  Surgeon: Leonie Man, MD;  Location: [place] CV LAB;   Service: Cardiovascular;  Laterality: N/A;  . NEPHRECTOMY Right 2012   With adrenalectomy  . NM MYOVIEW LTD  08/20/2018   [facility] -> Lexiscan Myoview: EF 81%.  No reversible ischemia or infarction.  Normal wall motion.  Marland Kitchen PACEMAKER IMPLANT  06/2016   [facility], [place] ([employer]) -> according to CT of chest, leads positioned in right atrium, cardiac apex and coronary sinus (suggesting CRT-P - BiV Pacing))  . RADIAL ARTERY HARVEST Left 08/25/2019   Procedure: RADIAL ARTERY HARVEST;  Surgeon: [name], MD;  Location: [place];  Service: Open Heart Surgery;  Laterality: Left;  . TEE WITHOUT CARDIOVERSION N/A 08/25/2019   Procedure: TRANSESOPHAGEAL ECHOCARDIOGRAM (TEE);  Surgeon: [name], MD;  Location: [place];  Service: Open Heart Surgery;  Laterality: N/A;  . TRANSTHORACIC ECHOCARDIOGRAM  11/2018; 05/01/2019   ([place]) a) EF 60-65%.  Mild TR.;; b)moderate concentric LVH.  EF 55 to 60%.  No R WMA.  Normal RV size and function.  Normal atrial sizes.  Normal valves.       Family History  Problem Relation Age of Onset  . Stroke Mother   . Hypertension Mother   . Hyperlipidemia Mother   . Hypertension Father   . Hyperlipidemia Father   . Coronary artery disease Father     Social History   Tobacco Use  . Smoking status: Never Smoker  . Smokeless tobacco: Never Used  Vaping Use  . Vaping Use: Never used  Substance Use Topics  . Alcohol use: No  . Drug use: No    Home Medications Prior to Admission medications   Medication Sig Start Date End Date Taking? Authorizing Provider  albuterol (VENTOLIN HFA) 108 (90 Base) MCG/ACT inhaler Inhale 2 puffs into the lungs every 4 (four) hours as needed for wheezing or shortness of breath.   Yes [provider]  aspirin EC 81 MG EC tablet Take 1 tablet (81 mg total) by mouth daily. 08/31/19  Yes Barrett, Erin R, PA-C  atorvastatin (LIPITOR) 40 MG tablet Take 40 mg by mouth daily.    Yes [provider]  calcium-vitamin D (OSCAL WITH D) 500-200 MG-UNIT tablet Take 1 tablet by mouth daily. 50 MCG (2000UNIT ) TAB CHOLECALCIFEROL    Yes [provider]  clopidogrel (PLAVIX) 75 MG tablet Take 75 mg by mouth daily.   Yes [provider]  furosemide (LASIX) 40 MG tablet Take  1 tablet (40 mg total) by mouth daily. 09/06/19  Yes Barrett, Erin R, PA-C  insulin glargine (LANTUS SOLOSTAR) 100 UNIT/ML Solostar Pen Inject 50 Units into the skin every evening. 100UNITS/3 ML    Yes [provider]  losartan (COZAAR) 50 MG tablet Take 50 mg by mouth daily.   Yes [provider]  MAGNESIUM OXIDE PO Take 500 mg by mouth daily. TAKE 1  TABLET BY MOUTH NIGHTLY FOR LEG CRAMPS    Yes [provider]  metFORMIN (GLUCOPHAGE) 500 MG tablet Take 500 mg by mouth 2 (two) times daily with a meal.   Yes [provider]  metoprolol tartrate (LOPRESSOR) 50 MG tablet Take 1 tablet (50 mg total) by mouth 2 (two) times daily. 09/20/19  Yes Lendon Colonel, NP  NITROSTAT 0.4 MG SL tablet Place 0.4 mg under the tongue every 5 (five) minutes as needed for chest pain.  12/09/13  Yes [provider]  Omega-3 Fatty Acids (FISH OIL BURP-LESS) 1000 MG CAPS Take 1,000 mg by mouth 2 (two) times daily.   Yes [provider]  pantoprazole (PROTONIX) 40 MG tablet Take 40 mg by mouth 2 (two) times daily before a meal.  12/09/13  Yes [provider]  warfarin (COUMADIN) 5 MG tablet Take 5-7.5 mg by mouth See admin instructions. Take 1 and 1/2 tablets on Monday, Wednesday and Friday then take 1 tablet all the other days   Yes [provider]  colchicine 0.6 MG tablet Take 0.5 tablets (0.3 mg total) by mouth 2 (two) times daily. Patient not taking: Reported on 11/23/2019 08/31/19   Barrett, Lodema Hong, PA-C  HYDROcodone-homatropine (HYCODAN) 5-1.5 MG/5ML syrup Take 5 mLs by mouth every 6 (six) hours as needed for cough. Patient not taking:  Reported on 11/23/2019 09/06/19   Barrett, Lodema Hong, PA-C  isosorbide mononitrate (IMDUR) 30 MG 24 hr tablet Take 0.5 tablets (15 mg total) by mouth daily. Patient not taking: Reported on 11/23/2019 09/20/19 12/19/19  Lendon Colonel, NP  ondansetron (ZOFRAN) 8 MG tablet Take 1 tablet (8 mg total) by mouth every 8 (eight) hours as needed for nausea or vomiting. Patient not taking: Reported on 11/23/2019 09/06/19   Barrett, Erin R, PA-C  potassium chloride 20 MEQ TBCR Take 20 mEq by mouth daily. Patient not taking: Reported on 11/23/2019 09/06/19   Barrett, Lodema Hong, PA-C  traMADol (ULTRAM) 50 MG tablet Place 1 tablet (50 mg total) into feeding tube every 4 (four) hours as needed for moderate pain. Patient not taking: Reported on 11/23/2019 08/31/19   Barrett, Lodema Hong, PA-C    Allergies    Latex, Codeine, Contrast media [iodinated diagnostic agents], Integrilin [eptifibatide], Tylenol [acetaminophen], Zithromax [azithromycin], and Glipizide  Review of Systems   Review of Systems 10 systems reviewed and negative except as per HPI Physical Exam Updated Vital Signs BP (!) 181/80 (BP Location: Right Arm)   Pulse 75   Temp 98.3 F (36.8 C) (Oral)   Resp 19   SpO2 94%   Physical Exam Constitutional:      Comments: Alert and nontoxic.  No respiratory distress at rest.  Mental status clear.  HENT:     Head: Normocephalic and atraumatic.  Eyes:     Extraocular Movements: Extraocular movements intact.  Cardiovascular:     Rate and Rhythm: Normal rate and regular rhythm.  Pulmonary:     Comments: No respiratory distress.  Mild crackle at left lung base.  Occasional cough with deep inspiration.  Chest pain is not significantly reproducible to compression.  Patient has a well-healing sternotomy scar. Abdominal:     General: There is no distension.     Palpations: Abdomen is soft.     Tenderness: There is no abdominal tenderness. There is no guarding.  Musculoskeletal:     Comments: Trace edema at the  lower legs and ankles.  Hitting.  Skin condition very good.  Calf soft and nontender.  Feet in very good condition.  Skin:    General: Skin is warm and dry.  Neurological:     General: No focal deficit present.     Mental Status: He is oriented to person, place, and time.     Coordination: Coordination normal.  Psychiatric:        Mood and Affect: Mood normal.     ED Results / Procedures / Treatments   Labs (all labs ordered are listed, but only abnormal results are displayed) Labs Reviewed  BASIC METABOLIC PANEL - Abnormal; Notable for the following components:      Result Value   Glucose, Bld 140 (*)    BUN 22 (*)    Creatinine, Ser 1.53 (*)    GFR calc non Af Amer 50 (*)    GFR calc Af Amer 58 (*)    All other components within normal limits  CBC - Abnormal; Notable for the following components:   Hemoglobin 10.8 (*)    HCT 37.1 (*)    MCH 23.4 (*)    MCHC 29.1 (*)    All other components within normal limits  PROTIME-INR - Abnormal; Notable for the following components:   Prothrombin Time 26.7 (*)    INR 2.6 (*)    All other components within normal limits  CBG MONITORING, ED - Abnormal; Notable for the following components:   Glucose-Capillary 47 (*)    All other components within normal limits  TROPONIN I (HIGH SENSITIVITY) - Abnormal; Notable for the following components:   Troponin I (High Sensitivity) 20 (*)    All other components within normal limits  TROPONIN I (HIGH SENSITIVITY) - Abnormal; Notable for the following components:   Troponin I (High Sensitivity) 19 (*)    All other components within normal limits    EKG EKG Interpretation  Date/Time:  Tuesday November 23 2019 13:08:05 EDT Ventricular Rate:  70 PR Interval:  148 QRS Duration: 148 QT Interval:  452 QTC Calculation: 488 R Axis:   -101 Text Interpretation: ventricular paced Right bundle branch block Cannot rule out Anteroseptal infarct , age undetermined Abnormal ECG no sig change from old  Confirmed by Charlesetta Shanks [phone]) on 11/23/2019 4:40:21 PM   Radiology DG Chest 2 View  Result Date: 11/23/2019 CLINICAL DATA:  Chest pain.  Hypertension. EXAM: CHEST - 2 VIEW COMPARISON:  09/30/2019. FINDINGS: AICD in stable position. Prior median sternotomy. Cardiomegaly. No pulmonary venous congestion. Mild bibasilar atelectasis. Slight increase in previously identified left pleural effusion. No pneumothorax. Surgical clips right upper quadrant. IMPRESSION: 1. AICD in stable position. Prior median sternotomy. Cardiomegaly. No pulmonary venous congestion. 2. Mild bibasilar atelectasis. Slight increase in previously identified left pleural effusion. Electronically Signed   By: Marcello Moores  Register   On: 11/23/2019 13:27    Procedures Procedures (including critical care time) CRITICAL CARE Performed by: Charlesetta Shanks   Total critical care time: 30 minutes  Critical care time was exclusive of separately billable procedures and treating other patients.  Critical care was necessary to treat or prevent imminent or life-threatening deterioration.  Critical care was time spent personally by me on the following activities: development of treatment plan with patient and/or surrogate as well as nursing, discussions with consultants, evaluation of patient's response to treatment, examination of patient, obtaining history from patient or surrogate, ordering and performing treatments and interventions, ordering and review of laboratory studies, ordering and review of radiographic studies, pulse oximetry and re-evaluation of patient's condition. Medications Ordered in ED Medications  sodium chloride flush (NS) 0.9 % injection 3 mL (3 mLs Intravenous Not Given 11/23/19 2016)  nitroGLYCERIN (NITROGLYN) 2 % ointment 1 inch (1 inch Topical Given 11/23/19 2010)  dextrose 50 % solution (  Not Given 11/23/19 2041)  furosemide (LASIX) injection 60 mg (has no administration in time range)  hydrALAZINE (APRESOLINE)  injection 20 mg (has no administration in time range)  hydrALAZINE (APRESOLINE) injection 20 mg (has no administration in time range)  metoprolol tartrate (LOPRESSOR) injection 5 mg (has no administration in time range)  metoprolol tartrate (LOPRESSOR) injection 5 mg (5 mg Intravenous Given 11/23/19 1951)  losartan (COZAAR) tablet 50 mg (50 mg Oral Given 11/23/19 2010)  metoprolol tartrate (LOPRESSOR) tablet 50 mg (50 mg Oral Given 11/23/19 2010)    ED Course  I have reviewed the triage vital signs and the nursing notes.  Pertinent labs & imaging results that were available during my care of the patient were reviewed by me and considered in my medical decision making (see chart for details).  Clinical Course as of Nov 23 2231  Tue Nov 23, 2019  2229 Consult: [facility] hospitalist for admission.   [MP]    Clinical Course User Index [MP] Charlesetta Shanks, MD   MDM Rules/Calculators/A&P                         Consult: Cardiology.  Patient presents as outlined above with chest pressure and shortness of breath.  History of severe coronary artery disease and bypass grafting earlier in the year.  Patient is significantly hypertensive.  Treatment initiated with IV and oral medications.  Patient's home medications were reinstituted and IV Lopressor and hydralazine administered.  Cardiology has assessed patient at this time recommendation for BP management.  Chest pain is been waxing and waning pressure quality over a week.  Troponins are flat.  At this time does not exhibit ACS.  Patient is chronically anticoagulated on Coumadin and INR is therapeutic.  Anticoagulation is for history of PE status post Covid infection earlier in the year.  Plan for admission to hospitalist service for ongoing management of hypertensive urgency and chest pain. Final Clinical Impression(s) / ED Diagnoses Final diagnoses:  Hypertensive urgency  Precordial pain  Severe comorbid illness    Rx / DC Orders ED Discharge  Orders    None       Charlesetta Shanks, MD 11/23/19 2237

## 2019-11-23 NOTE — ED Notes (Signed)
Checked pts glucose per request, glucometer showed a blood sugar of 47. RN notified.

## 2019-11-24 ENCOUNTER — Encounter (HOSPITAL_COMMUNITY): Payer: Self-pay | Admitting: Family Medicine

## 2019-11-24 DIAGNOSIS — E785 Hyperlipidemia, unspecified: Secondary | ICD-10-CM

## 2019-11-24 DIAGNOSIS — G4733 Obstructive sleep apnea (adult) (pediatric): Secondary | ICD-10-CM

## 2019-11-24 DIAGNOSIS — R072 Precordial pain: Secondary | ICD-10-CM | POA: Diagnosis not present

## 2019-11-24 DIAGNOSIS — Z8679 Personal history of other diseases of the circulatory system: Secondary | ICD-10-CM | POA: Diagnosis not present

## 2019-11-24 DIAGNOSIS — I160 Hypertensive urgency: Secondary | ICD-10-CM | POA: Diagnosis not present

## 2019-11-24 DIAGNOSIS — Z9861 Coronary angioplasty status: Secondary | ICD-10-CM

## 2019-11-24 DIAGNOSIS — E118 Type 2 diabetes mellitus with unspecified complications: Secondary | ICD-10-CM

## 2019-11-24 DIAGNOSIS — I2510 Atherosclerotic heart disease of native coronary artery without angina pectoris: Secondary | ICD-10-CM | POA: Diagnosis not present

## 2019-11-24 DIAGNOSIS — N182 Chronic kidney disease, stage 2 (mild): Secondary | ICD-10-CM

## 2019-11-24 DIAGNOSIS — I10 Essential (primary) hypertension: Secondary | ICD-10-CM

## 2019-11-24 DIAGNOSIS — I5033 Acute on chronic diastolic (congestive) heart failure: Secondary | ICD-10-CM

## 2019-11-24 DIAGNOSIS — R079 Chest pain, unspecified: Secondary | ICD-10-CM | POA: Diagnosis not present

## 2019-11-24 DIAGNOSIS — R0789 Other chest pain: Secondary | ICD-10-CM | POA: Diagnosis present

## 2019-11-24 DIAGNOSIS — Z794 Long term (current) use of insulin: Secondary | ICD-10-CM

## 2019-11-24 LAB — CBC
HCT: 38.7 % — ABNORMAL LOW (ref 39.0–52.0)
Hemoglobin: 11.8 g/dL — ABNORMAL LOW (ref 13.0–17.0)
MCH: 24 pg — ABNORMAL LOW (ref 26.0–34.0)
MCHC: 30.5 g/dL (ref 30.0–36.0)
MCV: 78.7 fL — ABNORMAL LOW (ref 80.0–100.0)
Platelets: 244 10*3/uL (ref 150–400)
RBC: 4.92 MIL/uL (ref 4.22–5.81)
RDW: 14.9 % (ref 11.5–15.5)
WBC: 8.4 10*3/uL (ref 4.0–10.5)
nRBC: 0 % (ref 0.0–0.2)

## 2019-11-24 LAB — BASIC METABOLIC PANEL
Anion gap: 10 (ref 5–15)
BUN: 20 mg/dL (ref 6–20)
CO2: 26 mmol/L (ref 22–32)
Calcium: 9.4 mg/dL (ref 8.9–10.3)
Chloride: 103 mmol/L (ref 98–111)
Creatinine, Ser: 1.31 mg/dL — ABNORMAL HIGH (ref 0.61–1.24)
GFR calc Af Amer: >60 mL/min (ref >60)
GFR calc non Af Amer: >60 mL/min (ref >60)
Glucose, Bld: 203 mg/dL — ABNORMAL HIGH (ref 70–99)
Potassium: 3.8 mmol/L (ref 3.5–5.1)
Sodium: 139 mmol/L (ref 135–145)

## 2019-11-24 LAB — GLUCOSE, CAPILLARY
Glucose-Capillary: 122 mg/dL — ABNORMAL HIGH (ref 70–99)
Glucose-Capillary: 225 mg/dL — ABNORMAL HIGH (ref 70–99)
Glucose-Capillary: 109 mg/dL — ABNORMAL HIGH (ref 70–99)
Glucose-Capillary: 125 mg/dL — ABNORMAL HIGH (ref 70–99)

## 2019-11-24 LAB — HEMOGLOBIN A1C
Hgb A1c MFr Bld: 7.8 % — ABNORMAL HIGH (ref 4.8–5.6)
Mean Plasma Glucose: 177.16 mg/dL

## 2019-11-24 LAB — HIV ANTIBODY (ROUTINE TESTING W REFLEX): HIV Screen 4th Generation wRfx: NONREACTIVE

## 2019-11-24 LAB — PROTIME-INR
INR: 2.3 — ABNORMAL HIGH (ref 0.8–1.2)
Prothrombin Time: 24.7 seconds — ABNORMAL HIGH (ref 11.4–15.2)

## 2019-11-24 LAB — SARS CORONAVIRUS 2 BY RT PCR (HOSPITAL ORDER, PERFORMED IN ~~LOC~~ HOSPITAL LAB): SARS Coronavirus 2: NEGATIVE

## 2019-11-24 LAB — MAGNESIUM: Magnesium: 1.9 mg/dL (ref 1.7–2.4)

## 2019-11-24 LAB — CBG MONITORING, ED: Glucose-Capillary: 178 mg/dL — ABNORMAL HIGH (ref 70–99)

## 2019-11-24 MED ORDER — INSULIN ASPART 100 UNIT/ML ~~LOC~~ SOLN
0.0000 [IU] | Freq: Three times a day (TID) | SUBCUTANEOUS | Status: DC
  Administered 2019-11-24: 5 [IU] via SUBCUTANEOUS
  Administered 2019-11-24: 2 [IU] via SUBCUTANEOUS

## 2019-11-24 MED ORDER — ISOSORBIDE MONONITRATE ER 30 MG PO TB24: 15.0000 mg | ORAL_TABLET | Freq: Every day | ORAL | Status: DC

## 2019-11-24 MED ORDER — EMPAGLIFLOZIN 10 MG PO TABS
10.0000 mg | ORAL_TABLET | Freq: Every day | ORAL | Status: DC
  Administered 2019-11-24 – 2019-11-25 (×2): 10 mg via ORAL
  Filled 2019-11-24 (×2): qty 1

## 2019-11-24 MED ORDER — ACETAMINOPHEN 650 MG RE SUPP: 650.0000 mg | Freq: Four times a day (QID) | RECTAL | Status: DC | PRN

## 2019-11-24 MED ORDER — LOSARTAN POTASSIUM 50 MG PO TABS
50.0000 mg | ORAL_TABLET | Freq: Every day | ORAL | Status: DC
  Administered 2019-11-24 – 2019-11-25 (×2): 50 mg via ORAL
  Filled 2019-11-24 (×2): qty 1

## 2019-11-24 MED ORDER — CLOPIDOGREL BISULFATE 75 MG PO TABS: 75.0000 mg | ORAL_TABLET | Freq: Every day | ORAL | Status: DC

## 2019-11-24 MED ORDER — ACETAMINOPHEN 325 MG PO TABS
650.0000 mg | ORAL_TABLET | Freq: Once | ORAL | Status: AC
  Administered 2019-11-24: 650 mg via ORAL
  Filled 2019-11-24: qty 2

## 2019-11-24 MED ORDER — ONDANSETRON HCL 4 MG/2ML IJ SOLN
4.0000 mg | Freq: Four times a day (QID) | INTRAMUSCULAR | Status: DC | PRN
  Administered 2019-11-24: 4 mg via INTRAVENOUS
  Filled 2019-11-24: qty 2

## 2019-11-24 MED ORDER — INSULIN GLARGINE 100 UNIT/ML ~~LOC~~ SOLN
50.0000 [IU] | Freq: Every evening | SUBCUTANEOUS | Status: DC
  Administered 2019-11-24 (×2): 50 [IU] via SUBCUTANEOUS
  Filled 2019-11-24 (×3): qty 0.5

## 2019-11-24 MED ORDER — METOPROLOL TARTRATE 50 MG PO TABS
50.0000 mg | ORAL_TABLET | Freq: Two times a day (BID) | ORAL | Status: DC
  Administered 2019-11-24 – 2019-11-25 (×4): 50 mg via ORAL
  Filled 2019-11-24 (×3): qty 1

## 2019-11-24 MED ORDER — MORPHINE SULFATE (PF) 2 MG/ML IV SOLN: 2.0000 mg | INTRAVENOUS | Status: DC | PRN

## 2019-11-24 MED ORDER — ASPIRIN EC 81 MG PO TBEC
81.0000 mg | DELAYED_RELEASE_TABLET | Freq: Every day | ORAL | Status: DC
  Administered 2019-11-24 – 2019-11-25 (×2): 81 mg via ORAL
  Filled 2019-11-24 (×2): qty 1

## 2019-11-24 MED ORDER — WARFARIN SODIUM 7.5 MG PO TABS
7.5000 mg | ORAL_TABLET | Freq: Once | ORAL | Status: AC
  Administered 2019-11-24: 7.5 mg via ORAL
  Filled 2019-11-24: qty 1

## 2019-11-24 MED ORDER — WARFARIN SODIUM 7.5 MG PO TABS
7.5000 mg | ORAL_TABLET | Freq: Once | ORAL | Status: DC
  Filled 2019-11-24 (×2): qty 1

## 2019-11-24 MED ORDER — ISOSORBIDE MONONITRATE ER 30 MG PO TB24
30.0000 mg | ORAL_TABLET | Freq: Every day | ORAL | Status: DC
  Administered 2019-11-24 – 2019-11-25 (×2): 30 mg via ORAL
  Filled 2019-11-24 (×2): qty 1

## 2019-11-24 MED ORDER — ONDANSETRON HCL 4 MG/2ML IJ SOLN: 4.0000 mg | Freq: Four times a day (QID) | INTRAMUSCULAR | Status: DC | PRN

## 2019-11-24 MED ORDER — WARFARIN - PHARMACIST DOSING INPATIENT: Freq: Every day | Status: DC

## 2019-11-24 MED ORDER — PANTOPRAZOLE SODIUM 40 MG PO TBEC
40.0000 mg | DELAYED_RELEASE_TABLET | Freq: Two times a day (BID) | ORAL | Status: DC
  Administered 2019-11-24 – 2019-11-25 (×3): 40 mg via ORAL
  Filled 2019-11-24 (×4): qty 1

## 2019-11-24 MED ORDER — ACETAMINOPHEN 325 MG PO TABS
650.0000 mg | ORAL_TABLET | Freq: Four times a day (QID) | ORAL | Status: DC | PRN
  Administered 2019-11-24 – 2019-11-25 (×5): 650 mg via ORAL
  Filled 2019-11-24 (×5): qty 2

## 2019-11-24 MED ORDER — WARFARIN SODIUM 5 MG PO TABS: 5.0000 mg | ORAL_TABLET | ORAL | Status: DC

## 2019-11-24 MED ORDER — INSULIN ASPART 100 UNIT/ML ~~LOC~~ SOLN: 0.0000 [IU] | Freq: Every day | SUBCUTANEOUS | Status: DC

## 2019-11-24 MED ORDER — FUROSEMIDE 10 MG/ML IJ SOLN
40.0000 mg | Freq: Once | INTRAMUSCULAR | Status: AC
  Administered 2019-11-24: 40 mg via INTRAVENOUS
  Filled 2019-11-24: qty 4

## 2019-11-24 MED ORDER — SENNOSIDES-DOCUSATE SODIUM 8.6-50 MG PO TABS: 1.0000 | ORAL_TABLET | Freq: Every evening | ORAL | Status: DC | PRN

## 2019-11-24 MED ORDER — ATORVASTATIN CALCIUM 40 MG PO TABS
40.0000 mg | ORAL_TABLET | Freq: Every day | ORAL | Status: DC
  Administered 2019-11-24 – 2019-11-25 (×2): 40 mg via ORAL
  Filled 2019-11-24 (×2): qty 1

## 2019-11-24 NOTE — Care Management (Signed)
1251 11-24-19 Benefits check submitted for Jardiance and Farxiga. Case Manager will follow for cost. Graves-Bigelow, Ocie Cornfield, RN, BSN Case Manager

## 2019-11-24 NOTE — Progress Notes (Signed)
   11/24/19 2155  Assess: MEWS Score  Temp 98.4 F (36.9 C)  BP (!) 148/76  Pulse Rate (!) 111  Resp 17  Level of Consciousness Alert  SpO2 95 %  O2 Device Room Air  Assess: MEWS Score  MEWS Temp 0  MEWS Systolic 0  MEWS Pulse 2  MEWS RR 0  MEWS LOC 0  MEWS Score 2  MEWS Score Color Yellow  Assess: if the MEWS score is Yellow or Red  Were vital signs taken at a resting state? Yes  Focused Assessment No change from prior assessment  Early Detection of Sepsis Score *See Row Information* Low  MEWS guidelines implemented *See Row Information* Yes  Treat  MEWS Interventions Administered scheduled meds/treatments;Escalated (See documentation below)  Pain Scale 0-10  Pain Score 0  Escalate  MEWS: Escalate Yellow: discuss with charge nurse/RN and consider discussing with provider and RRT  Notify: Charge Nurse/RN  Name of Charge Nurse/RN Notified [place]  Date Charge Nurse/RN Notified 11/24/19  Time Charge Nurse/RN Notified 2155  Document  Patient Outcome Stabilized after interventions  Progress note created (see row info) Yes

## 2019-11-24 NOTE — TOC Benefit Eligibility Note (Signed)
Transition of Care [facility]) Benefit Eligibility Note    Patient Details  Name: FAUST THORINGTON MRN: [id number] Date of Birth: October 01, 1960   Medication/Dose: JARDIANCE  10 MG PO DAILY  Covered?: Yes  Tier: 3 Drug  Prescription Coverage [facility]: [facility]  Spoke with Person/Company/Phone Number:: TINA  @ [place]   # [phone]  Co-Pay: $47.00  Prior Approval: No  Deductible: Met  Additional Notes: FARXIGA  5 MG [place]- $47.00 [place]    Memory Argue Phone Number: 11/24/2019, 2:03 PM

## 2019-11-24 NOTE — Progress Notes (Signed)
ANTICOAGULATION CONSULT NOTE - Initial Consult  Pharmacy Consult for coumadin Indication: DVT  Allergies  Allergen Reactions  . Latex Rash  . Codeine Nausea And Vomiting  . Contrast Media [Iodinated Diagnostic Agents]     Reportedly cardiac arrest  . Integrilin [Eptifibatide]     Reportedly had shortness of breath, confusion.  . Tylenol [Acetaminophen]     Tylenol -3 with codiene  . Zithromax [Azithromycin] Nausea And Vomiting  . Glipizide Rash    Headache    Vital Signs: Temp: 98.3 F (36.8 C) (07/27 1309) Temp Source: Oral (07/27 1309) BP: 158/78 (07/28 0030) Pulse Rate: 86 (07/28 0030)  Labs: Recent Labs    11/23/19 1320 11/23/19 1640 11/23/19 1926  HGB 10.8*  --   --   HCT 37.1*  --   --   PLT 214  --   --   LABPROT  --   --  26.7*  INR  --   --  2.6*  CREATININE 1.53*  --   --   TROPONINIHS 20* 19*  --     CrCl cannot be calculated (Unknown ideal weight.).   Medical History: Past Medical History:  Diagnosis Date  . Blepharitis of both eyes    Chronic  . Cataracts, both eyes   . CKD (chronic kidney disease) stage 3, GFR 30-59 ml/min    Status post right nephrectomy/adrenalectomy and diabetic nephropathy (baseline creatinine 1.4-1.5)  . Coronary artery disease involving native coronary artery 2015   (No cath report prior to 2016 noted, but as of 2016, had stents in LAD, OM and L PDA) as documented since now in proximal LAD, OM 2-2 stents, L PDA at least 3 if not 4 stents, and also now nondominant RCA.  Marland Kitchen COVID-19 virus infection 04/25/2019  . Diabetic peripheral neuropathy associated with type 2 diabetes mellitus ([place])   . Essential hypertension   . GERD without esophagitis   . History of basal cell carcinoma (BCC) excision   . History of complete heart block 06/2016   Status post pacemaker placement  . History of non-ST elevation myocardial infarction (NSTEMI)    And several occasions of unstable angina  . Hyperlipidemia associated with type 2  diabetes mellitus [facility])    Per PCP note in February 2021, was on atorvastatin 40 mg.  . Obesity (BMI 30.0-34.9)   . Occlusion of left vertebral artery    Consider repossible acute lesion in August 2020 with presentation of headache.  CTA suggested occluded left vertebral artery throughout the neck.  Faint string-like enhancement intermittently visible suggesting recent occlusion.  Partial reconstruction and posterior fossa.  Left PICA patent.  (Consider possible left vertebral artery dissection)  . OSA treated with BiPAP 02/2019   Diagnosis in late 2020 ([facility])--> delayed onset of treatment with BiPAP due to Covid hospitalization followed by PE. ->  BiPAP setting at 21/17 cm  . Pulmonary embolism ([place]) 05/18/2019   (1 month following COVID-19 infection): DVT-PE (bilateral PEs noted on VQ scan)-> started on warfarin.  (On warfarin plus Plavix now with aspirin stopped.)  . Type 2 diabetes mellitus with complication, with long-term current use of insulin (HCC)    On Lantus, Jardiance, Metformin & Onglyza    Medications:  Coumadin 7.5 mg MWF, 5 mg other days, last dose 7/26  Assessment: 59 yo man to continue home coumadin for h/o PE.  INR 2.6  Hg 10.8, PTLC 214 Goal of Therapy:  INR 2-3 Monitor platelets by anticoagulation protocol: Yes  Plan:  Coumadin 7.5 mg today at 16:00 Daily PT/INR Monitor for bleeding complications and drug interactions  Payal Stanforth Poteet 11/24/2019,12:52 AM

## 2019-11-24 NOTE — Progress Notes (Signed)
Patient ID: EMMETTE KATT, male   DOB: [birth year], 58 y.o.   MRN: [id number]  PROGRESS NOTE    JAHAN FRIEDLANDER  PIR:[id number] DOB: [birth year] DOA: 11/23/2019 PCP: Kristopher Glee., MD   Brief Narrative:  59 year old male with history of CAD status post CABG in 07/2019, hypertension, diabetes mellitus type 2, OSA, presented on 11/23/2019 with extremely elevated blood pressure along with chest pain.  Cardiology was consulted.  Assessment & Plan:   Hypertensive urgency -Presented with extremely elevated blood pressure of 212/109.  Patient not taking losartan and isosorbide at home.  These medications have been resumed.  Continue metoprolol.  Cardiology following.  Blood pressure improving.  Chest pain in a patient with CAD status post CABG -Probably from above.  Cardiology following.  High sensitive troponin low and flat.  Doubt that patient has ACS.  Continue aspirin, statin, Imdur, metoprolol. -Plavix discontinued as per cardiology recommendation  History of PE -Continue Coumadin dose as per pharmacy.  Monitor INR  Hyperlipidemia -Continue statin  Diabetes mellitus type 2 -Continue CBGs with SSI    DVT prophylaxis: Coumadin Code Status: Full Family Communication: Patient at bedside Disposition Plan: Status is: Observation  The patient will require care spanning > 2 midnights and should be moved to inpatient because: Inpatient level of care appropriate due to severity of illness  Dispo: The patient is from: Home              Anticipated d/c is to: Home              Anticipated d/c date is: 1 day              Patient currently is not medically stable to d/c.   Consultants: Cardiology  Procedures: None  Antimicrobials: None   Subjective: Patient seen and examined at bedside.  Denies any current chest pain.  No overnight fever, worsening shortness of breath, nausea or vomiting.  Objective: Vitals:   11/24/19 0700 11/24/19 0800 11/24/19 0845 11/24/19 1128    BP: (!) 153/82 (!) 153/82 (!) 154/89 (!) 146/76  Pulse:      Resp:      Temp: 98.5 F (36.9 C)     TempSrc: Oral     SpO2:  96%    Weight:      Height:        Intake/Output Summary (Last 24 hours) at 11/24/2019 1251 Last data filed at 11/24/2019 0601 Gross per 24 hour  Intake 360 ml  Output 350 ml  Net 10 ml   Filed Weights   11/24/19 0155 11/24/19 0603  Weight: (!) 92.4 kg (!) 92.4 kg    Examination:  General exam: Appears calm and comfortable  Respiratory system: Bilateral decreased breath sounds at bases with some scattered crackles Cardiovascular system: S1 & S2 heard, Rate controlled Gastrointestinal system: Abdomen is nondistended, soft and nontender. Normal bowel sounds heard. Extremities: No cyanosis, clubbing; trace lower extremity edema Central nervous system: Alert and oriented. No focal neurological deficits. Moving extremities Skin: No rashes, lesions or ulcers Psychiatry: Judgement and insight appear normal. Mood & affect appropriate.     Data Reviewed: I have personally reviewed following labs and imaging studies  CBC: Recent Labs  Lab 11/23/19 1320 11/24/19 0115  WBC 7.6 8.4  HGB 10.8* 11.8*  HCT 37.1* 38.7*  MCV 80.3 78.7*  PLT 214 630   Basic Metabolic Panel: Recent Labs  Lab 11/23/19 1320 11/24/19 0115  NA 140 139  K 4.1 3.8  CL 104  103  CO2 24 26  GLUCOSE 140* 203*  BUN 22* 20  CREATININE 1.53* 1.31*  CALCIUM 9.5 9.4  MG  --  1.9   GFR: Estimated Creatinine Clearance: 68.6 mL/min (A) (by C-G formula based on SCr of 1.31 mg/dL (H)). Liver Function Tests: No results for input(s): AST, ALT, ALKPHOS, BILITOT, PROT, ALBUMIN in the last 168 hours. No results for input(s): LIPASE, AMYLASE in the last 168 hours. No results for input(s): AMMONIA in the last 168 hours. Coagulation Profile: Recent Labs  Lab 11/23/19 1926 11/24/19 1222  INR 2.6* 2.3*   Cardiac Enzymes: No results for input(s): CKTOTAL, CKMB, CKMBINDEX, TROPONINI in  the last 168 hours. BNP (last 3 results) No results for input(s): PROBNP in the last 8760 hours. HbA1C: Recent Labs    11/24/19 0114  HGBA1C 7.8*   CBG: Recent Labs  Lab 11/23/19 2004 11/24/19 0103 11/24/19 0755 11/24/19 1109  GLUCAP 47* 178* 122* 225*   Lipid Profile: No results for input(s): CHOL, HDL, LDLCALC, TRIG, CHOLHDL, LDLDIRECT in the last 72 hours. Thyroid Function Tests: No results for input(s): TSH, T4TOTAL, FREET4, T3FREE, THYROIDAB in the last 72 hours. Anemia Panel: No results for input(s): VITAMINB12, FOLATE, FERRITIN, TIBC, IRON, RETICCTPCT in the last 72 hours. Sepsis Labs: No results for input(s): PROCALCITON, LATICACIDVEN in the last 168 hours.  Recent Results (from the past 240 hour(s))  SARS Coronavirus 2 by RT PCR (hospital order, performed in [facility] hospital lab) Nasopharyngeal Nasopharyngeal Swab     Status: None   Collection Time: 11/23/19 11:47 PM   Specimen: Nasopharyngeal Swab  Result Value Ref Range Status   SARS Coronavirus 2 NEGATIVE NEGATIVE Final    Comment: (NOTE) SARS-CoV-2 target nucleic acids are NOT DETECTED.  The SARS-CoV-2 RNA is generally detectable in upper and lower respiratory specimens during the acute phase of infection. The lowest concentration of SARS-CoV-2 viral copies this assay can detect is 250 copies / mL. A negative result does not preclude SARS-CoV-2 infection and should not be used as the sole basis for treatment or other patient management decisions.  A negative result may occur with improper specimen collection / handling, submission of specimen other than nasopharyngeal swab, presence of viral mutation(s) within the areas targeted by this assay, and inadequate number of viral copies (<250 copies / mL). A negative result must be combined with clinical observations, patient history, and epidemiological information.  Fact Sheet for Patients:   StrictlyIdeas.no  Fact Sheet for  Healthcare Providers: BankingDealers.co.za  This test is not yet approved or  cleared by the [place] FDA and has been authorized for detection and/or diagnosis of SARS-CoV-2 by FDA under an Emergency Use Authorization (EUA).  This EUA will remain in effect (meaning this test can be used) for the duration of the COVID-19 declaration under Section 564(b)(1) of the Act, 21 U.S.C. section 360bbb-3(b)(1), unless the authorization is terminated or revoked sooner.  Performed at [facility], [place] [address], [place], [place] [address]          Radiology Studies: DG Chest 2 View  Result Date: 11/23/2019 CLINICAL DATA:  Chest pain.  Hypertension. EXAM: CHEST - 2 VIEW COMPARISON:  09/30/2019. FINDINGS: AICD in stable position. Prior median sternotomy. Cardiomegaly. No pulmonary venous congestion. Mild bibasilar atelectasis. Slight increase in previously identified left pleural effusion. No pneumothorax. Surgical clips right upper quadrant. IMPRESSION: 1. AICD in stable position. Prior median sternotomy. Cardiomegaly. No pulmonary venous congestion. 2. Mild bibasilar atelectasis. Slight increase in previously  identified left pleural effusion. Electronically Signed   By: Marcello Moores  Register   On: 11/23/2019 13:27        Scheduled Meds: . aspirin EC  81 mg Oral Daily  . atorvastatin  40 mg Oral Daily  . empagliflozin  10 mg Oral Daily  . hydrALAZINE  20 mg Intravenous Once  . insulin aspart  0-15 Units Subcutaneous TID WC  . insulin aspart  0-5 Units Subcutaneous QHS  . insulin glargine  50 Units Subcutaneous QPM  . isosorbide mononitrate  30 mg Oral Daily  . losartan  50 mg Oral Daily  . metoprolol tartrate  50 mg Oral BID  . pantoprazole  40 mg Oral BID AC  . sodium chloride flush  3 mL Intravenous Once  . warfarin  7.5 mg Oral ONCE-1600  . Warfarin - Pharmacist Dosing Inpatient   Does not apply q1600   Continuous Infusions:        Aline August, MD [facility] 11/24/2019, 12:51 PM

## 2019-11-24 NOTE — Progress Notes (Addendum)
Progress Note  Patient Name: John Dodson Date of Encounter: 11/24/2019  Primary Cardiologist:  Glenetta Hew, MD  Subjective   Breathing better. Recently had gout and after he started rx for it (probably colchicine), his BP went up, he developed SOB and started having CP.   No CP overnight, had headache from nitro paste, got rx this am.   He was sent to [facility] ER last week from cardiac rehab for high BP. They told him he had fluid, but he refused admit.   Inpatient Medications    Scheduled Meds: . aspirin EC  81 mg Oral Daily  . atorvastatin  40 mg Oral Daily  . hydrALAZINE  20 mg Intravenous Once  . insulin aspart  0-15 Units Subcutaneous TID WC  . insulin aspart  0-5 Units Subcutaneous QHS  . insulin glargine  50 Units Subcutaneous QPM  . isosorbide mononitrate  30 mg Oral Daily  . losartan  50 mg Oral Daily  . metoprolol tartrate  50 mg Oral BID  . pantoprazole  40 mg Oral BID AC  . sodium chloride flush  3 mL Intravenous Once  . warfarin  7.5 mg Oral ONCE-1600  . Warfarin - Pharmacist Dosing Inpatient   Does not apply q1600   Continuous Infusions:  PRN Meds: acetaminophen **OR** acetaminophen, senna-docusate   Vital Signs    Vitals:   11/24/19 0155 11/24/19 0204 11/24/19 0603 11/24/19 0700  BP:  (!) 158/79 (!) 149/81 (!) 153/82  Pulse:  89 94   Resp:  18    Temp:  98.4 F (36.9 C) 98.4 F (36.9 C) 98.5 F (36.9 C)  TempSrc:  Oral Oral Oral  SpO2:  97%    Weight: (!) 92.4 kg  (!) 92.4 kg   Height: 5\' 8"  (1.727 m)       Intake/Output Summary (Last 24 hours) at 11/24/2019 0815 Last data filed at 11/24/2019 0601 Gross per 24 hour  Intake 360 ml  Output 350 ml  Net 10 ml   Filed Weights   11/24/19 0155 11/24/19 0603  Weight: (!) 92.4 kg (!) 92.4 kg   Last Weight  Most recent update: 11/24/2019  6:04 AM   Weight  92.4 kg (203 lb 12.8 oz)             Weight change:    Telemetry    SR, V pacing - Personally Reviewed  ECG    07/27  ECG is SR, V pacing, RBBB - Personally Reviewed  Physical Exam   General: Well developed, well nourished, male appearing in no acute distress. Head: Normocephalic, atraumatic.  Neck: Supple without bruits, JVD mildly elevated. Lungs:  Resp regular and unlabored, decreased BS L>R bases Heart: RRR, S1, S2, no S3, S4, or murmur; no rub. Abdomen: Soft, non-tender, non-distended with normoactive bowel sounds. No hepatomegaly. No rebound/guarding. No obvious abdominal masses. Extremities: No clubbing, cyanosis, no edema. Distal pedal pulses are 2+ bilaterally. Neuro: Alert and oriented X 3. Moves all extremities spontaneously. Psych: Normal affect.  Labs    Hematology Recent Labs  Lab 11/23/19 1320 11/24/19 0115  WBC 7.6 8.4  RBC 4.62 4.92  HGB 10.8* 11.8*  HCT 37.1* 38.7*  MCV 80.3 78.7*  MCH 23.4* 24.0*  MCHC 29.1* 30.5  RDW 14.8 14.9  PLT 214 244    Chemistry Recent Labs  Lab 11/23/19 1320 11/24/19 0115  NA 140 139  K 4.1 3.8  CL 104 103  CO2 24 26  GLUCOSE 140* 203*  BUN 22* 20  CREATININE 1.53* 1.31*  CALCIUM 9.5 9.4  GFRNONAA 50* >60  GFRAA 58* >60  ANIONGAP 12 10     High Sensitivity Troponin:   Recent Labs  Lab 11/23/19 1320 11/23/19 1640  TROPONINIHS 20* 19*      BNPNo results for input(s): BNP, PROBNP in the last 168 hours.   DDimer No results for input(s): DDIMER in the last 168 hours.   No results found for: TSH Lab Results  Component Value Date   HGBA1C 7.8 (H) 11/24/2019   Lab Results  Component Value Date   CHOL 122 08/24/2019   HDL 29 (L) 08/24/2019   LDLCALC 64 08/24/2019   TRIG 78 08/25/2019   CHOLHDL 4.2 08/24/2019   Lab Results  Component Value Date   INR 2.6 (H) 11/23/2019   INR 1.5 (H) 08/25/2019   INR 1.0 08/25/2019    Radiology    DG Chest 2 View  Result Date: 11/23/2019 CLINICAL DATA:  Chest pain.  Hypertension. EXAM: CHEST - 2 VIEW COMPARISON:  09/30/2019. FINDINGS: AICD in stable position. Prior median  sternotomy. Cardiomegaly. No pulmonary venous congestion. Mild bibasilar atelectasis. Slight increase in previously identified left pleural effusion. No pneumothorax. Surgical clips right upper quadrant. IMPRESSION: 1. AICD in stable position. Prior median sternotomy. Cardiomegaly. No pulmonary venous congestion. 2. Mild bibasilar atelectasis. Slight increase in previously identified left pleural effusion. Electronically Signed   By: Marcello Moores  Register   On: 11/23/2019 13:27     Cardiac Studies   ECHO:  ordered  Patient Profile     59 y.o. male w/ hx  CKD 3, coronary artery disease status post coronary artery bypass grafting in April 2021 multiple prior PCI, complete heart block status post biventricular pacemaker, diabetes, +COVID & PE 04/2019, was admitted 07/27 w/ CP, SOB.   Assessment & Plan    1. CP:  - mild elevation in trop more c/w uncontrolled HTN or volume overload - no evidence of early graft failure - no ischemic eval indicated as long as echo is ok  2. Volume overload - pt with mild volume overload, improved w/ IV Lasix ([facility]NP in [facility] 07/23 was 803) - will give another dose this am, then should be ok for oral rx - pt would benefit from [place] or Farxiga, will ask Case Mgt to assist w/ cost eval>>could be a stopper  3. HTN - BP improved some after diuretic, still too high - pt says BP went up after rx for gout - losartan 50 mg started last pm, stopped out of concern for hyperkalemia - however, K+ has been high normal, peak 4.8 when on losartan - think ok to resume, but will have to follow labs - Dr Blossom Hoops also added metoprolol 50 mg bid  4. CHB - s/p [employer] percepta BiV PPM - will plug him in w/ EP here, can do as oupt.  - needs regular device interrogation   Active Problems:   CAD S/P PCI   Hyperlipidemia with target LDL less than 70   Essential hypertension   Type 2 diabetes mellitus with complication, with long-term current use of insulin (HCC)   OSA  treated with BiPAP   History of complete heart block   CKD (chronic kidney disease), stage II   Chest pain    Jonetta Speak , PA-C 8:15 AM 11/24/2019 Pager: [phone]

## 2019-11-25 DIAGNOSIS — I160 Hypertensive urgency: Secondary | ICD-10-CM | POA: Diagnosis not present

## 2019-11-25 DIAGNOSIS — R072 Precordial pain: Secondary | ICD-10-CM | POA: Diagnosis not present

## 2019-11-25 DIAGNOSIS — I10 Essential (primary) hypertension: Secondary | ICD-10-CM | POA: Diagnosis not present

## 2019-11-25 DIAGNOSIS — E118 Type 2 diabetes mellitus with unspecified complications: Secondary | ICD-10-CM | POA: Diagnosis not present

## 2019-11-25 DIAGNOSIS — N182 Chronic kidney disease, stage 2 (mild): Secondary | ICD-10-CM | POA: Diagnosis not present

## 2019-11-25 DIAGNOSIS — Z794 Long term (current) use of insulin: Secondary | ICD-10-CM | POA: Diagnosis not present

## 2019-11-25 DIAGNOSIS — R079 Chest pain, unspecified: Secondary | ICD-10-CM | POA: Diagnosis not present

## 2019-11-25 LAB — BASIC METABOLIC PANEL
Anion gap: 11 (ref 5–15)
BUN: 19 mg/dL (ref 6–20)
CO2: 27 mmol/L (ref 22–32)
Calcium: 9.4 mg/dL (ref 8.9–10.3)
Chloride: 103 mmol/L (ref 98–111)
Creatinine, Ser: 1.55 mg/dL — ABNORMAL HIGH (ref 0.61–1.24)
GFR calc Af Amer: 57 mL/min — ABNORMAL LOW (ref >60)
GFR calc non Af Amer: 49 mL/min — ABNORMAL LOW (ref >60)
Glucose, Bld: 93 mg/dL (ref 70–99)
Potassium: 4.3 mmol/L (ref 3.5–5.1)
Sodium: 141 mmol/L (ref 135–145)

## 2019-11-25 LAB — MAGNESIUM: Magnesium: 2 mg/dL (ref 1.7–2.4)

## 2019-11-25 LAB — PROTIME-INR
INR: 2.1 — ABNORMAL HIGH (ref 0.8–1.2)
Prothrombin Time: 23.1 seconds — ABNORMAL HIGH (ref 11.4–15.2)

## 2019-11-25 LAB — GLUCOSE, CAPILLARY: Glucose-Capillary: 74 mg/dL (ref 70–99)

## 2019-11-25 MED ORDER — AMLODIPINE BESYLATE 5 MG PO TABS: 5.0000 mg | ORAL_TABLET | Freq: Every day | ORAL | Status: DC

## 2019-11-25 MED ORDER — CARVEDILOL 12.5 MG PO TABS: 12.5000 mg | ORAL_TABLET | Freq: Two times a day (BID) | ORAL | 0 refills | Status: DC

## 2019-11-25 MED ORDER — CLOPIDOGREL BISULFATE 75 MG PO TABS
75.0000 mg | ORAL_TABLET | Freq: Every day | ORAL | Status: DC
  Administered 2019-11-25: 75 mg via ORAL

## 2019-11-25 MED ORDER — CARVEDILOL 12.5 MG PO TABS: 12.5000 mg | ORAL_TABLET | Freq: Two times a day (BID) | ORAL | Status: DC

## 2019-11-25 MED ORDER — AMLODIPINE BESYLATE 5 MG PO TABS: 5.0000 mg | ORAL_TABLET | Freq: Every day | ORAL | 0 refills | Status: DC

## 2019-11-25 MED ORDER — WARFARIN SODIUM 7.5 MG PO TABS: 7.5000 mg | ORAL_TABLET | Freq: Once | ORAL | Status: DC

## 2019-11-25 MED ORDER — LOSARTAN POTASSIUM 50 MG PO TABS: 50.0000 mg | ORAL_TABLET | Freq: Every day | ORAL | 0 refills | Status: DC

## 2019-11-25 MED ORDER — ONDANSETRON HCL 4 MG PO TABS: 4.0000 mg | ORAL_TABLET | Freq: Three times a day (TID) | ORAL | 0 refills | Status: DC | PRN

## 2019-11-25 MED ORDER — EMPAGLIFLOZIN 10 MG PO TABS: 10.0000 mg | ORAL_TABLET | Freq: Every day | ORAL | 0 refills | Status: DC

## 2019-11-25 MED ORDER — FUROSEMIDE 40 MG PO TABS
40.0000 mg | ORAL_TABLET | Freq: Every day | ORAL | 0 refills | Status: DC | PRN
Start: 2019-11-25 — End: 2019-12-17

## 2019-11-25 NOTE — Discharge Summary (Signed)
Physician Discharge Summary  John Dodson OZD:[id number] DOB: 06-Jul-1960 DOA: 11/23/2019  PCP: Kristopher Glee., MD  Admit date: 11/23/2019 Discharge date: 11/25/2019  Admitted From: Home Disposition: Home  Recommendations for Outpatient Follow-up:  1. Follow up with PCP in 1 week with repeat CBC/BMP/INR.  Coumadin to be dosed as per repeat INR outpatient by PCP. 2. Outpatient follow-up with cardiology 3. Follow up in ED if symptoms worsen or new appear   Home Health: No Equipment/Devices: None  Discharge Condition: Stable CODE STATUS: Full Diet recommendation: Heart healthy/carb modified  Brief/Interim Summary: 59 year old male with history of CAD status post CABG in 07/2019, hypertension, diabetes mellitus type 2, OSA, presented on 11/23/2019 with extremely elevated blood pressure along with chest pain.  Cardiology was consulted.  During hospitalization, blood pressure has improved and chest pain has improved.  He is tolerating diet.  Cardiology has made some changes with his antihypertensive regimen.  Cardiology has cleared the patient for discharge with outpatient follow-up with cardiology.  He will be discharged home today.  Discharge Diagnoses:   Hypertensive urgency -Presented with extremely elevated blood pressure of 212/109.  Patient not taking losartan and isosorbide at home.  Cardiology following and has made adjustment in his antihypertensive regimen.  Cardiology recommends amlodipine 5 mg daily, Coreg 12.5 mg twice a day, losartan 50 mg daily along with Lasix as needed for 3 pound weight gain overnight and/or 5 pound weight gain over baseline. -Blood pressure has much improved.  Cardiology has cleared the patient for discharge.  Discharge patient home today.  Chest pain in a patient with CAD status post CABG -Probably from above.  Cardiology following.  High sensitive troponin low and flat.  Doubt that patient has ACS.  Continue aspirin, statin.  Imdur discontinued  because of headache.  Metoprolol has been substituted by Coreg. -Plavix resumed by cardiology in hopes that Coumadin will be discontinued by PCP soon as an outpatient.  History of PE -Continue Coumadin for now but cardiology recommending that Coumadin be discontinued as an outpatient by PCP.  Hyperlipidemia -Continue statin  Diabetes mellitus type 2 -Continue Lantus.  Metformin will remain on hold till reevaluation by PCP.  Patient has been started on Jardiance by cardiology which will be continued.  Discharge Instructions  Discharge Instructions    Diet - low sodium heart healthy   Complete by: As directed    Diet Carb Modified   Complete by: As directed    Increase activity slowly   Complete by: As directed      Allergies as of 11/25/2019      Reactions   Latex Rash   Codeine Nausea And Vomiting   Contrast Media [iodinated Diagnostic Agents]    Reportedly cardiac arrest   Integrilin [eptifibatide]    Reportedly had shortness of breath, confusion.   Tylenol [acetaminophen]    Tylenol -3 with codiene   Zithromax [azithromycin] Nausea And Vomiting   Glipizide Rash   Headache      Medication List    STOP taking these medications   colchicine 0.6 MG tablet   HYDROcodone-homatropine 5-1.5 MG/5ML syrup Commonly known as: HYCODAN   isosorbide mononitrate 30 MG 24 hr tablet Commonly known as: IMDUR   metFORMIN 500 MG tablet Commonly known as: GLUCOPHAGE   metoprolol tartrate 50 MG tablet Commonly known as: LOPRESSOR   Potassium Chloride ER 20 MEQ Tbcr   traMADol 50 MG tablet Commonly known as: ULTRAM     TAKE these medications   albuterol 108 (90  Base) MCG/ACT inhaler Commonly known as: VENTOLIN HFA Inhale 2 puffs into the lungs every 4 (four) hours as needed for wheezing or shortness of breath.   amLODipine 5 MG tablet Commonly known as: NORVASC Take 1 tablet (5 mg total) by mouth daily. Start taking on: November 26, 2019   aspirin 81 MG EC tablet Take  1 tablet (81 mg total) by mouth daily.   atorvastatin 40 MG tablet Commonly known as: LIPITOR Take 40 mg by mouth daily.   calcium-vitamin D 500-200 MG-UNIT tablet Commonly known as: OSCAL WITH D Take 1 tablet by mouth daily. 50 MCG (2000UNIT ) TAB CHOLECALCIFEROL   carvedilol 12.5 MG tablet Commonly known as: COREG Take 1 tablet (12.5 mg total) by mouth 2 (two) times daily with a meal.   clopidogrel 75 MG tablet Commonly known as: PLAVIX Take 75 mg by mouth daily.   empagliflozin 10 MG Tabs tablet Commonly known as: JARDIANCE Take 1 tablet (10 mg total) by mouth daily. Start taking on: November 26, 2019   Fish Oil Burp-Less 1000 MG Caps Take 1,000 mg by mouth 2 (two) times daily.   furosemide 40 MG tablet Commonly known as: LASIX Take 1 tablet (40 mg total) by mouth daily as needed for fluid or edema (Poor more than 3 pound weight gain overnight or more than 5 pound weight gain over baseline). What changed:   when to take this  reasons to take this   Lantus SoloStar 100 UNIT/ML Solostar Pen Generic drug: insulin glargine Inject 50 Units into the skin every evening. 100UNITS/3 ML   losartan 50 MG tablet Commonly known as: COZAAR Take 1 tablet (50 mg total) by mouth daily.   MAGNESIUM OXIDE PO Take 500 mg by mouth daily. TAKE 1  TABLET BY MOUTH NIGHTLY FOR LEG CRAMPS   Nitrostat 0.4 MG SL tablet Generic drug: nitroGLYCERIN Place 0.4 mg under the tongue every 5 (five) minutes as needed for chest pain.   ondansetron 4 MG tablet Commonly known as: Zofran Take 1 tablet (4 mg total) by mouth every 8 (eight) hours as needed for nausea or vomiting. What changed:   medication strength  how much to take   pantoprazole 40 MG tablet Commonly known as: PROTONIX Take 40 mg by mouth 2 (two) times daily before a meal.   warfarin 5 MG tablet Commonly known as: COUMADIN Take 5-7.5 mg by mouth See admin instructions. Take 1 and 1/2 tablets on Monday, Wednesday and Friday  then take 1 tablet all the other days       Follow-up Information    Evans Lance, MD Follow up.   Specialty: Cardiology Why: 12/17/2019 @ 11:00AM, (pacemaker care and management) Contact information: [address] [place] [address] [phone]        Kristopher Glee., MD. Schedule an appointment as soon as possible for a visit in 1 week(s).   Specialty: Internal Medicine Why: with repeat cbc/bmp Contact information: [address] [place] [address] [phone]        Leonie Man, MD .   Specialty: Cardiology Contact information: [address] [place] [address] [place] [address] [phone]              Allergies  Allergen Reactions  . Latex Rash  . Codeine Nausea And Vomiting  . Contrast Media [Iodinated Diagnostic Agents]     Reportedly cardiac arrest  . Integrilin [Eptifibatide]     Reportedly had shortness of breath,  confusion.  . Tylenol [Acetaminophen]     Tylenol -3 with codiene  . Zithromax [Azithromycin] Nausea And Vomiting  . Glipizide Rash    Headache    Consultations:  Cardiology   Procedures/Studies: DG Chest 2 View  Result Date: 11/23/2019 CLINICAL DATA:  Chest pain.  Hypertension. EXAM: CHEST - 2 VIEW COMPARISON:  09/30/2019. FINDINGS: AICD in stable position. Prior median sternotomy. Cardiomegaly. No pulmonary venous congestion. Mild bibasilar atelectasis. Slight increase in previously identified left pleural effusion. No pneumothorax. Surgical clips right upper quadrant. IMPRESSION: 1. AICD in stable position. Prior median sternotomy. Cardiomegaly. No pulmonary venous congestion. 2. Mild bibasilar atelectasis. Slight increase in previously identified left pleural effusion. Electronically Signed   By: Marcello Moores  Register   On: 11/23/2019 13:27       Subjective: Patient seen and examined at bedside.   Denies any current nausea or vomiting.  No current chest pain.  Complains of mild  intermittent headache.  Wants to go home..  No overnight fever or vomiting. Discharge Exam: Vitals:   11/25/19 0729 11/25/19 0954  BP: (!) 147/81 (!) 157/84  Pulse: 95 (!) 109  Resp: 18   Temp: 98.1 F (36.7 C)   SpO2: 96%     General: Pt is alert, awake, not in acute distress Cardiovascular: Mild intermittent tachycardia, S1/S2 + Respiratory: bilateral decreased breath sounds at bases, no wheezing Abdominal: Soft, NT, ND, bowel sounds + Extremities: Trace lower extremity edema; no cyanosis    The results of significant diagnostics from this hospitalization (including imaging, microbiology, ancillary and laboratory) are listed below for reference.     Microbiology: Recent Results (from the past 240 hour(s))  SARS Coronavirus 2 by RT PCR (hospital order, performed in [facility] hospital lab) Nasopharyngeal Nasopharyngeal Swab     Status: None   Collection Time: 11/23/19 11:47 PM   Specimen: Nasopharyngeal Swab  Result Value Ref Range Status   SARS Coronavirus 2 NEGATIVE NEGATIVE Final    Comment: (NOTE) SARS-CoV-2 target nucleic acids are NOT DETECTED.  The SARS-CoV-2 RNA is generally detectable in upper and lower respiratory specimens during the acute phase of infection. The lowest concentration of SARS-CoV-2 viral copies this assay can detect is 250 copies / mL. A negative result does not preclude SARS-CoV-2 infection and should not be used as the sole basis for treatment or other patient management decisions.  A negative result may occur with improper specimen collection / handling, submission of specimen other than nasopharyngeal swab, presence of viral mutation(s) within the areas targeted by this assay, and inadequate number of viral copies (<250 copies / mL). A negative result must be combined with clinical observations, patient history, and epidemiological information.  Fact Sheet for Patients:   StrictlyIdeas.no  Fact Sheet for  Healthcare Providers: BankingDealers.co.za  This test is not yet approved or  cleared by the [place] FDA and has been authorized for detection and/or diagnosis of SARS-CoV-2 by FDA under an Emergency Use Authorization (EUA).  This EUA will remain in effect (meaning this test can be used) for the duration of the COVID-19 declaration under Section 564(b)(1) of the Act, 21 U.S.C. section 360bbb-3(b)(1), unless the authorization is terminated or revoked sooner.  Performed at [facility], [place] [address] [place], [place] [address]      Labs: BNP (last 3 results) No results for input(s): BNP in the last 8760 hours. Basic Metabolic Panel: Recent Labs  Lab 11/23/19 1320 11/24/19 0115 11/25/19 0405  NA 140 139 141  K  4.1 3.8 4.3  CL 104 103 103  CO2 24 26 27   GLUCOSE 140* 203* 93  BUN 22* 20 19  CREATININE 1.53* 1.31* 1.55*  CALCIUM 9.5 9.4 9.4  MG  --  1.9 2.0   Liver Function Tests: No results for input(s): AST, ALT, ALKPHOS, BILITOT, PROT, ALBUMIN in the last 168 hours. No results for input(s): LIPASE, AMYLASE in the last 168 hours. No results for input(s): AMMONIA in the last 168 hours. CBC: Recent Labs  Lab 11/23/19 1320 11/24/19 0115  WBC 7.6 8.4  HGB 10.8* 11.8*  HCT 37.1* 38.7*  MCV 80.3 78.7*  PLT 214 244   Cardiac Enzymes: No results for input(s): CKTOTAL, CKMB, CKMBINDEX, TROPONINI in the last 168 hours. BNP: Invalid input(s): POCBNP CBG: Recent Labs  Lab 11/24/19 0755 11/24/19 1109 11/24/19 1658 11/24/19 2152 11/25/19 0728  GLUCAP 122* 225* 109* 125* 74   D-Dimer No results for input(s): DDIMER in the last 72 hours. Hgb A1c Recent Labs    11/24/19 0114  HGBA1C 7.8*   Lipid Profile No results for input(s): CHOL, HDL, LDLCALC, TRIG, CHOLHDL, LDLDIRECT in the last 72 hours. Thyroid function studies No results for input(s): TSH, T4TOTAL, T3FREE, THYROIDAB in the last 72 hours.  Invalid input(s):  FREET3 Anemia work up No results for input(s): VITAMINB12, FOLATE, FERRITIN, TIBC, IRON, RETICCTPCT in the last 72 hours. Urinalysis    Component Value Date/Time   COLORURINE YELLOW 08/24/2019 1717   APPEARANCEUR CLEAR 08/24/2019 1717   LABSPEC 1.009 08/24/2019 1717   PHURINE 7.0 08/24/2019 1717   GLUCOSEU 50 (A) 08/24/2019 1717   HGBUR NEGATIVE 08/24/2019 1717   BILIRUBINUR NEGATIVE 08/24/2019 1717   KETONESUR NEGATIVE 08/24/2019 1717   PROTEINUR NEGATIVE 08/24/2019 1717   NITRITE NEGATIVE 08/24/2019 1717   LEUKOCYTESUR SMALL (A) 08/24/2019 1717   Sepsis Labs Invalid input(s): PROCALCITONIN,  WBC,  LACTICIDVEN Microbiology Recent Results (from the past 240 hour(s))  SARS Coronavirus 2 by RT PCR (hospital order, performed in [facility] hospital lab) Nasopharyngeal Nasopharyngeal Swab     Status: None   Collection Time: 11/23/19 11:47 PM   Specimen: Nasopharyngeal Swab  Result Value Ref Range Status   SARS Coronavirus 2 NEGATIVE NEGATIVE Final    Comment: (NOTE) SARS-CoV-2 target nucleic acids are NOT DETECTED.  The SARS-CoV-2 RNA is generally detectable in upper and lower respiratory specimens during the acute phase of infection. The lowest concentration of SARS-CoV-2 viral copies this assay can detect is 250 copies / mL. A negative result does not preclude SARS-CoV-2 infection and should not be used as the sole basis for treatment or other patient management decisions.  A negative result may occur with improper specimen collection / handling, submission of specimen other than nasopharyngeal swab, presence of viral mutation(s) within the areas targeted by this assay, and inadequate number of viral copies (<250 copies / mL). A negative result must be combined with clinical observations, patient history, and epidemiological information.  Fact Sheet for Patients:   StrictlyIdeas.no  Fact Sheet for Healthcare  Providers: BankingDealers.co.za  This test is not yet approved or  cleared by the [place] FDA and has been authorized for detection and/or diagnosis of SARS-CoV-2 by FDA under an Emergency Use Authorization (EUA).  This EUA will remain in effect (meaning this test can be used) for the duration of the COVID-19 declaration under Section 564(b)(1) of the Act, 21 U.S.C. section 360bbb-3(b)(1), unless the authorization is terminated or revoked sooner.  Performed at [facility], [address] [place], [place] [address]      Time coordinating discharge: 35 minutes  SIGNED:   Aline August, MD  [facility] 11/25/2019, 11:38 AM

## 2019-11-25 NOTE — Progress Notes (Addendum)
Progress Note  Patient Name: John Dodson Date of Encounter: 11/25/2019  [facility] Cardiologist: Glenetta Hew, MD   Subjective   Denies any CP or SOB.   Inpatient Medications    Scheduled Meds: . aspirin EC  81 mg Oral Daily  . atorvastatin  40 mg Oral Daily  . empagliflozin  10 mg Oral Daily  . hydrALAZINE  20 mg Intravenous Once  . insulin aspart  0-15 Units Subcutaneous TID WC  . insulin aspart  0-5 Units Subcutaneous QHS  . insulin glargine  50 Units Subcutaneous QPM  . isosorbide mononitrate  30 mg Oral Daily  . losartan  50 mg Oral Daily  . metoprolol tartrate  50 mg Oral BID  . pantoprazole  40 mg Oral BID AC  . sodium chloride flush  3 mL Intravenous Once  . warfarin  7.5 mg Oral ONCE-1600  . warfarin  7.5 mg Oral ONCE-1600  . Warfarin - Pharmacist Dosing Inpatient   Does not apply q1600   Continuous Infusions:  PRN Meds: acetaminophen **OR** acetaminophen, morphine injection, ondansetron (ZOFRAN) IV, senna-docusate   Vital Signs    Vitals:   11/25/19 0125 11/25/19 0455 11/25/19 0729 11/25/19 0954  BP: (!) 150/81  (!) 147/81 (!) 157/84  Pulse: 82 85 95 (!) 109  Resp: 19  18   Temp: 98.4 F (36.9 C) 98.9 F (37.2 C) 98.1 F (36.7 C)   TempSrc: Oral Oral Oral   SpO2: 96% 100% 96%   Weight:  88.1 kg    Height:        Intake/Output Summary (Last 24 hours) at 11/25/2019 1026 Last data filed at 11/25/2019 0826 Gross per 24 hour  Intake 840 ml  Output 750 ml  Net 90 ml   Last 3 Weights 11/25/2019 11/24/2019 11/24/2019  Weight (lbs) 194 lb 4.8 oz 203 lb 12.8 oz 203 lb 12.8 oz  Weight (kg) 88.134 kg 92.443 kg 92.443 kg      Telemetry    Paced rhythm, mild tachycardia - Personally Reviewed  ECG    Paced rhythm HR 70 - Personally Reviewed  Physical Exam   GEN: No acute distress.   Neck: No JVD Cardiac: RRR, no murmurs, rubs, or gallops.  Respiratory: Clear to auscultation bilaterally. GI: Soft, nontender, non-distended  MS: No  edema; No deformity. Neuro:  Nonfocal  Psych: Normal affect   Labs    High Sensitivity Troponin:   Recent Labs  Lab 11/23/19 1320 11/23/19 1640  TROPONINIHS 20* 19*      Chemistry Recent Labs  Lab 11/23/19 1320 11/24/19 0115 11/25/19 0405  NA 140 139 141  K 4.1 3.8 4.3  CL 104 103 103  CO2 24 26 27   GLUCOSE 140* 203* 93  BUN 22* 20 19  CREATININE 1.53* 1.31* 1.55*  CALCIUM 9.5 9.4 9.4  GFRNONAA 50* >60 49*  GFRAA 58* >60 57*  ANIONGAP 12 10 11      Hematology Recent Labs  Lab 11/23/19 1320 11/24/19 0115  WBC 7.6 8.4  RBC 4.62 4.92  HGB 10.8* 11.8*  HCT 37.1* 38.7*  MCV 80.3 78.7*  MCH 23.4* 24.0*  MCHC 29.1* 30.5  RDW 14.8 14.9  PLT 214 244    BNPNo results for input(s): BNP, PROBNP in the last 168 hours.   DDimer No results for input(s): DDIMER in the last 168 hours.   Radiology    DG Chest 2 View  Result Date: 11/23/2019 CLINICAL DATA:  Chest pain.  Hypertension. EXAM: CHEST -  2 VIEW COMPARISON:  09/30/2019. FINDINGS: AICD in stable position. Prior median sternotomy. Cardiomegaly. No pulmonary venous congestion. Mild bibasilar atelectasis. Slight increase in previously identified left pleural effusion. No pneumothorax. Surgical clips right upper quadrant. IMPRESSION: 1. AICD in stable position. Prior median sternotomy. Cardiomegaly. No pulmonary venous congestion. 2. Mild bibasilar atelectasis. Slight increase in previously identified left pleural effusion. Electronically Signed   By: Marcello Moores  Register   On: 11/23/2019 13:27    Cardiac Studies   Echo 08/21/2019 1. Left ventricular ejection fraction, by estimation, is 55 to 60%. The  left ventricle has normal function. Left ventricular endocardial border  not optimally defined to evaluate regional wall motion. There is moderate  concentric left ventricular  hypertrophy. Left ventricular diastolic parameters are consistent with  Grade I diastolic dysfunction (impaired relaxation).  2. Right  ventricular systolic function is normal. The right ventricular  size is normal. There is borderline elevated pulmonary artery systolic  pressure. The estimated right ventricular systolic pressure is 71.2 mmHg.  3. The mitral valve is normal in structure. Trivial mitral valve  regurgitation. No evidence of mitral stenosis.  4. Tricuspid valve regurgitation is mild to moderate.  5. The aortic valve is normal in structure. Aortic valve regurgitation is  not visualized. No aortic stenosis is present.  6. The inferior vena cava is normal in size with greater than 50%  respiratory variability, suggesting right atrial pressure of 3 mmHg.   Comparison(s): No prior Echocardiogram.   Patient Profile     59 y.o. male with PMH of CAD s/p CABG 07/2019, CHB s/p BiV PPM, DM II, COVID & PE 04/2019 admitted with CP and SOB.  Assessment & Plan    1. Chest pain  - flat Hs Trop, likely demand ischemia from elevated BP and volume overload  - no further ischemic work up, he ambulated twice this morning without chest pain  - stable for discharge. Given mild rise of Cr, need BMET in 1 week  2. Volume overload: appears to be euvolemic, on Jardiance. Benefit check shows it is affordable at $47. PRN lasix 40mg  for swelling. Suspect the mild diuretic effect of Jardiance will be good enough otherwise.   3. CHB s/p [employer] BiV PPM  4. Hypertensive urgency: SBP still in the 140s-150s range. Currently on 50mg  daily losartan, 30mg  daily imdur and 50mg  BID metoprolol tartrate. Discussed with Dr. Harrell Gave, will switch metoprolol to coreg 12.5mg  BID. Given significant headache, will switch Imdur to amlodipine 5mg  daily.   5. HLD: continue statin  6. DM II: started on Jardiance  7. OSA on BiPAP  8. COVID and PE: 04/2019, will defer to PCP to stop Coumadin       For questions or updates, please contact [place] Please consult [email or web address] for contact info under        Signed, Almyra Deforest, [place]    11/25/2019, 10:26 AM

## 2019-11-25 NOTE — Progress Notes (Signed)
[facility] for coumadin Indication: hx of pulmonary embolus  Allergies  Allergen Reactions  . Latex Rash  . Codeine Nausea And Vomiting  . Contrast Media [Iodinated Diagnostic Agents]     Reportedly cardiac arrest  . Integrilin [Eptifibatide]     Reportedly had shortness of breath, confusion.  . Tylenol [Acetaminophen]     Tylenol -3 with codiene  . Zithromax [Azithromycin] Nausea And Vomiting  . Glipizide Rash    Headache    Vital Signs: Temp: 98.1 F (36.7 C) (07/29 0729) Temp Source: Oral (07/29 0729) BP: 147/81 (07/29 0729) Pulse Rate: 95 (07/29 0729)  Labs: Recent Labs    11/23/19 1320 11/23/19 1640 11/23/19 1926 11/24/19 0115 11/24/19 1222 11/25/19 0405  HGB 10.8*  --   --  11.8*  --   --   HCT 37.1*  --   --  38.7*  --   --   PLT 214  --   --  244  --   --   LABPROT  --   --  26.7*  --  24.7* 23.1*  INR  --   --  2.6*  --  2.3* 2.1*  CREATININE 1.53*  --   --  1.31*  --  1.55*  TROPONINIHS 20* 19*  --   --   --   --     Estimated Creatinine Clearance: 56.7 mL/min (A) (by C-G formula based on SCr of 1.55 mg/dL (H)).   Medical History: Past Medical History:  Diagnosis Date  . Blepharitis of both eyes    Chronic  . Cataracts, both eyes   . CKD (chronic kidney disease) stage 3, GFR 30-59 ml/min    Status post right nephrectomy/adrenalectomy and diabetic nephropathy (baseline creatinine 1.4-1.5)  . Coronary artery disease involving native coronary artery 2015   (No cath report prior to 2016 noted, but as of 2016, had stents in LAD, OM and L PDA) as documented since now in proximal LAD, OM 2-2 stents, L PDA at least 3 if not 4 stents, and also now nondominant RCA.  Marland Kitchen COVID-19 virus infection 04/25/2019  . Diabetic peripheral neuropathy associated with type 2 diabetes mellitus ([place])   . Essential hypertension   . GERD without esophagitis   . History of basal cell carcinoma (BCC) excision   . History of complete heart  block 06/2016   Status post pacemaker placement  . History of non-ST elevation myocardial infarction (NSTEMI)    And several occasions of unstable angina  . Hyperlipidemia associated with type 2 diabetes mellitus [facility])    Per PCP note in February 2021, was on atorvastatin 40 mg.  . Obesity (BMI 30.0-34.9)   . Occlusion of left vertebral artery    Consider repossible acute lesion in August 2020 with presentation of headache.  CTA suggested occluded left vertebral artery throughout the neck.  Faint string-like enhancement intermittently visible suggesting recent occlusion.  Partial reconstruction and posterior fossa.  Left PICA patent.  (Consider possible left vertebral artery dissection)  . OSA treated with BiPAP 02/2019   Diagnosis in late 2020 ([facility])--> delayed onset of treatment with BiPAP due to Covid hospitalization followed by PE. ->  BiPAP setting at 21/17 cm  . Pulmonary embolism ([place]) 05/18/2019   (1 month following COVID-19 infection): DVT-PE (bilateral PEs noted on VQ scan)-> started on warfarin.  (On warfarin plus Plavix now with aspirin stopped.)  . Type 2 diabetes mellitus with complication, with long-term current use of insulin (  HCC)    On Lantus, Jardiance, Metformin & Onglyza   Medications:  Coumadin 7.5 mg MWF, 5 mg other days  Assessment: 59 yo man to continue home long-term coumadin for h/o PE. INR today is therapeutic at 2.1 Last dose pta on 7/26, admitted evening of 7/27 and dose unintentionally not given. Baseline CBC low but around pt's baseline. Most recent CBC stable.   Will give higher dose compared to scheduled home dose today d/t downtrending INR and recently missed dose.  Goal of Therapy:  INR 2-3 Monitor platelets by anticoagulation protocol: Yes   Plan:  Coumadin 7.5 mg today at 16:00 Daily PT/INR Monitor for bleeding complications and drug interactions  Fara Olden, PharmD PGY-1 Pharmacy Resident 11/25/2019 9:35 AM  Please check  [email or web address] for unit-specific pharmacy phone numbers.

## 2019-11-25 NOTE — Discharge Instructions (Signed)

## 2019-12-17 ENCOUNTER — Ambulatory Visit: Payer: Medicare Other | Admitting: Internal Medicine

## 2019-12-17 ENCOUNTER — Telehealth: Payer: Self-pay

## 2019-12-17 ENCOUNTER — Other Ambulatory Visit: Payer: Self-pay

## 2019-12-17 ENCOUNTER — Encounter: Payer: Self-pay | Admitting: Internal Medicine

## 2019-12-17 DIAGNOSIS — I442 Atrioventricular block, complete: Secondary | ICD-10-CM | POA: Insufficient documentation

## 2019-12-17 DIAGNOSIS — Z950 Presence of cardiac pacemaker: Secondary | ICD-10-CM | POA: Diagnosis not present

## 2019-12-17 MED ORDER — AMLODIPINE BESYLATE 5 MG PO TABS
5.0000 mg | ORAL_TABLET | Freq: Every day | ORAL | 3 refills | Status: DC
Start: 2019-12-17 — End: 2023-01-16

## 2019-12-17 MED ORDER — ATORVASTATIN CALCIUM 40 MG PO TABS: 40.0000 mg | ORAL_TABLET | Freq: Every day | ORAL | 3 refills | Status: DC

## 2019-12-17 MED ORDER — CARVEDILOL 12.5 MG PO TABS: 12.5000 mg | ORAL_TABLET | Freq: Two times a day (BID) | ORAL | 3 refills | Status: AC

## 2019-12-17 MED ORDER — FUROSEMIDE 40 MG PO TABS: 40.0000 mg | ORAL_TABLET | Freq: Every day | ORAL | 11 refills | Status: DC | PRN

## 2019-12-17 MED ORDER — LOSARTAN POTASSIUM 50 MG PO TABS
50.0000 mg | ORAL_TABLET | Freq: Every day | ORAL | 3 refills | Status: DC
Start: 2019-12-17 — End: 2021-03-20

## 2019-12-17 MED ORDER — NITROGLYCERIN 0.4 MG SL SUBL: 0.4000 mg | SUBLINGUAL_TABLET | SUBLINGUAL | 3 refills | Status: DC | PRN

## 2019-12-17 MED ORDER — CLOPIDOGREL BISULFATE 75 MG PO TABS
75.0000 mg | ORAL_TABLET | Freq: Every day | ORAL | 3 refills | Status: DC
Start: 2019-12-17 — End: 2021-02-05

## 2019-12-17 MED ORDER — EMPAGLIFLOZIN 10 MG PO TABS
10.0000 mg | ORAL_TABLET | Freq: Every day | ORAL | 0 refills | Status: DC
Start: 2019-12-17 — End: 2020-06-06

## 2019-12-17 NOTE — Telephone Encounter (Signed)
I called [facility] [phone]  to get the pt released. LMOVM .

## 2019-12-17 NOTE — Patient Instructions (Addendum)
Medication Instructions:  Your physician recommends that you continue on your current medications as directed. Please refer to the Current Medication list given to you today.  Jardiance --call your primary care doctor for future refills  Labwork: None ordered.  Testing/Procedures: None ordered.  Follow-Up: Your physician wants you to follow-up in: one year with Dr. Lovena Le.   You will receive a reminder letter in the mail two months in advance. If you don't receive a letter, please call our office to schedule the follow-up appointment.  Remote monitoring is used to monitor your Pacemaker from home.  You will get a call from our device clinic to get you set up with remote monitoring.  Device clinic [phone]  Any Other Special Instructions Will Be Listed Below (If Applicable).  If you need a refill on your cardiac medications before your next appointment, please call your pharmacy.

## 2019-12-17 NOTE — Progress Notes (Signed)
HPI John Dodson is referred today by Dr. Wilhemina Cash for ongoing evaluation and management. He is a pleasant 59 yo man with an ICM, chronic systolic CHF and CHB, s/p PPM insertion. The patient has decided to transfer care from [facility] to [place]. He denies chest pain or sob. No syncope.  No edema.  Allergies  Allergen Reactions  . Latex Rash  . Codeine Nausea And Vomiting  . Contrast Media [Iodinated Diagnostic Agents]     Reportedly cardiac arrest  . Integrilin [Eptifibatide]     Reportedly had shortness of breath, confusion.  . Tylenol [Acetaminophen]     Tylenol -3 with codiene  . Zithromax [Azithromycin] Nausea And Vomiting  . Glipizide Rash    Headache     Current Outpatient Medications  Medication Sig Dispense Refill  . albuterol (VENTOLIN HFA) 108 (90 Base) MCG/ACT inhaler Inhale 2 puffs into the lungs every 4 (four) hours as needed for wheezing or shortness of breath.    Marland Kitchen amLODipine (NORVASC) 5 MG tablet Take 1 tablet (5 mg total) by mouth daily. 30 tablet 0  . aspirin EC 81 MG EC tablet Take 1 tablet (81 mg total) by mouth daily.    Marland Kitchen atorvastatin (LIPITOR) 40 MG tablet Take 40 mg by mouth daily.    . calcium-vitamin D (OSCAL WITH D) 500-200 MG-UNIT tablet Take 1 tablet by mouth daily. 50 MCG (2000UNIT ) TAB CHOLECALCIFEROL     . carvedilol (COREG) 12.5 MG tablet Take 1 tablet (12.5 mg total) by mouth 2 (two) times daily with a meal. 60 tablet 0  . clopidogrel (PLAVIX) 75 MG tablet Take 75 mg by mouth daily.    . colchicine 0.6 MG tablet Take 0.6 mg by mouth 2 (two) times daily.    . empagliflozin (JARDIANCE) 10 MG TABS tablet Take 1 tablet (10 mg total) by mouth daily. 30 tablet 0  . furosemide (LASIX) 40 MG tablet Take 1 tablet (40 mg total) by mouth daily as needed for fluid or edema (Poor more than 3 pound weight gain overnight or more than 5 pound weight gain over baseline). 10 tablet 0  . insulin glargine (LANTUS SOLOSTAR) 100 UNIT/ML Solostar Pen Inject 50  Units into the skin every evening. 100UNITS/3 ML     . losartan (COZAAR) 50 MG tablet Take 1 tablet (50 mg total) by mouth daily. 30 tablet 0  . MAGNESIUM OXIDE PO Take 500 mg by mouth daily. TAKE 1  TABLET BY MOUTH NIGHTLY FOR LEG CRAMPS     . NITROSTAT 0.4 MG SL tablet Place 0.4 mg under the tongue every 5 (five) minutes as needed for chest pain.     . Omega-3 Fatty Acids (FISH OIL BURP-LESS) 1000 MG CAPS Take 1,000 mg by mouth 2 (two) times daily.    . ondansetron (ZOFRAN) 4 MG tablet Take 1 tablet (4 mg total) by mouth every 8 (eight) hours as needed for nausea or vomiting. 20 tablet 0  . pantoprazole (PROTONIX) 40 MG tablet Take 40 mg by mouth 2 (two) times daily before a meal.     . predniSONE (DELTASONE) 20 MG tablet Take 1 tablet by mouth daily.    . traMADol (ULTRAM) 50 MG tablet Take 50 mg by mouth every 6 (six) hours as needed for pain.     No current facility-administered medications for this visit.     Past Medical History:  Diagnosis Date  . Blepharitis of both eyes    Chronic  . Cataracts,  both eyes   . CKD (chronic kidney disease) stage 3, GFR 30-59 ml/min    Status post right nephrectomy/adrenalectomy and diabetic nephropathy (baseline creatinine 1.4-1.5)  . Coronary artery disease involving native coronary artery 2015   (No cath report prior to 2016 noted, but as of 2016, had stents in LAD, OM and L PDA) as documented since now in proximal LAD, OM 2-2 stents, L PDA at least 3 if not 4 stents, and also now nondominant RCA.  Marland Kitchen COVID-19 virus infection 04/25/2019  . Diabetic peripheral neuropathy associated with type 2 diabetes mellitus ([place])   . Essential hypertension   . GERD without esophagitis   . History of basal cell carcinoma (BCC) excision   . History of complete heart block 06/2016   Status post pacemaker placement  . History of non-ST elevation myocardial infarction (NSTEMI)    And several occasions of unstable angina  . Hyperlipidemia associated with type 2  diabetes mellitus [facility])    Per PCP note in February 2021, was on atorvastatin 40 mg.  . Obesity (BMI 30.0-34.9)   . Occlusion of left vertebral artery    Consider repossible acute lesion in August 2020 with presentation of headache.  CTA suggested occluded left vertebral artery throughout the neck.  Faint string-like enhancement intermittently visible suggesting recent occlusion.  Partial reconstruction and posterior fossa.  Left PICA patent.  (Consider possible left vertebral artery dissection)  . OSA treated with BiPAP 02/2019   Diagnosis in late 2020 ([facility])--> delayed onset of treatment with BiPAP due to Covid hospitalization followed by PE. ->  BiPAP setting at 21/17 cm  . Pulmonary embolism ([place]) 05/18/2019   (1 month following COVID-19 infection): DVT-PE (bilateral PEs noted on VQ scan)-> started on warfarin.  (On warfarin plus Plavix now with aspirin stopped.)  . Type 2 diabetes mellitus with complication, with long-term current use of insulin (HCC)    On Lantus, Jardiance, Metformin & Onglyza    ROS:   All systems reviewed and negative except as noted in the HPI.   Past Surgical History:  Procedure Laterality Date  . BASAL CELL CARCINOMA EXCISION  01/2015  . CAROTID DUPLEX SCAN  12/25/2018   [facility]: Mild plaque in both carotids.  139% bilateral.  Right vertebral flow normal antegrade, Left not seen; normal bilateral subclavian flow..  . CHOLECYSTECTOMY    . CORONARY ARTERY BYPASS GRAFT N/A 08/25/2019   Procedure: CORONARY ARTERY BYPASS GRAFTING (CABG) TIMES FOUR, ON PUMP, USING LEFT AND RIGHT INTERNAL MAMMARY ARTERY AND LEFT RADIAL ARTERY;  Surgeon: [name], MD;  Location: [place];  Service: Open Heart Surgery;  Laterality: N/A;  . CORONARY STENT INTERVENTION  04/10/2015   ([place]; Bishop Limbo, DO) -> DES PCI mLPDA: Xience Alpine DES 2.5 mm x 18 mm, Xience Alpine DES 2.25 mm x 12 mm overlapping.  . CORONARY STENT INTERVENTION   04/04/2016   ([place]; [place], MD)--> (urgent) 100% mLPDA PTCA with 2.25 mm balloon - reduced to 50%; mid OM2 90% - DES PCI (Xience DES  2.5 x 18).   . CORONARY STENT INTERVENTION  2015   Prior to December 2016, STENTS noted in prox LAD, proximal OM1, and at least 2 stents in proximal L PDA  . CORONARY STENT INTERVENTION  11/17/2018   ([place]; Bishop Limbo, DO): CULPRIT: mid 90% (DES PCI) -Resolute Onyx DES 2.5 mm x 30 mm --> COMPLICATION: Large left arm hematoma-evaluated by vascular and orthopedic surgery, managed  with splint and arm elevation.  . CORONARY STENT INTERVENTION  12/28/2018   ([place]; Clarene Critchley, MD, referred by Dr. Mathis Bud) --> INDICATION (urgent, Unstable Angina): Moderate-severe (70%) stenosis of ostial RCA  -> DES PCI Resolute Onyx DES 2.5 mm x 12 mm.   Marland Kitchen LEFT HEART CATH AND CORONARY ANGIOGRAPHY  04/10/2015   ([place]; Bishop Limbo, DO)--> EF 55%.  Mild inferior HK.  Coronaries-LM: Normal, p LAD STENT 30% ISR, mLAD 20% & diffuse dLAD ~20%; Dom LCx: mCx 20%, ~pOM1 STENT (noted as OM2 in other reports) patent with mid 30%, OM2 normal, prox LPDA "STENTS" patent with SEVERE mid L PDA 90-95% (DES PCI x 2 overlapping); Small-non-dominant RCA  patent   . LEFT HEART CATH AND CORONARY ANGIOGRAPHY  04/04/2016   ([place]; [place], MD)--> (urgent): EF 70%.  Previous LAD,~OM2 and proximal PDA stents patent; -> mLAD 35%, dLAD 40%; LCx-OM1 75% (short lesion, med Rx), mid OM2 90% (DES PCI), pLPDA stents patent w/ mPDA 100% (PTCA only - reduced to 50%); non-dom RCA - ost RCA 60% & mRCA 75%  . LEFT HEART CATH AND CORONARY ANGIOGRAPHY  11/17/2018   ([place]; Bishop Limbo, DO) indication: Angina.  LM normal; LAD - ost LAD ~50%, pLAD STENT ~30% ISR with dLAD ~40%; Dom LCx - ost & prox Cx 30%, OM1 STENT 20% ISR, OM2 STENT 50% distal edge, pLPDA overlapping STENTS ~20%; nonDom RCA - proxRCA 40%,  mid 90% (DES PCI)  . LEFT HEART CATH AND CORONARY ANGIOGRAPHY  12/28/2018   ([place]; Clarene Critchley, MD, referred by Dr. Mathis Bud) --> INDICATION (urgent, Unstable Angina): Moderate-severe (70%) stenosis of ostial RCA (DES PCI), mRCA 30%.  Otherwise no significant change from July 2020: dLM 20%, pLAD ~40% ISR , dLAD long/diffuse ~50%; OstLCx 30%, OM1 ~15% OM2 ~30% with patent  LPDA stents/PTCA site.   Marland Kitchen LEFT HEART CATH AND CORONARY ANGIOGRAPHY N/A 08/20/2019   Procedure: LEFT HEART CATH AND CORONARY ANGIOGRAPHY;  Surgeon: Leonie Man, MD;  Location: [place] CV [facility];  Service: Cardiovascular;  Laterality: N/A;  . NEPHRECTOMY Right 2012   With adrenalectomy  . NM MYOVIEW LTD  08/20/2018   [facility] -> Lexiscan Myoview: EF 81%.  No reversible ischemia or infarction.  Normal wall motion.  Marland Kitchen PACEMAKER IMPLANT  06/2016   [facility] [place] ([employer]) -> according to CT of chest, leads positioned in right atrium, cardiac apex and coronary sinus (suggesting CRT-P - BiV Pacing))  . RADIAL ARTERY HARVEST Left 08/25/2019   Procedure: RADIAL ARTERY HARVEST;  Surgeon: [name], MD;  Location: [place];  Service: Open Heart Surgery;  Laterality: Left;  . TEE WITHOUT CARDIOVERSION N/A 08/25/2019   Procedure: TRANSESOPHAGEAL ECHOCARDIOGRAM (TEE);  Surgeon: [name], MD;  Location: [place];  Service: Open Heart Surgery;  Laterality: N/A;  . TRANSTHORACIC ECHOCARDIOGRAM  11/2018; 05/01/2019   ([place]) a) EF 60-65%.  Mild TR.;; b)moderate concentric LVH.  EF 55 to 60%.  No R WMA.  Normal RV size and function.  Normal atrial sizes.  Normal valves.     Family History  Problem Relation Age of Onset  . Stroke Mother   . Hypertension Mother   . Hyperlipidemia Mother   . Hypertension Father   . Hyperlipidemia Father   . Coronary artery disease Father      Social History   Socioeconomic History  . Marital status: Married  Spouse name: Not on file  . Number of children: Not on file  . Years of education: Not on file  . Highest education level: Not on file  Occupational History  . Not on file  Tobacco Use  . Smoking status: Never Smoker  . Smokeless tobacco: Never Used  Vaping Use  . Vaping Use: Never used  Substance and Sexual Activity  . Alcohol use: No  . Drug use: No  . Sexual activity: Not on file  Other Topics Concern  . Not on file  Social History Narrative  . Not on file   Social Determinants of Health   Financial Resource Strain:   . Difficulty of Paying Living Expenses: Not on file  Food Insecurity:   . Worried About Charity fundraiser in the Last Year: Not on file  . Ran Out of Food in the Last Year: Not on file  Transportation Needs:   . Lack of Transportation (Medical): Not on file  . Lack of Transportation (Non-Medical): Not on file  Physical Activity:   . Days of Exercise per Week: Not on file  . Minutes of Exercise per Session: Not on file  Stress:   . Feeling of Stress : Not on file  Social Connections:   . Frequency of Communication with Friends and Family: Not on file  . Frequency of Social Gatherings with Friends and Family: Not on file  . Attends Religious Services: Not on file  . Active Member of Clubs or Organizations: Not on file  . Attends Archivist Meetings: Not on file  . Marital Status: Not on file  Intimate Partner Violence:   . Fear of Current or Ex-Partner: Not on file  . Emotionally Abused: Not on file  . Physically Abused: Not on file  . Sexually Abused: Not on file     BP 106/70   Pulse 89   Ht 5\' 8"  (1.727 m)   Wt 194 lb (88 kg)   SpO2 94%   BMI 29.50 kg/m   Physical Exam:  Well appearing middle aged man, NAD HEENT: Unremarkable Neck:  6 cm JVD, no thyromegally Lymphatics:  No adenopathy Back:  No CVA tenderness Lungs:  Clear with no wheezes HEART:  Regular rate rhythm, no murmurs, no rubs, no clicks Abd:  soft, positive  bowel sounds, no organomegally, no rebound, no guarding Ext:  2 plus pulses, no edema, no cyanosis, no clubbing Skin:  No rashes no nodules Neuro:  CN II through XII intact, motor grossly intact  DEVICE  Normal device function.  See PaceArt for details.   Assess/Plan: 1. CHB - he has no escape. He is asymptomatic, s/p PPM insertion.  2. PPM - his medtronic Biv PPM is working normally. 3.CAD - he denies anginal symptoms. We will follow.  4. HTN - his bp is well controlled. No change in meds.  John Dodson.

## 2019-12-20 ENCOUNTER — Telehealth: Payer: Self-pay

## 2019-12-20 NOTE — Telephone Encounter (Signed)
-----   Message from Damian Leavell, RN sent at 12/17/2019 11:36 AM EDT ----- Regarding: set up with remote checks please

## 2019-12-20 NOTE — Telephone Encounter (Signed)
Left message for [facility] cardiology device clinic requesting release of patient in [place].

## 2019-12-21 ENCOUNTER — Other Ambulatory Visit: Payer: Self-pay

## 2019-12-21 ENCOUNTER — Ambulatory Visit: Payer: Medicare Other | Admitting: Cardiology

## 2019-12-21 ENCOUNTER — Encounter: Payer: Self-pay | Admitting: Cardiology

## 2019-12-21 VITALS — BP 102/64 | HR 86 | Ht 68.0 in | Wt 199.0 lb

## 2019-12-21 DIAGNOSIS — I2699 Other pulmonary embolism without acute cor pulmonale: Secondary | ICD-10-CM | POA: Diagnosis not present

## 2019-12-21 DIAGNOSIS — I10 Essential (primary) hypertension: Secondary | ICD-10-CM | POA: Diagnosis not present

## 2019-12-21 DIAGNOSIS — Z950 Presence of cardiac pacemaker: Secondary | ICD-10-CM

## 2019-12-21 DIAGNOSIS — Z951 Presence of aortocoronary bypass graft: Secondary | ICD-10-CM

## 2019-12-21 DIAGNOSIS — Z8679 Personal history of other diseases of the circulatory system: Secondary | ICD-10-CM

## 2019-12-21 DIAGNOSIS — I25119 Atherosclerotic heart disease of native coronary artery with unspecified angina pectoris: Secondary | ICD-10-CM

## 2019-12-21 DIAGNOSIS — E785 Hyperlipidemia, unspecified: Secondary | ICD-10-CM

## 2019-12-21 DIAGNOSIS — G4733 Obstructive sleep apnea (adult) (pediatric): Secondary | ICD-10-CM

## 2019-12-21 DIAGNOSIS — I200 Unstable angina: Secondary | ICD-10-CM

## 2019-12-21 DIAGNOSIS — I2510 Atherosclerotic heart disease of native coronary artery without angina pectoris: Secondary | ICD-10-CM

## 2019-12-21 DIAGNOSIS — Z9861 Coronary angioplasty status: Secondary | ICD-10-CM

## 2019-12-21 NOTE — Telephone Encounter (Addendum)
Error

## 2019-12-21 NOTE — Patient Instructions (Addendum)
Medication Instructions:   PLEASE START TAKING LOSARTAN 50 MG  AND CARVEDILOL 12.5 MG  IN THE MORNING ANDTAKE AMLODIPINE  5 MG AND  CARVEDILOL 12.5 MG  IN THE EVENING     DEPENDING,  IF YOUR BLOOD PRESSURE IS BELOW 185 SYSTOLIC  HOLD TAKING EITHER YOU LOSARTAN  50 MG  OR AMLODIPINE 5 MG   BUT  ALWAYS TAKE CARVEDILOL 12.5 MG  *If you need a refill on your cardiac medications before your next appointment, please call your pharmacy   Other Instructions   IF BLOOD PRESSURE SYSTOLIC NUMBER IS [phone] OR ABOVE -TAKE FUROSEMIDE 40 MG  AND NITROGLYCERIN 0.4 MG TABLET SUBLINGUAL     Lab Work: NOT NEEDED   Testing/Procedures: NOT NEEDED   Follow-Up: At [facility], you and your health needs are our priority.  As part of our continuing mission to provide you with exceptional heart care, we have created designated Provider Care Teams.  These Care Teams include your primary Cardiologist (physician) and Advanced Practice Providers (APPs -  Physician Assistants and Nurse Practitioners) who all work together to provide you with the care you need, when you need it.      Your next appointment:   4 TO 5 month(s) DEC 2021 OR [id number]  The format for your next appointment:   In Person  Provider:   Glenetta Hew, MD

## 2019-12-21 NOTE — Progress Notes (Signed)
Primary Care Provider: Kristopher Glee., MD Cardiologist: Glenetta Hew, MD Electrophysiologist: None  Clinic Note: Chief Complaint  Patient presents with  . Hospitalization Follow-up    Admitted for hypertensive urgency and diastolic heart failure  . Coronary Artery Disease    Second post hospital follow-up after CABG   HPI:    John Dodson is a 59 y.o. 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   John Dodson has an extensive cardiac history with at least 12 stents placed-noted in LAD, OM1, OM 2, at least 4 in L PDA and 2 in nondominant RCA.  He also has history of second-degree Mobitz 2-third-degree AV block status post biventricular cardiac pacemaker while in [place], [place].  He has been followed by Dr. Beatrix Fetters and Dr. Minna Merritts.    He also was recently diagnosed with severe OSA (referred after September 2020 visit), and has been started on BiPAP with recent adjustments most recently on June 30, 2019.  Tested positive for COVID-19 in December 2020, then was hospitalized for acute hypoxia respite failure and found to have PE in January 2021  He has known CKD hypokalemia.  He requested transfer to cardiology care to [facility] after being seen by his primary cardiologist on June 21, 2019 for evaluation of chest pain described as retrosternal radiating to the jaw.  He been noting it off and on for about a month. ->  He has had multiple different Procedures last year.  He felt that he was not being paid enough attention & therefore transfer care.  I saw him for initial consult on March 18 as a second opinion.  He again noted off-and-on episodes of significant chest pain radiating to the jaw ever since he had his PE in January.  Never really  got over the COVID-19 infection.  The chest pain was happening both with minimal exertion and also at rest.  (Clearly some very difficult other atypical features of chest pain)-->  Initial plan was to titrate medical management by increasing Toprol dose and adding Imdur with close follow-up on April 15 via telemedicine.  Despite addition of new medications, his symptoms persisted number getting perhaps worse.  But had to stop walking up a flight of steps because of chest pain.  Often taking 2 nitroglycerin at a time. --> Based on somewhat concerning symptoms, I decided to proceed with cardiac catheterization.  We did titrate up Ranexa as a precaution.  Recent Hospitalizations/ Clinic Visits:   August 20, 2019: Underwent cardiac catheterization--CVTS consultation --> CABG on 08/25/2019  John Dodson was last seen on 09/20/2019 by Jory Sims, NP -> he had recovered well postop.  Was on a little Lasix for postop volume overload.  Was restarted on Cozaar (but not taking) and Coumadin.  Was feeling relatively well but had some confusion about medications.  Was sore but not on expected.  Was back on CPAP.  Increased rate control by titrating metoprolol to 50 mg twice daily.  Converted from Isordil to Imdur 15 mg. He saw Dr. Julien Girt again on September 30, 2019 for postop follow-up, to.  Noted increased exercise stamina capacity.  No chest pain or pressure. ->  Was okay to liberalize activity.  As needed follow-up.   11/08/2019-ER visit for gout  11/19/2019: ER visit  for leg swelling, headache and nausea with elevated blood pressure.  This occurred in cardiac rehab.  Noted 9 pound weight gain over 9 days with some swelling. ->  Given IV Lasix and started on oral Lasix.  7/27-29/2021: Admitted with hypertensive urgency, BP 212/109--was not taking losartan and isosorbide.  Cardiology consulted, titrated Coreg to 12.5 mg BID along with losartan 50 mg daily, amlodipine 5 mg daily and PRN  Lasix for 3+ lb  weight gain overnight or 5 lb above baseline in 1 week.    Imdur discontinued because of headache.  Also started on Jardiance  He was most recently seen by Dr. Lovena Le and lately on December 17, 2019 to follow-up his BiV PPM-and as his first evaluation post hospitalization.  Noted to have no escape rhythm ->  BP well controlled.  No edema.  No chest pain or dyspnea.  Reviewed  CV studies:    The following studies were reviewed today: (if available, images/films reviewed: From Epic Chart or Care Everywhere) . Cardiac Cath August 20, 2019: Multivessel CAD: Patent stents in proximal to mid LAD, proximal OM1, proximal LPL 1 and several stents in L PDA as well as RCA (nondominant).  Ostial LCx 60%-highly DFR +0.84.  There is a long mid LAD 60% stenosis after the stent--highly DFR +0.67.  o Referred for CABG o TTE August 21, 2019: EF 55-60%.  Normal wall motion.  GR 1 DD.  Moderate concentric LVH.  Borderline elevated PA pressure.  Mild to moderate TR.  Otherwise normal. o CABG x4 (Dr. Julien Girt) 08/25/2019: LIMA-LAD, sequential L Rad-OM2-L PDA, RIMA-??  Ramus (no ramus on preop cath, likely OM1) . Intra-Op TEE 08/25/2019: Preop-EF 55 to 60%.  Mild concentric LVH.  RAP 10 mmHg.  Normal valves.  Postop no changes. . Limited TTE 08/27/2019: No significant effusion.  Likely prominent epicardial fat pad EF normal   Interval History:   John Dodson returns today for his first follow-up visit with me since his admission for cath-CABG.  He says that since the last brief hospital stay with diuresis he has been doing pretty well.  His pressures have been doing better.  He is only using 1 or 2 doses of as needed Lasix says he is finally starting to feel better.  He is finally starting to feel little bit more energy level.  The edema seems to be pretty well-controlled.  He is not having any more than nausea. His weights have been pretty much stable with the highest weight being 198 and lowest being mostly 193.   Averaging out about 194 to 195 pounds.    He is not really having that much of any PND or orthopnea.   His cardiac rehab reports are also provided where there are occasional times that his blood pressures probably little bit higher than what he gets at home.  The recording pressures in the 130-150 mmHg range systolic.   The one episode where his blood pressure was 199/101 on the 27th is what led to him being hospitalized.  He did not exercise.  He did have some central chest discomfort.  He has been monitoring his blood pressure very closely at home since the last discharge.  He shows me blood pressure recordings at home with a.m. readings ranging mostly in the 110-120/60-70 mmHg range with a few low outliers of 85/60 and 99/60 mmHg with high outliers of 132/73 mmHg.    They tend to be a little bit higher in the p.m. with some readings  as high as 144/75.  Most average they are in the 120 -130/60-80 mmHg range.  There were a total of 7 times when he took an additional dose of Lasix and this.  From 30 July till now.  He says he is very pleased with how well he is doing.  He only noted some chest discomfort when his blood pressure was very high and he needed to take Lasix but otherwise has not had any of the chest discomfort that he had leading up to his CABG.  He has musculoskeletal symptoms but even that is improved.  He is very happy with the care that has been given and happy with how he feels now.    Cardiovascular Review of Symptoms (Summary): positive for - His only noted chest pain when his blood pressures were very high but otherwise no resting exertional chest pain.  Minimal exertional dyspnea, more limited by arthritis pains.  Well-controlled edema with minimal use of as needed Lasix. negative for - dyspnea on exertion, irregular heartbeat, orthopnea, palpitations, paroxysmal nocturnal dyspnea, rapid heart rate, shortness of breath or Syncope/near syncope, TIA/amaurosis fugax,  claudication.  The patient does not have symptoms concerning for COVID-19 infection (fever, chills, cough, or new shortness of breath).  The patient is practicing social distancing & Masking.    REVIEWED OF SYSTEMS   ROS   I have reviewed and (if needed) personally updated the patient's problem list, medications, allergies, past medical and surgical history, social and family history.   PAST MEDICAL HISTORY   Past Medical History:  Diagnosis Date  . Blepharitis of both eyes    Chronic  . Cataracts, both eyes   . CKD (chronic kidney disease) stage 3, GFR 30-59 ml/min    s/p R Nephrectomy/adrenalectomy & DM-Nephropathy (baseline creatinine 1.4-1.5)  . COVID-19 virus infection 04/25/2019  . Diabetic peripheral neuropathy associated with type 2 diabetes mellitus ([place])   . Essential hypertension   . GERD without esophagitis   . History of basal cell carcinoma (BCC) excision   . History of complete heart block 06/2016   s/p BiV PPM (now followed by Dr. Merlyn Lot - EP)  . History of non-ST elevation myocardial infarction (NSTEMI)    And several occasions of unstable angina  . Hx of CABGx4 08/25/2019   (Dr. Briant Cedar, [facility]): LIMA-LAD, Seq Rad- OM2-LPDA, RIMA-? OM1 (called Ramus on report, but no ramus seen on cath).  . Hyperlipidemia associated with type 2 diabetes mellitus ([place])    on atorvastatin 40 mg.  . Multiple Vessel CAD -- s/p PCI of LAD, OM1, LPL1, LPDA & non-dominant RCA --> now S/P CABG x 4 60%   (No cath report prior to 2016) (stents in proxLAD, prox OM1 x 2, prox LPL1 & 3-4 in L PDA & ~2 in  Known non-dominant RCA;  07/2019: MV CAD - patent stents in p-m LAD, p OM1, p LPL & LPDA as well as RCA -> Ost LCx ~60-70% (DFR ++), long mLAD 60% post-stent (DFR ++) --> referred for CABG  . Obesity (BMI 30.0-34.9)   . Occlusion of left vertebral artery    Consider repossible acute lesion in August 2020 with presentation of headache.  CTA suggested occluded left vertebral artery  throughout the neck.  Faint string-like enhancement intermittently visible suggesting recent occlusion.  Partial reconstruction and posterior fossa.  Left PICA patent.  (Consider possible left vertebral artery dissection)  . OSA treated with BiPAP 02/2019   Diagnosis in late 2020 ([facility])--> delayed onset  of treatment with BiPAP due to Covid hospitalization followed by PE. ->  BiPAP setting at 21/17 cm  . Pulmonary embolism ([place]) 05/18/2019   (1 month following COVID-19 infection): DVT-PE (bilateral PEs noted on VQ scan)-> started on warfarin.  (On warfarin plus Plavix now with aspirin stopped.)  . Type 2 diabetes mellitus with complication, with long-term current use of insulin (HCC)    On Lantus, Jardiance, Metformin & Onglyza   Hospitalizations over the Last Year leading up to his CABG admission  July 2020: Admitted for cardiac cath for angina-PCI of mid nondominant RCA ? Clinic note November 02, 2018: Noted several months of worsening chest squeezing and shortness of breath radiating to his neck and right arm.  Reproducible walking up flight of steps.  Also noted exertional dyspnea.  August 2020: Admitted with headache and chest pain.   At this time, he noted some chest pain radiating to the jaw with minimal troponin elevation.  Found to have occluded left vertebral artery,   also in cath found to have ostial nondominant RCA treated with another stent.  (Reportedly his 12th stent overall). -->  Patient indicated that this was likely a secondary event.  ER visit November 2020--chest pain thought felt to be pleuritic in nature.  Was going on all day long.  April 25, 2019: COVID-19 infection  January 17-20, 2021: Admitted for acute hypoxic respite failure (presumed to be bacterial pneumonia-failed outpatient doxycycline) along with DKA.  Found to have BILATERAL PE.  PAST SURGICAL HISTORY   Past Surgical History:  Procedure Laterality Date  . BASAL CELL CARCINOMA EXCISION   01/2015  . CAROTID DUPLEX SCAN  12/25/2018   [facility]: Mild plaque in both carotids.  139% bilateral.  Right vertebral flow normal antegrade, Left not seen; normal bilateral subclavian flow..  . CHOLECYSTECTOMY    . CORONARY ARTERY BYPASS GRAFT N/A 08/25/2019   Procedure: CORONARY ARTERY BYPASS GRAFTING (CABG) TIMES FOUR, ON PUMP, USING LEFT AND RIGHT INTERNAL MAMMARY ARTERY AND LEFT RADIAL ARTERY;  Surgeon: [name], MD;  Location: [facility] OR;  Service: Open Heart Surgery;; LIMA-LAD, Seq Rad OM2-LPDA, RIMA-OM1  . CORONARY STENT INTERVENTION  04/10/2015   ([place]; Bishop Limbo, DO) -> DES PCI mLPDA: Xience Alpine DES 2.5 mm x 18 mm, Xience Alpine DES 2.25 mm x 12 mm overlapping.  . CORONARY STENT INTERVENTION  04/04/2016   ([place]; [place], MD)--> (urgent) 100% mLPDA PTCA with 2.25 mm balloon - reduced to 50%; mid OM2 90% - DES PCI (Xience DES  2.5 x 18).   . CORONARY STENT INTERVENTION  2015   Prior to December 2016, STENTS noted in prox LAD, proximal OM1, and at least 2 stents in proximal L PDA  . CORONARY STENT INTERVENTION  11/17/2018   ([place]; Bishop Limbo, DO): CULPRIT: mid RCA 90% (DES PCI) -Resolute Onyx DES 2.5 mm x 30 mm --> COMPLICATION: Large left arm hematoma-evaluated by vascular and orthopedic surgery, managed with splint and arm elevation.  . CORONARY STENT INTERVENTION  12/28/2018   ([place]; Clarene Critchley, MD, referred by Dr. Mathis Bud) --> INDICATION (urgent, Unstable Angina): Moderate-severe (70%) stenosis of ostial RCA  -> DES PCI Resolute Onyx DES 2.5 mm x 12 mm.   Marland Kitchen LEFT HEART CATH AND CORONARY ANGIOGRAPHY  04/10/2015   ([place]; Bishop Limbo, DO)--> EF 55%.  Mild inferior HK.  Coronaries-LM: Normal, p LAD STENT 30% ISR, mLAD 20% & diffuse dLAD ~20%; Dom LCx:  mCx 20%, ~pOM1 STENT (noted as OM2 in other reports) patent with mid 30%, OM2 normal, prox LPDA "STENTS" patent with  SEVERE mid L PDA 90-95% (DES PCI x 2 overlapping); Small-non-dominant RCA  patent   . LEFT HEART CATH AND CORONARY ANGIOGRAPHY  04/04/2016   ([place]; [place], MD)--> (urgent): EF 70%.  Previous LAD,~OM2 and proximal PDA stents patent; -> mLAD 35%, dLAD 40%; LCx-OM1 75% (short lesion, med Rx), mid OM2 90% (DES PCI), pLPDA stents patent w/ mPDA 100% (PTCA only - reduced to 50%); non-dom RCA - ost RCA 60% & mRCA 75%  . LEFT HEART CATH AND CORONARY ANGIOGRAPHY  11/17/2018   ([place]; Bishop Limbo, DO) indication: Angina.  LM normal; LAD - ost LAD ~50%, pLAD STENT ~30% ISR with dLAD ~40%; Dom LCx - ost & prox Cx 30%, OM1 STENT 20% ISR, OM2 STENT 50% distal edge, pLPDA overlapping STENTS ~20%; nonDom RCA - proxRCA 40%, mid 90% (DES PCI)  . LEFT HEART CATH AND CORONARY ANGIOGRAPHY  12/28/2018   ([place]; Clarene Critchley, MD, referred by Dr. Mathis Bud) --> INDICATION (urgent, Unstable Angina): Moderate-severe (70%) stenosis of ostial RCA (DES PCI), mRCA 30%.  Otherwise no significant change from July 2020: dLM 20%, pLAD ~40% ISR , dLAD long/diffuse ~50%; OstLCx 30%, OM1 ~15% OM2 ~30% with patent  LPDA stents/PTCA site.   Marland Kitchen LEFT HEART CATH AND CORONARY ANGIOGRAPHY N/A 08/20/2019   Procedure: LEFT HEART CATH AND CORONARY ANGIOGRAPHY;  Surgeon: Leonie Man, MD;  Location: [place] [facility];  Service: Cardiovascular;; Multivessel CAD: Patent stents in proximal to mid LAD, proximal OM1, proximal LPL 1 and several stents in L PDA as well as RCA (nondominant).  Ostial LCx 60%-highly DFR +0.84.  There is a long mid LAD 60% stenosis after the stent--highly DFR +0.67.--> CABG  . NEPHRECTOMY Right 2012   With adrenalectomy  . NM MYOVIEW LTD  08/20/2018   [facility] -> Lexiscan Myoview: EF 81%.  No reversible ischemia or infarction.  Normal wall motion.  Marland Kitchen PACEMAKER IMPLANT  06/2016   [facility] [place] ([employer]) -> according to CT of  chest, leads positioned in right atrium, cardiac apex and coronary sinus (suggesting CRT-P - BiV Pacing))  . RADIAL ARTERY HARVEST Left 08/25/2019   Procedure: RADIAL ARTERY HARVEST;  Surgeon: [name], MD;  Location: [place];  Service: Open Heart Surgery;  Laterality: Left;  . TEE WITHOUT CARDIOVERSION N/A 08/25/2019   Procedure: TRANSESOPHAGEAL ECHOCARDIOGRAM (TEE);  Surgeon: [name], MD;  Location: [place];  Service: Open Heart Surgery;  Laterality: N/A;  . TRANSTHORACIC ECHOCARDIOGRAM  11/2018; 05/01/2019   ([place]) a) EF 60-65%.  Mild TR.;; b)moderate concentric LVH.  EF 55 to 60%.  No R WMA.  Normal RV size and function.  Normal atrial sizes.  Normal valves.    MEDICATIONS/ALLERGIES   Current Meds  Medication Sig  . albuterol (VENTOLIN HFA) 108 (90 Base) MCG/ACT inhaler Inhale 2 puffs into the lungs every 4 (four) hours as needed for wheezing or shortness of breath.  Marland Kitchen amLODipine (NORVASC) 5 MG tablet Take 1 tablet (5 mg total) by mouth daily.  Marland Kitchen aspirin EC 81 MG EC tablet Take 1 tablet (81 mg total) by mouth daily.  Marland Kitchen atorvastatin (LIPITOR) 40 MG tablet Take 1 tablet (40 mg total) by mouth daily.  . calcium-vitamin D (OSCAL WITH D) 500-200 MG-UNIT tablet Take 1 tablet by mouth daily. 50 MCG (2000UNIT )  TAB CHOLECALCIFEROL   . carvedilol (COREG) 12.5 MG tablet Take 1 tablet (12.5 mg total) by mouth 2 (two) times daily with a meal.  . clopidogrel (PLAVIX) 75 MG tablet Take 1 tablet (75 mg total) by mouth daily.  . colchicine 0.6 MG tablet Take 0.6 mg by mouth 2 (two) times daily.  . empagliflozin (JARDIANCE) 10 MG TABS tablet Take 1 tablet (10 mg total) by mouth daily.  . furosemide (LASIX) 40 MG tablet Take 1 tablet (40 mg total) by mouth daily as needed for fluid or edema (Poor more than 3 pound weight gain overnight or more than 5 pound weight gain over baseline).  . insulin glargine (LANTUS SOLOSTAR) 100 UNIT/ML Solostar Pen Inject 50 Units into the skin  every evening. 100UNITS/3 ML   . losartan (COZAAR) 50 MG tablet Take 1 tablet (50 mg total) by mouth daily.  Marland Kitchen MAGNESIUM OXIDE PO Take 500 mg by mouth daily. TAKE 1  TABLET BY MOUTH NIGHTLY FOR LEG CRAMPS   . nitroGLYCERIN (NITROSTAT) 0.4 MG SL tablet Place 1 tablet (0.4 mg total) under the tongue every 5 (five) minutes as needed for chest pain.  . Omega-3 Fatty Acids (FISH OIL BURP-LESS) 1000 MG CAPS Take 1,000 mg by mouth 2 (two) times daily.  . ondansetron (ZOFRAN) 4 MG tablet Take 1 tablet (4 mg total) by mouth every 8 (eight) hours as needed for nausea or vomiting.  . pantoprazole (PROTONIX) 40 MG tablet Take 40 mg by mouth 2 (two) times daily before a meal.   . predniSONE (DELTASONE) 20 MG tablet Take 1 tablet by mouth daily.  . traMADol (ULTRAM) 50 MG tablet Take 50 mg by mouth every 6 (six) hours as needed for pain.    Allergies  Allergen Reactions  . Latex Rash  . Codeine Nausea And Vomiting  . Contrast Media [Iodinated Diagnostic Agents]     Reportedly cardiac arrest  . Integrilin [Eptifibatide]     Reportedly had shortness of breath, confusion.  . Tylenol [Acetaminophen]     Tylenol -3 with codiene  . Zithromax [Azithromycin] Nausea And Vomiting  . Glipizide Rash    Headache    SOCIAL HISTORY/FAMILY HISTORY   Reviewed in Epic:  Pertinent findings: N/A  OBJCTIVE -PE, EKG, labs   Wt Readings from Last 3 Encounters:  12/21/19 199 lb (90.3 kg)  12/17/19 194 lb (88 kg)  11/25/19 194 lb 4.8 oz (88.1 kg)    Physical Exam: BP 102/64   Pulse 86   Ht 5\' 8"  (1.727 m)   Wt 199 lb (90.3 kg)   SpO2 96%   BMI 30.26 kg/m  Physical Exam Constitutional:      General: He is not in acute distress.    Appearance: Normal appearance. He is obese. He is not ill-appearing.     Comments: Healthy-appearing.  Well-groomed.  HENT:     Head: Normocephalic and atraumatic.  Neck:     Vascular: No carotid bruit.     Comments: No JVD or HJR. Cardiovascular:     Rate and Rhythm:  Normal rate and regular rhythm.     Pulses: Normal pulses.     Heart sounds: Normal heart sounds. No murmur heard.  No friction rub. No gallop.   Pulmonary:     Effort: Pulmonary effort is normal. No respiratory distress.     Breath sounds: Normal breath sounds.  Chest:     Chest wall: No tenderness (Maybe a little bit tender along the sternal borders at the  sites of his sternal wires, but otherwise stable).  Abdominal:     General: Abdomen is flat. Bowel sounds are normal. There is no distension.     Palpations: Abdomen is soft. There is no mass (No HSM).  Musculoskeletal:        General: Swelling (Very trivial) present. Normal range of motion.     Cervical back: Normal range of motion.  Neurological:     General: No focal deficit present.     Mental Status: He is alert and oriented to person, place, and time.  Psychiatric:        Mood and Affect: Mood normal.        Behavior: Behavior normal.        Thought Content: Thought content normal.        Judgment: Judgment normal.     Adult ECG Report Not checked  Recent Labs: Has not had labs checked since hospital stay.  Is due for follow-up lipids. Lab Results  Component Value Date   CHOL 122 08/24/2019   HDL 29 (L) 08/24/2019   LDLCALC 64 08/24/2019   TRIG 78 08/25/2019   CHOLHDL 4.2 08/24/2019   Lab Results  Component Value Date   CREATININE 1.55 (H) 11/25/2019   BUN 19 11/25/2019   NA 141 11/25/2019   K 4.3 11/25/2019   CL 103 11/25/2019   CO2 27 11/25/2019   No results found for: TSH  ASSESSMENT/PLAN    Problem List Items Addressed This Visit    Coronary artery disease involving native coronary artery of native heart with angina pectoris ([place]) - Primary (Chronic)    When I last saw him, he was having progressive anginal symptoms and we proceeded with cardiac catheterization revealing multivessel disease that may have been overlooked at the time of his previous PCI.  He had a significant ostial circumflex lesion  along with hemodynamically significant LAD lesion.  He was referred for CABG and now is no longer having anginal symptoms. 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  Since his last discharge no more angina.  Plan:   We will continue aspirin and Plavix given his multiple stents ->   okay to hold both aspirin and Plavix 5 to 7 days preop for any procedures or surgeries.  We have placed him on Ranexa, this was stopped after CABG.  Also no longer on Imdur because of headache. ->  Low threshold to consider reinstituting Ranexa  Continue empagliflozin along with furosemide standing dose)  Continue current dose of rosuvastatin  He is on stable dose of amlodipine, carvedilol and losartan, but his evening pressures are running a little higher than morning pressures with morning pressures being somewhat low.  Switch dosing intervals: take amlodipine along with the p.m. dose of carvedilol, and lisinopril with the a.m. dose.      Pulmonary embolism on long-term anticoagulation therapy (HCC) (Chronic)    He is now over 6 months out from his pulmonary embolus, and his PCP is stopped warfarin.  As such, he is back on  aspirin and Plavix.      Hyperlipidemia with target LDL less than 70 (Chronic)    On atorvastatin 40 mg daily.  Has not had follow-up labs since his CABG.  Labs look pretty well controlled at that time.  We will recheck when I see him back in follow-up if they have not already been checked by PCP.      Essential hypertension (Chronic)    His blood pressures tend to  go up and down.  When he does have hypertension, he feels much worse.  I think a lot of it was volume related.  The addition of Lasix has definitely made an improvement in his symptoms. His blood pressure recordings show that morning pressures may be a little bit low or his the higher pressures are in the evening.  Plan: Continue current meds but with adjustments as follows:  Continue carvedilol 12.5 mg twice daily  Take amlodipine in the p.m. dose of carvedilol, and lisinopril with a.m. dose  Continue standing dose of furosemide 40 mg daily with additional doses as needed based on weight related sliding scale.      CAD S/P PCI (Chronic)    He has multiple stents in the LAD as well as circumflex with now grafted downstream vessels, but there is significant upstream stents.  He also has at least 2 if not 3 stents in the nondominant RCA.  Since he is currently off of warfarin, we will put him back on aspirin and Plavix for now, but could likely stop aspirin and simply continue lifelong Plavix.   Okay to hold aspirin/Plavix 5 to 7 days preop for any procedures or surgeries.      OSA treated with BiPAP (Chronic)    He was initially not doing well with his BiPAP machine.  But he is now back to using his BiPAP doing well.      History of complete heart block (Chronic)    Status post BiV PPM- Stable.  Not followed by Dr. Lovena Le      S/P CABG x 4 (Chronic)    Released by Dr. Julien Girt.  There was initial concern about possible pericardial effusion postop, however the echo did not show any evidence of effusion.  At this point, he is doing much better with revascularization.  Any chest pain that he had prior to his most recent hospitalization was more likely related to diastolic dysfunction or microvascular disease.  Continue to monitor for signs of ischemia, but hopefully we can hold off on screening evaluation with stress test for couple years.      Biventricular cardiac pacemaker in situ  (Chronic)    Now being monitored by Dr. Lovena Le.  Appears to be working well.  He is pretty much now pacemaker dependent based on most recent evaluation.      Progressive angina (HCC)    No longer having the significant anginal symptoms he was having.  We had increased his dose of Toprol which was then converted to carvedilol for better blood pressure control.  He was on Ranexa and Imdur.  Imdur stopped because of headache.  Now post CABG no longer on Ranexa.          COVID-19 Education: The signs and symptoms of COVID-19 were discussed with the patient and how to seek care for testing (follow up with PCP or arrange E-visit).   The importance of social distancing and COVID-19 vaccination was discussed today.  I spent a total of 41 minutes with the patient. >  50% of the time was spent in direct patient consultation.  Additional time spent with chart review  / charting (studies, outside notes, etc): 40+ --> Since last visit, the patient has had cardiac catheterization, CABG, multiple echocardiograms, 2 additional hospitalizations and multiple clinic visits all reviewed.  I also reviewed forwarded notes from his cardiac rehab. Total Time: 27min  Current medicines are reviewed at length with the patient today.  (+/- concerns) N/A  Notice: This dictation was prepared with Dragon dictation along with smaller phrase technology. Any transcriptional errors that result from this process are unintentional and may not be corrected upon review.  Patient Instructions / Medication Changes & Studies & Tests Ordered   Patient Instructions  Medication Instructions:   PLEASE START TAKING LOSARTAN 50 MG  AND CARVEDILOL 12.5 MG  IN THE MORNING ANDTAKE AMLODIPINE  5 MG AND  CARVEDILOL 12.5 MG  IN THE EVENING     DEPENDING,  IF YOUR BLOOD PRESSURE IS BELOW 709 SYSTOLIC  HOLD TAKING EITHER YOU LOSARTAN  50 MG  OR AMLODIPINE 5 MG   BUT  ALWAYS TAKE CARVEDILOL 12.5 MG  *If you need a refill on your cardiac  medications before your next appointment, please call your pharmacy   Other Instructions   IF BLOOD PRESSURE SYSTOLIC NUMBER IS 628 OR ABOVE -TAKE FUROSEMIDE 40 MG  AND NITROGLYCERIN 0.4 MG TABLET SUBLINGUAL     Lab Work: NOT NEEDED   Testing/Procedures: NOT NEEDED   Follow-Up: At [facility], you and your health needs are our priority.  As part of our continuing mission to provide you with exceptional heart care, we have created designated Provider Care Teams.  These Care Teams include your primary Cardiologist (physician) and Advanced Practice Providers (APPs -  Physician Assistants and Nurse Practitioners) who all work together to provide you with the care you need, when you need it.      Your next appointment:   4 TO 5 month(s) DEC 2021 OR [id number]  The format for your next appointment:   In Person  Provider:   Glenetta Hew, MD       Studies Ordered:   No orders of the defined types were placed in this encounter.    Glenetta Hew, M.D., M.S. Interventional Cardiologist   Pager # [phone] Phone # [phone] [address] [place], [place] [address]   Thank you for choosing Heart[facility] at [facility]!!

## 2019-12-22 ENCOUNTER — Telehealth: Payer: Self-pay

## 2019-12-22 NOTE — Telephone Encounter (Signed)
The pt states the nurse was supposed to call in his medicines on 12/17/2019 but have not. He said he is almost out of all his medicines. I told him I will have Dr. Lovena Le nurse give him a call back.

## 2019-12-22 NOTE — Telephone Encounter (Signed)
Medications had been filled on 12/17/19 as requested.  Call placed to Pt's pharmacy.  Per pharmacy only 3 of the requested 8 medications went through.  Verbally gave additional 5 prescriptions.  Left VM for Pt advising of pharmacy error and that medications were now called in.

## 2019-12-25 ENCOUNTER — Encounter: Payer: Self-pay | Admitting: Cardiology

## 2019-12-25 NOTE — Assessment & Plan Note (Addendum)
His blood pressures tend to go up and down.  When he does have hypertension, he feels much worse.  I think a lot of it was volume related.  The addition of Lasix has definitely made an improvement in his symptoms. His blood pressure recordings show that morning pressures may be a little bit low or his the higher pressures are in the evening.  Plan: Continue current meds but with adjustments as follows:  Continue carvedilol 12.5 mg twice daily  Take amlodipine in the p.m. dose of carvedilol, and lisinopril with a.m. dose  Continue standing dose of furosemide 40 mg daily with additional doses as needed based on weight related sliding scale.

## 2019-12-25 NOTE — Assessment & Plan Note (Signed)
He was initially not doing well with his BiPAP machine.  But he is now back to using his BiPAP doing well.

## 2019-12-25 NOTE — Assessment & Plan Note (Signed)
He is now over 6 months out from his pulmonary embolus, and his PCP is stopped warfarin.  As such, he is back on aspirin and Plavix.

## 2019-12-25 NOTE — Assessment & Plan Note (Signed)
Released by Dr. Julien Girt.  There was initial concern about possible pericardial effusion postop, however the echo did not show any evidence of effusion.  At this point, he is doing much better with revascularization.  Any chest pain that he had prior to his most recent hospitalization was more likely related to diastolic dysfunction or microvascular disease.  Continue to monitor for signs of ischemia, but hopefully we can hold off on screening evaluation with stress test for couple years.

## 2019-12-25 NOTE — Assessment & Plan Note (Signed)
No longer having the significant anginal symptoms he was having.  We had increased his dose of Toprol which was then converted to carvedilol for better blood pressure control.  He was on Ranexa and Imdur.  Imdur stopped because of headache.  Now post CABG no longer on Ranexa.

## 2019-12-25 NOTE — Assessment & Plan Note (Signed)
Now being monitored by Dr. Lovena Le.  Appears to be working well.  He is pretty much now pacemaker dependent based on most recent evaluation.

## 2019-12-25 NOTE — Assessment & Plan Note (Signed)
On atorvastatin 40 mg daily.  Has not had follow-up labs since his CABG.  Labs look pretty well controlled at that time.  We will recheck when I see him back in follow-up if they have not already been checked by PCP.

## 2019-12-25 NOTE — Assessment & Plan Note (Addendum)
Status post BiV PPM- Stable.  Not followed by Dr. Lovena Le

## 2019-12-25 NOTE — Assessment & Plan Note (Signed)
He has multiple stents in the LAD as well as circumflex with now grafted downstream vessels, but there is significant upstream stents.  He also has at least 2 if not 3 stents in the nondominant RCA.  Since he is currently off of warfarin, we will put him back on aspirin and Plavix for now, but could likely stop aspirin and simply continue lifelong Plavix.   Okay to hold aspirin/Plavix 5 to 7 days preop for any procedures or surgeries.

## 2019-12-25 NOTE — Assessment & Plan Note (Signed)
When I last saw him, he was having progressive anginal symptoms and we proceeded with cardiac catheterization revealing multivessel disease that may have been overlooked at the time of his previous PCI.  He had a significant ostial circumflex lesion along with hemodynamically significant LAD lesion.  He was referred for CABG and now is no longer having anginal symptoms. 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  Since his last discharge no more angina.  Plan:   We will continue aspirin and Plavix given his multiple stents ->   okay to hold both aspirin and Plavix 5 to 7 days preop for any procedures or surgeries.  We have placed him on Ranexa, this was stopped after CABG.  Also no longer on Imdur because of headache. ->  Low threshold to consider reinstituting Ranexa  Continue empagliflozin along with furosemide standing dose)  Continue current dose of rosuvastatin  He is on stable dose of amlodipine, carvedilol and losartan, but his evening pressures are running a little higher than morning pressures with morning pressures being somewhat low.  Switch dosing intervals: take amlodipine along with the p.m. dose of carvedilol, and lisinopril with the a.m. dose.

## 2019-12-29 NOTE — Telephone Encounter (Signed)
[facility] transfer successful. LMOVM for patient (DPR) advising that next transmission is scheduled for 03/17/20 and should transmit automatically. DC number and office hours provided for any questions/concerns.

## 2020-03-17 ENCOUNTER — Ambulatory Visit (INDEPENDENT_AMBULATORY_CARE_PROVIDER_SITE_OTHER): Payer: Medicare Other

## 2020-03-17 DIAGNOSIS — I442 Atrioventricular block, complete: Secondary | ICD-10-CM | POA: Diagnosis not present

## 2020-03-17 LAB — CUP PACEART REMOTE DEVICE CHECK
Battery Remaining Longevity: 51 mo
Battery Voltage: 2.96 V
Brady Statistic AP VP Percent: 0.48 %
Brady Statistic AP VS Percent: 0.01 %
Brady Statistic AS VP Percent: 99.5 %
Brady Statistic AS VS Percent: 0.01 %
Brady Statistic RA Percent Paced: 0.49 %
Brady Statistic RV Percent Paced: 99.98 %
Date Time Interrogation Session: 20211119011332
Implantable Lead Implant Date: 20180315
Implantable Lead Implant Date: 20180315
Implantable Lead Implant Date: 20180315
Implantable Lead Location: 753858
Implantable Lead Location: 753859
Implantable Lead Location: 753860
Implantable Lead Model: 4076
Implantable Lead Model: 4298
Implantable Lead Model: 5076
Implantable Pulse Generator Implant Date: 20180315
Lead Channel Impedance Value: 266 Ohm
Lead Channel Impedance Value: 285 Ohm
Lead Channel Impedance Value: 323 Ohm
Lead Channel Impedance Value: 323 Ohm
Lead Channel Impedance Value: 361 Ohm
Lead Channel Impedance Value: 380 Ohm
Lead Channel Impedance Value: 399 Ohm
Lead Channel Impedance Value: 399 Ohm
Lead Channel Impedance Value: 418 Ohm
Lead Channel Impedance Value: 570 Ohm
Lead Channel Impedance Value: 589 Ohm
Lead Channel Impedance Value: 589 Ohm
Lead Channel Impedance Value: 608 Ohm
Lead Channel Impedance Value: 665 Ohm
Lead Channel Pacing Threshold Amplitude: 1 V
Lead Channel Pacing Threshold Amplitude: 1 V
Lead Channel Pacing Threshold Amplitude: 1.375 V
Lead Channel Pacing Threshold Pulse Width: 0.4 ms
Lead Channel Pacing Threshold Pulse Width: 0.4 ms
Lead Channel Pacing Threshold Pulse Width: 0.9 ms
Lead Channel Sensing Intrinsic Amplitude: 1.25 mV
Lead Channel Sensing Intrinsic Amplitude: 1.25 mV
Lead Channel Sensing Intrinsic Amplitude: 19.75 mV
Lead Channel Sensing Intrinsic Amplitude: 19.75 mV
Lead Channel Setting Pacing Amplitude: 2.5 V
Lead Channel Setting Pacing Amplitude: 2.5 V
Lead Channel Setting Pacing Amplitude: 2.75 V
Lead Channel Setting Pacing Pulse Width: 0.4 ms
Lead Channel Setting Pacing Pulse Width: 0.9 ms
Lead Channel Setting Sensing Sensitivity: 4 mV

## 2020-03-20 NOTE — Progress Notes (Signed)
Remote pacemaker transmission.   

## 2020-05-23 ENCOUNTER — Ambulatory Visit: Payer: Medicare Other | Admitting: Cardiology

## 2020-06-06 ENCOUNTER — Ambulatory Visit: Payer: Medicare Other | Admitting: Cardiology

## 2020-06-06 ENCOUNTER — Other Ambulatory Visit: Payer: Self-pay

## 2020-06-06 VITALS — BP 120/66 | HR 74 | Ht 68.0 in | Wt 208.2 lb

## 2020-06-06 DIAGNOSIS — I25119 Atherosclerotic heart disease of native coronary artery with unspecified angina pectoris: Secondary | ICD-10-CM | POA: Diagnosis not present

## 2020-06-06 DIAGNOSIS — I2699 Other pulmonary embolism without acute cor pulmonale: Secondary | ICD-10-CM

## 2020-06-06 DIAGNOSIS — I442 Atrioventricular block, complete: Secondary | ICD-10-CM | POA: Diagnosis not present

## 2020-06-06 DIAGNOSIS — G4733 Obstructive sleep apnea (adult) (pediatric): Secondary | ICD-10-CM

## 2020-06-06 DIAGNOSIS — Z951 Presence of aortocoronary bypass graft: Secondary | ICD-10-CM

## 2020-06-06 DIAGNOSIS — I10 Essential (primary) hypertension: Secondary | ICD-10-CM | POA: Diagnosis not present

## 2020-06-06 DIAGNOSIS — E1169 Type 2 diabetes mellitus with other specified complication: Secondary | ICD-10-CM

## 2020-06-06 DIAGNOSIS — E785 Hyperlipidemia, unspecified: Secondary | ICD-10-CM

## 2020-06-06 DIAGNOSIS — T45515D Adverse effect of anticoagulants, subsequent encounter: Secondary | ICD-10-CM

## 2020-06-06 NOTE — Progress Notes (Signed)
Primary Care Provider: Kristopher Glee., MD Cardiologist: Glenetta Hew, MD Electrophysiologist: None  Clinic Note: Chief Complaint  Patient presents with  . Follow-up  . Coronary Artery Disease    No angina  . Hypertension    Much better controlled now.  If anything would have hypotension.   ===================================  ASSESSMENT/PLAN   Problem List Items Addressed This Visit    Coronary artery disease involving native coronary artery of native heart with angina pectoris ([place]) - Primary (Chronic)    No further angina since his CABG, except associated with elevated blood pressures.  Plan: Continue current medications.  On moderate dose losartan plus carvedilol along with amlodipine for antianginal benefit.  On as needed Lasix.  On moderate dose statin with well-controlled lipids.  On aspirin and Plavix -> okay to stop aspirin and continue Plavix.  Okay to hold Plavix 5 days preop for surgeries or procedures.      Relevant Orders   EKG 12-Lead (Completed)   Pulmonary embolism on long-term anticoagulation therapy (HCC) (Chronic)    No longer on warfarin.  On aspirin and Plavix for CAD.      Hyperlipidemia associated with type 2 diabetes mellitus (HCC) (Chronic)    Labs look great on current dose of atorvastatin.  Improved since CABG.   On Jardiance along with glargine insulin.  A1c currently 8.4-up from last June.  Medications being adjusted by PCP.   I suspect that his PCP will recheck his lipids in June.      Relevant Medications   JARDIANCE 25 MG TABS tablet   Essential hypertension (Chronic)    Blood pressure is now finally stable.  He is on a regimen.  We have room to increase or decrease to either one of the 3 medications.  Since we adjust the timing of his medications, he is not having as much of the dizzy spells.  He is taking amlodipine in the p.m. and lisinopril in the a.m. He actually is now only taking furosemide on as-needed basis.       OSA treated with BiPAP (Chronic)    Back on BiPAP.  Doing well.      S/P CABG x 4 (Chronic)    He is almost a year out from his CABG.  No further signs or symptoms of ischemia.  Would plan Myoview Stress Test in 2025.      Relevant Orders   EKG 12-Lead (Completed)   Complete heart block (HCC) (Chronic)    Pacemaker in place.  Currently A sensed V paced.      Relevant Orders   EKG 12-Lead (Completed)     ===================================  HPI:    ANAS REISTER is a 60 y.o. male with a PMH notable for MV-CAD-multi-Vessel PCI ==>CABG (April 2021), ICM w/ CHRONIC HFrEF, h/o CHB-s/p PPM (06/2016), OSA on CPAP who presents today for 42-month follow-up.  CARDIAC HISTORY: Long history prior to Transfer of Care to [facility].   At least 12 stents placed-noted in LAD, OM1 and OM 2 as well as at least 4 in the L PDA into the nondominant RCA =>   December 2020-COVID-19 infection => May 16, 2019-acute hypoxic restaurant failure, DKA found to have bilateral PE.  Transferred Care - Initial visit 07/15/19 c/o retrosternal chest pain radiating to the jaw, off and on for a month.  He felt like he was not being "taken seriously" --> after initial plans for medical management were unsuccessful (Toprol plus Imdur) -> we decided to proceed with  cardiac catheterization for definitive evaluation.  Cath 08/20/2019: MV CAD -> patent stents in prox-mid LAD, prox OM1, prox LPA 1 and several stents in L PDA as well as RCA.  Ostial LCx 60% (DFR 0.64), along mid LAD 60% post stent (DFR 0.67) => referred for CABG x4 (Dr. Mickie Hillier PDA,?  RIMA-RI/OM1).  TTE 08/21/2019: EF 55 to 60%.  Normal WM.  GR 1 DD. .  Mild concentric LVH. Mildly elevated PAP -> mild-moderate TR.Marland Kitchen   Seen by Dr. Lovena Le (EP) for BiVPPM f/u 12/17/19 - established with [facility] EP  Diuresis complicated by GOUT -> ER. 7/12/202  HTN -   11/19/2019 -> ER for edema, headache, nausea and elevated  pressures with 9 pound weight gain => given IV Lasix   7/27-29/2021 -> admitted for hypertensive urgency (had not been taking losartan and Imdur)  Cards c/s => increased Coreg to 12.5 mg BID, Losartan to 50 mg, amlodipine 5 mg & PRN 90  No Dr. Quay Burow vertigo this abscess: He has had he has Lasix (per Sliding Scale).  Also started on SGLT2-I (Jardiance).   AADAN CHENIER was last seen on 12/21/2019- as his first follow-up with me since his CABG.  He was doing much better after diuresing well.  He delayed to take 1 or 2 doses of extra Lasix and was finally starting to feel better.  Gradually regaining energy.  Edema control.  Weight stable.  Averaging roughly 194 to 195 pounds.  Blood pressures actually also been relatively well controlled.  When his pressure was high and he had some chest discomfort.  Overall pleased. => Dosing times: Losartan 50 mg, carvedilol 12.5 mg in the morning; amlodipine 5 mg plus carvedilol 12.5 mg p.m. => If blood pressure low, hold either losartan or amlodipine but not carvedilol. => For SBP greater than 170, take furosemide 40 mg and sublingual nitro.  Recent Hospitalizations: N/A  Reviewed  CV studies:    The following studies were reviewed today: (if available, images/films reviewed: From Epic Chart or Care Everywhere) . N/A  Interval History:   LINKOLN ALKIRE returns today overall doing very well.  No major complaints..  He has not had any further angina since bypass.  His blood pressures have been much better controlled now.  He says his baby taking Lasix 2 days a week.  Otherwise he says that his blood pressure and edema been well controlled.  Feels very happy that he has been chest pain-free for so long.  The musculoskeletal chest pain is now resolving.  The only time he notes it, is when he is coughing.  CV Review of Symptoms (Summary): positive for - Well-controlled edema.  Off-and-on musculoskeletal chest pain. negative for - dyspnea on  exertion, irregular heartbeat, orthopnea, palpitations, paroxysmal nocturnal dyspnea, rapid heart rate, shortness of breath or Lightheadedness or dizziness, syncope/near syncope or TIA/amaurosis fugax.  Claudication.    The patient does not have symptoms concerning for COVID-19 infection (fever, chills, cough, or new shortness of breath).   REVIEWED OF SYSTEMS   Review of Systems  Constitutional: Negative for malaise/fatigue (Starting to get more energy) and weight loss (He actually gained weight.  Was less active over the following winter.).  HENT: Negative for congestion and nosebleeds.   Respiratory: Positive for shortness of breath (If he overexerts himself). Negative for cough.   Cardiovascular: Positive for chest pain (Musculoskeletal).  Gastrointestinal: Negative for abdominal pain, blood in stool, diarrhea and melena.  Genitourinary: Negative for hematuria.  Musculoskeletal: Positive for  joint pain. Negative for falls.  Neurological: Negative for dizziness (Sometimes if he stands up quickly), focal weakness, weakness and headaches.  Psychiatric/Behavioral: Negative for depression and memory loss. The patient is not nervous/anxious and does not have insomnia.    I have reviewed and (if needed) personally updated the patient's problem list, medications, allergies, past medical and surgical history, social and family history.   PAST MEDICAL HISTORY   Past Medical History:  Diagnosis Date  . Blepharitis of both eyes    Chronic  . Cataracts, both eyes   . CKD (chronic kidney disease) stage 3, GFR 30-59 ml/min (HCC)    s/p R Nephrectomy/adrenalectomy & DM-Nephropathy (baseline creatinine 1.4-1.5)  . COVID-19 virus infection 04/25/2019  . Diabetic peripheral neuropathy associated with type 2 diabetes mellitus ([place])   . Essential hypertension   . GERD without esophagitis   . History of basal cell carcinoma (BCC) excision   . History of complete heart block 06/2016   s/p BiV PPM  (now followed by Dr. Merlyn Lot - EP)  . History of non-ST elevation myocardial infarction (NSTEMI)    And several occasions of unstable angina  . Hx of CABGx4 08/25/2019   (Dr. Briant Cedar, [facility]): LIMA-LAD, Seq Rad- OM2-LPDA, RIMA-? OM1 (called Ramus on report, but no ramus seen on cath).  . Hyperlipidemia associated with type 2 diabetes mellitus ([place])    on atorvastatin 40 mg.  . Multiple Vessel CAD -- s/p PCI of LAD, OM1, LPL1, LPDA & non-dominant RCA --> now S/P CABG x 4 60%   (No cath report prior to 2016) (stents in proxLAD, prox OM1 x 2, prox LPL1 & 3-4 in L PDA & ~2 in  Known non-dominant RCA;  07/2019: MV CAD - patent stents in p-m LAD, p OM1, p LPL & LPDA as well as RCA -> Ost LCx ~60-70% (DFR ++), long mLAD 60% post-stent (DFR ++) --> referred for CABG  . Obesity (BMI 30.0-34.9)   . Occlusion of left vertebral artery    Consider repossible acute lesion in August 2020 with presentation of headache.  CTA suggested occluded left vertebral artery throughout the neck.  Faint string-like enhancement intermittently visible suggesting recent occlusion.  Partial reconstruction and posterior fossa.  Left PICA patent.  (Consider possible left vertebral artery dissection)  . OSA treated with BiPAP 02/2019   Diagnosis in late 2020 ([facility])--> delayed onset of treatment with BiPAP due to Covid hospitalization followed by PE. ->  BiPAP setting at 21/17 cm  . Pulmonary embolism ([place]) 05/18/2019   (1 month following COVID-19 infection): DVT-PE (bilateral PEs noted on VQ scan)-> started on warfarin.  (On warfarin plus Plavix now with aspirin stopped.)  . Type 2 diabetes mellitus with complication, with long-term current use of insulin (HCC)    On Lantus, Jardiance, Metformin & Onglyza    PAST SURGICAL HISTORY   Past Surgical History:  Procedure Laterality Date  . BASAL CELL CARCINOMA EXCISION  01/2015  . CAROTID DUPLEX SCAN  12/25/2018   [facility]: Mild plaque in both carotids.   139% bilateral.  Right vertebral flow normal antegrade, Left not seen; normal bilateral subclavian flow..  . CHOLECYSTECTOMY    . CORONARY ARTERY BYPASS GRAFT N/A 08/25/2019   Procedure: CORONARY ARTERY BYPASS GRAFTING (CABG) TIMES FOUR, ON PUMP, USING LEFT AND RIGHT INTERNAL MAMMARY ARTERY AND LEFT RADIAL ARTERY;  Surgeon: [name], MD;  Location: [facility] OR;  Service: Open Heart Surgery;; LIMA-LAD, Seq Rad OM2-LPDA, RIMA-OM1  . CORONARY STENT  INTERVENTION  04/10/2015   ([place]; Bishop Limbo, DO) -> DES PCI mLPDA: Xience Alpine DES 2.5 mm x 18 mm, Xience Alpine DES 2.25 mm x 12 mm overlapping.  . CORONARY STENT INTERVENTION  04/04/2016   ([place]; [place], MD)--> (urgent) 100% mLPDA PTCA with 2.25 mm balloon - reduced to 50%; mid OM2 90% - DES PCI (Xience DES  2.5 x 18).   . CORONARY STENT INTERVENTION  2015   Prior to December 2016, STENTS noted in prox LAD, proximal OM1, and at least 2 stents in proximal L PDA  . CORONARY STENT INTERVENTION  11/17/2018   ([place]; Bishop Limbo, DO): CULPRIT: mid RCA 90% (DES PCI) -Resolute Onyx DES 2.5 mm x 30 mm --> COMPLICATION: Large left arm hematoma-evaluated by vascular and orthopedic surgery, managed with splint and arm elevation.  . CORONARY STENT INTERVENTION  12/28/2018   ([place]; Clarene Critchley, MD, referred by Dr. Mathis Bud) --> INDICATION (urgent, Unstable Angina): Moderate-severe (70%) stenosis of ostial RCA  -> DES PCI Resolute Onyx DES 2.5 mm x 12 mm.   Marland Kitchen LEFT HEART CATH AND CORONARY ANGIOGRAPHY  04/10/2015   ([place]; Bishop Limbo, DO)--> EF 55%.  Mild inferior HK.  Coronaries-LM: Normal, p LAD STENT 30% ISR, mLAD 20% & diffuse dLAD ~20%; Dom LCx: mCx 20%, ~pOM1 STENT (noted as OM2 in other reports) patent with mid 30%, OM2 normal, prox LPDA "STENTS" patent with SEVERE mid L PDA 90-95% (DES PCI x 2 overlapping); Small-non-dominant RCA  patent   . LEFT HEART  CATH AND CORONARY ANGIOGRAPHY  04/04/2016   ([place]; [place], MD)--> (urgent): EF 70%.  Previous LAD,~OM2 and proximal PDA stents patent; -> mLAD 35%, dLAD 40%; LCx-OM1 75% (short lesion, med Rx), mid OM2 90% (DES PCI), pLPDA stents patent w/ mPDA 100% (PTCA only - reduced to 50%); non-dom RCA - ost RCA 60% & mRCA 75%  . LEFT HEART CATH AND CORONARY ANGIOGRAPHY  11/17/2018   ([place]; Bishop Limbo, DO) indication: Angina.  LM normal; LAD - ost LAD ~50%, pLAD STENT ~30% ISR with dLAD ~40%; Dom LCx - ost & prox Cx 30%, OM1 STENT 20% ISR, OM2 STENT 50% distal edge, pLPDA overlapping STENTS ~20%; nonDom RCA - proxRCA 40%, mid 90% (DES PCI)  . LEFT HEART CATH AND CORONARY ANGIOGRAPHY  12/28/2018   ([place]; Clarene Critchley, MD, referred by Dr. Mathis Bud) --> INDICATION (urgent, Unstable Angina): Moderate-severe (70%) stenosis of ostial RCA (DES PCI), mRCA 30%.  Otherwise no significant change from July 2020: dLM 20%, pLAD ~40% ISR , dLAD long/diffuse ~50%; OstLCx 30%, OM1 ~15% OM2 ~30% with patent  LPDA stents/PTCA site.   Marland Kitchen LEFT HEART CATH AND CORONARY ANGIOGRAPHY N/A 08/20/2019   Procedure: LEFT HEART CATH AND CORONARY ANGIOGRAPHY;  Surgeon: Leonie Man, MD;  Location: [place] [facility];  Service: Cardiovascular;; Multivessel CAD: Patent stents in proximal to mid LAD, proximal OM1, proximal LPL 1 and several stents in L PDA as well as RCA (nondominant).  Ostial LCx 60%-highly DFR +0.84.  There is a long mid LAD 60% stenosis after the stent--highly DFR +0.67.--> CABG  . NEPHRECTOMY Right 2012   With adrenalectomy  . NM MYOVIEW LTD  08/20/2018   [facility] -> Lexiscan Myoview: EF 81%.  No reversible ischemia or infarction.  Normal wall motion.  Marland Kitchen PACEMAKER IMPLANT  06/2016   [facility] [place] ([employer]) -> according to CT  of chest, leads positioned in right atrium, cardiac apex and coronary sinus (suggesting CRT-P - BiV  Pacing))  . RADIAL ARTERY HARVEST Left 08/25/2019   Procedure: RADIAL ARTERY HARVEST;  Surgeon: [name], MD;  Location: [place];  Service: Open Heart Surgery;  Laterality: Left;  . TEE WITHOUT CARDIOVERSION N/A 08/25/2019   Procedure: TRANSESOPHAGEAL ECHOCARDIOGRAM (TEE);  Surgeon: [name], MD;  Location: [place];  Service: Open Heart Surgery;  Laterality: N/A;  . TRANSTHORACIC ECHOCARDIOGRAM  11/2018; 05/01/2019   ([place]) a) EF 60-65%.  Mild TR.;; b)moderate concentric LVH.  EF 55 to 60%.  No R WMA.  Normal RV size and function.  Normal atrial sizes.  Normal valves.   Dx Cath 07/2019  Immunization History  Administered Date(s) Administered  . PFIZER(Purple Top)SARS-COV-2 Vaccination 10/28/2019    MEDICATIONS/ALLERGIES   Current Meds  Medication Sig  . albuterol (VENTOLIN HFA) 108 (90 Base) MCG/ACT inhaler Inhale 2 puffs into the lungs every 4 (four) hours as needed for wheezing or shortness of breath.  . allopurinol (ZYLOPRIM) 300 MG tablet Take 300 mg by mouth daily.  Marland Kitchen amLODipine (NORVASC) 5 MG tablet Take 1 tablet (5 mg total) by mouth daily.  Marland Kitchen aspirin EC 81 MG EC tablet Take 1 tablet (81 mg total) by mouth daily.  Marland Kitchen atorvastatin (LIPITOR) 40 MG tablet Take 1 tablet (40 mg total) by mouth daily.  . calcium-vitamin D (OSCAL WITH D) 500-200 MG-UNIT tablet Take 1 tablet by mouth daily. 50 MCG (2000UNIT ) TAB CHOLECALCIFEROL  . carvedilol (COREG) 12.5 MG tablet Take 1 tablet (12.5 mg total) by mouth 2 (two) times daily with a meal.  . clopidogrel (PLAVIX) 75 MG tablet Take 1 tablet (75 mg total) by mouth daily.  . colchicine 0.6 MG tablet Take 0.6 mg by mouth 2 (two) times daily.  . furosemide (LASIX) 40 MG tablet Take 1 tablet (40 mg total) by mouth daily as needed for fluid or edema (Poor more than 3 pound weight gain overnight or more than 5 pound weight gain over baseline).  . insulin glargine (LANTUS SOLOSTAR) 100 UNIT/ML Solostar Pen Inject 50 Units  into the skin every evening. 100UNITS/3 ML  . JARDIANCE 25 MG TABS tablet Take 25 mg by mouth daily.  Marland Kitchen losartan (COZAAR) 50 MG tablet Take 1 tablet (50 mg total) by mouth daily.  Marland Kitchen MAGNESIUM OXIDE PO Take 500 mg by mouth daily. TAKE 1  TABLET BY MOUTH NIGHTLY FOR LEG CRAMPS  . nitroGLYCERIN (NITROSTAT) 0.4 MG SL tablet Place 1 tablet (0.4 mg total) under the tongue every 5 (five) minutes as needed for chest pain.  . Omega-3 Fatty Acids (FISH OIL BURP-LESS) 1000 MG CAPS Take 1,000 mg by mouth 2 (two) times daily.  . ondansetron (ZOFRAN) 4 MG tablet Take 1 tablet (4 mg total) by mouth every 8 (eight) hours as needed for nausea or vomiting.  . pantoprazole (PROTONIX) 40 MG tablet Take 40 mg by mouth 2 (two) times daily before a meal.   . traMADol (ULTRAM) 50 MG tablet Take 50 mg by mouth every 6 (six) hours as needed for pain.    Allergies  Allergen Reactions  . Latex Rash  . Codeine Nausea And Vomiting  . Contrast Media [Iodinated Diagnostic Agents]     Reportedly cardiac arrest  . Integrilin [Eptifibatide]     Reportedly had shortness of breath, confusion.  . Tylenol [Acetaminophen]     Tylenol -3 with codiene  . Zithromax [Azithromycin]  Nausea And Vomiting  . Glipizide Rash    Headache    SOCIAL HISTORY/FAMILY HISTORY   Reviewed in Epic:  Pertinent findings:  Social History   Tobacco Use  . Smoking status: Never Smoker  . Smokeless tobacco: Never Used  Vaping Use  . Vaping Use: Never used  Substance Use Topics  . Alcohol use: No  . Drug use: No   Social History   Social History Narrative  . Not on file    OBJCTIVE -PE, EKG, labs   Wt Readings from Last 3 Encounters:  06/06/20 208 lb 3.2 oz (94.4 kg)  12/21/19 199 lb (90.3 kg)  12/17/19 194 lb (88 kg)    Physical Exam: BP 120/66   Pulse 74   Ht 5\' 8"  (1.727 m)   Wt 208 lb 3.2 oz (94.4 kg)   BMI 31.66 kg/m  Physical Exam Vitals reviewed.  Constitutional:      General: He is not in acute distress.     Appearance: Normal appearance. He is obese. He is not ill-appearing or toxic-appearing.     Comments: Healthy-appearing.  Well-groomed.  HENT:     Head: Normocephalic and atraumatic.  Neck:     Vascular: No carotid bruit or JVD.  Cardiovascular:     Rate and Rhythm: Normal rate and regular rhythm.  No extrasystoles are present.    Chest Wall: PMI is not displaced.     Pulses: Normal pulses.     Heart sounds: Normal heart sounds. No murmur heard. No friction rub. No gallop.   Pulmonary:     Effort: Pulmonary effort is normal. No respiratory distress.     Breath sounds: Normal breath sounds.  Chest:     Chest wall: No tenderness (Less tender over the sternal borders, sternal wires.).  Musculoskeletal:        General: No swelling. Normal range of motion.     Cervical back: Normal range of motion and neck supple.  Skin:    General: Skin is warm and dry.     Coloration: Skin is not pale.  Neurological:     General: No focal deficit present.     Mental Status: He is alert and oriented to person, place, and time. Mental status is at baseline.     Motor: No weakness.  Psychiatric:        Mood and Affect: Mood normal.        Behavior: Behavior normal.        Thought Content: Thought content normal.        Judgment: Judgment normal.      Adult ECG Report  Rate: 74 ;  Rhythm: AS-V Paced;   Narrative Interpretation: stable  Recent Labs:  reviewed  [facility] Related to Lipid Profile Component 10/13/19 09/17/18 08/16/17  LDL Direct 40 69 60  Total Cholesterol 91 123 110  Triglycerides 128 139 157High  HDL Cholesterol 30 33Low 35  Hemoglobin A1C Component 05/19/20 01/18/20 10/13/19  HEMOGLOBIN A1C 8.4  8.2High 6.9High     Renal Function Panel Component 05/19/20 01/18/20 11/19/19  Sodium 139 138 137  Potassium 4.3 4.6 4.8  Chloride 101 99Low 101  CO2 29 27 26   BUN 32 30.0High 26.0  Glucose 164  197High 171High  Calcium 9.8  9.2 9.1  Phosphorus 4.0  5.1High -  Albumin  4.4 4.0 3.5  Creatinine 1.82 1.75High 1.40High   --> Nephrology thinks elevated Cr related to Lasix or Colchicine  CBC and Differential Component  05/19/20 01/18/20 11/19/19  WBC 6.3 5.5 6.9  RBC 5.43 5.35 4.48Low  Hemoglobin 14.7 13.4Low 11.1Low  Hematocrit 43.9 41.4Low 34.5Low  MCV 80.8 77.3Low 77.0Low  MCH 27.0 25.1Low 24.7Low  MCHC 33.5 32.4Low 32.1Low  RDW 16.5 19.6High 16.4  Platelets 143 147Low 157    Lab Results  Component Value Date   CHOL 122 08/24/2019   HDL 29 (L) 08/24/2019   LDLCALC 64 08/24/2019   TRIG 78 08/25/2019   CHOLHDL 4.2 08/24/2019    No results found for: TSH  ==================================================  COVID-19 Education: The signs and symptoms of COVID-19 were discussed with the patient and how to seek care for testing (follow up with PCP or arrange E-visit).   The importance of social distancing and COVID-19 vaccination was discussed today. The patient is practicing social distancing & Masking.   I spent a total of 34 minutes with the patient spent in direct patient consultation.  Additional time spent with chart review  / charting (studies, outside notes, etc): 10 min Total Time: 44 min   Current medicines are reviewed at length with the patient today.  (+/- concerns) n/a  This visit occurred during the SARS-CoV-2 public health emergency.  Safety protocols were in place, including screening questions prior to the visit, additional usage of staff PPE, and extensive cleaning of exam room while observing appropriate contact time as indicated for disinfecting solutions.  Notice: This dictation was prepared with Dragon dictation along with smaller phrase technology. Any transcriptional errors that result from this process are unintentional and may not be corrected upon review.  Patient Instructions / Medication Changes & Studies & Tests Ordered   Patient  Instructions  Medication Instructions:  No changes  *If you need a refill on your cardiac medications before your next appointment, please call your pharmacy*   Lab Work: Not needed  Testing/Procedures: Not needed   Follow-Up: At [facility], you and your health needs are our priority.  As part of our continuing mission to provide you with exceptional heart care, we have created designated Provider Care Teams.  These Care Teams include your primary Cardiologist (physician) and Advanced Practice Providers (APPs -  Physician Assistants and Nurse Practitioners) who all work together to provide you with the care you need, when you need it.  Your next appointment:   6 month(s)  The format for your next appointment:   In Person  Provider:   Glenetta Hew, MD     Studies Ordered:   Orders Placed This Encounter  Procedures  . EKG 12-Lead     Glenetta Hew, M.D., M.S. Interventional Cardiologist   Pager # [phone] Phone # [phone] [address] [place], [place] [address]   Thank you for choosing Heartcare at [facility]!!

## 2020-06-06 NOTE — Patient Instructions (Addendum)

## 2020-06-16 ENCOUNTER — Ambulatory Visit (INDEPENDENT_AMBULATORY_CARE_PROVIDER_SITE_OTHER): Payer: Medicare Other

## 2020-06-16 DIAGNOSIS — I442 Atrioventricular block, complete: Secondary | ICD-10-CM | POA: Diagnosis not present

## 2020-06-18 LAB — CUP PACEART REMOTE DEVICE CHECK
Battery Remaining Longevity: 63 mo
Battery Voltage: 2.96 V
Brady Statistic AP VP Percent: 0.48 %
Brady Statistic AP VS Percent: 0.01 %
Brady Statistic AS VP Percent: 99.5 %
Brady Statistic AS VS Percent: 0.01 %
Brady Statistic RA Percent Paced: 0.49 %
Brady Statistic RV Percent Paced: 99.98 %
Date Time Interrogation Session: 20220219193029
Implantable Lead Implant Date: 20180315
Implantable Lead Implant Date: 20180315
Implantable Lead Implant Date: 20180315
Implantable Lead Location: 753858
Implantable Lead Location: 753859
Implantable Lead Location: 753860
Implantable Lead Model: 4076
Implantable Lead Model: 4298
Implantable Lead Model: 5076
Implantable Pulse Generator Implant Date: 20180315
Lead Channel Impedance Value: 285 Ohm
Lead Channel Impedance Value: 323 Ohm
Lead Channel Impedance Value: 342 Ohm
Lead Channel Impedance Value: 361 Ohm
Lead Channel Impedance Value: 361 Ohm
Lead Channel Impedance Value: 399 Ohm
Lead Channel Impedance Value: 399 Ohm
Lead Channel Impedance Value: 418 Ohm
Lead Channel Impedance Value: 437 Ohm
Lead Channel Impedance Value: 608 Ohm
Lead Channel Impedance Value: 608 Ohm
Lead Channel Impedance Value: 646 Ohm
Lead Channel Impedance Value: 646 Ohm
Lead Channel Impedance Value: 703 Ohm
Lead Channel Pacing Threshold Amplitude: 0.875 V
Lead Channel Pacing Threshold Amplitude: 1 V
Lead Channel Pacing Threshold Amplitude: 1 V
Lead Channel Pacing Threshold Pulse Width: 0.4 ms
Lead Channel Pacing Threshold Pulse Width: 0.4 ms
Lead Channel Pacing Threshold Pulse Width: 0.9 ms
Lead Channel Sensing Intrinsic Amplitude: 1.375 mV
Lead Channel Sensing Intrinsic Amplitude: 1.375 mV
Lead Channel Sensing Intrinsic Amplitude: 19.75 mV
Lead Channel Sensing Intrinsic Amplitude: 19.75 mV
Lead Channel Setting Pacing Amplitude: 1.5 V
Lead Channel Setting Pacing Amplitude: 2 V
Lead Channel Setting Pacing Amplitude: 2.5 V
Lead Channel Setting Pacing Pulse Width: 0.4 ms
Lead Channel Setting Pacing Pulse Width: 0.9 ms
Lead Channel Setting Sensing Sensitivity: 4 mV

## 2020-06-20 NOTE — Progress Notes (Signed)
Remote pacemaker transmission.   

## 2020-06-25 ENCOUNTER — Encounter: Payer: Self-pay | Admitting: Cardiology

## 2020-06-25 NOTE — Assessment & Plan Note (Signed)
Blood pressure is now finally stable.  He is on a regimen.  We have room to increase or decrease to either one of the 3 medications.  Since we adjust the timing of his medications, he is not having as much of the dizzy spells.  He is taking amlodipine in the p.m. and lisinopril in the a.m. He actually is now only taking furosemide on as-needed basis.

## 2020-06-25 NOTE — Assessment & Plan Note (Signed)
No longer on warfarin.  On aspirin and Plavix for CAD.

## 2020-06-25 NOTE — Assessment & Plan Note (Signed)
Pacemaker in place.  Currently A sensed V paced.

## 2020-06-25 NOTE — Assessment & Plan Note (Addendum)
Labs look great on current dose of atorvastatin.  Improved since CABG.   On Jardiance along with glargine insulin.  A1c currently 8.4-up from last June.  Medications being adjusted by PCP.   I suspect that his PCP will recheck his lipids in June.

## 2020-06-25 NOTE — Assessment & Plan Note (Signed)
No further angina since his CABG, except associated with elevated blood pressures.  Plan: Continue current medications.  On moderate dose losartan plus carvedilol along with amlodipine for antianginal benefit.  On as needed Lasix.  On moderate dose statin with well-controlled lipids.  On aspirin and Plavix -> okay to stop aspirin and continue Plavix.  Okay to hold Plavix 5 days preop for surgeries or procedures.

## 2020-06-25 NOTE — Assessment & Plan Note (Signed)
He is almost a year out from his CABG.  No further signs or symptoms of ischemia.  Would plan Myoview Stress Test in 2025.

## 2020-06-25 NOTE — Assessment & Plan Note (Signed)
Back on BiPAP.  Doing well.

## 2020-09-15 ENCOUNTER — Ambulatory Visit (INDEPENDENT_AMBULATORY_CARE_PROVIDER_SITE_OTHER): Payer: Medicare Other

## 2020-09-15 DIAGNOSIS — I442 Atrioventricular block, complete: Secondary | ICD-10-CM | POA: Diagnosis not present

## 2020-09-18 LAB — CUP PACEART REMOTE DEVICE CHECK
Battery Remaining Longevity: 57 mo
Battery Voltage: 2.95 V
Brady Statistic AP VP Percent: 0.34 %
Brady Statistic AP VS Percent: 0.01 %
Brady Statistic AS VP Percent: 99.64 %
Brady Statistic AS VS Percent: 0.01 %
Brady Statistic RA Percent Paced: 0.34 %
Brady Statistic RV Percent Paced: 99.98 %
Date Time Interrogation Session: 20220520021412
Implantable Lead Implant Date: 20180315
Implantable Lead Implant Date: 20180315
Implantable Lead Implant Date: 20180315
Implantable Lead Location: 753858
Implantable Lead Location: 753859
Implantable Lead Location: 753860
Implantable Lead Model: 4076
Implantable Lead Model: 4298
Implantable Lead Model: 5076
Implantable Pulse Generator Implant Date: 20180315
Lead Channel Impedance Value: 266 Ohm
Lead Channel Impedance Value: 285 Ohm
Lead Channel Impedance Value: 323 Ohm
Lead Channel Impedance Value: 323 Ohm
Lead Channel Impedance Value: 380 Ohm
Lead Channel Impedance Value: 399 Ohm
Lead Channel Impedance Value: 399 Ohm
Lead Channel Impedance Value: 399 Ohm
Lead Channel Impedance Value: 437 Ohm
Lead Channel Impedance Value: 589 Ohm
Lead Channel Impedance Value: 589 Ohm
Lead Channel Impedance Value: 589 Ohm
Lead Channel Impedance Value: 608 Ohm
Lead Channel Impedance Value: 665 Ohm
Lead Channel Pacing Threshold Amplitude: 1 V
Lead Channel Pacing Threshold Amplitude: 1 V
Lead Channel Pacing Threshold Amplitude: 1.375 V
Lead Channel Pacing Threshold Pulse Width: 0.4 ms
Lead Channel Pacing Threshold Pulse Width: 0.4 ms
Lead Channel Pacing Threshold Pulse Width: 0.9 ms
Lead Channel Sensing Intrinsic Amplitude: 1 mV
Lead Channel Sensing Intrinsic Amplitude: 1 mV
Lead Channel Sensing Intrinsic Amplitude: 19.75 mV
Lead Channel Sensing Intrinsic Amplitude: 19.75 mV
Lead Channel Setting Pacing Amplitude: 1.5 V
Lead Channel Setting Pacing Amplitude: 2.5 V
Lead Channel Setting Pacing Amplitude: 2.75 V
Lead Channel Setting Pacing Pulse Width: 0.4 ms
Lead Channel Setting Pacing Pulse Width: 0.9 ms
Lead Channel Setting Sensing Sensitivity: 4 mV

## 2020-10-02 NOTE — Progress Notes (Signed)
Remote pacemaker transmission.   

## 2020-11-14 ENCOUNTER — Telehealth: Payer: Self-pay | Admitting: Cardiology

## 2020-11-14 NOTE — Telephone Encounter (Signed)
Pt called to report that he had a boil that has been bothering him for about 3 days and was swollen and burst in the shower last night and was filled with blood and pus... he denies fever but it is still very red and raised.   His PCP had him call cardiology since he reported to them that the location was just under his CABG scar from 07/2019... I have advised him to please call them back and let them know that the scar is not new and hopefully they can see him to assess for possible infection.

## 2020-11-14 NOTE — Telephone Encounter (Signed)
John Dodson is calling stating he had a big blister come up 3 days ago in the middle of his incision scar from his heart surgery. He states last night while showering it burst and had a lot of blood and puss coming out of it.  He reports he called his PCP in regards to this and they advised him to reach out to our office. Please advise.

## 2020-11-15 NOTE — Telephone Encounter (Signed)
So as a cardiologist, I would not be the one that they would call about a wound at the sternotomy site.  They will be sending it if there is concern, they should contact the surgeons office.  But PCP can probably treat a boil as possible cellulitis or mild abscess.  I would probably have them call the CT surgery office to determine what the next best step would be.  Glenetta Hew, MD

## 2020-11-15 NOTE — Telephone Encounter (Signed)
Spoke to patient . Patient states he went to primary for evaluation - Primary assessed the area - wound culture - dressed the area -  informed any other issue may need to contact Surgeons.

## 2020-12-15 ENCOUNTER — Ambulatory Visit (INDEPENDENT_AMBULATORY_CARE_PROVIDER_SITE_OTHER): Payer: Medicare Other

## 2020-12-15 DIAGNOSIS — I442 Atrioventricular block, complete: Secondary | ICD-10-CM | POA: Diagnosis not present

## 2020-12-15 LAB — CUP PACEART REMOTE DEVICE CHECK
Battery Remaining Longevity: 50 mo
Battery Voltage: 2.95 V
Brady Statistic AP VP Percent: 0.08 %
Brady Statistic AP VS Percent: 0.01 %
Brady Statistic AS VP Percent: 99.91 %
Brady Statistic AS VS Percent: 0 %
Brady Statistic RA Percent Paced: 0.09 %
Brady Statistic RV Percent Paced: 99.99 %
Date Time Interrogation Session: 20220819021531
Implantable Lead Implant Date: 20180315
Implantable Lead Implant Date: 20180315
Implantable Lead Implant Date: 20180315
Implantable Lead Location: 753858
Implantable Lead Location: 753859
Implantable Lead Location: 753860
Implantable Lead Model: 4076
Implantable Lead Model: 4298
Implantable Lead Model: 5076
Implantable Pulse Generator Implant Date: 20180315
Lead Channel Impedance Value: 266 Ohm
Lead Channel Impedance Value: 304 Ohm
Lead Channel Impedance Value: 323 Ohm
Lead Channel Impedance Value: 323 Ohm
Lead Channel Impedance Value: 380 Ohm
Lead Channel Impedance Value: 399 Ohm
Lead Channel Impedance Value: 399 Ohm
Lead Channel Impedance Value: 456 Ohm
Lead Channel Impedance Value: 456 Ohm
Lead Channel Impedance Value: 589 Ohm
Lead Channel Impedance Value: 608 Ohm
Lead Channel Impedance Value: 646 Ohm
Lead Channel Impedance Value: 665 Ohm
Lead Channel Impedance Value: 741 Ohm
Lead Channel Pacing Threshold Amplitude: 1.125 V
Lead Channel Pacing Threshold Amplitude: 1.25 V
Lead Channel Pacing Threshold Amplitude: 1.25 V
Lead Channel Pacing Threshold Pulse Width: 0.4 ms
Lead Channel Pacing Threshold Pulse Width: 0.4 ms
Lead Channel Pacing Threshold Pulse Width: 0.9 ms
Lead Channel Sensing Intrinsic Amplitude: 1.125 mV
Lead Channel Sensing Intrinsic Amplitude: 1.125 mV
Lead Channel Sensing Intrinsic Amplitude: 19.75 mV
Lead Channel Sensing Intrinsic Amplitude: 19.75 mV
Lead Channel Setting Pacing Amplitude: 1.75 V
Lead Channel Setting Pacing Amplitude: 2.5 V
Lead Channel Setting Pacing Amplitude: 2.5 V
Lead Channel Setting Pacing Pulse Width: 0.4 ms
Lead Channel Setting Pacing Pulse Width: 0.9 ms
Lead Channel Setting Sensing Sensitivity: 4 mV

## 2020-12-30 NOTE — Progress Notes (Signed)
Remote pacemaker transmission.   

## 2021-01-04 ENCOUNTER — Other Ambulatory Visit: Payer: Self-pay | Admitting: Internal Medicine

## 2021-01-08 ENCOUNTER — Ambulatory Visit: Payer: Medicare Other | Admitting: Cardiology

## 2021-02-03 ENCOUNTER — Other Ambulatory Visit: Payer: Self-pay | Admitting: Internal Medicine

## 2021-02-27 ENCOUNTER — Other Ambulatory Visit: Payer: Self-pay

## 2021-02-27 ENCOUNTER — Telehealth: Payer: Self-pay | Admitting: Internal Medicine

## 2021-02-27 MED ORDER — CLOPIDOGREL BISULFATE 75 MG PO TABS: 75.0000 mg | ORAL_TABLET | Freq: Every day | ORAL | 0 refills | Status: DC

## 2021-02-27 NOTE — Telephone Encounter (Signed)
Medication has been pended and sent back to the [address] refill basket due to the fact that the medication was prescribed by Dr Carleene Overlie and the last refill states that the patient needs an appointment with Dr Marya Amsler even tho he is followed by dr harding and has an upcoming appointment.

## 2021-02-27 NOTE — Telephone Encounter (Signed)
Looks like the last refilled stated that the patient needed an appointment with Dr Carleene Overlie but the patient has an appointment scheduled with dr Ellyn Hack in January. Please advise refill due to the fact Dr Carleene Overlie is the prescribing physician of this medication.

## 2021-02-27 NOTE — Telephone Encounter (Signed)
*  STAT* If patient is at the pharmacy, call can be transferred to refill team.   1. Which medications need to be refilled? (please list name of each medication and dose if known)Clopidogrel  2. Which pharmacy/location (including street and city if local pharmacy) is medication to be sent to [employer] Dr. Alver Sorrow  3. Do they need a 30 day or 90 day supply? Enough until his appointment on 03-20-21

## 2021-03-16 ENCOUNTER — Ambulatory Visit (INDEPENDENT_AMBULATORY_CARE_PROVIDER_SITE_OTHER): Payer: Medicare Other

## 2021-03-16 DIAGNOSIS — I442 Atrioventricular block, complete: Secondary | ICD-10-CM | POA: Diagnosis not present

## 2021-03-16 LAB — CUP PACEART REMOTE DEVICE CHECK
Battery Remaining Longevity: 44 mo
Battery Voltage: 2.94 V
Brady Statistic AP VP Percent: 0.42 %
Brady Statistic AP VS Percent: 0.01 %
Brady Statistic AS VP Percent: 99.57 %
Brady Statistic AS VS Percent: 0 %
Brady Statistic RA Percent Paced: 0.42 %
Brady Statistic RV Percent Paced: 99.98 %
Date Time Interrogation Session: 20221118110144
Implantable Lead Implant Date: 20180315
Implantable Lead Implant Date: 20180315
Implantable Lead Implant Date: 20180315
Implantable Lead Location: 753858
Implantable Lead Location: 753859
Implantable Lead Location: 753860
Implantable Lead Model: 4076
Implantable Lead Model: 4298
Implantable Lead Model: 5076
Implantable Pulse Generator Implant Date: 20180315
Lead Channel Impedance Value: 266 Ohm
Lead Channel Impedance Value: 285 Ohm
Lead Channel Impedance Value: 342 Ohm
Lead Channel Impedance Value: 342 Ohm
Lead Channel Impedance Value: 399 Ohm
Lead Channel Impedance Value: 399 Ohm
Lead Channel Impedance Value: 418 Ohm
Lead Channel Impedance Value: 418 Ohm
Lead Channel Impedance Value: 437 Ohm
Lead Channel Impedance Value: 646 Ohm
Lead Channel Impedance Value: 646 Ohm
Lead Channel Impedance Value: 646 Ohm
Lead Channel Impedance Value: 665 Ohm
Lead Channel Impedance Value: 741 Ohm
Lead Channel Pacing Threshold Amplitude: 0.875 V
Lead Channel Pacing Threshold Amplitude: 1.125 V
Lead Channel Pacing Threshold Amplitude: 1.5 V
Lead Channel Pacing Threshold Pulse Width: 0.4 ms
Lead Channel Pacing Threshold Pulse Width: 0.4 ms
Lead Channel Pacing Threshold Pulse Width: 0.9 ms
Lead Channel Sensing Intrinsic Amplitude: 1.25 mV
Lead Channel Sensing Intrinsic Amplitude: 1.25 mV
Lead Channel Sensing Intrinsic Amplitude: 19.75 mV
Lead Channel Sensing Intrinsic Amplitude: 19.75 mV
Lead Channel Setting Pacing Amplitude: 2.25 V
Lead Channel Setting Pacing Amplitude: 2.5 V
Lead Channel Setting Pacing Amplitude: 3 V
Lead Channel Setting Pacing Pulse Width: 0.4 ms
Lead Channel Setting Pacing Pulse Width: 0.9 ms
Lead Channel Setting Sensing Sensitivity: 4 mV

## 2021-03-19 NOTE — Progress Notes (Signed)
Electrophysiology Office Note Date: 03/19/2021  ID:  John Dodson, DOB [birth year], MRN [id number]  PCP: Kristopher Glee., MD Primary Cardiologist: Glenetta Hew, MD Electrophysiologist: Cristopher Peru, MD   CC: Pacemaker follow-up  John Dodson is a 60 y.o. male seen today for Cristopher Peru, MD for routine electrophysiology followup.  Since last being seen in our clinic the patient reports doing well. He did have some vertiginous dizziness, for which his PCP decreased his losartan. Had a mechanical fall about 2 weeks ago.  he denies chest pain, palpitations, dyspnea, PND, orthopnea, nausea, vomiting, syncope, edema, weight gain, or early satiety.  Device History: Medtronic BiV PPM implanted 06/2016 for CHB  Past Medical History:  Diagnosis Date   Blepharitis of both eyes    Chronic   Cataracts, both eyes    CKD (chronic kidney disease) stage 3, GFR 30-59 ml/min (HCC)    s/p R Nephrectomy/adrenalectomy & DM-Nephropathy (baseline creatinine 1.4-1.5)   COVID-19 virus infection 04/25/2019   Diabetic peripheral neuropathy associated with type 2 diabetes mellitus (HCC)    Essential hypertension    GERD without esophagitis    History of basal cell carcinoma (BCC) excision    History of complete heart block 06/2016   s/p BiV PPM (now followed by Dr. Merlyn Lot - EP)   History of non-ST elevation myocardial infarction (NSTEMI)    And several occasions of unstable angina   Hx of CABGx4 08/25/2019   (Dr. Briant Cedar, [facility]): LIMA-LAD, Seq Rad- OM2-LPDA, RIMA-? OM1 (called Ramus on report, but no ramus seen on cath).   Hyperlipidemia associated with type 2 diabetes mellitus ([place])    on atorvastatin 40 mg.   Multiple Vessel CAD -- s/p PCI of LAD, OM1, LPL1, LPDA & non-dominant RCA --> now S/P CABG x 4 60%   (No cath report prior to 2016) (stents in proxLAD, prox OM1 x 2, prox LPL1 & 3-4 in L PDA & ~2 in  Known non-dominant RCA;  07/2019: MV CAD - patent stents in p-m LAD, p OM1, p  LPL & LPDA as well as RCA -> Ost LCx ~60-70% (DFR ++), long mLAD 60% post-stent (DFR ++) --> referred for CABG   Obesity (BMI 30.0-34.9)    Occlusion of left vertebral artery    Consider repossible acute lesion in August 2020 with presentation of headache.  CTA suggested occluded left vertebral artery throughout the neck.  Faint string-like enhancement intermittently visible suggesting recent occlusion.  Partial reconstruction and posterior fossa.  Left PICA patent.  (Consider possible left vertebral artery dissection)   OSA treated with BiPAP 02/2019   Diagnosis in late 2020 ([facility])--> delayed onset of treatment with BiPAP due to Covid hospitalization followed by PE. ->  BiPAP setting at 21/17 cm   Pulmonary embolism ([place]) 05/18/2019   (1 month following COVID-19 infection): DVT-PE (bilateral PEs noted on VQ scan)-> started on warfarin.  (On warfarin plus Plavix now with aspirin stopped.)   Type 2 diabetes mellitus with complication, with long-term current use of insulin ([place])    On Lantus, Jardiance, Metformin & Onglyza   Past Surgical History:  Procedure Laterality Date   BASAL CELL CARCINOMA EXCISION  01/2015   CAROTID DUPLEX SCAN  12/25/2018   [facility]: Mild plaque in both carotids.  139% bilateral.  Right vertebral flow normal antegrade, Left not seen; normal bilateral subclavian flow..   CHOLECYSTECTOMY     CORONARY ARTERY BYPASS GRAFT N/A 08/25/2019   Procedure: CORONARY ARTERY BYPASS GRAFTING (  CABG) TIMES FOUR, ON PUMP, USING LEFT AND RIGHT INTERNAL MAMMARY ARTERY AND LEFT RADIAL ARTERY;  Surgeon: [name], MD;  Location: [place] OR;  Service: Open Heart Surgery;; LIMA-LAD, Seq Rad OM2-LPDA, RIMA-OM1   CORONARY STENT INTERVENTION  04/10/2015   ([place]; Bishop Limbo, DO) -> DES PCI mLPDA: Xience Alpine DES 2.5 mm x 18 mm, Xience Alpine DES 2.25 mm x 12 mm overlapping.   CORONARY STENT INTERVENTION  04/04/2016   ([place]; Tovey, MD)--> (urgent) 100% mLPDA PTCA with 2.25 mm balloon - reduced to 50%; mid OM2 90% - DES PCI (Xience DES  2.5 x 18).    CORONARY STENT INTERVENTION  2015   Prior to December 2016, STENTS noted in prox LAD, proximal OM1, and at least 2 stents in proximal L PDA   CORONARY STENT INTERVENTION  11/17/2018   ([place]; Bishop Limbo, DO): CULPRIT: mid RCA 90% (DES PCI) -Resolute Onyx DES 2.5 mm x 30 mm --> COMPLICATION: Large left arm hematoma-evaluated by vascular and orthopedic surgery, managed with splint and arm elevation.   CORONARY STENT INTERVENTION  12/28/2018   ([place]; Clarene Critchley, MD, referred by Dr. Mathis Bud) --> INDICATION (urgent, Unstable Angina): Moderate-severe (70%) stenosis of ostial RCA  -> DES PCI Resolute Onyx DES 2.5 mm x 12 mm.    LEFT HEART CATH AND CORONARY ANGIOGRAPHY  04/10/2015   ([place]; Bishop Limbo, DO)--> EF 55%.  Mild inferior HK.  Coronaries-LM: Normal, p LAD STENT 30% ISR, mLAD 20% & diffuse dLAD ~20%; Dom LCx: mCx 20%, ~pOM1 STENT (noted as OM2 in other reports) patent with mid 30%, OM2 normal, prox LPDA "STENTS" patent with SEVERE mid L PDA 90-95% (DES PCI x 2 overlapping); Small-non-dominant RCA  patent    LEFT HEART CATH AND CORONARY ANGIOGRAPHY  04/04/2016   ([place]; [place], MD)--> (urgent): EF 70%.  Previous LAD,~OM2 and proximal PDA stents patent; -> mLAD 35%, dLAD 40%; LCx-OM1 75% (short lesion, med Rx), mid OM2 90% (DES PCI), pLPDA stents patent w/ mPDA 100% (PTCA only - reduced to 50%); non-dom RCA - ost RCA 60% & mRCA 75%   LEFT HEART CATH AND CORONARY ANGIOGRAPHY  11/17/2018   ([place]; Bishop Limbo, DO) indication: Angina.  LM normal; LAD - ost LAD ~50%, pLAD STENT ~30% ISR with dLAD ~40%; Dom LCx - ost & prox Cx 30%, OM1 STENT 20% ISR, OM2 STENT 50% distal edge, pLPDA overlapping STENTS ~20%; nonDom RCA - proxRCA 40%, mid 90% (DES PCI)   LEFT HEART CATH AND  CORONARY ANGIOGRAPHY  12/28/2018   ([place]; Clarene Critchley, MD, referred by Dr. Jeneen Rinks McGukin) --> INDICATION (urgent, Unstable Angina): Moderate-severe (70%) stenosis of ostial RCA (DES PCI), mRCA 30%.  Otherwise no significant change from July 2020: dLM 20%, pLAD ~40% ISR , dLAD long/diffuse ~50%; OstLCx 30%, OM1 ~15% OM2 ~30% with patent  LPDA stents/PTCA site.    LEFT HEART CATH AND CORONARY ANGIOGRAPHY N/A 08/20/2019   Procedure: LEFT HEART CATH AND CORONARY ANGIOGRAPHY;  Surgeon: Leonie Man, MD;  Location: [place] [facility];  Service: Cardiovascular;; Multivessel CAD: Patent stents in proximal to mid LAD, proximal OM1, proximal LPL 1 and several stents in L PDA as well as RCA (nondominant).  Ostial LCx 60%-highly DFR +0.84.  There is a long mid LAD 60% stenosis after the stent--highly DFR +0.67.--> CABG   NEPHRECTOMY Right 2012   With adrenalectomy   NM  MYOVIEW LTD  08/20/2018   [facility] -> Lexiscan Myoview: EF 81%.  No reversible ischemia or infarction.  Normal wall motion.   PACEMAKER IMPLANT  06/2016   [facility] [place] (Medtronic) -> according to CT of chest, leads positioned in right atrium, cardiac apex and coronary sinus (suggesting CRT-P - BiV Pacing))   RADIAL ARTERY HARVEST Left 08/25/2019   Procedure: RADIAL ARTERY HARVEST;  Surgeon: [name], MD;  Location: [place];  Service: Open Heart Surgery;  Laterality: Left;   TEE WITHOUT CARDIOVERSION N/A 08/25/2019   Procedure: TRANSESOPHAGEAL ECHOCARDIOGRAM (TEE);  Surgeon: [name], MD;  Location: [place];  Service: Open Heart Surgery;  Laterality: N/A;   TRANSTHORACIC ECHOCARDIOGRAM  11/2018; 05/01/2019   ([place]) a) EF 60-65%.  Mild TR.;; b)moderate concentric LVH.  EF 55 to 60%.  No R WMA.  Normal RV size and function.  Normal atrial sizes.  Normal valves.    Current Outpatient Medications  Medication Sig Dispense Refill   albuterol (VENTOLIN HFA) 108  (90 Base) MCG/ACT inhaler Inhale 2 puffs into the lungs every 4 (four) hours as needed for wheezing or shortness of breath.     allopurinol (ZYLOPRIM) 300 MG tablet Take 300 mg by mouth daily.     amLODipine (NORVASC) 5 MG tablet Take 1 tablet (5 mg total) by mouth daily. 90 tablet 3   aspirin EC 81 MG EC tablet Take 1 tablet (81 mg total) by mouth daily.     atorvastatin (LIPITOR) 40 MG tablet Take 1 tablet (40 mg total) by mouth daily. 90 tablet 3   calcium-vitamin D (OSCAL WITH D) 500-200 MG-UNIT tablet Take 1 tablet by mouth daily. 50 MCG (2000UNIT ) TAB CHOLECALCIFEROL     carvedilol (COREG) 12.5 MG tablet Take 1 tablet (12.5 mg total) by mouth 2 (two) times daily with a meal. 180 tablet 3   clopidogrel (PLAVIX) 75 MG tablet Take 1 tablet (75 mg total) by mouth daily. Please keep scheduled appointment in Nov for further refills. 30 tablet 0   colchicine 0.6 MG tablet Take 0.6 mg by mouth 2 (two) times daily.     furosemide (LASIX) 40 MG tablet Take 1 tablet (40 mg total) by mouth daily as needed for fluid or edema (Poor more than 3 pound weight gain overnight or more than 5 pound weight gain over baseline). 30 tablet 11   insulin glargine (LANTUS SOLOSTAR) 100 UNIT/ML Solostar Pen Inject 50 Units into the skin every evening. 100UNITS/3 ML     JARDIANCE 25 MG TABS tablet Take 25 mg by mouth daily.     losartan (COZAAR) 50 MG tablet Take 1 tablet (50 mg total) by mouth daily. 90 tablet 3   MAGNESIUM OXIDE PO Take 500 mg by mouth daily. TAKE 1  TABLET BY MOUTH NIGHTLY FOR LEG CRAMPS     nitroGLYCERIN (NITROSTAT) 0.4 MG SL tablet Place 1 tablet (0.4 mg total) under the tongue every 5 (five) minutes as needed for chest pain. 30 tablet 3   Omega-3 Fatty Acids (FISH OIL BURP-LESS) 1000 MG CAPS Take 1,000 mg by mouth 2 (two) times daily.     ondansetron (ZOFRAN) 4 MG tablet Take 1 tablet (4 mg total) by mouth every 8 (eight) hours as needed for nausea or vomiting. 20 tablet 0   pantoprazole (PROTONIX)  40 MG tablet Take 40 mg by mouth 2 (two) times daily before a meal.      traMADol (ULTRAM) 50  MG tablet Take 50 mg by mouth every 6 (six) hours as needed for pain.     No current facility-administered medications for this visit.    Allergies:   Latex, Codeine, Contrast media [iodinated diagnostic agents], Integrilin [eptifibatide], Tylenol [acetaminophen], Zithromax [azithromycin], and Glipizide   Social History: Social History   Socioeconomic History   Marital status: Married    Spouse name: Not on file   Number of children: Not on file   Years of education: Not on file   Highest education level: Not on file  Occupational History   Not on file  Tobacco Use   Smoking status: Never   Smokeless tobacco: Never  Vaping Use   Vaping Use: Never used  Substance and Sexual Activity   Alcohol use: No   Drug use: No   Sexual activity: Not on file  Other Topics Concern   Not on file  Social History Narrative   Not on file   Social Determinants of Health   Financial Resource Strain: Not on file  Food Insecurity: Not on file  Transportation Needs: Not on file  Physical Activity: Not on file  Stress: Not on file  Social Connections: Not on file  Intimate Partner Violence: Not on file    Family History: Family History  Problem Relation Age of Onset   Stroke Mother    Hypertension Mother    Hyperlipidemia Mother    Hypertension Father    Hyperlipidemia Father    Coronary artery disease Father      Review of Systems: All other systems reviewed and are otherwise negative except as noted above.  Physical Exam: There were no vitals filed for this visit.   GEN- The patient is well appearing, alert and oriented x 3 today.   HEENT: normocephalic, atraumatic; sclera clear, conjunctiva pink; hearing intact; oropharynx clear; neck supple  Lungs- Clear to ausculation bilaterally, normal work of breathing.  No wheezes, rales, rhonchi Heart- Regular rate and rhythm, no murmurs,  rubs or gallops  GI- soft, non-tender, non-distended, bowel sounds present  Extremities- no clubbing or cyanosis. No edema MS- no significant deformity or atrophy Skin- warm and dry, no rash or lesion; PPM pocket well healed Psych- euthymic mood, full affect Neuro- strength and sensation are intact  PPM Interrogation- reviewed in detail today,  See PACEART report  EKG:  EKG is ordered today. Personal review of ekg ordered today shows A-sensed BiV paced at 18 bpm, QRS 150 ms in RBBB pattern   Recent Labs: No results found for requested labs within last 8760 hours.   Wt Readings from Last 3 Encounters:  06/06/20 208 lb 3.2 oz (94.4 kg)  12/21/19 199 lb (90.3 kg)  12/17/19 194 lb (88 kg)     Other studies Reviewed: Additional studies/ records that were reviewed today include: Previous EP office notes, Previous remote checks, Most recent labwork.   Assessment and Plan:  1. CHB s/p Medtronic PPM  Normal PPM function See Pace Art report No changes today  2. CAD  No s/s ischemia  3. HTN Stable on current regimen   4. Vertigo-like dizziness Room spinning with looking upward. PCP follow up.  Current medicines are reviewed at length with the patient today.     Disposition:   Follow up with Dr. Lovena Le in 12 months. Sooner with issues.   Jacalyn Lefevre, PA-C  03/19/2021 10:06 AM  [facility] [address] [place] [address] [phone] (office) [phone] (fax)

## 2021-03-20 ENCOUNTER — Ambulatory Visit: Payer: Medicare Other | Admitting: Student

## 2021-03-20 ENCOUNTER — Encounter: Payer: Self-pay | Admitting: Student

## 2021-03-20 ENCOUNTER — Other Ambulatory Visit: Payer: Self-pay

## 2021-03-20 ENCOUNTER — Encounter: Payer: Medicare Other | Admitting: Student

## 2021-03-20 VITALS — BP 118/60 | HR 81 | Ht 68.0 in | Wt 209.0 lb

## 2021-03-20 DIAGNOSIS — I10 Essential (primary) hypertension: Secondary | ICD-10-CM

## 2021-03-20 DIAGNOSIS — I25119 Atherosclerotic heart disease of native coronary artery with unspecified angina pectoris: Secondary | ICD-10-CM | POA: Diagnosis not present

## 2021-03-20 DIAGNOSIS — I442 Atrioventricular block, complete: Secondary | ICD-10-CM

## 2021-03-20 LAB — CUP PACEART INCLINIC DEVICE CHECK
Battery Remaining Longevity: 45 mo
Battery Voltage: 2.94 V
Brady Statistic AP VP Percent: 0.37 %
Brady Statistic AP VS Percent: 0.01 %
Brady Statistic AS VP Percent: 99.61 %
Brady Statistic AS VS Percent: 0.01 %
Brady Statistic RA Percent Paced: 0.37 %
Brady Statistic RV Percent Paced: 99.98 %
Date Time Interrogation Session: 20221122082631
Implantable Lead Implant Date: 20180315
Implantable Lead Implant Date: 20180315
Implantable Lead Implant Date: 20180315
Implantable Lead Location: 753858
Implantable Lead Location: 753859
Implantable Lead Location: 753860
Implantable Lead Model: 4076
Implantable Lead Model: 4298
Implantable Lead Model: 5076
Implantable Pulse Generator Implant Date: 20180315
Lead Channel Impedance Value: 285 Ohm
Lead Channel Impedance Value: 304 Ohm
Lead Channel Impedance Value: 342 Ohm
Lead Channel Impedance Value: 342 Ohm
Lead Channel Impedance Value: 399 Ohm
Lead Channel Impedance Value: 418 Ohm
Lead Channel Impedance Value: 437 Ohm
Lead Channel Impedance Value: 456 Ohm
Lead Channel Impedance Value: 456 Ohm
Lead Channel Impedance Value: 646 Ohm
Lead Channel Impedance Value: 646 Ohm
Lead Channel Impedance Value: 665 Ohm
Lead Channel Impedance Value: 703 Ohm
Lead Channel Impedance Value: 741 Ohm
Lead Channel Pacing Threshold Amplitude: 1.125 V
Lead Channel Pacing Threshold Amplitude: 1.125 V
Lead Channel Pacing Threshold Amplitude: 1.5 V
Lead Channel Pacing Threshold Pulse Width: 0.4 ms
Lead Channel Pacing Threshold Pulse Width: 0.4 ms
Lead Channel Pacing Threshold Pulse Width: 0.9 ms
Lead Channel Sensing Intrinsic Amplitude: 1.125 mV
Lead Channel Sensing Intrinsic Amplitude: 1.25 mV
Lead Channel Sensing Intrinsic Amplitude: 19.75 mV
Lead Channel Sensing Intrinsic Amplitude: 19.75 mV
Lead Channel Setting Pacing Amplitude: 2.25 V
Lead Channel Setting Pacing Amplitude: 2.5 V
Lead Channel Setting Pacing Amplitude: 3 V
Lead Channel Setting Pacing Pulse Width: 0.4 ms
Lead Channel Setting Pacing Pulse Width: 0.9 ms
Lead Channel Setting Sensing Sensitivity: 4 mV

## 2021-03-20 NOTE — Patient Instructions (Signed)
Medication Instructions:  Your physician recommends that you continue on your current medications as directed. Please refer to the Current Medication list given to you today.  *If you need a refill on your cardiac medications before your next appointment, please call your pharmacy*   Lab Work: None If you have labs (blood work) drawn today and your tests are completely normal, you will receive your results only by: MyChart Message (if you have MyChart) OR A paper copy in the mail If you have any lab test that is abnormal or we need to change your treatment, we will call you to review the results.   Follow-Up: At [facility], you and your health needs are our priority.  As part of our continuing mission to provide you with exceptional heart care, we have created designated Provider Care Teams.  These Care Teams include your primary Cardiologist (physician) and Advanced Practice Providers (APPs -  Physician Assistants and Nurse Practitioners) who all work together to provide you with the care you need, when you need it.  Your next appointment:   1 year(s)  The format for your next appointment:   In Person  Provider:   You may see Gregg Taylor, MD or one of the following Advanced Practice Providers on your designated Care Team:   Renee Ursuy, PA-C Michael "Andy" Tillery, PA-C   

## 2021-03-26 NOTE — Progress Notes (Signed)
Remote pacemaker transmission.   

## 2021-05-15 NOTE — Progress Notes (Addendum)
Primary Care Provider: Kristopher Glee., MD Cardiologist: Glenetta Hew, MD Electrophysiologist: Cristopher Peru, MD  Clinic Note: No chief complaint on file.   ===================================  ASSESSMENT/PLAN   Problem List Items Addressed This Visit       Cardiology Problems   Pulmonary embolism on long-term anticoagulation therapy ([place]) (Chronic)    No longer on warfarin.  Will maintain on Plavix long-term.      Relevant Medications   nitroGLYCERIN (NITROSTAT) 0.4 MG SL tablet   Non-STEMI (non-ST elevated myocardial infarction) (HCC) (Chronic)    Distant history of non-STEMI, dating back to at least 2020.   However presented with progressive angina and underwent cardiac catheterization in April 2021.  Found to have multivessel disease and referred for CABG.  Has not had any further anginal symptoms since CABG.  Echo had preserved EF with no R WMA to suggest large infarct..      Relevant Medications   nitroGLYCERIN (NITROSTAT) 0.4 MG SL tablet   Hyperlipidemia associated with type 2 diabetes mellitus (HCC) (Chronic)    HLD associated with T2DM  Last lipid panel 08/2020 with LDL 62, at goal.  -Continue Atorvastatin 40 mg daily   Last A1c 02/2021 6.6, at goal. Excellent management by PCP. On Jardiance, Ozempic and Lantus.  -per PCP       Relevant Medications   nitroGLYCERIN (NITROSTAT) 0.4 MG SL tablet   Essential hypertension (Chronic)    Essential HTN:  BP today normotensive.  -Continue Amlodipine 5 mg -Continue Carvedilol 12.5 mg -Continue Losartan 25 mg    Stable blood pressure.  No change.      Relevant Medications   nitroGLYCERIN (NITROSTAT) 0.4 MG SL tablet   CAD S/P PCI; no further angina (Chronic)    History of multiple PCI to the LAD and LCx followed by CABG x4 (Dr. Mickie Hillier PDA,?  RIMA-RI/OM1)..  He does have significant upstream stents and there could be upstream microvascular disease.  This was following his heart  catheterization back in April 2021.  CAD CP x 2 weeks with associated SOB -> on further investigation, this seems to be more musculoskeletal in nature with point tenderness along the left lateral rib.  Patient has continued on ASA and Plavix. Last LHC in 2021. At last visit, plan was to d/c ASA and continue single anti-platelet therapy with Plavix.-->  Recommended that he stop ASA 81 mg  -Continue carvedilol, amlodipine -as antianginals, along with Losartan for afterload reduction. -Lasix PRN  -Continue Atorvastatin  -Continue Jardiance  (Hold Plavix while taking anti-inflammatories ) Okay to hold Plavix 5-7 days preop for surgery or procedures.      Relevant Medications   nitroGLYCERIN (NITROSTAT) 0.4 MG SL tablet   Complete heart block (HCC) (Chronic)    Complete Heart Block Seen by EP on 03/20/21 for routine f/u. Medtronic BiV PPM implanted in 06/2016 for complete heart block.  -F/u EP .Marland Kitchen  This is something I did not take into consideration when ordering event monitor.  We have the option of interrogating his pacemaker device as a monitor.      Relevant Medications   nitroGLYCERIN (NITROSTAT) 0.4 MG SL tablet     Other   Other chest pain - Primary    Chest Pain   CAD CP x 2 weeks with associated SOB. Patient has many risk factors given previous NSTEMI and CABG x4 (2021). At last visit, plan was to d/c ASA and continue single anti-platelet therapy with Plavix. Patient has continued on ASA and Plavix. Last  LHC in 2021. Examination notable for point-tenderness to left lateral rib. Appears more MSK in nature.  -Continue Losartan, carvedilol, Amlodipine  -Lasix PRN  -Continue Atorvastatin  -Stop ASA 81 mg  -Hold Plavix while taking anti-inflammatories  -Take Ibuprofen 800 mg BID x2 days, then 600 mg BID x2 days, then 400 mg BID x2 days, then 200 mg BID x2 days; can resume Plavix afterwards -Could consider chest CT for further evaluation if persists  -F/u in 1 month   I  personally discussed his symptoms with him and physically examined him.  He is discomfort is not consistent with his MI pain.  It is left lateral almost mid axillary line and I was able to find a area of point tenderness.  With this finding, I am less concerned about this being an anginal equivalent.  I think this is probably musculoskeletal and possibly costochondritis. I recommended alternating x-ray of Tylenol and an ibuprofen taper as noted above.  I do think that he should hold his Plavix while taking the higher doses of ibuprofen.  He can restart once he is on lower doses.  We are going to stop his aspirin regardless.  At this point I do not think we need to proceed with an ischemic evaluation.      Relevant Orders   EKG 12-Lead   LONG TERM MONITOR (3-14 DAYS)   Palpitations    Palpitations Reports about 3 episodes of palpitations each week, usually at night. Will need to evaluate for arrhythmia.  -Zio monitor x2 weeks   He has pacemaker in place and is a sensed V paced.  We could have used his pacemaker to interrogate to see if there is any arrhythmias.  If nothing is seen on event monitor, we can have the device interrogated to see if there were any arrhythmias.      Relevant Orders   EKG 12-Lead   LONG TERM MONITOR (3-14 DAYS)   OSA treated with BiPAP (Chronic)    OSA on BiPAP  Chronic and stable.   Encourage continued use of BiPAP.      S/P CABG x 4: LIMA-LAD, L rad-OM2-PDA, RIMA-OM1 (Chronic)    CABG x4  Plan for Myoview stress test in 2025.  -Continue Plavix  Very happy with the results of CABG.  No further angina.  Agree with plan for Myoview stress test in 2025 unless he has recurrent symptoms.  Current symptoms not consistent with angina and therefore we will hold off on ischemic evaluation with Myoview.      ===================================  HPI:    John Dodson is a 61 y.o. male with a PMH below who presents today for concerns of left-sided chest  pressure.  He is being seen today along with Dr. Nita Sells.  CARDIAC HISTORY: Long history prior to Transfer of Care to [facility]. At least 12 stents placed-noted in LAD, OM1 and OM 2 as well as at least 4 in the L PDA into the nondominant RCA =>  December 2020-COVID-19 infection => May 16, 2019-acute hypoxic restaurant failure, DKA found to have bilateral PE. Transferred Care - Initial visit 07/15/19 c/o retrosternal chest pain radiating to the jaw, off and on for a month.  He felt like he was not being "taken seriously" --> after initial plans for medical management were unsuccessful (Toprol plus Imdur) -> we decided to proceed with cardiac catheterization for definitive evaluation. Cath 08/20/2019: MV CAD -> patent stents in prox-mid LAD, prox OM1, prox LPA 1 and several stents in  L PDA as well as RCA.  Ostial LCx 60% (DFR 0.64), along mid LAD 60% post stent (DFR 0.67) => referred for CABG x4 (Dr. Mickie Hillier PDA,?  RIMA-RI/OM1). TTE 08/21/2019: EF 55 to 60%.  Normal WM.  GR 1 DD. .  Mild concentric LVH. Mildly elevated PAP -> mild-moderate TR.Marland Kitchen  Seen by Dr. Lovena Le (EP) for BiVPPM f/u 12/17/19 - established with [facility] EP Diuresis complicated by GOUT -> ER. 7/12/202 HTN -  11/19/2019 -> ER for edema, headache, nausea and elevated pressures with 9 pound weight gain => given IV Lasix  7/27-29/2021 -> admitted for hypertensive urgency (had not been taking losartan and Imdur) Cards c/s => increased Coreg to 12.5 mg BID, Losartan to 50 mg, amlodipine 5 mg & PRN 90 No Dr. Quay Burow vertigo this abscess: He has had he has Lasix (per Sliding Scale).  Also started on SGLT2-I (Jardiance).   BREEZE ANGELL was last seen on 06/06/2020.  He was doing fairly well with no major issues.  No complaints of angina since his bypass surgery.  Blood pressures were stable.  May be taking Lasix 2 times a week.  Blood pressures controlled.  No PND orthopnea or edema.  Was very happy  being free of chest pain after having had such a long episode of angina symptoms.  Even his musculoskeletal chest pain was improving.=> No medication adjustments made.  Plan was to stop aspirin as of April.  (However, he continued to take it-not having understood)  He was seen by Beryle Beams) [place] in EP office on 03/20/21, Noted that he was having some vertigo symptoms of dizziness.  His PCP had decreased his losartan dose.  He had suffered a fall and hit his chest on the size of some furniture, but this was mechanical and not related syncope or near-syncope.  PPM functioning well.  No changes.  Reviewed  CV studies:    The following studies were reviewed today: (if available, images/films reviewed: From Epic Chart or Care Everywhere) Echo 07/2019: EF 55-60%, grade I DD. Moderate LVH. Borderline elevated pulmonary artery systolic pressure. Trivial mitral regurgitation. Tricuspid regurgitation is mild to moderate.  LHC 07/2019: There is stent from ostial to almost distal small nondominant RCA: Ost RCA to Mid RCA stent is diffusely 35% stenosed. Ost Cx to Prox Cx lesion is 60% stenosed. Ost LAD to Mid LAD stent is 10% stenosed. Mid LAD to Dist LAD lesion is 60% stenosed. Previously placed 2nd Mrg stent (DES) is widely patent. Lat 2nd Mrg lesion is 80% stenosed. Previously described. Previously placed 1st LPL stent (DES) is widely patent. LPAV lesion is 50% stenosed. LPDA stent is 5% stenosed.  Interval History:   CARI VANDEBERG presents for urgent evaluation of chest pain and palpitations. For the last couple weeks, he has had some continuous left-sided chest pain. "Feels like something is sitting" on him. Holding his left chest helps take the pressure off. Feels SOB. Intermittent nausea in the morning and afternoon. The pain sometimes goes up the left-side of his neck and head.   Takes Tylenol for the pain. States "in a way" it seems to help and "in a way it don't".   Hasn't felt these pains  before. Does not work, he is on disability. Walks for exercise. This discomfort keeps him from walking. Pain occurs on exertion and at rest. Feels dizzy when he gets up. No recent falls.   Took Nitro last week. Helped a little.    Notes that he did  have a fall on concrete in November. He doesn't necessarily feel that his chest pain is related since it just came on a couple weeks ago. He does note the only exacerbating symptom is taking deep breaths. He denies any chest exercises or other injuries.   Up until the onset of this chest discomfort on, he has not had any of his anginal equivalent type pain.  He does point out this pain is not similar to his angina.  It is really left lateral chest up underneath his breast in the middle axillary line where he points.  Worse with exertion and deep breaths, but his anginal pain was much more central chest pain and a heaviness/tightness.  That pain took his breath away this pain makes him feel short of breath because he does not want take a deep breath.  He just cannot recall when he would have injured himself unless it was when he had his fall back in November.  He otherwise denies any PND, orthopnea or edema.  No rapid regular heartbeats palpitations.  No syncope or near syncope, TIA/amaurosis fugax or claudication.  Recent Hospitalizations: None  CV Review of Symptoms (Summary) Cardiovascular ROS: positive for - chest pain, palpitations, and shortness of breath  REVIEWED OF SYSTEMS   Review of Systems  Constitutional:  Positive for diaphoresis. Negative for fever.  Respiratory:  Negative for cough.   Cardiovascular:  Positive for chest pain and palpitations. Negative for orthopnea, claudication, leg swelling and PND.  Gastrointestinal:  Positive for constipation and nausea. Negative for abdominal pain and diarrhea.  Genitourinary:  Negative for dysuria.  Musculoskeletal:  Negative for falls.  Neurological:  Positive for dizziness. Negative for  headaches.   I have reviewed and (if needed) personally updated the patient's problem list, medications, allergies, past medical and surgical history, social and family history.   PAST MEDICAL HISTORY   Past Medical History:  Diagnosis Date   Blepharitis of both eyes    Chronic   Cataracts, both eyes    CKD (chronic kidney disease) stage 3, GFR 30-59 ml/min (HCC)    s/p R Nephrectomy/adrenalectomy & DM-Nephropathy (baseline creatinine 1.4-1.5)   COVID-19 virus infection 04/25/2019   Diabetic peripheral neuropathy associated with type 2 diabetes mellitus (HCC)    Essential hypertension    GERD without esophagitis    History of basal cell carcinoma (BCC) excision    History of complete heart block 06/2016   s/p BiV PPM (now followed by Dr. Merlyn Lot - EP)   History of non-ST elevation myocardial infarction (NSTEMI)    And several occasions of unstable angina   Hx of CABGx4 08/25/2019   (Dr. Briant Cedar, [facility]): LIMA-LAD, Seq Rad- OM2-LPDA, RIMA-? OM1 (called Ramus on report, but no ramus seen on cath).   Hyperlipidemia associated with type 2 diabetes mellitus ([place])    on atorvastatin 40 mg.   Multiple Vessel CAD -- s/p PCI of LAD, OM1, LPL1, LPDA & non-dominant RCA --> now S/P CABG x 4 60%   (No cath report prior to 2016) (stents in proxLAD, prox OM1 x 2, prox LPL1 & 3-4 in L PDA & ~2 in  Known non-dominant RCA;  07/2019: MV CAD - patent stents in p-m LAD, p OM1, p LPL & LPDA as well as RCA -> Ost LCx ~60-70% (DFR ++), long mLAD 60% post-stent (DFR ++) --> referred for CABG   Obesity (BMI 30.0-34.9)    Occlusion of left vertebral artery    Consider repossible acute lesion in August  2020 with presentation of headache.  CTA suggested occluded left vertebral artery throughout the neck.  Faint string-like enhancement intermittently visible suggesting recent occlusion.  Partial reconstruction and posterior fossa.  Left PICA patent.  (Consider possible left vertebral artery dissection)   OSA  treated with BiPAP 02/2019   Diagnosis in late 2020 ([facility])--> delayed onset of treatment with BiPAP due to Covid hospitalization followed by PE. ->  BiPAP setting at 21/17 cm   Pulmonary embolism ([place]) 05/18/2019   (1 month following COVID-19 infection): DVT-PE (bilateral PEs noted on VQ scan)-> started on warfarin.  (On warfarin plus Plavix now with aspirin stopped.)   Type 2 diabetes mellitus with complication, with long-term current use of insulin ([place])    On Lantus, Jardiance, Metformin & Onglyza    PAST SURGICAL HISTORY   Past Surgical History:  Procedure Laterality Date   BASAL CELL CARCINOMA EXCISION  01/2015   CAROTID DUPLEX SCAN  12/25/2018   [facility]: Mild plaque in both carotids.  139% bilateral.  Right vertebral flow normal antegrade, Left not seen; normal bilateral subclavian flow..   CHOLECYSTECTOMY     CORONARY ARTERY BYPASS GRAFT N/A 08/25/2019   Procedure: CORONARY ARTERY BYPASS GRAFTING (CABG) TIMES FOUR, ON PUMP, USING LEFT AND RIGHT INTERNAL MAMMARY ARTERY AND LEFT RADIAL ARTERY;  Surgeon: [name], MD;  Location: [place] OR;  Service: Open Heart Surgery;; LIMA-LAD, Seq Rad OM2-LPDA, RIMA-OM1   CORONARY STENT INTERVENTION  04/10/2015   ([place]; [place] Limbo, DO) -> DES PCI mLPDA: Xience Alpine DES 2.5 mm x 18 mm, Xience Alpine DES 2.25 mm x 12 mm overlapping.   CORONARY STENT INTERVENTION  04/04/2016   ([place]; [place], MD)--> (urgent) 100% mLPDA PTCA with 2.25 mm balloon - reduced to 50%; mid OM2 90% - DES PCI (Xience DES  2.5 x 18).    CORONARY STENT INTERVENTION  2015   Prior to December 2016, STENTS noted in prox LAD, proximal OM1, and at least 2 stents in proximal L PDA   CORONARY STENT INTERVENTION  11/17/2018   ([place]; Bishop Limbo, DO): CULPRIT: mid RCA 90% (DES PCI) -Resolute Onyx DES 2.5 mm x 30 mm --> COMPLICATION: Large left arm hematoma-evaluated by vascular and orthopedic  surgery, managed with splint and arm elevation.   CORONARY STENT INTERVENTION  12/28/2018   ([place]; Clarene Critchley, MD, referred by Dr. Mathis Bud) --> INDICATION (urgent, Unstable Angina): Moderate-severe (70%) stenosis of ostial RCA  -> DES PCI Resolute Onyx DES 2.5 mm x 12 mm.    LEFT HEART CATH AND CORONARY ANGIOGRAPHY  04/10/2015   ([place]; Bishop Limbo, DO)--> EF 55%.  Mild inferior HK.  Coronaries-LM: Normal, p LAD STENT 30% ISR, mLAD 20% & diffuse dLAD ~20%; Dom LCx: mCx 20%, ~pOM1 STENT (noted as OM2 in other reports) patent with mid 30%, OM2 normal, prox LPDA "STENTS" patent with SEVERE mid L PDA 90-95% (DES PCI x 2 overlapping); Small-non-dominant RCA  patent    LEFT HEART CATH AND CORONARY ANGIOGRAPHY  04/04/2016   ([place]; [place], MD)--> (urgent): EF 70%.  Previous LAD,~OM2 and proximal PDA stents patent; -> mLAD 35%, dLAD 40%; LCx-OM1 75% (short lesion, med Rx), mid OM2 90% (DES PCI), pLPDA stents patent w/ mPDA 100% (PTCA only - reduced to 50%); non-dom RCA - ost RCA 60% & mRCA 75%   LEFT HEART CATH AND CORONARY ANGIOGRAPHY  11/17/2018   ([place]; Bishop Limbo,  DO) indication: Angina.  LM normal; LAD - ost LAD ~50%, pLAD STENT ~30% ISR with dLAD ~40%; Dom LCx - ost & prox Cx 30%, OM1 STENT 20% ISR, OM2 STENT 50% distal edge, pLPDA overlapping STENTS ~20%; nonDom RCA - proxRCA 40%, mid 90% (DES PCI)   LEFT HEART CATH AND CORONARY ANGIOGRAPHY  12/28/2018   ([place]; Clarene Critchley, MD, referred by Dr. Jeneen Rinks [place]) --> INDICATION (urgent, Unstable Angina): Moderate-severe (70%) stenosis of ostial RCA (DES PCI), mRCA 30%.  Otherwise no significant change from July 2020: dLM 20%, pLAD ~40% ISR , dLAD long/diffuse ~50%; OstLCx 30%, OM1 ~15% OM2 ~30% with patent  LPDA stents/PTCA site.    LEFT HEART CATH AND CORONARY ANGIOGRAPHY N/A 08/20/2019   Procedure: LEFT HEART CATH AND CORONARY ANGIOGRAPHY;  Surgeon:  Leonie Man, MD;  Location: [place] [facility];  Service: Cardiovascular;; Multivessel CAD: Patent stents in proximal to mid LAD, proximal OM1, proximal LPL 1 and several stents in L PDA as well as RCA (nondominant).  Ostial LCx 60%-highly DFR +0.84.  There is a long mid LAD 60% stenosis after the stent--highly DFR +0.67.--> CABG   NEPHRECTOMY Right 2012   With adrenalectomy   NM MYOVIEW LTD  08/20/2018   [facility] -> Lexiscan Myoview: EF 81%.  No reversible ischemia or infarction.  Normal wall motion.   PACEMAKER IMPLANT  06/2016   [facility] [place] ([employer]tr[employer]) -> according to CT of chest, leads positioned in right atrium, cardiac apex and coronary sinus (suggesting CRT-P - BiV Pacing))   RADIAL ARTERY HARVEST Left 08/25/2019   Procedure: RADIAL ARTERY HARVEST;  Surgeon: [name], MD;  Location: [place];  Service: Open Heart Surgery;  Laterality: Left;   TEE WITHOUT CARDIOVERSION N/A 08/25/2019   Procedure: TRANSESOPHAGEAL ECHOCARDIOGRAM (TEE);  Surgeon: [name], MD;  Location: [place];  Service: Open Heart Surgery;  Laterality: N/A;   TRANSTHORACIC ECHOCARDIOGRAM  11/2018; 05/01/2019   ([place]) a) EF 60-65%.  Mild TR.;; b)moderate concentric LVH.  EF 55 to 60%.  No R WMA.  Normal RV size and function.  Normal atrial sizes.  Normal valves.    Immunization History  Administered Date(s) Administered   PFIZER(Purple Top)SARS-COV-2 Vaccination 10/06/2019, 10/28/2019    MEDICATIONS/ALLERGIES   Current Meds  Medication Sig   albuterol (VENTOLIN HFA) 108 (90 Base) MCG/ACT inhaler Inhale 2 puffs into the lungs every 4 (four) hours as needed for wheezing or shortness of breath.   allopurinol (ZYLOPRIM) 300 MG tablet Take 300 mg by mouth daily.   amLODipine (NORVASC) 5 MG tablet Take 1 tablet (5 mg total) by mouth daily.   atorvastatin (LIPITOR) 40 MG tablet Take 1 tablet (40 mg total) by mouth daily.   calcium-vitamin D (OSCAL WITH D)  500-200 MG-UNIT tablet Take 1 tablet by mouth daily. 50 MCG (2000UNIT ) TAB CHOLECALCIFEROL   carvedilol (COREG) 12.5 MG tablet Take 1 tablet (12.5 mg total) by mouth 2 (two) times daily with a meal.   clopidogrel (PLAVIX) 75 MG tablet Take 1 tablet (75 mg total) by mouth daily. Please keep scheduled appointment in Nov for further refills.   colchicine 0.6 MG tablet Take 0.6 mg by mouth as needed (gout flare ups).   fluticasone (FLONASE) 50 MCG/ACT nasal spray Place 2 sprays into both nostrils as needed for congestion.   furosemide (LASIX) 40 MG tablet Take 1 tablet (40 mg total) by mouth daily as needed for fluid or edema (Poor  more than 3 pound weight gain overnight or more than 5 pound weight gain over baseline).   insulin glargine (LANTUS SOLOSTAR) 100 UNIT/ML Solostar Pen Inject 50 Units into the skin every evening. 100UNITS/3 ML   JARDIANCE 25 MG TABS tablet Take 25 mg by mouth daily.   losartan (COZAAR) 25 MG tablet Take 12.5 mg by mouth daily.   MAGNESIUM OXIDE PO Take 500 mg by mouth daily. TAKE 1  TABLET BY MOUTH NIGHTLY FOR LEG CRAMPS   Omega-3 Fatty Acids (FISH OIL BURP-LESS) 1000 MG CAPS Take 1,000 mg by mouth 2 (two) times daily.   OZEMPIC, 0.25 OR 0.5 MG/DOSE, 2 MG/1.5ML SOPN Inject 0.5 mg into the skin once a week.   pantoprazole (PROTONIX) 40 MG tablet Take 40 mg by mouth 2 (two) times daily before a meal.    traMADol (ULTRAM) 50 MG tablet Take 50 mg by mouth every 6 (six) hours as needed for pain.   [DISCONTINUED] aspirin EC 81 MG EC tablet Take 1 tablet (81 mg total) by mouth daily.   [DISCONTINUED] nitroGLYCERIN (NITROSTAT) 0.4 MG SL tablet Place 1 tablet (0.4 mg total) under the tongue every 5 (five) minutes as needed for chest pain.    Allergies  Allergen Reactions   Latex Rash   Codeine Nausea And Vomiting   Contrast Media [Iodinated Contrast Media]     Reportedly cardiac arrest   Integrilin [Eptifibatide]     Reportedly had shortness of breath, confusion.   Tylenol  [Acetaminophen]     Tylenol -3 with codiene   Zithromax [Azithromycin] Nausea And Vomiting   Glipizide Rash    Headache    SOCIAL HISTORY/FAMILY HISTORY   Reviewed in Epic:  Pertinent findings:  Social History   Tobacco Use   Smoking status: Never   Smokeless tobacco: Never  Vaping Use   Vaping Use: Never used  Substance Use Topics   Alcohol use: No   Drug use: No   Social History   Social History Narrative   Not on file    OBJCTIVE -PE, EKG, labs   Wt Readings from Last 3 Encounters:  05/16/21 210 lb 6.4 oz (95.4 kg)  03/20/21 209 lb (94.8 kg)  06/06/20 208 lb 3.2 oz (94.4 kg)    Physical Exam: BP 128/80    Pulse 84    Ht 5\' 8"  (1.727 m)    Wt 210 lb 6.4 oz (95.4 kg)    SpO2 98%    BMI 31.99 kg/m  Physical Exam Constitutional:      General: He is not in acute distress.    Appearance: Normal appearance. He is not diaphoretic.  HENT:     Nose: Nose normal.  Eyes:     Conjunctiva/sclera: Conjunctivae normal.  Cardiovascular:     Rate and Rhythm: Normal rate and regular rhythm.     Heart sounds: No murmur heard. Pulmonary:     Effort: Pulmonary effort is normal. No respiratory distress.     Breath sounds: Normal breath sounds.  Musculoskeletal:        General: Normal range of motion.     Cervical back: Neck supple.     Comments: Reproducible chest tenderness to left lateral rib. Without crepitus.   Skin:    General: Skin is warm and dry.     Capillary Refill: Capillary refill takes less than 2 seconds.  Neurological:     Mental Status: He is alert. Mental status is at baseline.  Psychiatric:  Mood and Affect: Mood normal.   --> On my exam, he had exquisite point tenderness along the left lateral rib or I was able to touch with 1 finger and he flinched indicating this is the exact location of his pain.  It appears to be more of the lower ribs perhaps at the insertion onto the cartilage.  Bracing this area with my hand helps eliminate the exacerbation  of pain with inspiration.  The remainder the exam was benign.  He does have a split S2 related to pacing beats.   Adult ECG Report  Rate: 79 ;  Rhythm: BiV paced rhythm     Narrative Interpretation: BiV paced rhythm   Recent Labs:    Lab Results  Component Value Date   CHOL 122 08/24/2019   HDL 29 (L) 08/24/2019   LDLCALC 64 08/24/2019   TRIG 78 08/25/2019   CHOLHDL 4.2 08/24/2019   Lab Results  Component Value Date   CREATININE 1.55 (H) 11/25/2019   BUN 19 11/25/2019   NA 141 11/25/2019   K 4.3 11/25/2019   CL 103 11/25/2019   CO2 27 11/25/2019   CBC Latest Ref Rng & Units 11/24/2019 11/23/2019 08/30/2019  WBC 4.0 - 10.5 K/uL 8.4 7.6 8.8  Hemoglobin 13.0 - 17.0 g/dL 11.8(L) 10.8(L) 9.4(L)  Hematocrit 39.0 - 52.0 % 38.7(L) 37.1(L) 30.1(L)  Platelets 150 - 400 K/uL 244 214 153    Lab Results  Component Value Date   HGBA1C 7.8 (H) 11/24/2019   No results found for: TSH  ==================================================  COVID-19 Education: The signs and symptoms of COVID-19 were discussed with the patient and how to seek care for testing (follow up with PCP or arrange E-visit).    I spent a total of 10 minutes with the patient spent in direct patient consultation. ->  Attending time with patient 10 minutes Additional time spent with chart review  / charting (studies, outside notes, etc): 10 min -> attending time and charting 15 minutes. Total Time: 45 min   This visit occurred during the SARS-CoV-2 public health emergency.  Safety protocols were in place, including screening questions prior to the visit, additional usage of staff PPE, and extensive cleaning of exam room while observing appropriate contact time as indicated for disinfecting solutions.  Notice: This dictation was prepared with Dragon dictation along with smart phrase technology. Any transcriptional errors that result from this process are unintentional and may not be corrected upon review.  Studies  Ordered:   Orders Placed This Encounter  Procedures   LONG TERM MONITOR (3-14 DAYS)   EKG 12-Lead    Patient Instructions / Medication Changes & Studies & Tests Ordered   Patient Instructions  Medication Instructions:   STOP ASPIRIN   DO NOT TAKE  CLOPIDOGREL  WHILE USING IBUPROFEN -  TAKE  800 MG  ( 4 TABLETS OF 200 MG ) TWICE A DAY X 2 DAYS, TAKE 600 MG  ( 3 TABLETS OF 200 MG ) TWICE A DAY  X 2 DAYS TAKE  400 MG  ( 2 TABLETS OF 200 MG)  TWICE A  DAY X 2 DAYS TAKE 200 MG  ( 1 TABLET OF 200 MG ) TWICE A DAY X 2 DAYS  THEN  RESTART TAKING CLOPIDOGREL   *If you need a refill on your cardiac medications before your next appointment, please call your pharmacy*   Lab Work: NOT NEEDED    Testing/Procedures:  Your physician has recommended that you wear a holter monitor 14  DAY zIO. Holter monitors are medical devices that record the hearts electrical activity. Doctors most often use these monitors to diagnose arrhythmias. Arrhythmias are problems with the speed or rhythm of the heartbeat. The monitor is a small, portable device. You can wear one while you do your normal daily activities. This is usually used to diagnose what is causing palpitations/syncope (passing out).    Follow-Up: At [facility], you and your health needs are our priority.  As part of our continuing mission to provide you with exceptional heart care, we have created designated Provider Care Teams.  These Care Teams include your primary Cardiologist (physician) and Advanced Practice Providers (APPs -  Physician Assistants and Nurse Practitioners) who all work together to provide you with the care you need, when you need it.     Your next appointment:   1 month(s)  The format for your next appointment:   In Person  Provider:   Glenetta Hew, MD    Other Instructions   ZIO XT- Long Term Monitor Instructions  Your physician has requested you wear a ZIO patch monitor for 14 days.  This is a single  patch monitor. [employer] supplies one patch monitor per enrollment.     Signed.  Sharion Settler PGY-2 Family Medicine       ATTENDING ATTESTATION  I have seen, examined and evaluated the patient along with the Resident Physician in clinic today.  I personally performed my own interview & exanimation.  After reviewing all the available data and chart, we discussed the patients laboratory, study & physical findings as well as symptoms in detail. I agree with her findings, examination as well as impression recommendations as per our discussion.    Attending adjustments int the full clinic noted annotated in [place].    Presented with chest pain and palpitations.  Chest pain seems to be mostly musculoskeletal as it is reproducible on exam.  Palpitations are also somewhat concerning.  We have ordered a Zio patch monitor, if nothing shows on this, we do have the ability to go back and interrogate his pacemaker.  See full assessment plan above.    Glenetta Hew, M.D., M.S. Interventional Cardiologist   Pager # [phone] Phone # [phone] [address] [place], [place] [address]   Thank you for choosing [facility] at [facility]!!

## 2021-05-16 ENCOUNTER — Other Ambulatory Visit: Payer: Self-pay

## 2021-05-16 ENCOUNTER — Encounter: Payer: Self-pay | Admitting: Cardiology

## 2021-05-16 ENCOUNTER — Ambulatory Visit: Payer: Medicare Other | Admitting: Cardiology

## 2021-05-16 VITALS — BP 128/80 | HR 84 | Ht 68.0 in | Wt 210.4 lb

## 2021-05-16 DIAGNOSIS — I2699 Other pulmonary embolism without acute cor pulmonale: Secondary | ICD-10-CM

## 2021-05-16 DIAGNOSIS — R002 Palpitations: Secondary | ICD-10-CM | POA: Diagnosis not present

## 2021-05-16 DIAGNOSIS — Z9861 Coronary angioplasty status: Secondary | ICD-10-CM

## 2021-05-16 DIAGNOSIS — R0789 Other chest pain: Secondary | ICD-10-CM

## 2021-05-16 DIAGNOSIS — E785 Hyperlipidemia, unspecified: Secondary | ICD-10-CM

## 2021-05-16 DIAGNOSIS — E1169 Type 2 diabetes mellitus with other specified complication: Secondary | ICD-10-CM

## 2021-05-16 DIAGNOSIS — Z7901 Long term (current) use of anticoagulants: Secondary | ICD-10-CM

## 2021-05-16 DIAGNOSIS — I10 Essential (primary) hypertension: Secondary | ICD-10-CM

## 2021-05-16 DIAGNOSIS — I214 Non-ST elevation (NSTEMI) myocardial infarction: Secondary | ICD-10-CM

## 2021-05-16 DIAGNOSIS — I442 Atrioventricular block, complete: Secondary | ICD-10-CM | POA: Diagnosis not present

## 2021-05-16 DIAGNOSIS — G4733 Obstructive sleep apnea (adult) (pediatric): Secondary | ICD-10-CM

## 2021-05-16 DIAGNOSIS — I2510 Atherosclerotic heart disease of native coronary artery without angina pectoris: Secondary | ICD-10-CM | POA: Diagnosis not present

## 2021-05-16 DIAGNOSIS — Z951 Presence of aortocoronary bypass graft: Secondary | ICD-10-CM

## 2021-05-16 MED ORDER — NITROGLYCERIN 0.4 MG SL SUBL: 0.4000 mg | SUBLINGUAL_TABLET | SUBLINGUAL | 6 refills | Status: DC | PRN

## 2021-05-16 NOTE — Patient Instructions (Addendum)
Medication Instructions:   STOP ASPIRIN   DO NOT TAKE  CLOPIDOGREL  WHILE USING IBUPROFEN -  TAKE  800 MG  ( 4 TABLETS OF 200 MG ) TWICE A DAY X 2 DAYS, TAKE 600 MG  ( 3 TABLETS OF 200 MG ) TWICE A DAY  X 2 DAYS TAKE  400 MG  ( 2 TABLETS OF 200 MG)  TWICE A  DAY X 2 DAYS TAKE 200 MG  ( 1 TABLET OF 200 MG ) TWICE A DAY X 2 DAYS  THEN  RESTART TAKING CLOPIDOGREL   *If you need a refill on your cardiac medications before your next appointment, please call your pharmacy*   Lab Work: NOT NEEDED    Testing/Procedures:  Your physician has recommended that you wear a holter monitor 14 DAY zIO. Holter monitors are medical devices that record the hearts electrical activity. Doctors most often use these monitors to diagnose arrhythmias. Arrhythmias are problems with the speed or rhythm of the heartbeat. The monitor is a small, portable device. You can wear one while you do your normal daily activities. This is usually used to diagnose what is causing palpitations/syncope (passing out).    Follow-Up: At [facility], you and your health needs are our priority.  As part of our continuing mission to provide you with exceptional heart care, we have created designated Provider Care Teams.  These Care Teams include your primary Cardiologist (physician) and Advanced Practice Providers (APPs -  Physician Assistants and Nurse Practitioners) who all work together to provide you with the care you need, when you need it.     Your next appointment:   1 month(s)  The format for your next appointment:   In Person  Provider:   Glenetta Hew, MD    Other Instructions   ZIO XT- Long Term Monitor Instructions  Your physician has requested you wear a ZIO patch monitor for 14 days.  This is a single patch monitor. [employer] supplies one patch monitor per enrollment. Additional stickers are not available. Please do not apply patch if you will be having a Nuclear Stress Test,  Echocardiogram, Cardiac CT,  MRI, or Chest Xray during the period you would be wearing the  monitor. The patch cannot be worn during these tests. You cannot remove and re-apply the  ZIO XT patch monitor.  Your ZIO patch monitor will be mailed 3 day USPS to your address on file. It may take 3-5 days  to receive your monitor after you have been enrolled.  Once you have received your monitor, please review the enclosed instructions. Your monitor  has already been registered assigning a specific monitor serial # to you.  Billing and Patient Assistance Program Information  We have supplied [employer] with any of your insurance information on file for billing purposes. [employer] offers a sliding scale Patient Assistance Program for patients that do not have  insurance, or whose insurance does not completely cover the cost of the ZIO monitor.  You must apply for the Patient Assistance Program to qualify for this discounted rate.  To apply, please call [employer] at [phone], select option 4, select option 2, ask to apply for  Patient Assistance Program. Theodore Demark will ask your household income, and how many people  are in your household. They will quote your out-of-pocket cost based on that information.  [employer] will also be able to set up a 80-month, interest-free payment plan if needed.  Applying the monitor   Shave hair from upper left  chest.  Hold abrader disc by orange tab. Rub abrader in 40 strokes over the upper left chest as  indicated in your monitor instructions.  Clean area with 4 enclosed alcohol pads. Let dry.  Apply patch as indicated in monitor instructions. Patch will be placed under collarbone on left  side of chest with arrow pointing upward.  Rub patch adhesive wings for 2 minutes. Remove white label marked "1". Remove the white  label marked "2". Rub patch adhesive wings for 2 additional minutes.  While looking in a mirror, press and release button in center of patch. A small green light will  flash 3-4  times. This will be your only indicator that the monitor has been turned on.  Do not shower for the first 24 hours. You may shower after the first 24 hours.  Press the button if you feel a symptom. You will hear a small click. Record Date, Time and  Symptom in the Patient Logbook.  When you are ready to remove the patch, follow instructions on the last 2 pages of Patient  Logbook. Stick patch monitor onto the last page of Patient Logbook.  Place Patient Logbook in the blue and white box. Use locking tab on box and tape box closed  securely. The blue and white box has prepaid postage on it. Please place it in the mailbox as  soon as possible. Your physician should have your test results approximately 7 days after the  monitor has been mailed back to [facility].  Call [facility] at [phone] if you have questions regarding  your ZIO XT patch monitor. Call them immediately if you see an orange light blinking on your  monitor.  If your monitor falls off in less than 4 days, contact our Monitor department at [phone].  If your monitor becomes loose or falls off after 4 days call [employer] at [phone] for  suggestions on securing your monitor

## 2021-05-17 ENCOUNTER — Ambulatory Visit (INDEPENDENT_AMBULATORY_CARE_PROVIDER_SITE_OTHER): Payer: Medicare Other

## 2021-05-17 DIAGNOSIS — R002 Palpitations: Secondary | ICD-10-CM

## 2021-05-17 DIAGNOSIS — R0789 Other chest pain: Secondary | ICD-10-CM

## 2021-05-17 NOTE — Assessment & Plan Note (Addendum)
History of multiple PCI to the LAD and LCx followed by CABG x4 (Dr. Mickie Hillier PDA,?  RIMA-RI/OM1)..  He does have significant upstream stents and there could be upstream microvascular disease.  This was following his heart catheterization back in April 2021.  CAD CP x 2 weeks with associated SOB -> on further investigation, this seems to be more musculoskeletal in nature with point tenderness along the left lateral rib.   Patient has continued on ASA and Plavix. Last LHC in 2021.  At last visit, plan was to d/c ASA and continue single anti-platelet therapy with Plavix.-->  Recommended that he stop ASA 81 mg  -Continue carvedilol, amlodipine -as antianginals, along with Losartan for afterload reduction. -Lasix PRN  -Continue Atorvastatin  -Continue Jardiance  (Hold Plavix while taking anti-inflammatories )  Okay to hold Plavix 5-7 days preop for surgery or procedures.

## 2021-05-17 NOTE — Assessment & Plan Note (Signed)
Chest Pain   CAD CP x 2 weeks with associated SOB. Patient has many risk factors given previous NSTEMI and CABG x4 (2021). At last visit, plan was to d/c ASA and continue single anti-platelet therapy with Plavix. Patient has continued on ASA and Plavix. Last LHC in 2021. Examination notable for point-tenderness to left lateral rib. Appears more MSK in nature.  -Continue Losartan, carvedilol, Amlodipine  -Lasix PRN  -Continue Atorvastatin  -Stop ASA 81 mg  -Hold Plavix while taking anti-inflammatories  -Take Ibuprofen 800 mg BID x2 days, then 600 mg BID x2 days, then 400 mg BID x2 days, then 200 mg BID x2 days; can resume Plavix afterwards -Could consider chest CT for further evaluation if persists  -F/u in 1 month   I personally discussed his symptoms with him and physically examined him.  He is discomfort is not consistent with his MI pain.  It is left lateral almost mid axillary line and I was able to find a area of point tenderness.  With this finding, I am less concerned about this being an anginal equivalent.  I think this is probably musculoskeletal and possibly costochondritis. I recommended alternating x-ray of Tylenol and an ibuprofen taper as noted above.  I do think that he should hold his Plavix while taking the higher doses of ibuprofen.  He can restart once he is on lower doses.  We are going to stop his aspirin regardless.  At this point I do not think we need to proceed with an ischemic evaluation.

## 2021-05-17 NOTE — Progress Notes (Unsigned)
Enrolled for [employer] to mail a ZIO XT long term holter monitor to the patients address on file.  

## 2021-05-17 NOTE — Assessment & Plan Note (Signed)
OSA on BiPAP  Chronic and stable.   Encourage continued use of BiPAP.

## 2021-05-17 NOTE — Assessment & Plan Note (Signed)
HLD associated with T2DM  Last lipid panel 08/2020 with LDL 62, at goal.  -Continue Atorvastatin 40 mg daily   Last A1c 02/2021 6.6, at goal. Excellent management by PCP. On Jardiance, Ozempic and Lantus.  -per PCP

## 2021-05-17 NOTE — Assessment & Plan Note (Addendum)
Complete Heart Block Seen by EP on 03/20/21 for routine f/u. Medtronic BiV PPM implanted in 06/2016 for complete heart block.  -F/u EP .Marland Kitchen  This is something I did not take into consideration when ordering event monitor.  We have the option of interrogating his pacemaker device as a monitor.

## 2021-05-17 NOTE — Assessment & Plan Note (Signed)
Essential HTN:  BP today normotensive.  -Continue Amlodipine 5 mg -Continue Carvedilol 12.5 mg -Continue Losartan 25 mg    Stable blood pressure.  No change.

## 2021-05-17 NOTE — Assessment & Plan Note (Addendum)
Distant history of non-STEMI, dating back to at least 2020.   However presented with progressive angina and underwent cardiac catheterization in April 2021.  Found to have multivessel disease and referred for CABG.  Has not had any further anginal symptoms since CABG.  Echo had preserved EF with no R WMA to suggest large infarct.Marland Kitchen

## 2021-05-17 NOTE — Assessment & Plan Note (Signed)
No longer on warfarin.  Will maintain on Plavix long-term.

## 2021-05-17 NOTE — Progress Notes (Signed)
ATTENDING ATTESTATION  I have seen, examined and evaluated the patient along with the Resident Physician in clinic today.  I personally performed my own interview & exanimation.  After reviewing all the available data and chart, we discussed the patients laboratory, study & physical findings as well as symptoms in detail. I agree with her findings, examination as well as impression recommendations as per our discussion.    Attending adjustments int the full clinic noted annotated in [place].    Other chest pain Chest Pain   CAD CP x 2 weeks with associated SOB. Patient has many risk factors given previous NSTEMI and CABG x4 (2021). At last visit, plan was to d/c ASA and continue single anti-platelet therapy with Plavix. Patient has continued on ASA and Plavix. Last LHC in 2021. Examination notable for point-tenderness to left lateral rib. Appears more MSK in nature.  -Continue Losartan, carvedilol, Amlodipine  -Lasix PRN  -Continue Atorvastatin  -Stop ASA 81 mg  -Hold Plavix while taking anti-inflammatories  -Take Ibuprofen 800 mg BID x2 days, then 600 mg BID x2 days, then 400 mg BID x2 days, then 200 mg BID x2 days; can resume Plavix afterwards -Could consider chest CT for further evaluation if persists  -F/u in 1 month   I personally discussed his symptoms with him and physically examined him.  He is discomfort is not consistent with his MI pain.  It is left lateral almost mid axillary line and I was able to find a area of point tenderness.  With this finding, I am less concerned about this being an anginal equivalent.  I think this is probably musculoskeletal and possibly costochondritis. I recommended alternating x-ray of Tylenol and an ibuprofen taper as noted above.  I do think that he should hold his Plavix while taking the higher doses of ibuprofen.  He can restart once he is on lower doses.  We are going to stop his aspirin regardless.  At this point I do not think we need to  proceed with an ischemic evaluation.  CAD S/P PCI; no further angina History of multiple PCI to the LAD and LCx followed by CABG x4 (Dr. Mickie Hillier PDA,?  RIMA-RI/OM1)..  He does have significant upstream stents and there could be upstream microvascular disease.  This was following his heart catheterization back in April 2021.  CAD CP x 2 weeks with associated SOB -> on further investigation, this seems to be more musculoskeletal in nature with point tenderness along the left lateral rib.  Patient has continued on ASA and Plavix. Last LHC in 2021. At last visit, plan was to d/c ASA and continue single anti-platelet therapy with Plavix.-->  Recommended that he stop ASA 81 mg  -Continue carvedilol, amlodipine -as antianginals, along with Losartan for afterload reduction. -Lasix PRN  -Continue Atorvastatin  -Continue Jardiance  (Hold Plavix while taking anti-inflammatories ) Okay to hold Plavix 5-7 days preop for surgery or procedures.  Complete heart block (HCC) Complete Heart Block Seen by EP on 03/20/21 for routine f/u. Medtronic BiV PPM implanted in 06/2016 for complete heart block.  -F/u EP .Marland Kitchen  This is something I did not take into consideration when ordering event monitor.  We have the option of interrogating his pacemaker device as a monitor.  Palpitations Palpitations Reports about 3 episodes of palpitations each week, usually at night. Will need to evaluate for arrhythmia.  -Zio monitor x2 weeks   He has pacemaker in place and is a sensed V paced.  We  could have used his pacemaker to interrogate to see if there is any arrhythmias.  If nothing is seen on event monitor, we can have the device interrogated to see if there were any arrhythmias.  Essential hypertension Essential HTN:  BP today normotensive.  -Continue Amlodipine 5 mg -Continue Carvedilol 12.5 mg -Continue Losartan 25 mg    Stable blood pressure.  No change.  Pulmonary embolism on long-term  anticoagulation therapy ([place]) No longer on warfarin.  Will maintain on Plavix long-term.  OSA treated with BiPAP OSA on BiPAP  Chronic and stable.   Encourage continued use of BiPAP.  Hyperlipidemia associated with type 2 diabetes mellitus ([place]) HLD associated with T2DM  Last lipid panel 08/2020 with LDL 62, at goal.  -Continue Atorvastatin 40 mg daily   Last A1c 02/2021 6.6, at goal. Excellent management by PCP. On Jardiance, Ozempic and Lantus.  -per PCP   Non-STEMI (non-ST elevated myocardial infarction) ([place]) Distant history of non-STEMI, dating back to at least 2020.   However presented with progressive angina and underwent cardiac catheterization in April 2021.  Found to have multivessel disease and referred for CABG.  Has not had any further anginal symptoms since CABG.  Echo had preserved EF with no R WMA to suggest large infarct..  S/P CABG x 4: LIMA-LAD, L rad-OM2-PDA, RIMA-OM1 CABG x4  Plan for Myoview stress test in 2025.  -Continue Plavix  Very happy with the results of CABG.  No further angina.  Agree with plan for Myoview stress test in 2025 unless he has recurrent symptoms.  Current symptoms not consistent with angina and therefore we will hold off on ischemic evaluation with Myoview.   Plan: Ibuprofen taper for MSK chest pain-hold Plavix while on ibuprofen DC aspirin-continue monotherapy with Plavix going forward.  Okay to hold for procedures. Zio patch monitor and possible PPM interrogation to evaluate palpitations.    Glenetta Hew, M.D., M.S. Interventional Cardiologist   Pager # [phone] Phone # [phone] [address] [place], [place] [address]

## 2021-05-17 NOTE — Assessment & Plan Note (Addendum)
CABG x4  Plan for Myoview stress test in 2025.  -Continue Plavix  Very happy with the results of CABG.  No further angina.  Agree with plan for Myoview stress test in 2025 unless he has recurrent symptoms.  Current symptoms not consistent with angina and therefore we will hold off on ischemic evaluation with Myoview.

## 2021-05-17 NOTE — Assessment & Plan Note (Signed)
Palpitations Reports about 3 episodes of palpitations each week, usually at night. Will need to evaluate for arrhythmia.  -Zio monitor x2 weeks   He has pacemaker in place and is a sensed V paced.  We could have used his pacemaker to interrogate to see if there is any arrhythmias.  If nothing is seen on event monitor, we can have the device interrogated to see if there were any arrhythmias.

## 2021-05-21 DIAGNOSIS — R002 Palpitations: Secondary | ICD-10-CM

## 2021-05-21 DIAGNOSIS — R0789 Other chest pain: Secondary | ICD-10-CM

## 2021-06-15 ENCOUNTER — Ambulatory Visit (INDEPENDENT_AMBULATORY_CARE_PROVIDER_SITE_OTHER): Payer: Medicare Other

## 2021-06-15 DIAGNOSIS — I442 Atrioventricular block, complete: Secondary | ICD-10-CM

## 2021-06-15 LAB — CUP PACEART REMOTE DEVICE CHECK
Battery Remaining Longevity: 37 mo
Battery Voltage: 2.93 V
Brady Statistic AP VP Percent: 0.21 %
Brady Statistic AP VS Percent: 0.01 %
Brady Statistic AS VP Percent: 99.77 %
Brady Statistic AS VS Percent: 0 %
Brady Statistic RA Percent Paced: 0.22 %
Brady Statistic RV Percent Paced: 99.98 %
Date Time Interrogation Session: 20230217011535
Implantable Lead Implant Date: 20180315
Implantable Lead Implant Date: 20180315
Implantable Lead Implant Date: 20180315
Implantable Lead Location: 753858
Implantable Lead Location: 753859
Implantable Lead Location: 753860
Implantable Lead Model: 4076
Implantable Lead Model: 4298
Implantable Lead Model: 5076
Implantable Pulse Generator Implant Date: 20180315
Lead Channel Impedance Value: 285 Ohm
Lead Channel Impedance Value: 304 Ohm
Lead Channel Impedance Value: 361 Ohm
Lead Channel Impedance Value: 361 Ohm
Lead Channel Impedance Value: 399 Ohm
Lead Channel Impedance Value: 418 Ohm
Lead Channel Impedance Value: 437 Ohm
Lead Channel Impedance Value: 437 Ohm
Lead Channel Impedance Value: 475 Ohm
Lead Channel Impedance Value: 684 Ohm
Lead Channel Impedance Value: 684 Ohm
Lead Channel Impedance Value: 722 Ohm
Lead Channel Impedance Value: 722 Ohm
Lead Channel Impedance Value: 798 Ohm
Lead Channel Pacing Threshold Amplitude: 1 V
Lead Channel Pacing Threshold Amplitude: 1.125 V
Lead Channel Pacing Threshold Amplitude: 1.5 V
Lead Channel Pacing Threshold Pulse Width: 0.4 ms
Lead Channel Pacing Threshold Pulse Width: 0.4 ms
Lead Channel Pacing Threshold Pulse Width: 0.9 ms
Lead Channel Sensing Intrinsic Amplitude: 1.375 mV
Lead Channel Sensing Intrinsic Amplitude: 1.375 mV
Lead Channel Sensing Intrinsic Amplitude: 19.75 mV
Lead Channel Sensing Intrinsic Amplitude: 19.75 mV
Lead Channel Setting Pacing Amplitude: 2.5 V
Lead Channel Setting Pacing Amplitude: 2.75 V
Lead Channel Setting Pacing Amplitude: 3.25 V
Lead Channel Setting Pacing Pulse Width: 0.4 ms
Lead Channel Setting Pacing Pulse Width: 0.9 ms
Lead Channel Setting Sensing Sensitivity: 4 mV

## 2021-06-20 ENCOUNTER — Ambulatory Visit: Payer: Medicare Other | Admitting: Cardiology

## 2021-06-20 NOTE — Progress Notes (Signed)
Remote pacemaker transmission.   

## 2021-07-27 NOTE — Progress Notes (Signed)
? ?Cardiology Clinic Note  ? ?Patient Name: John Dodson ?Date of Encounter: 07/31/2021 ? ?Primary Care Provider:  Kristopher Glee., MD ?Primary Cardiologist:  Glenetta Hew, MD ? ?Patient Profile  ?  ?John Dodson 61 year old male presents to the clinic today for follow-up evaluation of his coronary artery disease and essential hypertension. ? ?Past Medical History  ?  ?Past Medical History:  ?Diagnosis Date  ? Blepharitis of both eyes   ? Chronic  ? Cataracts, both eyes   ? CKD (chronic kidney disease) stage 3, GFR 30-59 ml/min (HCC)   ? s/p R Nephrectomy/adrenalectomy & DM-Nephropathy (baseline creatinine 1.4-1.5)  ? COVID-19 virus infection 04/25/2019  ? Diabetic peripheral neuropathy associated with type 2 diabetes mellitus ([place])   ? Essential hypertension   ? GERD without esophagitis   ? History of basal cell carcinoma (BCC) excision   ? History of complete heart block 06/2016  ? s/p BiV PPM (now followed by Dr. Merlyn Lot - EP)  ? History of non-ST elevation myocardial infarction (NSTEMI)   ? And several occasions of unstable angina  ? Hx of CABGx4 08/25/2019  ? (Dr. Briant Cedar, [facility]): LIMA-LAD, Seq Rad- OM2-LPDA, RIMA-? OM1 (called Ramus on report, but no ramus seen on cath).  ? Hyperlipidemia associated with type 2 diabetes mellitus ([place])   ? on atorvastatin 40 mg.  ? Multiple Vessel CAD -- s/p PCI of LAD, OM1, LPL1, LPDA & non-dominant RCA --> now S/P CABG x 4 60%  ? (No cath report prior to 2016) (stents in proxLAD, prox OM1 x 2, prox LPL1 & 3-4 in L PDA & ~2 in  Known non-dominant RCA;  07/2019: MV CAD - patent stents in p-m LAD, p OM1, p LPL & LPDA as well as RCA -> Ost LCx ~60-70% (DFR ++), long mLAD 60% post-stent (DFR ++) --> referred for CABG  ? Obesity (BMI 30.0-34.9)   ? Occlusion of left vertebral artery   ? Consider repossible acute lesion in August 2020 with presentation of headache.  CTA suggested occluded left vertebral artery throughout the neck.  Faint string-like enhancement  intermittently visible suggesting recent occlusion.  Partial reconstruction and posterior fossa.  Left PICA patent.  (Consider possible left vertebral artery dissection)  ? OSA treated with BiPAP 02/2019  ? Diagnosis in late 2020 ([facility])--> delayed onset of treatment with BiPAP due to Covid hospitalization followed by PE. ->  BiPAP setting at 21/17 cm  ? Pulmonary embolism ([place]) 05/18/2019  ? (1 month following COVID-19 infection): DVT-PE (bilateral PEs noted on VQ scan)-> started on warfarin.  (On warfarin plus Plavix now with aspirin stopped.)  ? Type 2 diabetes mellitus with complication, with long-term current use of insulin ([place])   ? On Lantus, Jardiance, Metformin & Onglyza  ? ?Past Surgical History:  ?Procedure Laterality Date  ? BASAL CELL CARCINOMA EXCISION  01/2015  ? CAROTID DUPLEX SCAN  12/25/2018  ? [facility]: Mild plaque in both carotids.  139% bilateral.  Right vertebral flow normal antegrade, Left not seen; normal bilateral subclavian flow..  ? CHOLECYSTECTOMY    ? CORONARY ARTERY BYPASS GRAFT N/A 08/25/2019  ? Procedure: CORONARY ARTERY BYPASS GRAFTING (CABG) TIMES FOUR, ON PUMP, USING LEFT AND RIGHT INTERNAL MAMMARY ARTERY AND LEFT RADIAL ARTERY;  Surgeon: [name], MD;  Location: [facility] OR;  Service: Open Heart Surgery;; LIMA-LAD, Seq Rad OM2-LPDA, RIMA-OM1  ? CORONARY STENT INTERVENTION  04/10/2015  ? ([place]; Bishop Limbo, DO) -> DES PCI mLPDA:  Xience Alpine DES 2.5 mm x 18 mm, Xience Alpine DES 2.25 mm x 12 mm overlapping.  ? CORONARY STENT INTERVENTION  04/04/2016  ? ([place]; Myer Haff, MD)--> (urgent) 100% mLPDA PTCA with 2.25 mm balloon - reduced to 50%; mid OM2 90% - DES PCI (Xience DES  2.5 x 18).   ? CORONARY STENT INTERVENTION  2015  ? Prior to December 2016, STENTS noted in prox LAD, proximal OM1, and at least 2 stents in proximal L PDA  ? CORONARY STENT INTERVENTION  11/17/2018  ? ([place]; Bishop Limbo, DO):  CULPRIT: mid RCA 90% (DES PCI) -Resolute Onyx DES 2.5 mm x 30 mm --> COMPLICATION: Large left arm hematoma-evaluated by vascular and orthopedic surgery, managed with splint and arm elevation.  ? CORONARY STENT INTERVENTION  12/28/2018  ? [facility]; Clarene Critchley, MD, referred by Dr. Mathis Bud) --> INDICATION (urgent, Unstable Angina): Moderate-severe (70%) stenosis of ostial RCA  -> DES PCI Resolute Onyx DES 2.5 mm x 12 mm.   ? LEFT HEART CATH AND CORONARY ANGIOGRAPHY  04/10/2015  ? ([place]; Bishop Limbo, DO)--> EF 55%.  Mild inferior HK.  Coronaries-LM: Normal, p LAD STENT 30% ISR, mLAD 20% & diffuse dLAD ~20%; Dom LCx: mCx 20%, ~pOM1 STENT (noted as OM2 in other reports) patent with mid 30%, OM2 normal, prox LPDA "STENTS" patent with SEVERE mid L PDA 90-95% (DES PCI x 2 overlapping); Small-non-dominant RCA  patent   ? LEFT HEART CATH AND CORONARY ANGIOGRAPHY  04/04/2016  ? ([place]; Cove, MD)--> (urgent): EF 70%.  Previous LAD,~OM2 and proximal PDA stents patent; -> mLAD 35%, dLAD 40%; LCx-OM1 75% (short lesion, med Rx), mid OM2 90% (DES PCI), pLPDA stents patent w/ mPDA 100% (PTCA only - reduced to 50%); non-dom RCA - ost RCA 60% & mRCA 75%  ? LEFT HEART CATH AND CORONARY ANGIOGRAPHY  11/17/2018  ? ([place]; Bishop Limbo, DO) indication: Angina.  LM normal; LAD - ost LAD ~50%, pLAD STENT ~30% ISR with dLAD ~40%; Dom LCx - ost & prox Cx 30%, OM1 STENT 20% ISR, OM2 STENT 50% distal edge, pLPDA overlapping STENTS ~20%; nonDom RCA - proxRCA 40%, mid 90% (DES PCI)  ? LEFT HEART CATH AND CORONARY ANGIOGRAPHY  12/28/2018  ? [facility]; Clarene Critchley, MD, referred by Dr. Mathis Bud) --> INDICATION (urgent, Unstable Angina): Moderate-severe (70%) stenosis of ostial RCA (DES PCI), mRCA 30%.  Otherwise no significant change from July 2020: dLM 20%, pLAD ~40% ISR , dLAD long/diffuse ~50%; OstLCx 30%, OM1 ~15% OM2 ~30% with patent  LPDA  stents/PTCA site.   ? LEFT HEART CATH AND CORONARY ANGIOGRAPHY N/A 08/20/2019  ? Procedure: LEFT HEART CATH AND CORONARY ANGIOGRAPHY;  Surgeon: Leonie Man, MD;  Location: [place] [facility];  Service: Cardiovascular;; Multivessel CAD: Patent stents in proximal to mid LAD, proximal OM1, proximal LPL 1 and several stents in L PDA as well as RCA (nondominant).  Ostial LCx 60%-highly DFR +0.84.  There is a long mid LAD 60% stenosis after the stent--highly DFR +0.67.--> CABG  ? NEPHRECTOMY Right 2012  ? With adrenalectomy  ? NM MYOVIEW LTD  08/20/2018  ? [facility] -> Lexiscan Myoview: EF 81%.  No reversible ischemia or infarction.  Normal wall motion.  ? PACEMAKER IMPLANT  06/2016  ? [place] [place] ([employer]) -> according to CT of chest, leads positioned in right atrium, cardiac apex and coronary sinus (suggesting CRT-P -  BiV Pacing))  ? RADIAL ARTERY HARVEST Left 08/25/2019  ? Procedure: RADIAL ARTERY HARVEST;  Surgeon: [name], MD;  Location: [place];  Service: Open Heart Surgery;  Laterality: Left;  ? TEE WITHOUT CARDIOVERSION N/A 08/25/2019  ? Procedure: TRANSESOPHAGEAL ECHOCARDIOGRAM (TEE);  Surgeon: [name], MD;  Location: [place];  Service: Open Heart Surgery;  Laterality: N/A;  ? TRANSTHORACIC ECHOCARDIOGRAM  11/2018; 05/01/2019  ? ([place]) a) EF 60-65%.  Mild TR.;; b)moderate concentric LVH.  EF 55 to 60%.  No R WMA.  Normal RV size and function.  Normal atrial sizes.  Normal valves.  ? ? ?Allergies ? ?Allergies  ?Allergen Reactions  ? Latex Rash  ? Codeine Nausea And Vomiting  ? Contrast Media [Iodinated Contrast Media]   ?  Reportedly cardiac arrest  ? Integrilin [Eptifibatide]   ?  Reportedly had shortness of breath, confusion.  ? Tylenol [Acetaminophen]   ?  Tylenol -3 with codiene  ? Zithromax [Azithromycin] Nausea And Vomiting  ? Glipizide Rash  ?  Headache  ? ? ?History of Present Illness  ?  ?John Dodson is a PMH of chest pain,  coronary artery disease status post CABG times 08/17/2019, CHB status post biventricular PPM 3/18, palpitations, HTN, HLD, and pulmonary embolism. ? ?He was seen and evaluated by Dr. Ellyn Hack on 05/16/2021.  [place]

## 2021-07-31 ENCOUNTER — Ambulatory Visit: Payer: Medicare Other | Admitting: General Practice

## 2021-07-31 ENCOUNTER — Encounter: Payer: Self-pay | Admitting: General Practice

## 2021-07-31 VITALS — BP 120/76 | HR 79 | Ht 68.0 in | Wt 209.4 lb

## 2021-07-31 DIAGNOSIS — I10 Essential (primary) hypertension: Secondary | ICD-10-CM | POA: Diagnosis not present

## 2021-07-31 DIAGNOSIS — R0789 Other chest pain: Secondary | ICD-10-CM

## 2021-07-31 DIAGNOSIS — E1169 Type 2 diabetes mellitus with other specified complication: Secondary | ICD-10-CM

## 2021-07-31 DIAGNOSIS — I442 Atrioventricular block, complete: Secondary | ICD-10-CM | POA: Diagnosis not present

## 2021-07-31 DIAGNOSIS — E785 Hyperlipidemia, unspecified: Secondary | ICD-10-CM

## 2021-07-31 DIAGNOSIS — Z951 Presence of aortocoronary bypass graft: Secondary | ICD-10-CM | POA: Diagnosis not present

## 2021-07-31 NOTE — Patient Instructions (Signed)
Medication Instructions:  ?The current medical regimen is effective;  continue present plan and medications as directed. Please refer to the Current Medication list given to you today.  ? ?*If you need a refill on your cardiac medications before your next appointment, please call your pharmacy* ? ?Lab Work:   Testing/Procedures:  ?NONE    NONE ? ?Special Instructions ?PLEASE READ AND FOLLOW SALTY 6-ATTACHED-1,'800mg'$  daily ? ?PLEASE INCREASE PHYSICAL ACTIVITY AS TOLERATED GOAL= 30 MIN DAILY 5 DAYS A WEEK ? ?Follow-Up: ?Your next appointment:  4-6 month(s) In Person with Glenetta Hew, MD  or Coletta Memos, FNP  ? ?At [facility], you and your health needs are our priority.  As part of our continuing mission to provide you with exceptional heart care, we have created designated Provider Care Teams.  These Care Teams include your primary Cardiologist (physician) and Advanced Practice Providers (APPs -  Physician Assistants and Nurse Practitioners) who all work together to provide you with the care you need, when you need it. ? ? ? ?        6 SALTY THINGS TO AVOID     1,'800MG'$  DAILY ? ? ? ? ? ? ?

## 2021-09-14 ENCOUNTER — Ambulatory Visit (INDEPENDENT_AMBULATORY_CARE_PROVIDER_SITE_OTHER): Payer: Medicare Other

## 2021-09-14 DIAGNOSIS — I442 Atrioventricular block, complete: Secondary | ICD-10-CM | POA: Diagnosis not present

## 2021-09-16 LAB — CUP PACEART REMOTE DEVICE CHECK
Battery Remaining Longevity: 34 mo
Battery Voltage: 2.93 V
Brady Statistic AP VP Percent: 0.88 %
Brady Statistic AP VS Percent: 0.01 %
Brady Statistic AS VP Percent: 99.11 %
Brady Statistic AS VS Percent: 0.01 %
Brady Statistic RA Percent Paced: 0.87 %
Brady Statistic RV Percent Paced: 99.98 %
Date Time Interrogation Session: 20230519110138
Implantable Lead Implant Date: 20180315
Implantable Lead Implant Date: 20180315
Implantable Lead Implant Date: 20180315
Implantable Lead Location: 753858
Implantable Lead Location: 753859
Implantable Lead Location: 753860
Implantable Lead Model: 4076
Implantable Lead Model: 4298
Implantable Lead Model: 5076
Implantable Pulse Generator Implant Date: 20180315
Lead Channel Impedance Value: 285 Ohm
Lead Channel Impedance Value: 304 Ohm
Lead Channel Impedance Value: 342 Ohm
Lead Channel Impedance Value: 399 Ohm
Lead Channel Impedance Value: 399 Ohm
Lead Channel Impedance Value: 418 Ohm
Lead Channel Impedance Value: 437 Ohm
Lead Channel Impedance Value: 475 Ohm
Lead Channel Impedance Value: 475 Ohm
Lead Channel Impedance Value: 703 Ohm
Lead Channel Impedance Value: 722 Ohm
Lead Channel Impedance Value: 760 Ohm
Lead Channel Impedance Value: 760 Ohm
Lead Channel Impedance Value: 798 Ohm
Lead Channel Pacing Threshold Amplitude: 1.125 V
Lead Channel Pacing Threshold Amplitude: 1.25 V
Lead Channel Pacing Threshold Amplitude: 1.25 V
Lead Channel Pacing Threshold Pulse Width: 0.4 ms
Lead Channel Pacing Threshold Pulse Width: 0.4 ms
Lead Channel Pacing Threshold Pulse Width: 0.9 ms
Lead Channel Sensing Intrinsic Amplitude: 1.125 mV
Lead Channel Sensing Intrinsic Amplitude: 1.125 mV
Lead Channel Sensing Intrinsic Amplitude: 19.75 mV
Lead Channel Sensing Intrinsic Amplitude: 19.75 mV
Lead Channel Setting Pacing Amplitude: 2.5 V
Lead Channel Setting Pacing Amplitude: 2.5 V
Lead Channel Setting Pacing Amplitude: 2.75 V
Lead Channel Setting Pacing Pulse Width: 0.4 ms
Lead Channel Setting Pacing Pulse Width: 0.9 ms
Lead Channel Setting Sensing Sensitivity: 4 mV

## 2021-09-20 NOTE — Progress Notes (Signed)
Remote pacemaker transmission.   

## 2021-09-26 ENCOUNTER — Telehealth: Payer: Self-pay | Admitting: Cardiology

## 2021-09-26 NOTE — Telephone Encounter (Signed)
Pt c/o of Chest Pain: STAT if CP now or developed within 24 hours  1. Are you having CP right now?  No   2. Are you experiencing any other symptoms (ex. SOB, nausea, vomiting, sweating)?  Pressure in upper left chest, recently had an upper respiratory infection over the past few weeks but it has gotten better, per Myriam Jacobson, no other symptoms reported  3. How long have you been experiencing CP?  Past few weeks, becoming gradually worse  4. Is your CP continuous or coming and going?  Coming and going, worse with exertion  5. Have you taken Nitroglycerin?  Myriam Jacobson is unsure, states she will ask patient ?

## 2021-09-26 NOTE — Telephone Encounter (Signed)
Contacted patient, he states that he has been having some pressure in his chest, but has worsened over the last few weeks.  He states over the weekend was his last episode- he took two nitroglycerins and did feel better. He states he is okay today, no chest pains or pressure concerns since this weekend. He does state he has been short of breath, and has some swelling. He does states it gets worse with activity. We did discuss going to the ED if the chest pressure occurs again, he verbalized he will call 911 if this occurs, or if he has to take nitroglycerin again.  Patient was scheduled with DOD (Dr.Jordan) on Friday, but did advise if he has chest pain he should go to ED. Patient did verbalize understanding. Thankful for call back.

## 2021-09-28 ENCOUNTER — Ambulatory Visit: Payer: Medicare Other | Admitting: Cardiology

## 2021-09-28 ENCOUNTER — Encounter: Payer: Self-pay | Admitting: Cardiology

## 2021-09-28 VITALS — BP 148/60 | HR 99 | Ht 68.0 in | Wt 213.8 lb

## 2021-09-28 DIAGNOSIS — I442 Atrioventricular block, complete: Secondary | ICD-10-CM | POA: Diagnosis not present

## 2021-09-28 DIAGNOSIS — I25708 Atherosclerosis of coronary artery bypass graft(s), unspecified, with other forms of angina pectoris: Secondary | ICD-10-CM | POA: Diagnosis not present

## 2021-09-28 DIAGNOSIS — I2699 Other pulmonary embolism without acute cor pulmonale: Secondary | ICD-10-CM | POA: Diagnosis not present

## 2021-09-28 DIAGNOSIS — Z7901 Long term (current) use of anticoagulants: Secondary | ICD-10-CM

## 2021-09-28 DIAGNOSIS — Z951 Presence of aortocoronary bypass graft: Secondary | ICD-10-CM | POA: Diagnosis not present

## 2021-09-28 DIAGNOSIS — I10 Essential (primary) hypertension: Secondary | ICD-10-CM

## 2021-09-28 NOTE — Progress Notes (Signed)
Cardiology Office Note   Date:  09/28/2021   ID:  NAOD SWEETLAND, DOB 1961/02/06, MRN [id number]  PCP:  Kristopher Glee., MD  Cardiologist:  Glenetta Hew MD  Chief Complaint  Patient presents with   Chest Pain   Coronary Artery Disease      History of Present Illness: John Dodson is a 61 y.o. male who presents for evaluation of chest pain. He is a patient of Dr Ellyn Hack. He has a PMH of chest pain, coronary artery disease status post CABG times 08/17/2019, CHB status post biventricular PPM 3/18, palpitations, HTN, HLD, and pulmonary embolism.   He was seen and evaluated by Dr. Ellyn Hack on 05/16/2021.  During that time it was felt that he had chest wall/musculoskeletal versus costochondritis.  It was recommended that he take alternating acetaminophen and ibuprofen for discomfort.  He was instructed to hold his Plavix while taking ibuprofen.  His aspirin was stopped.  No plans for ischemic evaluation were made at that time.  He was instructed to follow-up in 1 month.  Recommendation for follow-up chest CT was made if symptoms persisted.   He was seen by Coletta Memos NP on 07/31/21 and states he continues to have only occasional episodes of chest discomfort which he feels are related to indigestion.  no pain with exertion.  Today  he reports that for the past 2 weeks he has had episodes of acute pain in his left upper chest radiating down his left arm and the back of his neck. States he couldn't move his arm it hurt so bad. This is associated with SOB and sweating. Took sl Ntg a couple of times last week without benefit. Typically symptoms last 5-10 minutes. Symptoms are different than prior coronary pain which was more central and right sided and clearly exertional.     Past Medical History:  Diagnosis Date   Blepharitis of both eyes    Chronic   Cataracts, both eyes    CKD (chronic kidney disease) stage 3, GFR 30-59 ml/min (HCC)    s/p R Nephrectomy/adrenalectomy &  DM-Nephropathy (baseline creatinine 1.4-1.5)   COVID-19 virus infection 04/25/2019   Diabetic peripheral neuropathy associated with type 2 diabetes mellitus (HCC)    Essential hypertension    GERD without esophagitis    History of basal cell carcinoma (BCC) excision    History of complete heart block 06/2016   s/p BiV PPM (now followed by Dr. Merlyn Lot - EP)   History of non-ST elevation myocardial infarction (NSTEMI)    And several occasions of unstable angina   Hx of CABGx4 08/25/2019   (Dr. Briant Cedar, [facility]): LIMA-LAD, Seq Rad- OM2-LPDA, RIMA-? OM1 (called Ramus on report, but no ramus seen on cath).   Hyperlipidemia associated with type 2 diabetes mellitus ([place])    on atorvastatin 40 mg.   Multiple Vessel CAD -- s/p PCI of LAD, OM1, LPL1, LPDA & non-dominant RCA --> now S/P CABG x 4 60%   (No cath report prior to 2016) (stents in proxLAD, prox OM1 x 2, prox LPL1 & 3-4 in L PDA & ~2 in  Known non-dominant RCA;  07/2019: MV CAD - patent stents in p-m LAD, p OM1, p LPL & LPDA as well as RCA -> Ost LCx ~60-70% (DFR ++), long mLAD 60% post-stent (DFR ++) --> referred for CABG   Obesity (BMI 30.0-34.9)    Occlusion of left vertebral artery    Consider repossible acute lesion in August 2020 with presentation of  headache.  CTA suggested occluded left vertebral artery throughout the neck.  Faint string-like enhancement intermittently visible suggesting recent occlusion.  Partial reconstruction and posterior fossa.  Left PICA patent.  (Consider possible left vertebral artery dissection)   OSA treated with BiPAP 02/2019   Diagnosis in late 2020 ([facility])--> delayed onset of treatment with BiPAP due to Covid hospitalization followed by PE. ->  BiPAP setting at 21/17 cm   Pulmonary embolism ([place]) 05/18/2019   (1 month following COVID-19 infection): DVT-PE (bilateral PEs noted on VQ scan)-> started on warfarin.  (On warfarin plus Plavix now with aspirin stopped.)   Type 2 diabetes mellitus  with complication, with long-term current use of insulin ([place])    On Lantus, Jardiance, Metformin & Onglyza    Past Surgical History:  Procedure Laterality Date   BASAL CELL CARCINOMA EXCISION  01/2015   CAROTID DUPLEX SCAN  12/25/2018   [facility]: Mild plaque in both carotids.  139% bilateral.  Right vertebral flow normal antegrade, Left not seen; normal bilateral subclavian flow..   CHOLECYSTECTOMY     CORONARY ARTERY BYPASS GRAFT N/A 08/25/2019   Procedure: CORONARY ARTERY BYPASS GRAFTING (CABG) TIMES FOUR, ON PUMP, USING LEFT AND RIGHT INTERNAL MAMMARY ARTERY AND LEFT RADIAL ARTERY;  Surgeon: [name], MD;  Location: [facility];  Service: Open Heart Surgery;; LIMA-LAD, Seq Rad OM2-LPDA, RIMA-OM1   CORONARY STENT INTERVENTION  04/10/2015   ([place]; Bishop Limbo, DO) -> DES PCI mLPDA: Xience Alpine DES 2.5 mm x 18 mm, Xience Alpine DES 2.25 mm x 12 mm overlapping.   CORONARY STENT INTERVENTION  04/04/2016   ([place]; [place], MD)--> (urgent) 100% mLPDA PTCA with 2.25 mm balloon - reduced to 50%; mid OM2 90% - DES PCI (Xience DES  2.5 x 18).    CORONARY STENT INTERVENTION  2015   Prior to December 2016, STENTS noted in prox LAD, proximal OM1, and at least 2 stents in proximal L PDA   CORONARY STENT INTERVENTION  11/17/2018   ([place]; Bishop Limbo, DO): CULPRIT: mid RCA 90% (DES PCI) -Resolute Onyx DES 2.5 mm x 30 mm --> COMPLICATION: Large left arm hematoma-evaluated by vascular and orthopedic surgery, managed with splint and arm elevation.   CORONARY STENT INTERVENTION  12/28/2018   ([place]; Clarene Critchley, MD, referred by Dr. Mathis Bud) --> INDICATION (urgent, Unstable Angina): Moderate-severe (70%) stenosis of ostial RCA  -> DES PCI Resolute Onyx DES 2.5 mm x 12 mm.    LEFT HEART CATH AND CORONARY ANGIOGRAPHY  04/10/2015   ([place]; Bishop Limbo, DO)--> EF 55%.  Mild inferior HK.   Coronaries-LM: Normal, p LAD STENT 30% ISR, mLAD 20% & diffuse dLAD ~20%; Dom LCx: mCx 20%, ~pOM1 STENT (noted as OM2 in other reports) patent with mid 30%, OM2 normal, prox LPDA "STENTS" patent with SEVERE mid L PDA 90-95% (DES PCI x 2 overlapping); Small-non-dominant RCA  patent    LEFT HEART CATH AND CORONARY ANGIOGRAPHY  04/04/2016   ([place]; [place], MD)--> (urgent): EF 70%.  Previous LAD,~OM2 and proximal PDA stents patent; -> mLAD 35%, dLAD 40%; LCx-OM1 75% (short lesion, med Rx), mid OM2 90% (DES PCI), pLPDA stents patent w/ mPDA 100% (PTCA only - reduced to 50%); non-dom RCA - ost RCA 60% & mRCA 75%   LEFT HEART CATH AND CORONARY ANGIOGRAPHY  11/17/2018   ([place]; Bishop Limbo, DO) indication: Angina.  LM normal; LAD - ost  LAD ~50%, pLAD STENT ~30% ISR with dLAD ~40%; Dom LCx - ost & prox Cx 30%, OM1 STENT 20% ISR, OM2 STENT 50% distal edge, pLPDA overlapping STENTS ~20%; nonDom RCA - proxRCA 40%, mid 90% (DES PCI)   LEFT HEART CATH AND CORONARY ANGIOGRAPHY  12/28/2018   ([place]; Clarene Critchley, MD, referred by Dr. Jeneen Rinks [place]ukin) --> INDICATION (urgent, Unstable Angina): Moderate-severe (70%) stenosis of ostial RCA (DES PCI), mRCA 30%.  Otherwise no significant change from July 2020: dLM 20%, pLAD ~40% ISR , dLAD long/diffuse ~50%; OstLCx 30%, OM1 ~15% OM2 ~30% with patent  LPDA stents/PTCA site.    LEFT HEART CATH AND CORONARY ANGIOGRAPHY N/A 08/20/2019   Procedure: LEFT HEART CATH AND CORONARY ANGIOGRAPHY;  Surgeon: Leonie Man, MD;  Location: [facility];  Service: Cardiovascular;; Multivessel CAD: Patent stents in proximal to mid LAD, proximal OM1, proximal LPL 1 and several stents in L PDA as well as RCA (nondominant).  Ostial LCx 60%-highly DFR +0.84.  There is a long mid LAD 60% stenosis after the stent--highly DFR +0.67.--> CABG   NEPHRECTOMY Right 2012   With adrenalectomy   NM MYOVIEW LTD  08/20/2018   [facility]  -> Lexiscan Myoview: EF 81%.  No reversible ischemia or infarction.  Normal wall motion.   PACEMAKER IMPLANT  06/2016   [facility] [place] ([employer]) -> according to CT of chest, leads positioned in right atrium, cardiac apex and coronary sinus (suggesting CRT-P - BiV Pacing))   RADIAL ARTERY HARVEST Left 08/25/2019   Procedure: RADIAL ARTERY HARVEST;  Surgeon: [name], MD;  Location: [facility];  Service: Open Heart Surgery;  Laterality: Left;   TEE WITHOUT CARDIOVERSION N/A 08/25/2019   Procedure: TRANSESOPHAGEAL ECHOCARDIOGRAM (TEE);  Surgeon: [name], MD;  Location: [place];  Service: Open Heart Surgery;  Laterality: N/A;   TRANSTHORACIC ECHOCARDIOGRAM  11/2018; 05/01/2019   ([place]) a) EF 60-65%.  Mild TR.;; b)moderate concentric LVH.  EF 55 to 60%.  No R WMA.  Normal RV size and function.  Normal atrial sizes.  Normal valves.     Current Outpatient Medications  Medication Sig Dispense Refill   albuterol (VENTOLIN HFA) 108 (90 Base) MCG/ACT inhaler Inhale 2 puffs into the lungs every 4 (four) hours as needed for wheezing or shortness of breath.     allopurinol (ZYLOPRIM) 300 MG tablet Take 300 mg by mouth daily.     amLODipine (NORVASC) 5 MG tablet Take 1 tablet (5 mg total) by mouth daily. 90 tablet 3   atorvastatin (LIPITOR) 40 MG tablet Take 1 tablet (40 mg total) by mouth daily. 90 tablet 3   calcium-vitamin D (OSCAL WITH D) 500-200 MG-UNIT tablet Take 1 tablet by mouth daily. 50 MCG (2000UNIT ) TAB CHOLECALCIFEROL     carvedilol (COREG) 12.5 MG tablet Take 1 tablet (12.5 mg total) by mouth 2 (two) times daily with a meal. 180 tablet 3   clopidogrel (PLAVIX) 75 MG tablet Take 1 tablet (75 mg total) by mouth daily. Please keep scheduled appointment in Nov for further refills. 30 tablet 0   colchicine 0.6 MG tablet Take 0.6 mg by mouth as needed (gout flare ups).     fluticasone (FLONASE) 50 MCG/ACT nasal spray Place 2 sprays into both  nostrils as needed for congestion.     furosemide (LASIX) 40 MG tablet Take 1 tablet (40 mg total) by mouth daily as needed for fluid or edema (Poor more than  3 pound weight gain overnight or more than 5 pound weight gain over baseline). 30 tablet 11   insulin glargine (LANTUS SOLOSTAR) 100 UNIT/ML Solostar Pen Inject 50 Units into the skin every evening. 100UNITS/3 ML     JARDIANCE 25 MG TABS tablet Take 25 mg by mouth daily.     losartan (COZAAR) 25 MG tablet Take 12.5 mg by mouth daily.     MAGNESIUM OXIDE PO Take 500 mg by mouth daily. TAKE 1  TABLET BY MOUTH NIGHTLY FOR LEG CRAMPS     nitroGLYCERIN (NITROSTAT) 0.4 MG SL tablet Place 1 tablet (0.4 mg total) under the tongue every 5 (five) minutes as needed for chest pain. 25 tablet 6   Omega-3 Fatty Acids (FISH OIL BURP-LESS) 1000 MG CAPS Take 1,000 mg by mouth 2 (two) times daily.     OZEMPIC, 0.25 OR 0.5 MG/DOSE, 2 MG/1.5ML SOPN Inject 0.5 mg into the skin once a week.     pantoprazole (PROTONIX) 40 MG tablet Take 40 mg by mouth daily.     traMADol (ULTRAM) 50 MG tablet Take 50 mg by mouth every 6 (six) hours as needed for pain.     No current facility-administered medications for this visit.    Allergies:   Latex, Codeine, Contrast media [iodinated contrast media], Integrilin [eptifibatide], Tylenol [acetaminophen], Zithromax [azithromycin], and Glipizide    Social History:  The patient  reports that he has never smoked. He has never used smokeless tobacco. He reports that he does not drink alcohol and does not use drugs.   Family History:  The patient's family history includes Coronary artery disease in his father; Hyperlipidemia in his father and mother; Hypertension in his father and mother; Stroke in his mother.    ROS:  Please see the history of present illness.   Otherwise, review of systems are positive for none.   All other systems are reviewed and negative.    PHYSICAL EXAM: VS:  BP (!) 148/60   Pulse 99   Ht '5\' 8"'$  (1.727  m)   Wt 213 lb 12.8 oz (97 kg)   SpO2 99%   BMI 32.51 kg/m  , BMI Body mass index is 32.51 kg/m. GEN: Well nourished, overweight, in no acute distress HEENT: normal Neck: no JVD, carotid bruits, or masses Cardiac: RRR; no murmurs, rubs, or gallops,no edema  Respiratory:  clear to auscultation bilaterally, normal work of breathing GI: soft, nontender, nondistended, + BS MS: no deformity or atrophy Skin: warm and dry, no rash Neuro:  Strength and sensation are intact Psych: euthymic mood, full affect   EKG:  EKG is ordered today. The ekg ordered today demonstrates NSR with atrial sensing and V pacing rate 72. I have personally reviewed and interpreted this study.    Recent Labs: No results found for requested labs within last 8760 hours.    Lipid Panel    Component Value Date/Time   CHOL 122 08/24/2019 0331   TRIG 78 08/25/2019 2046   HDL 29 (L) 08/24/2019 0331   CHOLHDL 4.2 08/24/2019 0331   VLDL 29 08/24/2019 0331   LDLCALC 64 08/24/2019 0331      Wt Readings from Last 3 Encounters:  09/28/21 213 lb 12.8 oz (97 kg)  07/31/21 209 lb 6.4 oz (95 kg)  05/16/21 210 lb 6.4 oz (95.4 kg)      Other studies Reviewed: Additional studies/ records that were reviewed today include:  Echocardiogram 08/27/2019 IMPRESSIONS     1. No significant pericardial effusion noted.  Likely prominent prominent  epicardial fat pad. Given limited views - EF appears normal RV is normal  size.   FINDINGS   Pericardium: No significant pericardial effusion noted. Likely prominent  prominent epicardial fat pad. Given limited views - EF appears normal RV  is normal size.   Jenkins Rouge MD  Electronically signed by Jenkins Rouge MD  Signature Date/Time: 08/27/2019/8:15:20 AM    Cardiac catheterization 08/20/2019   The left ventricular systolic function is normal. The left ventricular ejection fraction is 55-65% by visual estimate. LV end diastolic pressure is normal.LV end diastolic  pressure is normal. ------------------- There is stent from ostial to almost distal small nondominant RCA: Ost RCA to Mid RCA stent is diffusely 35% stenosed. Ost Cx to Prox Cx lesion is 60% stenosed. Ost LAD to Mid LAD stent is 10% stenosed. Mid LAD to Dist LAD lesion is 60% stenosed. Previously placed 2nd Mrg stent (DES) is widely patent. Lat 2nd Mrg lesion is 80% stenosed. Previously described. Previously placed 1st LPL stent (DES) is widely patent. LPAV lesion is 50% stenosed. LPDA stent is 5% stenosed.   Multivessel CAD with essentially patent stents in proximal to mid LAD, proximal OM1, proximal LPL1, several stents in the L PDA as well as RCA that is a small nondominant vessel.  No obvious lesions within stents noted. Ostial LCx roughly 60% --> highly DFR positive with 0.84 Long mid LAD 60% after stent--highly DFR +0.67-0.73 Normal LVEDP   With significant LAD as well as ostial LCx disease--best course of action is CVTS consultation for CABG.       Glenetta Hew, MD  Event monitor 06/13/21: Study Highlights    Patch Wear Time:  14 days and 0 hours (2023-01-23T07:23:41-0500 to 2023-02-06T07:23:45-0500)   Patient had a min HR of 42 bpm, max HR of 118 bpm, and avg HR of 84 bpm. Predominant underlying rhythm was Possible Atrial and Ventricular Pacing. 8 episode(s) of AV Block (2nd) occurred, lasting a total of 46 secs. Possible pacemaker malfunction was  present. Isolated SVEs were rare (<1.0%), and no SVE Couplets or SVE Triplets were present. Isolated VEs were rare (<1.0%), and no VE Couplets or VE Triplets were present.   ASSESSMENT AND PLAN:  1.  CAD. S/p multiple stents in the past including proximal to mid LAD, left PL, left PDA and nondominant RCA. Status post CABG x4 by Dr. Julien Girt 08/25/2019 with LIMA to LAD, left radial graft to left PDA and OM as sequential graft, RIMA to ramus intermedius as a pedicle graft. Continue amlodipine, atorvastatin, carvedilol, Plavix Now  with worsening chest pain but symptoms different from prior angina. Doubt PE.  Recommend Lexiscan myoview study to assess risk. Would like to avoid contrast if possible given CKD.    2. Complete heart block- Status post Medtr[employer] BiV PPM 3/18 Follows with EP   3. Essential hypertension-BP has been controlled.    4. Hyperlipidemia -LDL 58 in March  5. History of PE following Covid infection in 2021.    Current medicines are reviewed at length with the patient today.  The patient does not have concerns regarding medicines.  The following changes have been made:  no change  Labs/ tests ordered today include:   Orders Placed This Encounter  Procedures   Cardiac Stress Test: Informed Consent Details: Physician/Practitioner Attestation; Transcribe to consent form and obtain patient signature   EKG 12-Lead         Disposition:   FU with Dr Ellyn Hack following stress test  Signed,  Danecia Underdown [place], MD  09/28/2021 3:42 PM    [facility] [address] [place], [place], [address] Phone [phone], Fax [phone]

## 2021-09-28 NOTE — Patient Instructions (Signed)
Medication Instructions:  No changes  *If you need a refill on your cardiac medications before your next appointment, please call your pharmacy*   Lab Work: Not  needed    Testing/Procedures: Will be schedule at [place] has requested that you have a lexiscan myoview. Please follow instruction sheet, as given.   Follow-Up: At [facility], you and your health needs are our priority.  As part of our continuing mission to provide you with exceptional heart care, we have created designated Provider Care Teams.  These Care Teams include your primary Cardiologist (physician) and Advanced Practice Providers (APPs -  Physician Assistants and Nurse Practitioners) who all work together to provide you with the care you need, when you need it.     Your next appointment:   1 month(s)  The format for your next appointment:   In Person  Provider:   Glenetta Hew, MD    Other instructions   Your doctor has scheduled you for a Myocardial Perfusion scan will  obtain information about the blood flow to your heart. The test consists of taking pictures of your heart in two phases: while resting and after a stress test.  The stress test may involve walking on a treadmill, or if you are unable to exercise adequately, you will be given a drug intended to have a similar effect on the heart to that of exercise.  The test will take approximately 3 to 4  hours to complete.  If you are pregnant or breastfeeding,  please notify the staff prior to your test.  How to prepare for your test: Do not eat or drink 2 hours prior to your test Do not consume products containing caffeine 12 hours prior to your test (examples: coffee (regular OR decaf), chocolate, sodas, tea) Your doctor may need you to hold certain medications prior to the test.  If so, these are listed below and should not be taken for 24 hours prior to the test.  If not listed below, you may take your  medications as normal.  You may resume taking held medications on your normal schedule once the test is complete.   Meds to hold: none Do bring a list of your current medications with you.  If you have held any meds in preparation for the test, please bring them, as you may be required to take them once the test is completed. Do wear comfortable clothes and walking shoes.  Do not wear dresses or overalls. Do NOT wear cologne, perfume, aftershave, or fragranced lotions the day of your test (deodorants okay). If these instructions are not followed your test will have to be rescheduled.   A nuclear cardiologist will review your test, prepare a report and send it to your physician.   If you have questions or concerns about your appointment, you can call the Nuclear Cardiology department at [phone]. If you cannot keep your appointment, please provide 48 hours notification to avoid a possible $50.00 charge to your account.   Please arrive 15 minutes prior to your appointment time for registration and insurance purposes

## 2021-10-01 ENCOUNTER — Telehealth (HOSPITAL_COMMUNITY): Payer: Self-pay | Admitting: *Deleted

## 2021-10-01 NOTE — Telephone Encounter (Signed)
Patient given detailed instructions per Myocardial Perfusion Study Information Sheet for the test on 10/03/21 Patient notified to arrive 15 minutes early and that it is imperative to arrive on time for appointment to keep from having the test rescheduled.  If you need to cancel or reschedule your appointment, please call the office within 24 hours of your appointment. . Patient verbalized understanding. John Dodson

## 2021-10-03 ENCOUNTER — Ambulatory Visit (HOSPITAL_COMMUNITY): Payer: Medicare Other | Attending: Cardiovascular Disease

## 2021-10-03 DIAGNOSIS — I442 Atrioventricular block, complete: Secondary | ICD-10-CM | POA: Diagnosis not present

## 2021-10-03 DIAGNOSIS — I25708 Atherosclerosis of coronary artery bypass graft(s), unspecified, with other forms of angina pectoris: Secondary | ICD-10-CM

## 2021-10-03 DIAGNOSIS — Z951 Presence of aortocoronary bypass graft: Secondary | ICD-10-CM | POA: Diagnosis not present

## 2021-10-03 LAB — MYOCARDIAL PERFUSION IMAGING
LV dias vol: 62 mL (ref 62–150)
LV sys vol: 23 mL
Nuc Stress EF: 62 %
Peak HR: 75 {beats}/min
Rest HR: 60 {beats}/min
Rest Nuclear Isotope Dose: 10.9 mCi
SDS: 0
SRS: 0
SSS: 0
ST Depression (mm): 0 mm
Stress Nuclear Isotope Dose: 32 mCi
TID: 0.95

## 2021-10-03 MED ORDER — TECHNETIUM TC 99M TETROFOSMIN IV KIT
10.9000 | PACK | Freq: Once | INTRAVENOUS | Status: AC | PRN
  Administered 2021-10-03: 10.9 via INTRAVENOUS

## 2021-10-03 MED ORDER — REGADENOSON 0.4 MG/5ML IV SOLN
0.4000 mg | Freq: Once | INTRAVENOUS | Status: AC
  Administered 2021-10-03: 0.4 mg via INTRAVENOUS

## 2021-10-03 MED ORDER — TECHNETIUM TC 99M TETROFOSMIN IV KIT
32.0000 | PACK | Freq: Once | INTRAVENOUS | Status: AC | PRN
  Administered 2021-10-03: 32 via INTRAVENOUS

## 2021-10-29 ENCOUNTER — Ambulatory Visit: Payer: Medicare Other | Admitting: Cardiology

## 2021-10-31 NOTE — Progress Notes (Signed)
Cardiology Office Note:    Date:  11/01/2021   ID:  [name], DOB [birth year], MRN [id number]  PCP:  Kristopher Glee., MD [place] Cardiologist: Glenetta Hew, MD   Reason for visit: Follow-up  History of Present Illness:    John Dodson is a 61 y.o. male with a hx of CAD s/p CABGx4 08/17/2019, CHB status post biventricular PPM 3/18, palpitations, HTN, HLD, and pulmonary embolism following COVID infection 2021.  He has had complaints of chest pain over the last 6 months.  In January it was thought to be costochondritis when he saw Dr. Ellyn Hack.  In April, he had complaints sounding like indigestion.  Then in June, he saw Dr. Martinique and complained of left upper chest pain rating down his left arm and the back of his neck x2 weeks.  Episodes associated shortness of breath and sweating.  Symptoms were different than his prior coronary pain which was more central and right-sided and clearly exertional.    Lexiscan stress 09/2021 was low risk with no ischemia or infarction.  Today, the patient states the chest pain he saw Dr. Martinique for in June has resolved.  He complains more about chronic incisional pain post CABG.  He does state he has easy fatigability and he is on disability.  He tries to walk for 10 to 15 minutes 3 to 4 days/week.  He occasionally uses nitroglycerin to see if this helps his discomfort -he has used 2 nitro in the past month.  He has lightheadedness with position changes so he gets up slowly.  No recent syncope.  He has occasional palpitations.  Last pacemaker check showed no arrhythmias.  He denies claudication.  He takes Lasix 1-2 times per week for leg swelling.  He denies PND, orthopnea and shortness of breath.  He does not weigh himself daily.    Past Medical History:  Diagnosis Date   Blepharitis of both eyes    Chronic   Cataracts, both eyes    CKD (chronic kidney disease) stage 3, GFR 30-59 ml/min (HCC)    s/p R Nephrectomy/adrenalectomy  & DM-Nephropathy (baseline creatinine 1.4-1.5)   COVID-19 virus infection 04/25/2019   Diabetic peripheral neuropathy associated with type 2 diabetes mellitus (HCC)    Essential hypertension    GERD without esophagitis    History of basal cell carcinoma (BCC) excision    History of complete heart block 06/2016   s/p BiV PPM (now followed by Dr. Merlyn Lot - EP)   History of non-ST elevation myocardial infarction (NSTEMI)    And several occasions of unstable angina   Hx of CABGx4 08/25/2019   (Dr. Briant Cedar, [facility]): LIMA-LAD, Seq Rad- OM2-LPDA, RIMA-? OM1 (called Ramus on report, but no ramus seen on cath).   Hyperlipidemia associated with type 2 diabetes mellitus ([place])    on atorvastatin 40 mg.   Multiple Vessel CAD -- s/p PCI of LAD, OM1, LPL1, LPDA & non-dominant RCA --> now S/P CABG x 4 60%   (No cath report prior to 2016) (stents in proxLAD, prox OM1 x 2, prox LPL1 & 3-4 in L PDA & ~2 in  Known non-dominant RCA;  07/2019: MV CAD - patent stents in p-m LAD, p OM1, p LPL & LPDA as well as RCA -> Ost LCx ~60-70% (DFR ++), long mLAD 60% post-stent (DFR ++) --> referred for CABG   Obesity (BMI 30.0-34.9)    Occlusion of left vertebral artery    Consider repossible acute lesion in August  2020 with presentation of headache.  CTA suggested occluded left vertebral artery throughout the neck.  Faint string-like enhancement intermittently visible suggesting recent occlusion.  Partial reconstruction and posterior fossa.  Left PICA patent.  (Consider possible left vertebral artery dissection)   OSA treated with BiPAP 02/2019   Diagnosis in late 2020 ([facility])--> delayed onset of treatment with BiPAP due to Covid hospitalization followed by PE. ->  BiPAP setting at 21/17 cm   Pulmonary embolism ([place]) 05/18/2019   (1 month following COVID-19 infection): DVT-PE (bilateral PEs noted on VQ scan)-> started on warfarin.  (On warfarin plus Plavix now with aspirin stopped.)   Type 2 diabetes mellitus  with complication, with long-term current use of insulin ([place])    On Lantus, Jardiance, Metformin & Onglyza    Past Surgical History:  Procedure Laterality Date   BASAL CELL CARCINOMA EXCISION  01/2015   CAROTID DUPLEX SCAN  12/25/2018   [facility]: Mild plaque in both carotids.  139% bilateral.  Right vertebral flow normal antegrade, Left not seen; normal bilateral subclavian flow..   CHOLECYSTECTOMY     CORONARY ARTERY BYPASS GRAFT N/A 08/25/2019   Procedure: CORONARY ARTERY BYPASS GRAFTING (CABG) TIMES FOUR, ON PUMP, USING LEFT AND RIGHT INTERNAL MAMMARY ARTERY AND LEFT RADIAL ARTERY;  Surgeon: [name], MD;  Location: [place] OR;  Service: Open Heart Surgery;; LIMA-LAD, Seq Rad OM2-LPDA, RIMA-OM1   CORONARY STENT INTERVENTION  04/10/2015   ([place]; Bishop Limbo, DO) -> DES PCI mLPDA: Xience Alpine DES 2.5 mm x 18 mm, Xience Alpine DES 2.25 mm x 12 mm overlapping.   CORONARY STENT INTERVENTION  04/04/2016   ([place]; Dodson, MD)--> (urgent) 100% mLPDA PTCA with 2.25 mm balloon - reduced to 50%; mid OM2 90% - DES PCI (Xience DES  2.5 x 18).    CORONARY STENT INTERVENTION  2015   Prior to December 2016, STENTS noted in prox LAD, proximal OM1, and at least 2 stents in proximal L PDA   CORONARY STENT INTERVENTION  11/17/2018   ([place]; Bishop Limbo, DO): CULPRIT: mid RCA 90% (DES PCI) -Resolute Onyx DES 2.5 mm x 30 mm --> COMPLICATION: Large left arm hematoma-evaluated by vascular and orthopedic surgery, managed with splint and arm elevation.   CORONARY STENT INTERVENTION  12/28/2018   ([place]; Clarene Critchley, MD, referred by Dr. Mathis Bud) --> INDICATION (urgent, Unstable Angina): Moderate-severe (70%) stenosis of ostial RCA  -> DES PCI Resolute Onyx DES 2.5 mm x 12 mm.    LEFT HEART CATH AND CORONARY ANGIOGRAPHY  04/10/2015   ([place]; Bishop Limbo, DO)--> EF 55%.  Mild inferior HK.   Coronaries-LM: Normal, p LAD STENT 30% ISR, mLAD 20% & diffuse dLAD ~20%; Dom LCx: mCx 20%, ~pOM1 STENT (noted as OM2 in other reports) patent with mid 30%, OM2 normal, prox LPDA "STENTS" patent with SEVERE mid L PDA 90-95% (DES PCI x 2 overlapping); Small-non-dominant RCA  patent    LEFT HEART CATH AND CORONARY ANGIOGRAPHY  04/04/2016   ([place]; [place], MD)--> (urgent): EF 70%.  Previous LAD,~OM2 and proximal PDA stents patent; -> mLAD 35%, dLAD 40%; LCx-OM1 75% (short lesion, med Rx), mid OM2 90% (DES PCI), pLPDA stents patent w/ mPDA 100% (PTCA only - reduced to 50%); non-dom RCA - ost RCA 60% & mRCA 75%   LEFT HEART CATH AND CORONARY ANGIOGRAPHY  11/17/2018   ([place]; Bishop Limbo, DO) indication: Angina.  LM  normal; LAD - ost LAD ~50%, pLAD STENT ~30% ISR with dLAD ~40%; Dom LCx - ost & prox Cx 30%, OM1 STENT 20% ISR, OM2 STENT 50% distal edge, pLPDA overlapping STENTS ~20%; nonDom RCA - proxRCA 40%, mid 90% (DES PCI)   LEFT HEART CATH AND CORONARY ANGIOGRAPHY  12/28/2018   ([place]; Clarene Critchley, MD, referred by Dr. Jeneen Rinks McGukin) --> INDICATION (urgent, Unstable Angina): Moderate-severe (70%) stenosis of ostial RCA (DES PCI), mRCA 30%.  Otherwise no significant change from July 2020: dLM 20%, pLAD ~40% ISR , dLAD long/diffuse ~50%; OstLCx 30%, OM1 ~15% OM2 ~30% with patent  LPDA stents/PTCA site.    LEFT HEART CATH AND CORONARY ANGIOGRAPHY N/A 08/20/2019   Procedure: LEFT HEART CATH AND CORONARY ANGIOGRAPHY;  Surgeon: Leonie Man, MD;  Location: [place] [facility];  Service: Cardiovascular;; Multivessel CAD: Patent stents in proximal to mid LAD, proximal OM1, proximal LPL 1 and several stents in L PDA as well as RCA (nondominant).  Ostial LCx 60%-highly DFR +0.84.  There is a long mid LAD 60% stenosis after the stent--highly DFR +0.67.--> CABG   NEPHRECTOMY Right 2012   With adrenalectomy   NM MYOVIEW LTD  08/20/2018   [facility]  -> Lexiscan Myoview: EF 81%.  No reversible ischemia or infarction.  Normal wall motion.   PACEMAKER IMPLANT  06/2016   [facility] [place] -> according to CT of chest, leads positioned in right atrium, cardiac apex and coronary sinus (suggesting CRT-P - BiV Pacing))   RADIAL ARTERY HARVEST Left 08/25/2019   Procedure: RADIAL ARTERY HARVEST;  Surgeon: [name], MD;  Location: [place];  Service: Open Heart Surgery;  Laterality: Left;   TEE WITHOUT CARDIOVERSION N/A 08/25/2019   Procedure: TRANSESOPHAGEAL ECHOCARDIOGRAM (TEE);  Surgeon: [name], MD;  Location: [place];  Service: Open Heart Surgery;  Laterality: N/A;   TRANSTHORACIC ECHOCARDIOGRAM  11/2018; 05/01/2019   ([place]) a) EF 60-65%.  Mild TR.;; b)moderate concentric LVH.  EF 55 to 60%.  No R WMA.  Normal RV size and function.  Normal atrial sizes.  Normal valves.    Current Medications: Current Meds  Medication Sig   albuterol (VENTOLIN HFA) 108 (90 Base) MCG/ACT inhaler Inhale 2 puffs into the lungs every 4 (four) hours as needed for wheezing or shortness of breath.   allopurinol (ZYLOPRIM) 300 MG tablet Take 300 mg by mouth daily.   amLODipine (NORVASC) 5 MG tablet Take 1 tablet (5 mg total) by mouth daily.   atorvastatin (LIPITOR) 40 MG tablet Take 1 tablet (40 mg total) by mouth daily.   carvedilol (COREG) 12.5 MG tablet Take 1 tablet (12.5 mg total) by mouth 2 (two) times daily with a meal.   clopidogrel (PLAVIX) 75 MG tablet Take 1 tablet (75 mg total) by mouth daily. Please keep scheduled appointment in Nov for further refills.   colchicine 0.6 MG tablet Take 0.6 mg by mouth as needed (gout flare ups).   fluticasone (FLONASE) 50 MCG/ACT nasal spray Place 2 sprays into both nostrils as needed for congestion.   furosemide (LASIX) 40 MG tablet Take 1 tablet (40 mg total) by mouth daily as needed for fluid or edema (Poor more than 3 pound weight gain overnight or more than 5  pound weight gain over baseline).   insulin glargine (LANTUS SOLOSTAR) 100 UNIT/ML Solostar Pen Inject 50 Units into the skin every evening. 100UNITS/3 ML   JARDIANCE 25 MG TABS tablet Take  25 mg by mouth daily.   losartan (COZAAR) 25 MG tablet Take 12.5 mg by mouth daily.   MAGNESIUM OXIDE PO Take 500 mg by mouth daily. TAKE 1  TABLET BY MOUTH NIGHTLY FOR LEG CRAMPS   nitroGLYCERIN (NITROSTAT) 0.4 MG SL tablet Place 1 tablet (0.4 mg total) under the tongue every 5 (five) minutes as needed for chest pain.   Omega-3 Fatty Acids (FISH OIL BURP-LESS) 1000 MG CAPS Take 1,000 mg by mouth 2 (two) times daily.   OZEMPIC, 0.25 OR 0.5 MG/DOSE, 2 MG/1.5ML SOPN Inject 0.5 mg into the skin once a week.   pantoprazole (PROTONIX) 40 MG tablet Take 40 mg by mouth daily.   traMADol (ULTRAM) 50 MG tablet Take 50 mg by mouth every 6 (six) hours as needed for pain.     Allergies:   Latex, Codeine, Contrast media [iodinated contrast media], Integrilin [eptifibatide], Tylenol [acetaminophen], Zithromax [azithromycin], and Glipizide   Social History   Socioeconomic History   Marital status: Married    Spouse name: Not on file   Number of children: Not on file   Years of education: Not on file   Highest education level: Not on file  Occupational History   Not on file  Tobacco Use   Smoking status: Never   Smokeless tobacco: Never  Vaping Use   Vaping Use: Never used  Substance and Sexual Activity   Alcohol use: No   Drug use: No   Sexual activity: Not on file  Other Topics Concern   Not on file  Social History Narrative   Not on file   Social Determinants of Health   Financial Resource Strain: Not on file  Food Insecurity: Not on file  Transportation Needs: Not on file  Physical Activity: Not on file  Stress: Not on file  Social Connections: Not on file     Family History: The patient's family history includes Coronary artery disease in his father; Hyperlipidemia in his father and mother;  Hypertension in his father and mother; Stroke in his mother.  ROS:   Please see the history of present illness.     EKGs/Labs/Other Studies Reviewed:    Recent Labs: No results found for requested labs within last 365 days.   Recent Lipid Panel Lab Results  Component Value Date/Time   CHOL 122 08/24/2019 03:31 AM   TRIG 78 08/25/2019 08:46 PM   HDL 29 (L) 08/24/2019 03:31 AM   LDLCALC 64 08/24/2019 03:31 AM    Physical Exam:    VS:  BP (!) 112/58   Pulse 74   Ht '5\' 8"'$  (1.727 m)   Wt 208 lb (94.3 kg)   SpO2 100%   BMI 31.63 kg/m    No data found.  Wt Readings from Last 3 Encounters:  11/01/21 208 lb (94.3 kg)  10/03/21 213 lb (96.6 kg)  09/28/21 213 lb 12.8 oz (97 kg)     GEN:  Well nourished, well developed in no acute distress HEENT: Normal NECK: No JVD; No carotid bruits CARDIAC: RRR, no murmurs, rubs, gallops; sternal incision tender to touch RESPIRATORY:  Clear to auscultation without rales, wheezing or rhonchi  ABDOMEN: Soft, non-tender, non-distended MUSCULOSKELETAL: No edema; No deformity  SKIN: Warm and dry NEUROLOGIC:  Alert and oriented PSYCHIATRIC:  Normal affect    ASSESSMENT AND PLAN   CAD, stable angina -S/p multiple stents in the past including proximal to mid LAD, left PL, left PDA and nondominant RCA. Status post CABG x4 by Dr. Julien Girt  08/25/2019 with LIMA to LAD, left radial graft to left PDA and OM as sequential graft, RIMA to ramus intermedius as a pedicle graft. -Lexiscan stress 09/2021 was low risk with no ischemia or infarction. -Continue antianginals including amlodipine and carvedilol. -Since his symptoms are more incisional pain, I do not think Imdur would be beneficial particularly with his borderline blood pressure -Continue Plavix monotherapy -no bleeding issues -Continue Lipitor -lipids monitored by PCP  Chronic diastolic heart failure, euvolemic -EF normal on echo in 2021 -Continue as needed Lasix -With CKD, he is followed by  nephrology  Complete heart block -Status post Medtr[employer] BiV PPM 3/18 -Follows with EP  Hypertension, well controlled -Continue current medications. -Goal BP is <130/80.  Recommend DASH diet (high in vegetables, fruits, low-fat dairy products, whole grains, poultry, fish, and nuts and low in sweets, sugar-sweetened beverages, and red meats), salt restriction and increase physical activity.  Hyperlipidemia with goal LDL less than 70 -Lipids checked March 2023 with total cholesterol 115, triglycerides 157, HDL 33 and non-HDL 82. -Continue Lipitor 40 mg daily. -He has recently restarted Ozempic--> hopefully with weight loss, his non-HDL will improve further. -Discussed cholesterol lowering diets - Mediterranean diet, DASH diet, vegetarian diet, low-carbohydrate diet and avoidance of trans fats.  Discussed healthier choice substitutes.  Nuts, high-fiber foods, and fiber supplements may also improve lipids.    Obesity -Discussed how even a 5-10% weight loss can have cardiovascular benefits.   -Recommend moderate intensity activity for 30 minutes 5 days/week and the DASH diet.  Disposition -no med changes today.  Follow-up in 6 months with Dr. Ellyn Hack.   Medication Adjustments/Labs and Tests Ordered: Current medicines are reviewed at length with the patient today.  Concerns regarding medicines are outlined above.  No orders of the defined types were placed in this encounter.  No orders of the defined types were placed in this encounter.   Patient Instructions  Medication Instructions:  No Changes *If you need a refill on your cardiac medications before your next appointment, please call your pharmacy*   Lab Work: No Labs If you have labs (blood work) drawn today and your tests are completely normal, you will receive your results only by: [place] (if you have MyChart) OR A paper copy in the mail If you have any lab test that is abnormal or we need to change your treatment, we  will call you to review the results.   Testing/Procedures: No Testing   Follow-Up: At [facility], you and your health needs are our priority.  As part of our continuing mission to provide you with exceptional heart care, we have created designated Provider Care Teams.  These Care Teams include your primary Cardiologist (physician) and Advanced Practice Providers (APPs -  Physician Assistants and Nurse Practitioners) who all work together to provide you with the care you need, when you need it.  We recommend signing up for the patient portal called "MyChart".  Sign up information is provided on this After Visit Summary.  MyChart is used to connect with patients for Virtual Visits (Telemedicine).  Patients are able to view lab/test results, encounter notes, upcoming appointments, etc.  Non-urgent messages can be sent to your provider as well.   To learn more about what you can do with MyChart, go to [email or web address].    Your next appointment:   6 month(s)  The format for your next appointment:   In Person  Provider:   Glenetta Hew, MD       Important Information  About Sugar         Signed, Warren Lacy, PA-C  11/01/2021 10:58 AM    [facility]

## 2021-11-01 ENCOUNTER — Ambulatory Visit: Payer: Medicare Other | Admitting: Physician Assistant

## 2021-11-01 ENCOUNTER — Encounter: Payer: Self-pay | Admitting: Physician Assistant

## 2021-11-01 VITALS — BP 112/58 | HR 74 | Ht 68.0 in | Wt 208.0 lb

## 2021-11-01 DIAGNOSIS — I25708 Atherosclerosis of coronary artery bypass graft(s), unspecified, with other forms of angina pectoris: Secondary | ICD-10-CM

## 2021-11-01 DIAGNOSIS — E1169 Type 2 diabetes mellitus with other specified complication: Secondary | ICD-10-CM

## 2021-11-01 DIAGNOSIS — Z951 Presence of aortocoronary bypass graft: Secondary | ICD-10-CM | POA: Diagnosis not present

## 2021-11-01 DIAGNOSIS — I10 Essential (primary) hypertension: Secondary | ICD-10-CM | POA: Diagnosis not present

## 2021-11-01 DIAGNOSIS — E785 Hyperlipidemia, unspecified: Secondary | ICD-10-CM

## 2021-11-01 DIAGNOSIS — I5032 Chronic diastolic (congestive) heart failure: Secondary | ICD-10-CM

## 2021-11-01 NOTE — Patient Instructions (Signed)
Medication Instructions:  No Changes *If you need a refill on your cardiac medications before your next appointment, please call your pharmacy*   Lab Work: No Labs If you have labs (blood work) drawn today and your tests are completely normal, you will receive your results only by: [place] (if you have MyChart) OR A paper copy in the mail If you have any lab test that is abnormal or we need to change your treatment, we will call you to review the results.   Testing/Procedures: No Testing   Follow-Up: At [facility], you and your health needs are our priority.  As part of our continuing mission to provide you with exceptional heart care, we have created designated Provider Care Teams.  These Care Teams include your primary Cardiologist (physician) and Advanced Practice Providers (APPs -  Physician Assistants and Nurse Practitioners) who all work together to provide you with the care you need, when you need it.  We recommend signing up for the patient portal called "MyChart".  Sign up information is provided on this After Visit Summary.  MyChart is used to connect with patients for Virtual Visits (Telemedicine).  Patients are able to view lab/test results, encounter notes, upcoming appointments, etc.  Non-urgent messages can be sent to your provider as well.   To learn more about what you can do with MyChart, go to [email or web address].    Your next appointment:   6 month(s)  The format for your next appointment:   In Person  Provider:   Glenetta Hew, MD       Important Information About Sugar

## 2021-12-02 IMAGING — DX DG CHEST 2V
2 series · 2 of 2 positions shown · non-contrast
Comparison: May 18, 2019

CLINICAL DATA: Angina pectoris

EXAM:
CHEST - 2 VIEW

[chest pa]
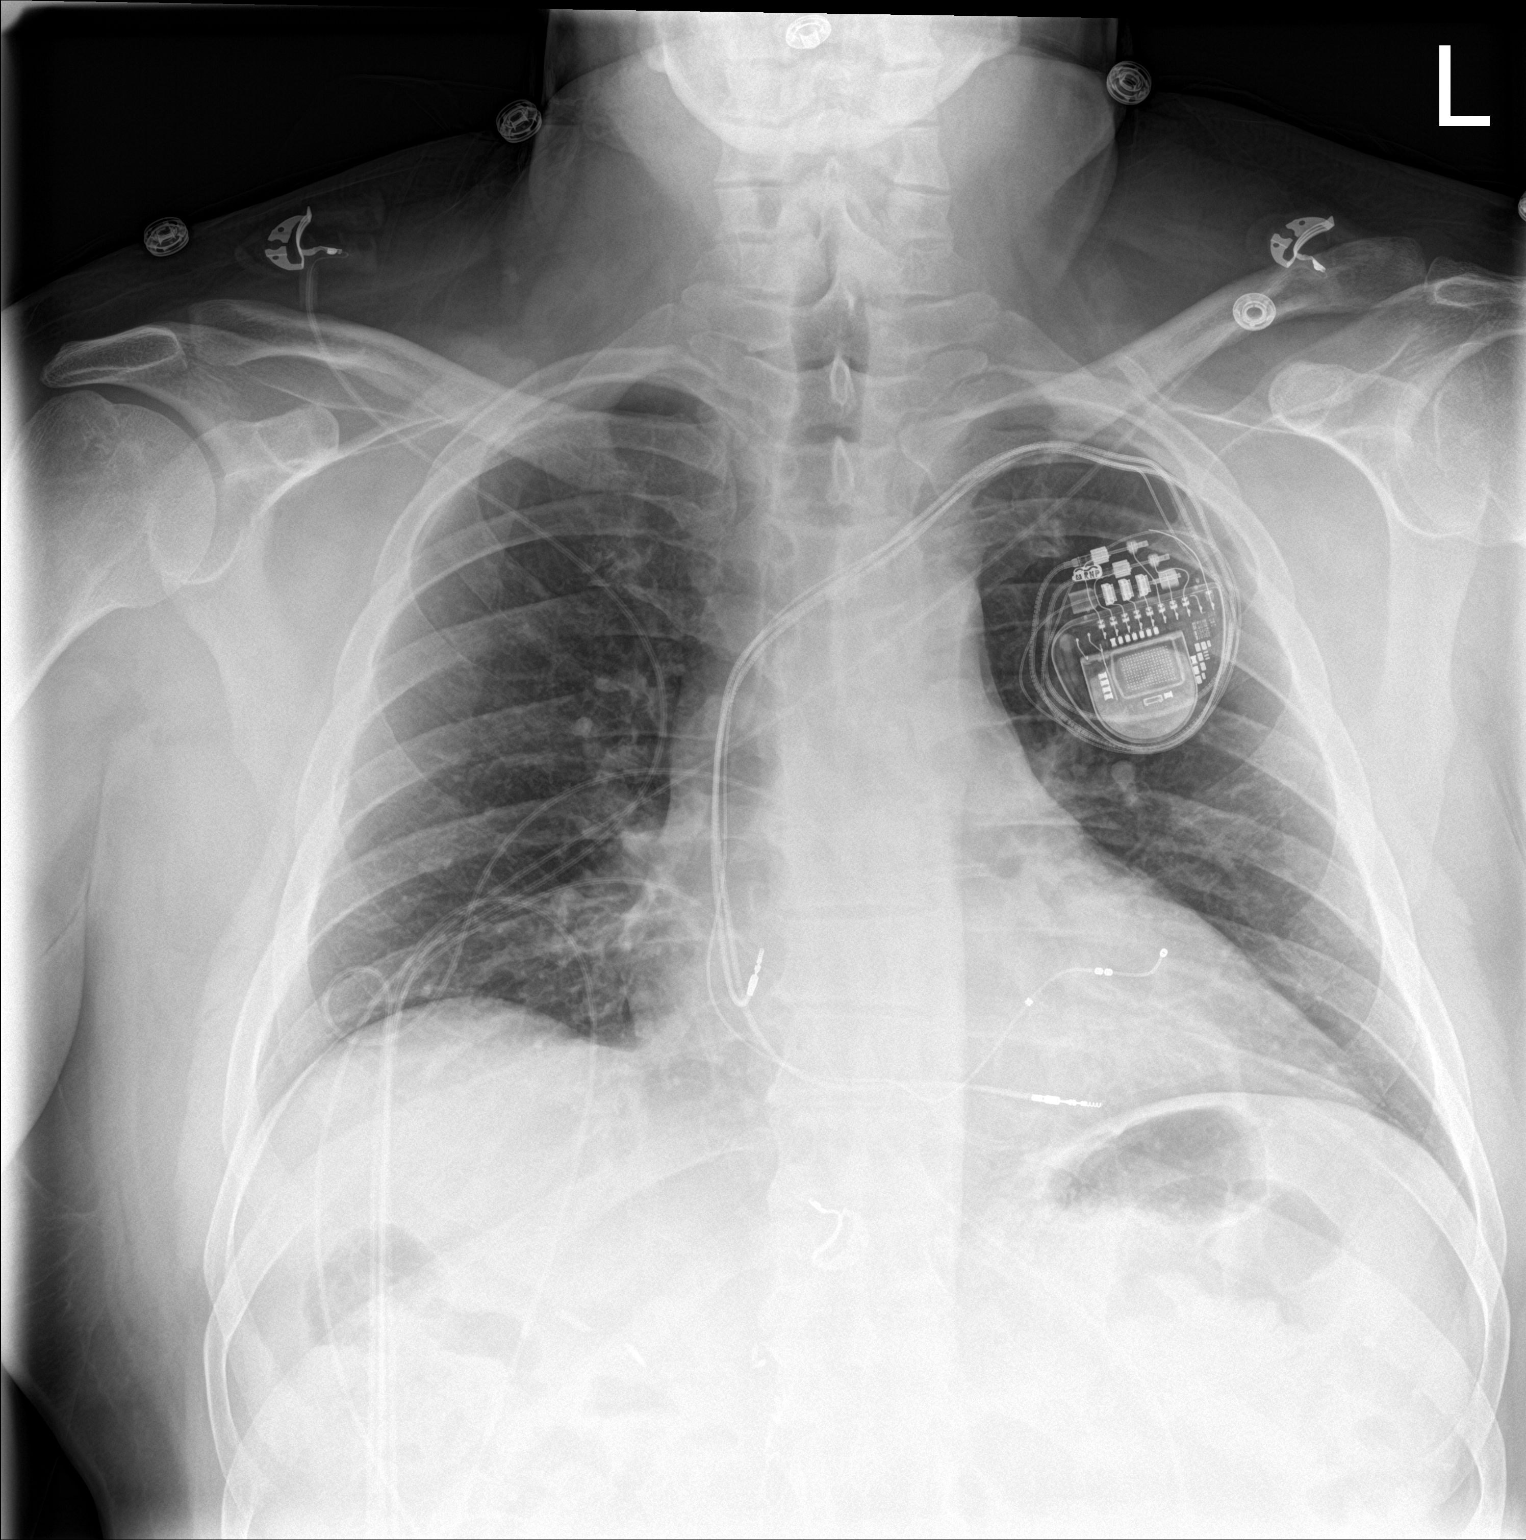

[chest lat]
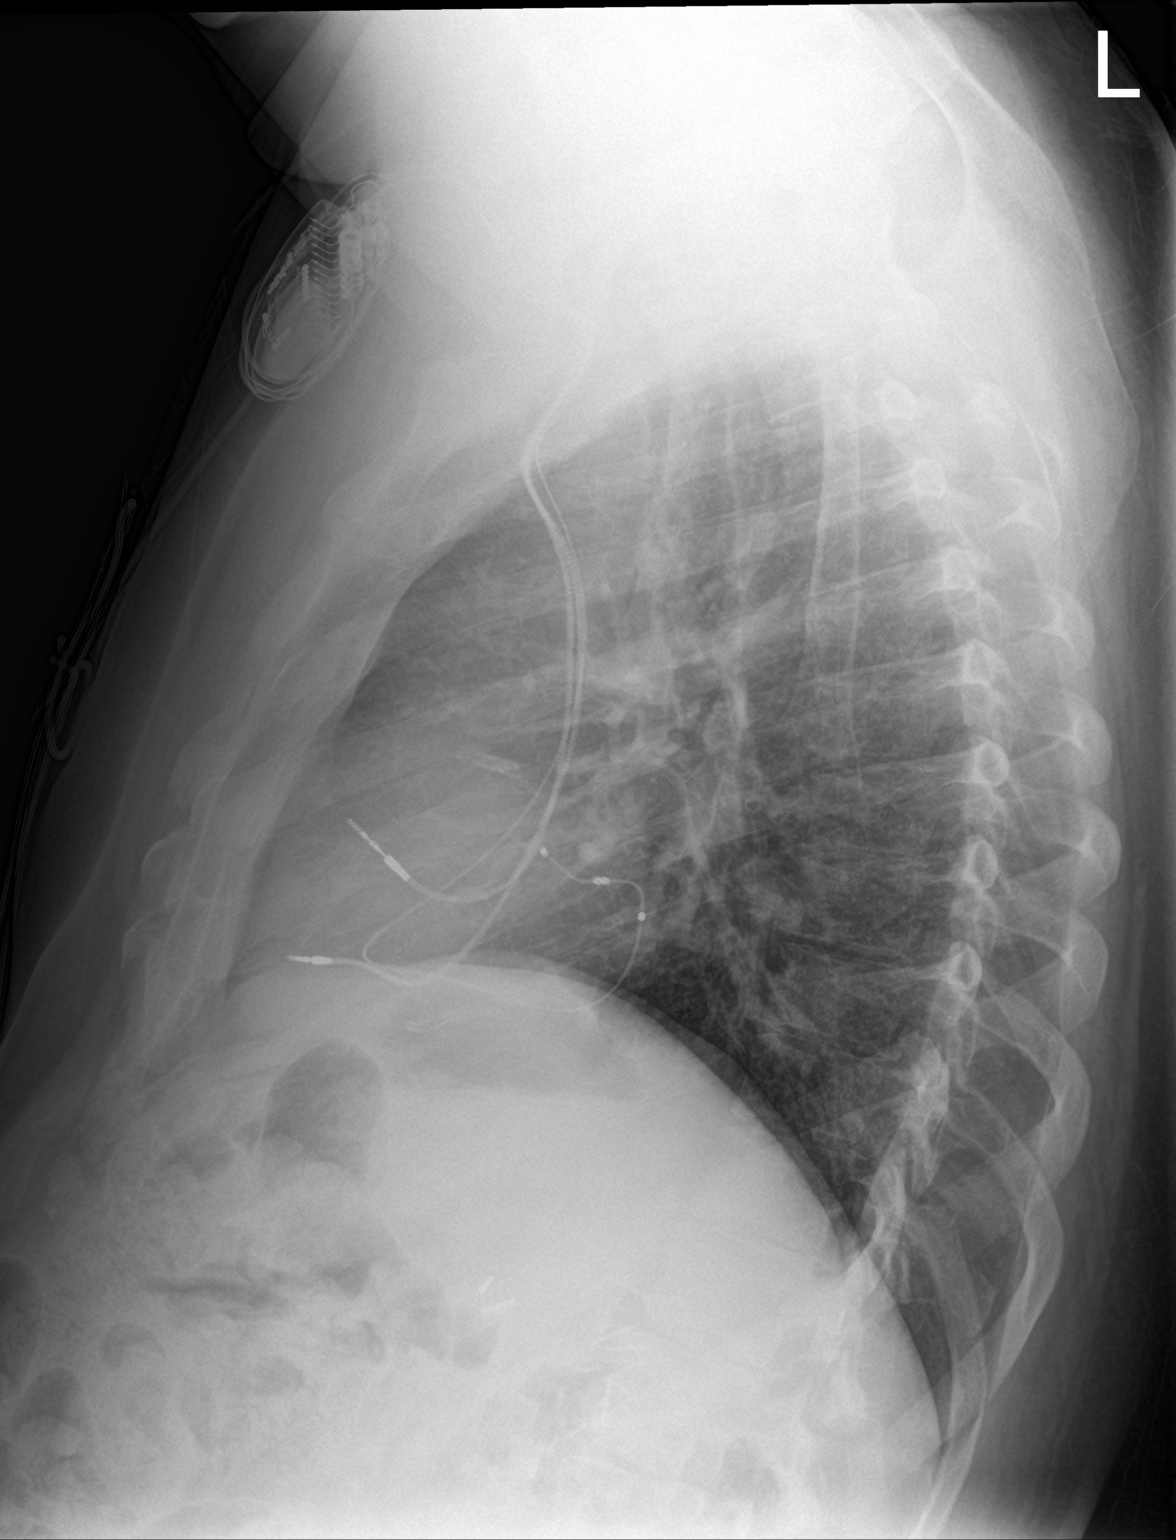

[2 of 2 positions shown; findings below may reference images not displayed]

FINDINGS: Lungs are clear. Heart size and pulmonary vascularity are normal.
Pacemaker leads are attached to the right atrium, right ventricle,
and coronary sinus. There is calcification in the left anterior
descending coronary artery. No adenopathy. No pneumothorax. No bone
lesions.
IMPRESSION: Lungs clear. Heart size normal. Left anterior descending coronary
artery calcification noted. Pacemaker leads attached to right
atrium, right ventricle, and coronary sinus.

## 2021-12-04 IMAGING — DX DG CHEST 1V PORT
1 series · 1 of 1 positions shown · non-contrast
Comparison: 08/25/2019

CLINICAL DATA: Assess chest tube post CABG.

EXAM:
PORTABLE CHEST 1 VIEW

[chest ap]
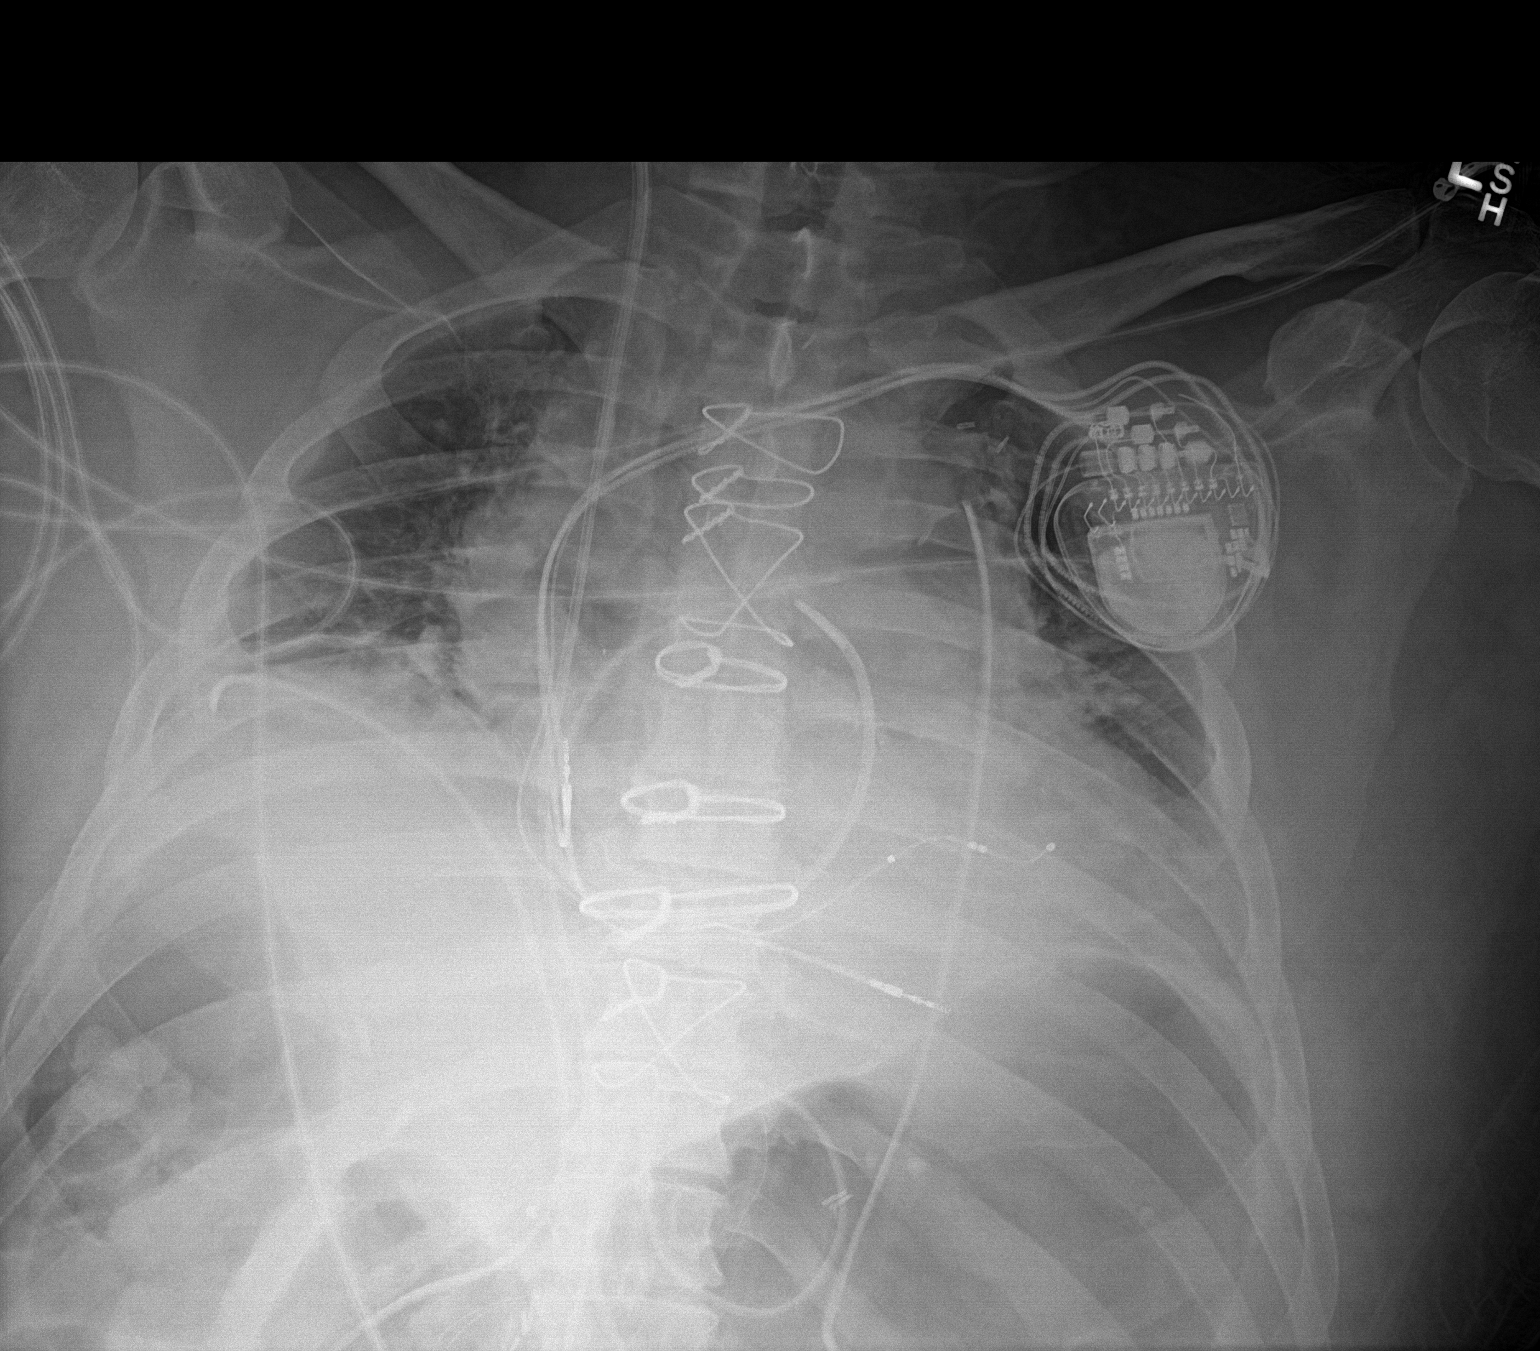

[1 of 1 positions shown; findings below may reference images not displayed]

FINDINGS: Sternotomy wires unchanged. Left-sided pacemaker unchanged. Interval
removal of endotracheal tube and nasogastric tube. Right IJ
Swan-Ganz catheter is unchanged with tip over the main pulmonary
artery segment. Bilateral chest tubes unchanged.

Lungs are hypoinflated with minimal bibasilar opacification likely
atelectasis. There is mild prominence of the central perihilar
markings likely mild degree of vascular congestion versus due to the
hypoinflation. No pneumothorax. Stable cardiomegaly. Remainder of
the exam is unchanged.
IMPRESSION: 1. Inflation with mild bibasilar atelectasis. Possible mild vascular
congestion with stable cardiomegaly.

2.  Tubes and lines as described.

## 2021-12-05 IMAGING — DX DG CHEST 1V PORT
1 series · 1 of 1 positions shown · non-contrast
Comparison: Portable exam 0352 hours compared to 08/26/2019

CLINICAL DATA: Post CABG

EXAM:
PORTABLE CHEST 1 VIEW

[chest ap]
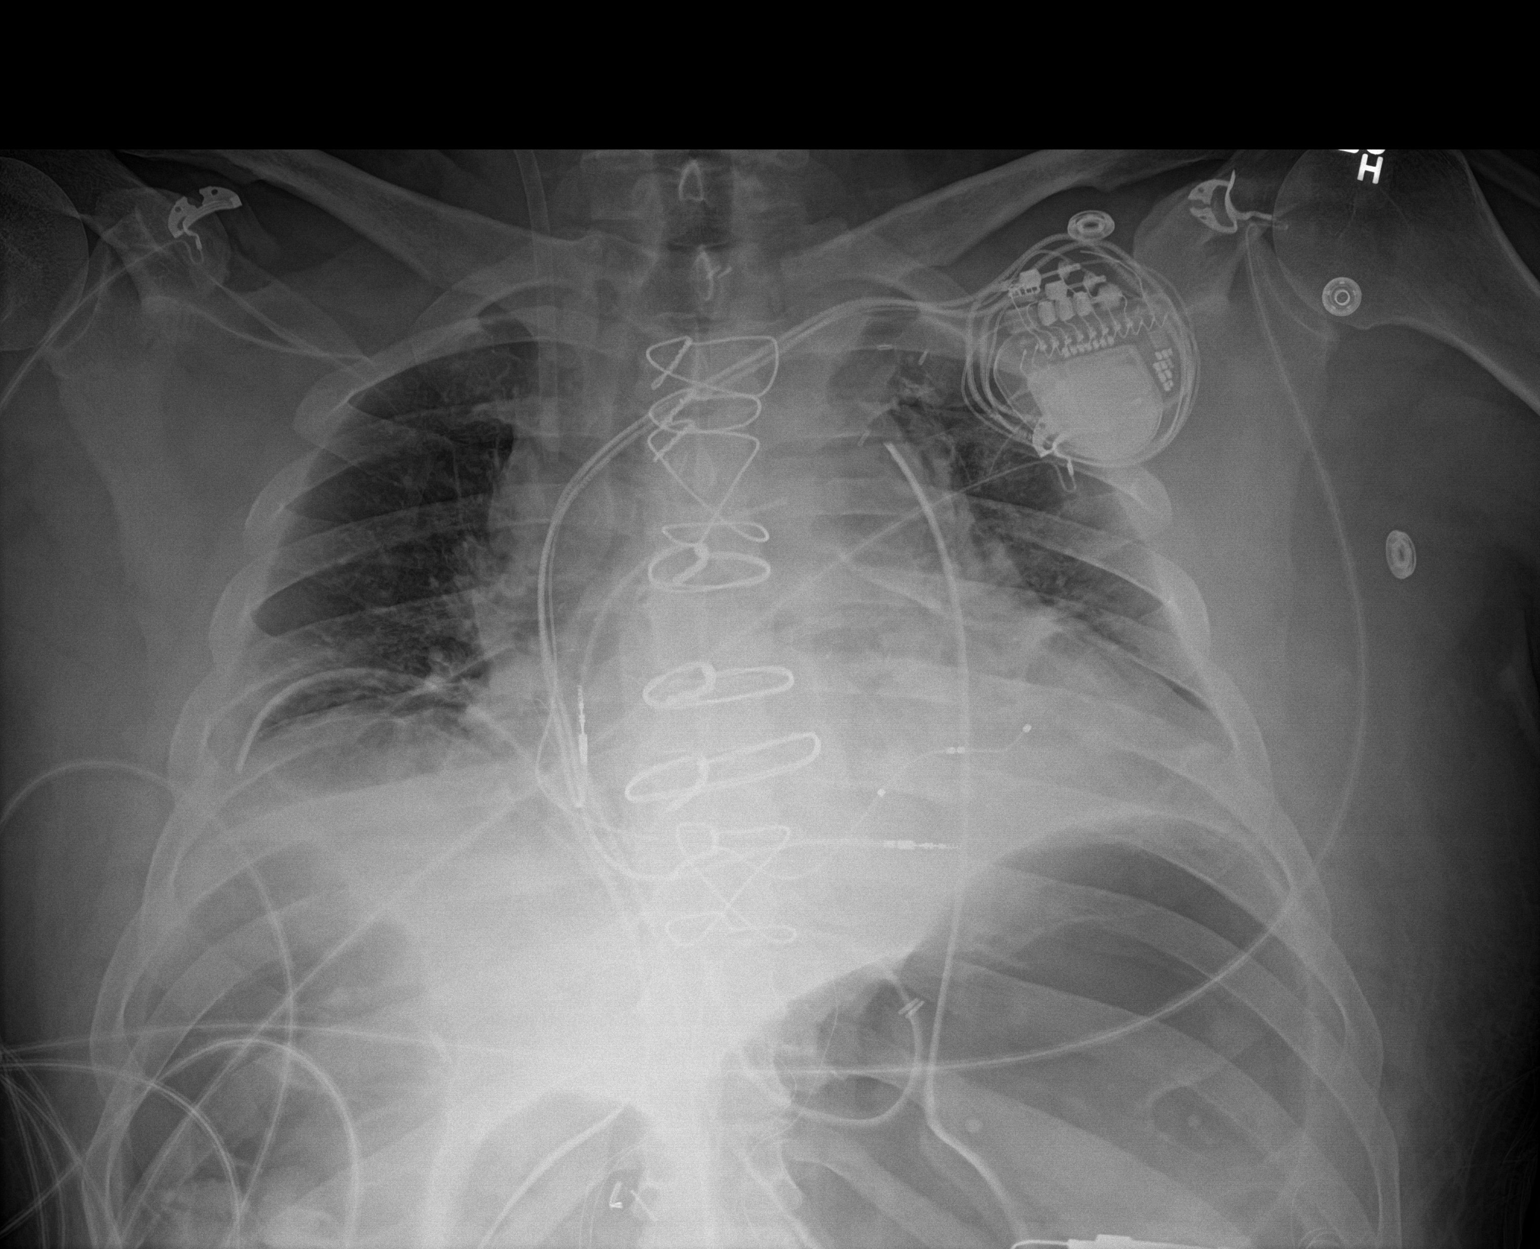

[1 of 1 positions shown; findings below may reference images not displayed]

FINDINGS: RIGHT thoracostomy tube and mediastinal drain unchanged.

RIGHT jugular Cordis catheter with tip projecting over proximal SVC.

LEFT subclavian pacemaker with leads projecting over RIGHT atrium,
RIGHT ventricle and coronary sinus.

Epicardial pacing wires noted.

Enlargement of cardiac silhouette post median sternotomy.

Low lung volumes with bibasilar atelectasis.

No definite infiltrate or pneumothorax.

Gaseous distention of stomach with air-filled bowel loops in the
RIGHT upper quadrant as well.
IMPRESSION: Low lung volumes with bibasilar atelectasis.

Gaseous distention of stomach.

## 2021-12-06 ENCOUNTER — Emergency Department (HOSPITAL_BASED_OUTPATIENT_CLINIC_OR_DEPARTMENT_OTHER)
Admission: EM | Admit: 2021-12-06 | Discharge: 2021-12-06 | Disposition: A | Discharge status: Home / Self Care | Payer: Medicare Other | Attending: Emergency Medicine | Admitting: Emergency Medicine

## 2021-12-06 ENCOUNTER — Other Ambulatory Visit: Payer: Self-pay

## 2021-12-06 ENCOUNTER — Encounter (HOSPITAL_BASED_OUTPATIENT_CLINIC_OR_DEPARTMENT_OTHER): Payer: Self-pay

## 2021-12-06 DIAGNOSIS — Z79899 Other long term (current) drug therapy: Secondary | ICD-10-CM | POA: Diagnosis not present

## 2021-12-06 DIAGNOSIS — Z794 Long term (current) use of insulin: Secondary | ICD-10-CM | POA: Insufficient documentation

## 2021-12-06 DIAGNOSIS — Z7902 Long term (current) use of antithrombotics/antiplatelets: Secondary | ICD-10-CM | POA: Diagnosis not present

## 2021-12-06 DIAGNOSIS — Z91040 Latex allergy status: Secondary | ICD-10-CM | POA: Insufficient documentation

## 2021-12-06 DIAGNOSIS — X58XXXD Exposure to other specified factors, subsequent encounter: Secondary | ICD-10-CM | POA: Diagnosis not present

## 2021-12-06 DIAGNOSIS — S81801D Unspecified open wound, right lower leg, subsequent encounter: Secondary | ICD-10-CM | POA: Insufficient documentation

## 2021-12-06 DIAGNOSIS — Z4800 Encounter for change or removal of nonsurgical wound dressing: Secondary | ICD-10-CM | POA: Diagnosis present

## 2021-12-06 DIAGNOSIS — E119 Type 2 diabetes mellitus without complications: Secondary | ICD-10-CM | POA: Diagnosis not present

## 2021-12-06 DIAGNOSIS — Z5189 Encounter for other specified aftercare: Secondary | ICD-10-CM

## 2021-12-06 IMAGING — DX DG CHEST 1V PORT
1 series · 1 of 1 positions shown · non-contrast
Comparison: Chest radiograph from one day prior.

CLINICAL DATA: Open cardiac surgery

EXAM:
PORTABLE CHEST 1 VIEW

[chest]
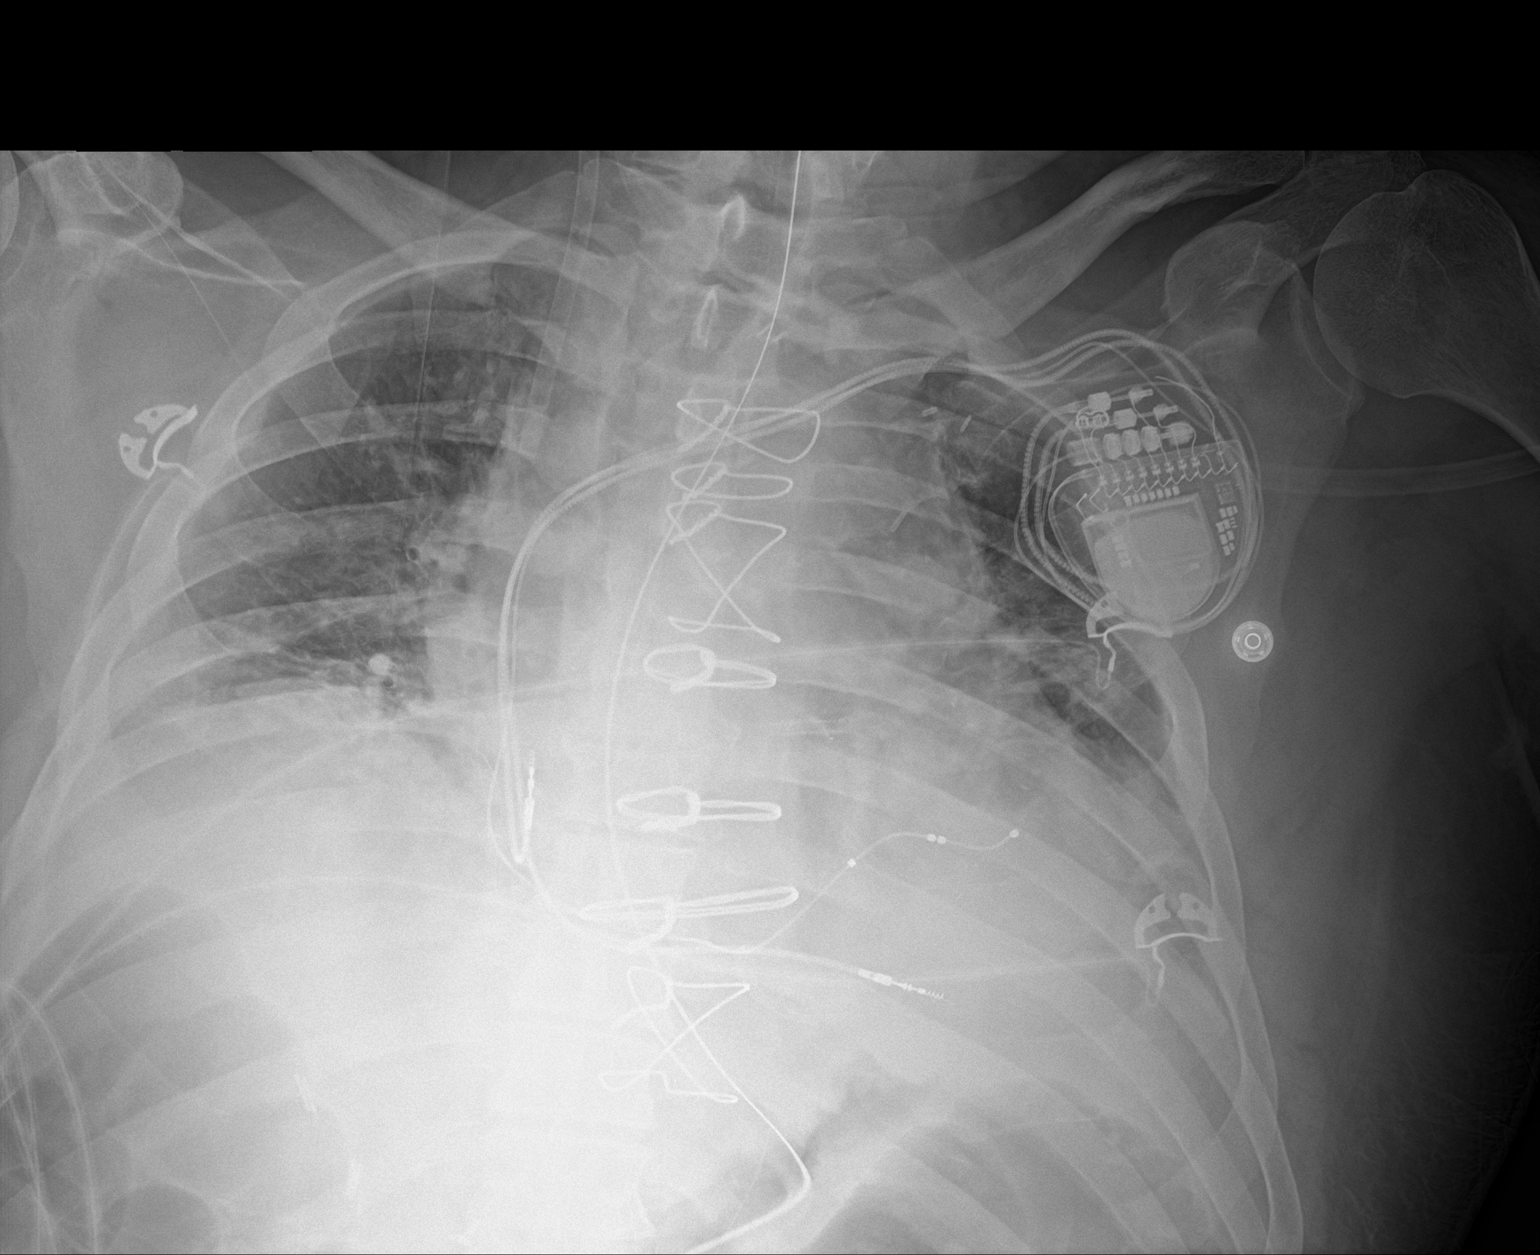

[1 of 1 positions shown; findings below may reference images not displayed]

FINDINGS: Interval chest tube removal. Stable configuration of 3 lead left
subclavian pacemaker. Intact sternotomy wires. Enteric tube enters
stomach with the tip not seen on this image. Right internal jugular
central venous sheath is stable. Stable cardiomediastinal silhouette
with mild cardiomegaly. No pneumothorax. No pleural effusion. Low
lung volumes with moderate bibasilar atelectasis. No overt pulmonary
edema.
IMPRESSION: 1. No pneumothorax.
2. Low lung volumes with moderate bibasilar atelectasis.
3. Stable mild cardiomegaly without overt pulmonary edema.

## 2021-12-06 MED ORDER — CEPHALEXIN 500 MG PO CAPS: 500.0000 mg | ORAL_CAPSULE | Freq: Four times a day (QID) | ORAL | 0 refills | Status: AC

## 2021-12-06 NOTE — ED Triage Notes (Signed)
Patient here POV from Home.  Endorses Wound on Medial Lower Right Leg from Possible Insect Bite. Originally noted approximately 1 Month ago but has progressively become more swollen and painful.  No Fevers. Some Drainage a few days ago.   NAD Noted during Triage. A&Ox4. GCS 15. Ambulatory.

## 2021-12-06 NOTE — ED Provider Notes (Signed)
[facility] EMERGENCY DEPT Provider Note   CSN: [id number] Arrival date & time: 12/06/21  2047     History DM Chief Complaint  Patient presents with   Wound Check    John Dodson is a 61 y.o. male.  61 y.o male with a PMH of DM presents to the ED with a chief complaint of right lower leg wound x 1 month. Patient reports he thinks likely bit by a spider, the wound began like a small bite but has now progressively gotten worse. He was evaluated in an outpatient setting and took a course of antibiotics, although he is not sure which abx this was. He is reporting more pain to the area along with some chills and diarrhea. He has not applied any medication for improvement in symptoms. His blood sugar have been normal at home. No other complaints.   The history is provided by the patient and medical records.  Wound Check       Home Medications Prior to Admission medications   Medication Sig Start Date End Date Taking? Authorizing Provider  cephALEXin (KEFLEX) 500 MG capsule Take 1 capsule (500 mg total) by mouth 4 (four) times daily for 7 days. 12/06/21 12/13/21 Yes Savas Elvin, PA-C  albuterol (VENTOLIN HFA) 108 (90 Base) MCG/ACT inhaler Inhale 2 puffs into the lungs every 4 (four) hours as needed for wheezing or shortness of breath.    [provider]  allopurinol (ZYLOPRIM) 300 MG tablet Take 300 mg by mouth daily. 03/17/20   [provider]  amLODipine (NORVASC) 5 MG tablet Take 1 tablet (5 mg total) by mouth daily. 12/17/19   Evans Lance, MD  atorvastatin (LIPITOR) 40 MG tablet Take 1 tablet (40 mg total) by mouth daily. 12/17/19   Evans Lance, MD  carvedilol (COREG) 12.5 MG tablet Take 1 tablet (12.5 mg total) by mouth 2 (two) times daily with a meal. 12/17/19   Evans Lance, MD  clopidogrel (PLAVIX) 75 MG tablet Take 1 tablet (75 mg total) by mouth daily. Please keep scheduled appointment in Nov for further refills. 02/27/21   Evans Lance, MD  colchicine 0.6 MG tablet Take 0.6 mg by mouth as needed (gout flare ups). 11/30/19   [provider]  fluticasone (FLONASE) 50 MCG/ACT nasal spray Place 2 sprays into both nostrils as needed for congestion. 11/03/20   [provider]  furosemide (LASIX) 40 MG tablet Take 1 tablet (40 mg total) by mouth daily as needed for fluid or edema (Poor more than 3 pound weight gain overnight or more than 5 pound weight gain over baseline). 12/17/19   Evans Lance, MD  insulin glargine (LANTUS SOLOSTAR) 100 UNIT/ML Solostar Pen Inject 50 Units into the skin every evening. 100UNITS/3 ML    [provider]  JARDIANCE 25 MG TABS tablet Take 25 mg by mouth daily. 05/11/20   [provider]  losartan (COZAAR) 25 MG tablet Take 12.5 mg by mouth daily. 01/15/21   [provider]  MAGNESIUM OXIDE PO Take 500 mg by mouth daily. TAKE 1  TABLET BY MOUTH NIGHTLY FOR LEG CRAMPS    [provider]  nitroGLYCERIN (NITROSTAT) 0.4 MG SL tablet Place 1 tablet (0.4 mg total) under the tongue every 5 (five) minutes as needed for chest pain. 05/16/21   Leonie Man, MD  Omega-3 Fatty Acids (FISH OIL BURP-LESS) 1000 MG CAPS Take 1,000 mg by mouth 2 (two) times daily.    [provider]  OZEMPIC, 0.25 OR 0.5 MG/DOSE, 2 MG/1.5ML SOPN Inject 0.5 mg into the skin once a week. 01/31/21   [provider]  pantoprazole (PROTONIX) 40 MG tablet Take 40 mg by mouth daily. 12/09/13   [provider]  traMADol (ULTRAM) 50 MG tablet Take 50 mg by mouth every 6 (six) hours as needed for pain. 11/30/19   [provider]      Allergies    Latex, Codeine, Contrast media [iodinated contrast media], Integrilin [eptifibatide], Tylenol [acetaminophen], Zithromax [azithromycin], and Glipizide    Review of Systems   Review of Systems  Constitutional:  Positive for chills. Negative for fever.  Skin:  Positive for wound.    Physical Exam Updated Vital  Signs BP (!) 143/76 (BP Location: Right Arm)   Pulse 82   Temp 97.9 F (36.6 C) (Temporal)   Resp 16   Ht '5\' 8"'$  (1.727 m)   Wt 93.9 kg   SpO2 100%   BMI 31.47 kg/m  Physical Exam Vitals and nursing note reviewed.  Constitutional:      Appearance: Normal appearance.  HENT:     Head: Normocephalic and atraumatic.     Mouth/Throat:     Mouth: Mucous membranes are moist.  Pulmonary:     Effort: Pulmonary effort is normal.  Abdominal:     General: Abdomen is flat.  Musculoskeletal:     Cervical back: Normal range of motion and neck supple.  Skin:    General: Skin is warm and dry.     Findings: Erythema present.  Neurological:     Mental Status: He is alert and oriented to person, place, and time.        ED Results / Procedures / Treatments   Labs (all labs ordered are listed, but only abnormal results are displayed) Labs Reviewed - No data to display  EKG None  Radiology No results found.  Procedures Procedures    Medications Ordered in ED Medications - No data to display  ED Course/ Medical Decision Making/ A&P                           Medical Decision Making    Patient here for wound check x 1 month. Reports prior tx with abx however with no improvement in symptoms. Endorsing chills and diarrhea for the last couple of days but afebrile.   Wound without any active drainage. He is unsure which antibiotic he took in the past. Blood sugar normal while at home. We discussed outpatient trial of abx along with close follow up. Suspect cellulitis at this time, treating with keflex for a 7 day course. Patient stable for discharge.    Portions of this note were generated with Lobbyist. Dictation errors may occur despite best attempts at proofreading.   Final Clinical Impression(s) / ED Diagnoses Final diagnoses:  Visit for wound check    Rx / DC Orders ED Discharge Orders          Ordered    cephALEXin (KEFLEX) 500 MG capsule  4 times  daily        12/06/21 2118              Janeece Fitting, PA-C 12/06/21 2126    Lennice Sites, DO 12/06/21 2210

## 2021-12-06 NOTE — Discharge Instructions (Signed)
You were prescribed antibiotics on todays visit, please take 1 tablet four times a day for the next 7 days.   Please have your wound rechecked in 3 days by your primary care physician, Urgent care or ED.

## 2021-12-06 NOTE — ED Notes (Signed)
Patient verbalizes understanding of discharge instructions. Opportunity for questioning and answers were provided. Armband removed by staff, pt discharged from ED. Ambulated out to lobby  

## 2021-12-14 ENCOUNTER — Ambulatory Visit (INDEPENDENT_AMBULATORY_CARE_PROVIDER_SITE_OTHER): Payer: Medicare Other

## 2021-12-14 DIAGNOSIS — I442 Atrioventricular block, complete: Secondary | ICD-10-CM | POA: Diagnosis not present

## 2021-12-14 LAB — CUP PACEART REMOTE DEVICE CHECK
Battery Remaining Longevity: 28 mo
Battery Voltage: 2.91 V
Brady Statistic AP VP Percent: 0.35 %
Brady Statistic AP VS Percent: 0 %
Brady Statistic AS VP Percent: 99.64 %
Brady Statistic AS VS Percent: 0.01 %
Brady Statistic RA Percent Paced: 0.34 %
Brady Statistic RV Percent Paced: 99.99 %
Date Time Interrogation Session: 20230818021742
Implantable Lead Implant Date: 20180315
Implantable Lead Implant Date: 20180315
Implantable Lead Implant Date: 20180315
Implantable Lead Location: 753858
Implantable Lead Location: 753859
Implantable Lead Location: 753860
Implantable Lead Model: 4076
Implantable Lead Model: 4298
Implantable Lead Model: 5076
Implantable Pulse Generator Implant Date: 20180315
Lead Channel Impedance Value: 285 Ohm
Lead Channel Impedance Value: 304 Ohm
Lead Channel Impedance Value: 399 Ohm
Lead Channel Impedance Value: 418 Ohm
Lead Channel Impedance Value: 418 Ohm
Lead Channel Impedance Value: 437 Ohm
Lead Channel Impedance Value: 475 Ohm
Lead Channel Impedance Value: 494 Ohm
Lead Channel Impedance Value: 513 Ohm
Lead Channel Impedance Value: 760 Ohm
Lead Channel Impedance Value: 779 Ohm
Lead Channel Impedance Value: 779 Ohm
Lead Channel Impedance Value: 798 Ohm
Lead Channel Impedance Value: 836 Ohm
Lead Channel Pacing Threshold Amplitude: 1.125 V
Lead Channel Pacing Threshold Amplitude: 1.25 V
Lead Channel Pacing Threshold Amplitude: 2.5 V
Lead Channel Pacing Threshold Pulse Width: 0.4 ms
Lead Channel Pacing Threshold Pulse Width: 0.4 ms
Lead Channel Pacing Threshold Pulse Width: 0.9 ms
Lead Channel Sensing Intrinsic Amplitude: 1.25 mV
Lead Channel Sensing Intrinsic Amplitude: 1.25 mV
Lead Channel Sensing Intrinsic Amplitude: 19.75 mV
Lead Channel Sensing Intrinsic Amplitude: 19.75 mV
Lead Channel Setting Pacing Amplitude: 2.5 V
Lead Channel Setting Pacing Amplitude: 2.75 V
Lead Channel Setting Pacing Amplitude: 3 V
Lead Channel Setting Pacing Pulse Width: 0.4 ms
Lead Channel Setting Pacing Pulse Width: 0.9 ms
Lead Channel Setting Sensing Sensitivity: 4 mV

## 2022-01-03 NOTE — Progress Notes (Signed)
Cardiology Office Note Date:  01/04/2022  Patient ID:  John Dodson, John Dodson 01-17-1961, MRN [id number] PCP:  Kristopher Glee., MD  Cardiologist:  Dr. Ellyn Hack Electrophysiologist: Dr. Lovena Le    Chief Complaint:  needs Device programming  History of Present Illness: John Dodson is a 61 y.o. male with history of CAD (CABG 2021), CHB w/PPM, HTN, HLD, PE (associated/post COVID infection 2021), DM, CKD (III)   He last saw EP service 03/20/21 with Jonni Sanger, doing well, though described some positional spinning dizziness, advised to f/u with his PMD, no changes were made.  He last saw cardiology, J. Quentin Ore, PA-C 11/01/21, prior c/o CP remained resolved, walking 3-4 days/week about 15 minutes.  Using some s/l NTG. Occasional positional dizziness, using PRN lasix.  CP seemed to be more c/w incisional/scar pain/sensitivity from his CABG No changes were made. Discussed heart healthy habits.   TODAY He is doing pretty well. If we had not called he would not have reached out with any concerns. He denies any new symptoms, no SOB No unusual or different CP from his prior visits. No changes in his exertional capacity He does not exercise formally but stays busy. No near syncope or syncope. He will infrequently feel a little lightheaded upon standing  He has labs regularly via his PMD and nephrologist (Dr. Olivia Mackie)   Device information MDT CRT-P implanted 07/11/2016   Past Medical History:  Diagnosis Date   Blepharitis of both eyes    Chronic   Cataracts, both eyes    CKD (chronic kidney disease) stage 3, GFR 30-59 ml/min (HCC)    s/p R Nephrectomy/adrenalectomy & DM-Nephropathy (baseline creatinine 1.4-1.5)   COVID-19 virus infection 04/25/2019   Diabetic peripheral neuropathy associated with type 2 diabetes mellitus ([place])    Essential hypertension    GERD without esophagitis    History of basal cell carcinoma (BCC) excision    History of complete heart block 06/2016   s/p  BiV PPM (now followed by Dr. Merlyn Lot - EP)   History of non-ST elevation myocardial infarction (NSTEMI)    And several occasions of unstable angina   Hx of CABGx4 08/25/2019   (Dr. Briant Cedar, [facility]): LIMA-LAD, Seq Rad- OM2-LPDA, RIMA-? OM1 (called Ramus on report, but no ramus seen on cath).   Hyperlipidemia associated with type 2 diabetes mellitus ([place])    on atorvastatin 40 mg.   Multiple Vessel CAD -- s/p PCI of LAD, OM1, LPL1, LPDA & non-dominant RCA --> now S/P CABG x 4 60%   (No cath report prior to 2016) (stents in proxLAD, prox OM1 x 2, prox LPL1 & 3-4 in L PDA & ~2 in  Known non-dominant RCA;  07/2019: MV CAD - patent stents in p-m LAD, p OM1, p LPL & LPDA as well as RCA -> Ost LCx ~60-70% (DFR ++), long mLAD 60% post-stent (DFR ++) --> referred for CABG   Obesity (BMI 30.0-34.9)    Occlusion of left vertebral artery    Consider repossible acute lesion in August 2020 with presentation of headache.  CTA suggested occluded left vertebral artery throughout the neck.  Faint string-like enhancement intermittently visible suggesting recent occlusion.  Partial reconstruction and posterior fossa.  Left PICA patent.  (Consider possible left vertebral artery dissection)   OSA treated with BiPAP 02/2019   Diagnosis in late 2020 ([facility])--> delayed onset of treatment with BiPAP due to Covid hospitalization followed by PE. 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  (On warfarin plus Plavix now with aspirin stopped.)   Type 2 diabetes mellitus with complication, with long-term current use of insulin ([place])    On Lantus, Jardiance, Metformin & Onglyza    Past Surgical History:  Procedure Laterality Date   BASAL CELL CARCINOMA EXCISION  01/2015   CAROTID DUPLEX SCAN  12/25/2018   [facility]: Mild plaque in both carotids.  139% bilateral.  Right  vertebral flow normal antegrade, Left not seen; normal bilateral subclavian flow..   CHOLECYSTECTOMY     CORONARY ARTERY BYPASS GRAFT N/A 08/25/2019   Procedure: CORONARY ARTERY BYPASS GRAFTING (CABG) TIMES FOUR, ON PUMP, USING LEFT AND RIGHT INTERNAL MAMMARY ARTERY AND LEFT RADIAL ARTERY;  Surgeon: [name], MD;  Location: [facility] OR;  Service: Open Heart Surgery;; LIMA-LAD, Seq Rad OM2-LPDA, RIMA-OM1   CORONARY STENT INTERVENTION  04/10/2015   ([place]; Bishop Limbo, DO) -> DES PCI mLPDA: Xience Alpine DES 2.5 mm x 18 mm, Xience Alpine DES 2.25 mm x 12 mm overlapping.   CORONARY STENT INTERVENTION  04/04/2016   ([place]; Plessis, MD)--> (urgent) 100% mLPDA PTCA with 2.25 mm balloon - reduced to 50%; mid OM2 90% - DES PCI (Xience DES  2.5 x 18).    CORONARY STENT INTERVENTION  2015   Prior to December 2016, STENTS noted in prox LAD, proximal OM1, and at least 2 stents in proximal L PDA   CORONARY STENT INTERVENTION  11/17/2018   ([place]; Bishop Limbo, DO): CULPRIT: mid RCA 90% (DES PCI) -Resolute Onyx DES 2.5 mm x 30 mm --> COMPLICATION: Large left arm hematoma-evaluated by vascular and orthopedic surgery, managed with splint and arm elevation.   CORONARY STENT INTERVENTION  12/28/2018   ([place]; Clarene Critchley, MD, referred by Dr. Mathis Bud) --> INDICATION (urgent, Unstable Angina): Moderate-severe (70%) stenosis of ostial RCA  -> DES PCI Resolute Onyx DES 2.5 mm x 12 mm.    LEFT HEART CATH AND CORONARY ANGIOGRAPHY  04/10/2015   ([place]; Bishop Limbo, DO)--> EF 55%.  Mild inferior HK.  Coronaries-LM: Normal, p LAD STENT 30% ISR, mLAD 20% & diffuse dLAD ~20%; Dom LCx: mCx 20%, ~pOM1 STENT (noted as OM2 in other reports) patent with mid 30%, OM2 normal, prox LPDA "STENTS" patent with SEVERE mid L PDA 90-95% (DES PCI x 2 overlapping); Small-non-dominant RCA  patent    LEFT HEART CATH AND CORONARY ANGIOGRAPHY   04/04/2016   ([place]; Imbler, MD)--> (urgent): EF 70%.  Previous LAD,~OM2 and proximal PDA stents patent; -> mLAD 35%, dLAD 40%; LCx-OM1 75% (short lesion, med Rx), mid OM2 90% (DES PCI), pLPDA stents patent w/ mPDA 100% (PTCA only - reduced to 50%); non-dom RCA - ost RCA 60% & mRCA 75%   LEFT HEART CATH AND CORONARY ANGIOGRAPHY  11/17/2018   ([place]; Bishop Limbo, DO) indication: Angina.  LM normal; LAD - ost LAD ~50%, pLAD STENT ~30% ISR with dLAD ~40%; Dom LCx - ost & prox Cx 30%, OM1 STENT 20% ISR, OM2 STENT 50% distal edge, pLPDA overlapping STENTS ~20%; nonDom RCA - proxRCA 40%, mid 90% (DES PCI)   LEFT HEART CATH AND CORONARY ANGIOGRAPHY  12/28/2018   ([place]; Clarene Critchley, MD, referred by Dr. Jeneen Rinks McGukin) --> INDICATION (urgent, Unstable Angina): Moderate-severe (70%) stenosis of ostial RCA (DES PCI), mRCA  30%.  Otherwise no significant change from July 2020: dLM 20%, pLAD ~40% ISR , dLAD long/diffuse ~50%; OstLCx 30%, OM1 ~15% OM2 ~30% with patent  LPDA stents/PTCA site.    LEFT HEART CATH AND CORONARY ANGIOGRAPHY N/A 08/20/2019   Procedure: LEFT HEART CATH AND CORONARY ANGIOGRAPHY;  Surgeon: Leonie Man, MD;  Location: [facility];  Service: Cardiovascular;; Multivessel CAD: Patent stents in proximal to mid LAD, proximal OM1, proximal LPL 1 and several stents in L PDA as well as RCA (nondominant).  Ostial LCx 60%-highly DFR +0.84.  There is a long mid LAD 60% stenosis after the stent--highly DFR +0.67.--> CABG   NEPHRECTOMY Right 2012   With adrenalectomy   NM MYOVIEW LTD  08/20/2018   [facility] -> Lexiscan Myoview: EF 81%.  No reversible ischemia or infarction.  Normal wall motion.   PACEMAKER IMPLANT  06/2016   [facility], [place] ([employer]) -> according to CT of chest, leads positioned in right atrium, cardiac apex and coronary sinus (suggesting CRT-P - BiV Pacing))   RADIAL ARTERY HARVEST Left  08/25/2019   Procedure: RADIAL ARTERY HARVEST;  Surgeon: [name], MD;  Location: [facility];  Service: Open Heart Surgery;  Laterality: Left;   TEE WITHOUT CARDIOVERSION N/A 08/25/2019   Procedure: TRANSESOPHAGEAL ECHOCARDIOGRAM (TEE);  Surgeon: [name], MD;  Location: [facility];  Service: Open Heart Surgery;  Laterality: N/A;   TRANSTHORACIC ECHOCARDIOGRAM  11/2018; 05/01/2019   ([place]) a) EF 60-65%.  Mild TR.;; b)moderate concentric LVH.  EF 55 to 60%.  No R WMA.  Normal RV size and function.  Normal atrial sizes.  Normal valves.    Current Outpatient Medications  Medication Sig Dispense Refill   albuterol (VENTOLIN HFA) 108 (90 Base) MCG/ACT inhaler Inhale 2 puffs into the lungs every 4 (four) hours as needed for wheezing or shortness of breath.     allopurinol (ZYLOPRIM) 300 MG tablet Take 300 mg by mouth daily.     amLODipine (NORVASC) 5 MG tablet Take 1 tablet (5 mg total) by mouth daily. 90 tablet 3   atorvastatin (LIPITOR) 40 MG tablet Take 1 tablet (40 mg total) by mouth daily. 90 tablet 3   carvedilol (COREG) 12.5 MG tablet Take 1 tablet (12.5 mg total) by mouth 2 (two) times daily with a meal. 180 tablet 3   clopidogrel (PLAVIX) 75 MG tablet Take 1 tablet (75 mg total) by mouth daily. Please keep scheduled appointment in Nov for further refills. 30 tablet 0   colchicine 0.6 MG tablet Take 0.6 mg by mouth as needed (gout flare ups).     fluticasone (FLONASE) 50 MCG/ACT nasal spray Place 2 sprays into both nostrils as needed for congestion.     furosemide (LASIX) 40 MG tablet Take 1 tablet (40 mg total) by mouth daily as needed for fluid or edema (Poor more than 3 pound weight gain overnight or more than 5 pound weight gain over baseline). 30 tablet 11   insulin glargine (LANTUS SOLOSTAR) 100 UNIT/ML Solostar Pen Inject 50 Units into the skin every evening. 100UNITS/3 ML     JARDIANCE 25 MG TABS tablet Take 25 mg by mouth daily.     losartan (COZAAR) 25 MG  tablet Take 12.5 mg by mouth daily.     MAGNESIUM OXIDE PO Take 500 mg by mouth daily. TAKE 1  TABLET BY MOUTH NIGHTLY FOR LEG CRAMPS     nitroGLYCERIN (NITROSTAT) 0.4 MG SL tablet Place  1 tablet (0.4 mg total) under the tongue every 5 (five) minutes as needed for chest pain. 25 tablet 6   Omega-3 Fatty Acids (FISH OIL BURP-LESS) 1000 MG CAPS Take 1,000 mg by mouth 2 (two) times daily.     OZEMPIC, 0.25 OR 0.5 MG/DOSE, 2 MG/1.5ML SOPN Inject 0.5 mg into the skin once a week.     pantoprazole (PROTONIX) 40 MG tablet Take 40 mg by mouth daily.     traMADol (ULTRAM) 50 MG tablet Take 50 mg by mouth every 6 (six) hours as needed for pain.     No current facility-administered medications for this visit.    Allergies:   Latex, Codeine, Contrast media [iodinated contrast media], Integrilin [eptifibatide], Tylenol [acetaminophen], Zithromax [azithromycin], and Glipizide   Social History:  The patient  reports that he has never smoked. He has never used smokeless tobacco. He reports that he does not drink alcohol and does not use drugs.   Family History:  The patient's family history includes Coronary artery disease in his father; Hyperlipidemia in his father and mother; Hypertension in his father and mother; Stroke in his mother.  ROS:  Please see the history of present illness.    All other systems are reviewed and otherwise negative.   PHYSICAL EXAM:  VS:  BP 100/60   Pulse 74   Ht '5\' 8"'$  (1.727 m)   Wt 205 lb 12.8 oz (93.4 kg)   SpO2 97%   BMI 31.29 kg/m  BMI: Body mass index is 31.29 kg/m. Well nourished, well developed, in no acute distress HEENT: normocephalic, atraumatic Neck: no JVD, carotid bruits or masses Cardiac:  RRR; no significant murmurs, no rubs, or gallops Lungs:  CTA b/l, no wheezing, rhonchi or rales Abd: soft, nontender MS: no deformity or atrophy Ext: no edema Skin: warm and dry, no rash Neuro:  No gross deficits appreciated Psych: euthymic mood, full affect  PPM  site is stable, no tethering or discomfort   EKG:  not done today  Device interrogation done today and reviewed by myself:  Battery and lead measurements are good LV lead thresholds particularly are consistent and stable today No arrhythmias He is device dependent LV lead capture management is changed to monitor   10/03/21: stress myoview  The study is normal. The study is low risk.   No ST deviation was noted.   LV perfusion is normal.   Left ventricular function is normal. Nuclear stress EF: 62 %. The left ventricular ejection fraction is normal (55-65%). End diastolic cavity size is normal.   Prior study available for comparison from 08/20/2018.   Low risk stress nuclear study with normal perfusion and normal left ventricular regional and global systolic function.     08/27/19: limited echo IMPRESSIONS  1. No significant pericardial effusion noted. Likely prominent prominent  epicardial fat pad. Given limited views - EF appears normal RV is normal  size.    08/21/19: TTE  1. Left ventricular ejection fraction, by estimation, is 55 to 60%. The  left ventricle has normal function. Left ventricular endocardial border  not optimally defined to evaluate regional wall motion. There is moderate  concentric left ventricular  hypertrophy. Left ventricular diastolic parameters are consistent with  Grade I diastolic dysfunction (impaired relaxation).   2. Right ventricular systolic function is normal. The right ventricular  size is normal. There is borderline elevated pulmonary artery systolic  pressure. The estimated right ventricular systolic pressure is 93.8 mmHg.   3. The mitral valve is  normal in structure. Trivial mitral valve  regurgitation. No evidence of mitral stenosis.   4. Tricuspid valve regurgitation is mild to moderate.   5. The aortic valve is normal in structure. Aortic valve regurgitation is  not visualized. No aortic stenosis is present.   6. The inferior vena cava  is normal in size with greater than 50%  respiratory variability, suggesting right atrial pressure of 3 mmHg.   Comparison(s): No prior Echocardiogram.    08/20/2019: [facility] The left ventricular systolic function is normal. The left ventricular ejection fraction is 55-65% by visual estimate. LV end diastolic pressure is normal.LV end diastolic pressure is normal. ------------------- There is stent from ostial to almost distal small nondominant RCA: Ost RCA to Mid RCA stent is diffusely 35% stenosed. Ost Cx to Prox Cx lesion is 60% stenosed. Ost LAD to Mid LAD stent is 10% stenosed. Mid LAD to Dist LAD lesion is 60% stenosed. Previously placed 2nd Mrg stent (DES) is widely patent. Lat 2nd Mrg lesion is 80% stenosed. Previously described. Previously placed 1st LPL stent (DES) is widely patent. LPAV lesion is 50% stenosed. LPDA stent is 5% stenosed.   Multivessel CAD with essentially patent stents in proximal to mid LAD, proximal OM1, proximal LPL1, several stents in the L PDA as well as RCA that is a small nondominant vessel.  No obvious lesions within stents noted. Ostial LCx roughly 60% --> highly DFR positive with 0.84 Long mid LAD 60% after stent--highly DFR +0.67-0.73 Normal LVEDP   With significant LAD as well as ostial LCx disease--best course of action is CVTS consultation for CABG.   Recent Labs: No results found for requested labs within last 365 days.  No results found for requested labs within last 365 days.   CrCl cannot be calculated (Patient's most recent lab result is older than the maximum 21 days allowed.).   Wt Readings from Last 3 Encounters:  01/04/22 205 lb 12.8 oz (93.4 kg)  12/06/21 207 lb (93.9 kg)  11/01/21 208 lb (94.3 kg)     Other studies reviewed: Additional studies/records reviewed today include: summarized above  ASSESSMENT AND PLAN:  CRT-P Intact function programmed as discussed above  CAD No new or anginal sounding symptoms On Plavix,  statin, BB  HTN Looks good A recheck today my myself is 110/64   Disposition: F/u with remotes as usual an din clinic in 11mo sooner if needed  Current medicines are reviewed at length with the patient today.  The patient did not have any concerns regarding medicines.  SVenetia Night PA-C 01/04/2022 1:23 PM     [place] [phone])  [phone](fax)

## 2022-01-04 ENCOUNTER — Encounter: Payer: Self-pay | Admitting: Physician Assistant

## 2022-01-04 ENCOUNTER — Ambulatory Visit: Payer: Medicare Other | Admitting: Cardiology

## 2022-01-04 ENCOUNTER — Ambulatory Visit: Payer: Medicare Other | Attending: Physician Assistant | Admitting: Physician Assistant

## 2022-01-04 VITALS — BP 100/60 | HR 74 | Ht 68.0 in | Wt 205.8 lb

## 2022-01-04 DIAGNOSIS — I2510 Atherosclerotic heart disease of native coronary artery without angina pectoris: Secondary | ICD-10-CM

## 2022-01-04 DIAGNOSIS — Z950 Presence of cardiac pacemaker: Secondary | ICD-10-CM | POA: Diagnosis not present

## 2022-01-04 DIAGNOSIS — I10 Essential (primary) hypertension: Secondary | ICD-10-CM | POA: Diagnosis not present

## 2022-01-04 LAB — CUP PACEART INCLINIC DEVICE CHECK
Battery Remaining Longevity: 28 mo
Battery Voltage: 2.91 V
Brady Statistic AP VP Percent: 1.67 %
Brady Statistic AP VS Percent: 0.01 %
Brady Statistic AS VP Percent: 98.32 %
Brady Statistic AS VS Percent: 0.01 %
Brady Statistic RA Percent Paced: 1.64 %
Brady Statistic RV Percent Paced: 99.98 %
Date Time Interrogation Session: 20230908133311
Implantable Lead Implant Date: 20180315
Implantable Lead Implant Date: 20180315
Implantable Lead Implant Date: 20180315
Implantable Lead Location: 753858
Implantable Lead Location: 753859
Implantable Lead Location: 753860
Implantable Lead Model: 4076
Implantable Lead Model: 4298
Implantable Lead Model: 5076
Implantable Pulse Generator Implant Date: 20180315
Lead Channel Impedance Value: 285 Ohm
Lead Channel Impedance Value: 304 Ohm
Lead Channel Impedance Value: 399 Ohm
Lead Channel Impedance Value: 399 Ohm
Lead Channel Impedance Value: 418 Ohm
Lead Channel Impedance Value: 418 Ohm
Lead Channel Impedance Value: 437 Ohm
Lead Channel Impedance Value: 494 Ohm
Lead Channel Impedance Value: 532 Ohm
Lead Channel Impedance Value: 703 Ohm
Lead Channel Impedance Value: 722 Ohm
Lead Channel Impedance Value: 760 Ohm
Lead Channel Impedance Value: 817 Ohm
Lead Channel Impedance Value: 855 Ohm
Lead Channel Pacing Threshold Amplitude: 1.25 V
Lead Channel Pacing Threshold Amplitude: 1.375 V
Lead Channel Pacing Threshold Amplitude: 2.5 V
Lead Channel Pacing Threshold Pulse Width: 0.4 ms
Lead Channel Pacing Threshold Pulse Width: 0.4 ms
Lead Channel Pacing Threshold Pulse Width: 0.9 ms
Lead Channel Sensing Intrinsic Amplitude: 1.125 mV
Lead Channel Sensing Intrinsic Amplitude: 1.25 mV
Lead Channel Sensing Intrinsic Amplitude: 19.75 mV
Lead Channel Sensing Intrinsic Amplitude: 19.75 mV
Lead Channel Setting Pacing Amplitude: 2.5 V
Lead Channel Setting Pacing Amplitude: 2.5 V
Lead Channel Setting Pacing Amplitude: 2.75 V
Lead Channel Setting Pacing Pulse Width: 0.4 ms
Lead Channel Setting Pacing Pulse Width: 0.9 ms
Lead Channel Setting Sensing Sensitivity: 4 mV

## 2022-01-04 NOTE — Patient Instructions (Signed)
Medication Instructions:    Your physician recommends that you continue on your current medications as directed. Please refer to the Current Medication list given to you today.  *If you need a refill on your cardiac medications before your next appointment, please call your pharmacy*   Lab Work: [place]   If you have labs (blood work) drawn today and your tests are completely normal, you will receive your results only by: [place] (if you have MyChart) OR A paper copy in the mail If you have any lab test that is abnormal or we need to change your treatment, we will call you to review the results.   Testing/Procedures: NONE ORDERED  TODAY    Follow-Up: At [facility], you and your health needs are our priority.  As part of our continuing mission to provide you with exceptional heart care, we have created designated Provider Care Teams.  These Care Teams include your primary Cardiologist (physician) and Advanced Practice Providers (APPs -  Physician Assistants and Nurse Practitioners) who all work together to provide you with the care you need, when you need it.  We recommend signing up for the patient portal called "MyChart".  Sign up information is provided on this After Visit Summary.  MyChart is used to connect with patients for Virtual Visits (Telemedicine).  Patients are able to view lab/test results, encounter notes, upcoming appointments, etc.  Non-urgent messages can be sent to your provider as well.   To learn more about what you can do with MyChart, go to [email or web address].    Your next appointment:   6 month(s)  The format for your next appointment:   In Person  Provider:   You will see one of the following Advanced Practice Providers on your designated Care Team:   Tommye Standard, [place] Legrand Como "Jonni Sanger" Chalmers Cater, [place]    Other Instructions   Important Information About Sugar

## 2022-01-09 NOTE — Progress Notes (Signed)
Remote pacemaker transmission.   

## 2022-01-30 ENCOUNTER — Telehealth: Payer: Self-pay | Admitting: *Deleted

## 2022-01-30 NOTE — Telephone Encounter (Signed)
Primary Cardiologist:David Ellyn Hack, MD  Due to recent symptoms of chest discomfort and multiple office visits, I would recommend that we conduct a virtual visit with this patient.   Preoperative team, please contact this patient and set up a phone call appointment for further preoperative risk assessment. Please obtain consent and complete medication review. Thank you for your help.   Per office protocol, he may hold Plavix for 5 days prior to procedure given that he is not experiencing symptoms of ACS which will be assessed by APP during virtual visit.    Emmaline Life, NP-C  01/30/2022, 8:54 AM [address], [address] Office [phone] Fax [phone]

## 2022-01-30 NOTE — Telephone Encounter (Signed)
   Pre-operative Risk Assessment    Patient Name: John Dodson  DOB: December 07, 1960 MRN: [id number]      Request for Surgical Clearance    Procedure:   COLONOSCOPY  Date of Surgery:  Clearance TBD                                 Surgeon:  DR. Marin Comment Surgeon's Group or Practice Name:  [facility] Phone number:  [phone] Fax number:  [phone]   Type of Clearance Requested:   - Pharmacy:  Hold Clopidogrel (Plavix) X'S 5-7 DAYS   Type of Anesthesia:  Not Indicated   Additional requests/questions:    [name]   01/30/2022, 6:57 AM

## 2022-01-31 ENCOUNTER — Telehealth: Payer: Self-pay | Admitting: *Deleted

## 2022-01-31 NOTE — Telephone Encounter (Signed)
Pt agreeable to plan of care for tele pre op app 02/07/22 @ 9 am. Med rec and consent are done.      Patient Consent for Virtual Visit        John Dodson has provided verbal consent on 01/31/2022 for a virtual visit (video or telephone).   CONSENT FOR VIRTUAL VISIT FOR:  John Dodson  By participating in this virtual visit I agree to the following:  I hereby voluntarily request, consent and authorize [facility] and its employed or contracted physicians, physician assistants, nurse practitioners or other licensed health care professionals (the Practitioner), to provide me with telemedicine health care services (the "Services") as deemed necessary by the treating Practitioner. I acknowledge and consent to receive the Services by the Practitioner via telemedicine. I understand that the telemedicine visit will involve communicating with the Practitioner through live audiovisual communication technology and the disclosure of certain medical information by electronic transmission. I acknowledge that I have been given the opportunity to request an in-person assessment or other available alternative prior to the telemedicine visit and am voluntarily participating in the telemedicine visit.  I understand that I have the right to withhold or withdraw my consent to the use of telemedicine in the course of my care at any time, without affecting my right to future care or treatment, and that the Practitioner or I may terminate the telemedicine visit at any time. I understand that I have the right to inspect all information obtained and/or recorded in the course of the telemedicine visit and may receive copies of available information for a reasonable fee.  I understand that some of the potential risks of receiving the Services via telemedicine include:  Delay or interruption in medical evaluation due to technological equipment failure or disruption; Information transmitted may not be  sufficient (e.g. poor resolution of images) to allow for appropriate medical decision making by the Practitioner; and/or  In rare instances, security protocols could fail, causing a breach of personal health information.  Furthermore, I acknowledge that it is my responsibility to provide information about my medical history, conditions and care that is complete and accurate to the best of my ability. I acknowledge that Practitioner's advice, recommendations, and/or decision may be based on factors not within their control, such as incomplete or inaccurate data provided by me or distortions of diagnostic images or specimens that may result from electronic transmissions. I understand that the practice of medicine is not an exact science and that Practitioner makes no warranties or guarantees regarding treatment outcomes. I acknowledge that a copy of this consent can be made available to me via my patient portal ([facility]), or I can request a printed copy by calling the office of [facility].    I understand that my insurance will be billed for this visit.   I have read or had this consent read to me. I understand the contents of this consent, which adequately explains the benefits and risks of the Services being provided via telemedicine.  I have been provided ample opportunity to ask questions regarding this consent and the Services and have had my questions answered to my satisfaction. I give my informed consent for the services to be provided through the use of telemedicine in my medical care

## 2022-01-31 NOTE — Telephone Encounter (Signed)
Pt agreeable to plan of care for tele pre op app 02/07/22 @ 9 am. Med rec and consent are done.

## 2022-02-07 ENCOUNTER — Ambulatory Visit: Payer: Medicare Other | Attending: Cardiology | Admitting: Physician Assistant

## 2022-02-07 DIAGNOSIS — Z01810 Encounter for preprocedural cardiovascular examination: Secondary | ICD-10-CM | POA: Diagnosis not present

## 2022-02-07 NOTE — Progress Notes (Signed)
Virtual Visit via Telephone Note   Because of John Dodson's co-morbid illnesses, he is at least at moderate risk for complications without adequate follow up.  This format is felt to be most appropriate for this patient at this time.  The patient did not have access to video technology/had technical difficulties with video requiring transitioning to audio format only (telephone).  All issues noted in this document were discussed and addressed.  No physical exam could be performed with this format.  Please refer to the patient's chart for his consent to telehealth for John Dodson.  Evaluation Performed:  Preoperative cardiovascular risk assessment _____________   Date:  02/07/2022   Patient ID:  John Dodson, John Dodson, John Dodson Patient Location:  Home Provider location:   Office  Primary Care Provider:  Kristopher Glee., MD Primary Cardiologist:  Glenetta Hew, MD  Chief Complaint / Patient Profile   61 y.o. 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  Past Medical History    Past Medical History:  Diagnosis Date   Blepharitis of both eyes    Chronic   Cataracts, both eyes    CKD (chronic kidney disease) stage 3, GFR 30-59 ml/min (HCC)    s/p R Nephrectomy/adrenalectomy & DM-Nephropathy (baseline creatinine 1.4-1.5)   COVID-19 virus infection 04/25/2019   Diabetic peripheral neuropathy associated with type 2 diabetes mellitus (HCC)    Essential hypertension    GERD without esophagitis    History of basal cell carcinoma (BCC) excision    History of complete heart block 06/2016   s/p BiV PPM (now followed by Dr. Merlyn Lot - EP)   History of  non-ST elevation myocardial infarction (NSTEMI)    And several occasions of unstable angina   Hx of CABGx4 08/25/2019   (Dr. Briant Cedar, [facility]): LIMA-LAD, Seq Rad- OM2-LPDA, RIMA-? OM1 (called Ramus on report, but no ramus seen on cath).   Hyperlipidemia associated with type 2 diabetes mellitus ([place])    on atorvastatin 40 mg.   Multiple Vessel CAD -- s/p PCI of LAD, OM1, LPL1, LPDA & non-dominant RCA --> now S/P CABG x 4 60%   (No cath report prior to 2016) (stents in proxLAD, prox OM1 x 2, prox LPL1 & 3-4 in L PDA & ~2 in  Known non-dominant RCA;  07/2019: MV CAD - patent stents in p-m LAD, p OM1, p LPL & LPDA as well as RCA -> Ost LCx ~60-70% (DFR ++), long mLAD 60% post-stent (DFR ++) --> referred for CABG   Obesity (BMI 30.0-34.9)    Occlusion of left vertebral artery    Consider repossible acute lesion in August 2020 with presentation of headache.  CTA suggested occluded left vertebral artery throughout the neck.  Faint string-like enhancement intermittently visible suggesting recent occlusion.  Partial reconstruction and posterior fossa.  Left PICA patent.  (Consider possible left vertebral artery dissection)   OSA treated with BiPAP 02/2019   Diagnosis in late 2020 ([facility])--> delayed onset of treatment with BiPAP due to Covid hospitalization followed by PE. ->  BiPAP setting at 21/17 cm   Pulmonary embolism ([place]) 05/18/2019   (1 month following COVID-19 infection): DVT-PE (bilateral PEs noted on VQ scan)-> started on warfarin.  (On warfarin  plus Plavix now with aspirin stopped.)   Type 2 diabetes mellitus with complication, with long-term current use of insulin ([place])    On Lantus, Jardiance, Metformin & Onglyza   Past Surgical History:  Procedure Laterality Date   BASAL CELL CARCINOMA EXCISION  01/2015   CAROTID DUPLEX SCAN  12/25/2018   [facility]: Mild plaque in both carotids.  139% bilateral.  Right vertebral flow normal antegrade, Left not seen; normal bilateral  subclavian flow..   CHOLECYSTECTOMY     CORONARY ARTERY BYPASS GRAFT N/A 08/25/2019   Procedure: CORONARY ARTERY BYPASS GRAFTING (CABG) TIMES FOUR, ON PUMP, USING LEFT AND RIGHT INTERNAL MAMMARY ARTERY AND LEFT RADIAL ARTERY;  Surgeon: [name], MD;  Location: [facility];  Service: Open Heart Surgery;; LIMA-LAD, Seq Rad OM2-LPDA, RIMA-OM1   CORONARY STENT INTERVENTION  04/10/2015   ([facility]; Bishop Limbo, DO) -> DES PCI mLPDA: Xience Alpine DES 2.5 mm x 18 mm, Xience Alpine DES 2.25 mm x 12 mm overlapping.   CORONARY STENT INTERVENTION  04/04/2016   ([place]; [place], MD)--> (urgent) 100% mLPDA PTCA with 2.25 mm balloon - reduced to 50%; mid OM2 90% - DES PCI (Xience DES  2.5 x 18).    CORONARY STENT INTERVENTION  2015   Prior to December 2016, STENTS noted in prox LAD, proximal OM1, and at least 2 stents in proximal L PDA   CORONARY STENT INTERVENTION  11/17/2018   ([place]; Bishop Limbo, DO): CULPRIT: mid RCA 90% (DES PCI) -Resolute Onyx DES 2.5 mm x 30 mm --> COMPLICATION: Large left arm hematoma-evaluated by vascular and orthopedic surgery, managed with splint and arm elevation.   CORONARY STENT INTERVENTION  12/28/2018   ([place]; Clarene Critchley, MD, referred by Dr. Mathis Bud) --> INDICATION (urgent, Unstable Angina): Moderate-severe (70%) stenosis of ostial RCA  -> DES PCI Resolute Onyx DES 2.5 mm x 12 mm.    LEFT HEART CATH AND CORONARY ANGIOGRAPHY  04/10/2015   ([place]; Bishop Limbo, DO)--> EF 55%.  Mild inferior HK.  Coronaries-LM: Normal, p LAD STENT 30% ISR, mLAD 20% & diffuse dLAD ~20%; Dom LCx: mCx 20%, ~pOM1 STENT (noted as OM2 in other reports) patent with mid 30%, OM2 normal, prox LPDA "STENTS" patent with SEVERE mid L PDA 90-95% (DES PCI x 2 overlapping); Small-non-dominant RCA  patent    LEFT HEART CATH AND CORONARY ANGIOGRAPHY  04/04/2016   ([place]; [place], MD)-->  (urgent): EF 70%.  Previous LAD,~OM2 and proximal PDA stents patent; -> mLAD 35%, dLAD 40%; LCx-OM1 75% (short lesion, med Rx), mid OM2 90% (DES PCI), pLPDA stents patent w/ mPDA 100% (PTCA only - reduced to 50%); non-dom RCA - ost RCA 60% & mRCA 75%   LEFT HEART CATH AND CORONARY ANGIOGRAPHY  11/17/2018   ([place]; Bishop Limbo, DO) indication: Angina.  LM normal; LAD - ost LAD ~50%, pLAD STENT ~30% ISR with dLAD ~40%; Dom LCx - ost & prox Cx 30%, OM1 STENT 20% ISR, OM2 STENT 50% distal edge, pLPDA overlapping STENTS ~20%; nonDom RCA - proxRCA 40%, mid 90% (DES PCI)   LEFT HEART CATH AND CORONARY ANGIOGRAPHY  12/28/2018   ([place]; Clarene Critchley, MD, referred by Dr. Jeneen Rinks [place]) --> INDICATION (urgent, Unstable Angina): Moderate-severe (70%) stenosis of ostial RCA (DES PCI), mRCA 30%.  Otherwise no significant change from July 2020: dLM 20%, pLAD ~40% ISR , dLAD long/diffuse ~50%; OstLCx 30%, OM1 ~15% OM2 ~30%  with patent  LPDA stents/PTCA site.    LEFT HEART CATH AND CORONARY ANGIOGRAPHY N/A 08/20/2019   Procedure: LEFT HEART CATH AND CORONARY ANGIOGRAPHY;  Surgeon: Leonie Man, MD;  Location: [facility];  Service: Cardiovascular;; Multivessel CAD: Patent stents in proximal to mid LAD, proximal OM1, proximal LPL 1 and several stents in L PDA as well as RCA (nondominant).  Ostial LCx 60%-highly DFR +0.84.  There is a long mid LAD 60% stenosis after the stent--highly DFR +0.67.--> CABG   NEPHRECTOMY Right 2012   With adrenalectomy   NM MYOVIEW LTD  08/20/2018   [facility] -> Lexiscan Myoview: EF 81%.  No reversible ischemia or infarction.  Normal wall motion.   PACEMAKER IMPLANT  06/2016   [place] [facility] [place] ([employer]) -> according to CT of chest, leads positioned in right atrium, cardiac apex and coronary sinus (suggesting CRT-P - BiV Pacing))   RADIAL ARTERY HARVEST Left 08/25/2019   Procedure: RADIAL ARTERY HARVEST;  Surgeon:  [name], MD;  Location: [facility];  Service: Open Heart Surgery;  Laterality: Left;   TEE WITHOUT CARDIOVERSION N/A 08/25/2019   Procedure: TRANSESOPHAGEAL ECHOCARDIOGRAM (TEE);  Surgeon: [name], MD;  Location: [facility];  Service: Open Heart Surgery;  Laterality: N/A;   TRANSTHORACIC ECHOCARDIOGRAM  11/2018; 05/01/2019   ([place] [facility]) a) EF 60-65%.  Mild TR.;; b)moderate concentric LVH.  EF 55 to 60%.  No R WMA.  Normal RV size and function.  Normal atrial sizes.  Normal valves.    Allergies  Allergies  Allergen Reactions   Latex Rash   Codeine Nausea And Vomiting   Contrast Media [Iodinated Contrast Media]     Reportedly cardiac arrest   Integrilin [Eptifibatide]     Reportedly had shortness of breath, confusion.   Tylenol [Acetaminophen]     Tylenol -3 with codiene   Zithromax [Azithromycin] Nausea And Vomiting   Glipizide Rash    Headache    History of Present Illness    ROGELIO WAYNICK is a 61 y.o. male who presents via audio/video conferencing for a telehealth visit today.  Pt was last seen in cardiology clinic on 01/04/2022 by Tommye Standard, PA-C.  At that time LUVERN MCISAAC was doing well .  The patient is now pending procedure as outlined above. Since his last visit, he tells me he has been doing pretty good from a cardiovascular standpoint.  He did have an episode yesterday morning of some pain on his right side of his chest.  He was laying on that side.  He stated it was a shooting pain.  He was feeling bad at the time.  He did send in a pacemaker report for review.  No issues since.  He tells me he has no issues with walking or doing household tasks.  He does not do any outdoor work.  He does enjoy going boating.  For this reason he scored a 5.87 on the DASI.  This exceeds the minimum 4 METS requirement.   We did discuss holding his Plavix x5 days prior to the procedure.  Please restart medically safe to do so.  Home Medications    Prior to  Admission medications   Medication Sig Start Date End Date Taking? Authorizing Provider  albuterol (VENTOLIN HFA) 108 (90 Base) MCG/ACT inhaler Inhale 2 puffs into the lungs every 4 (four) hours as needed for wheezing or shortness of breath.    [provider]  allopurinol (ZYLOPRIM)  300 MG tablet Take 300 mg by mouth daily. 03/17/20   [provider]  amLODipine (NORVASC) 5 MG tablet Take 1 tablet (5 mg total) by mouth daily. 12/17/19   Evans Lance, MD  atorvastatin (LIPITOR) 40 MG tablet Take 1 tablet (40 mg total) by mouth daily. 12/17/19   Evans Lance, MD  carvedilol (COREG) 12.5 MG tablet Take 1 tablet (12.5 mg total) by mouth 2 (two) times daily with a meal. 12/17/19   Evans Lance, MD  clopidogrel (PLAVIX) 75 MG tablet Take 1 tablet (75 mg total) by mouth daily. Please keep scheduled appointment in Nov for further refills. 02/27/21   Evans Lance, MD  colchicine 0.6 MG tablet Take 0.6 mg by mouth as needed (gout flare ups). 11/30/19   [provider]  fluticasone (FLONASE) 50 MCG/ACT nasal spray Place 2 sprays into both nostrils as needed for congestion. 11/03/20   [provider]  furosemide (LASIX) 40 MG tablet Take 1 tablet (40 mg total) by mouth daily as needed for fluid or edema (Poor more than 3 pound weight gain overnight or more than 5 pound weight gain over baseline). 12/17/19   Evans Lance, MD  insulin glargine (LANTUS SOLOSTAR) 100 UNIT/ML Solostar Pen Inject 50 Units into the skin every evening. 100UNITS/3 ML    [provider]  JARDIANCE 25 MG TABS tablet Take 25 mg by mouth daily. 05/11/20   [provider]  losartan (COZAAR) 25 MG tablet Take 12.5 mg by mouth daily. 01/15/21   [provider]  MAGNESIUM OXIDE PO Take 500 mg by mouth daily. TAKE 1  TABLET BY MOUTH NIGHTLY FOR LEG CRAMPS    [provider]  nitroGLYCERIN (NITROSTAT) 0.4 MG SL tablet Place 1 tablet (0.4 mg total) under the tongue every 5  (five) minutes as needed for chest pain. 05/16/21   Leonie Man, MD  Omega-3 Fatty Acids (FISH OIL BURP-LESS) 1000 MG CAPS Take 1,000 mg by mouth 2 (two) times daily.    [provider]  OZEMPIC, 0.25 OR 0.5 MG/DOSE, 2 MG/1.5ML SOPN Inject 0.5 mg into the skin once a week. 01/31/21   [provider]  pantoprazole (PROTONIX) 40 MG tablet Take 40 mg by mouth daily. 12/09/13   [provider]  traMADol (ULTRAM) 50 MG tablet Take 50 mg by mouth every 6 (six) hours as needed for pain. 11/30/19   [provider]    Physical Exam    Vital Signs:  MEET [place] does not have vital signs available for review today.  Given telephonic nature of communication, physical exam is limited. AAOx3. NAD. Normal affect.  Speech and respirations are unlabored.  Accessory Clinical Findings    None  Assessment & Plan    1.  Preoperative Cardiovascular Risk Assessment:  Mr. Tirado perioperative risk of a major cardiac event is 6.6% according to the Revised Cardiac Risk Index (RCRI).  Therefore, he is at high risk for perioperative complications.   His functional capacity is good at 5.81 METs according to the [place] Activity Status Index (DASI). Recommendations: According to ACC/AHA guidelines, no further cardiovascular testing needed.  The patient may proceed to surgery at acceptable risk.   Antiplatelet and/or Anticoagulation Recommendations: Clopidogrel (Plavix) can be held for 5 days prior to his surgery and resumed as soon as possible post op.  A copy of this note will be routed to requesting surgeon.  Time:   Today, I have spent 12 minutes with  the patient with telehealth technology discussing medical history, symptoms, and management plan.     Elgie Collard, PA-C  02/07/2022, 8:54 AM

## 2022-03-15 ENCOUNTER — Ambulatory Visit (INDEPENDENT_AMBULATORY_CARE_PROVIDER_SITE_OTHER): Payer: Medicare Other

## 2022-03-15 DIAGNOSIS — I442 Atrioventricular block, complete: Secondary | ICD-10-CM

## 2022-03-16 ENCOUNTER — Other Ambulatory Visit: Payer: Self-pay | Admitting: Internal Medicine

## 2022-03-17 ENCOUNTER — Encounter: Payer: Self-pay | Admitting: Internal Medicine

## 2022-03-17 ENCOUNTER — Other Ambulatory Visit: Payer: Self-pay | Admitting: Internal Medicine

## 2022-03-18 LAB — CUP PACEART REMOTE DEVICE CHECK
Battery Remaining Longevity: 27 mo
Battery Voltage: 2.9 V
Brady Statistic AP VP Percent: 0.06 %
Brady Statistic AP VS Percent: 0 %
Brady Statistic AS VP Percent: 99.93 %
Brady Statistic AS VS Percent: 0.01 %
Brady Statistic RA Percent Paced: 0.07 %
Brady Statistic RV Percent Paced: 99.99 %
Date Time Interrogation Session: 20231118133359
Implantable Lead Connection Status: 753985
Implantable Lead Connection Status: 753985
Implantable Lead Connection Status: 753985
Implantable Lead Implant Date: 20180315
Implantable Lead Implant Date: 20180315
Implantable Lead Implant Date: 20180315
Implantable Lead Location: 753858
Implantable Lead Location: 753859
Implantable Lead Location: 753860
Implantable Lead Model: 4076
Implantable Lead Model: 4298
Implantable Lead Model: 5076
Implantable Pulse Generator Implant Date: 20180315
Lead Channel Impedance Value: 266 Ohm
Lead Channel Impedance Value: 285 Ohm
Lead Channel Impedance Value: 342 Ohm
Lead Channel Impedance Value: 380 Ohm
Lead Channel Impedance Value: 399 Ohm
Lead Channel Impedance Value: 399 Ohm
Lead Channel Impedance Value: 418 Ohm
Lead Channel Impedance Value: 456 Ohm
Lead Channel Impedance Value: 475 Ohm
Lead Channel Impedance Value: 608 Ohm
Lead Channel Impedance Value: 627 Ohm
Lead Channel Impedance Value: 703 Ohm
Lead Channel Impedance Value: 722 Ohm
Lead Channel Impedance Value: 722 Ohm
Lead Channel Pacing Threshold Amplitude: 1.125 V
Lead Channel Pacing Threshold Amplitude: 1.5 V
Lead Channel Pacing Threshold Amplitude: 2 V
Lead Channel Pacing Threshold Pulse Width: 0.4 ms
Lead Channel Pacing Threshold Pulse Width: 0.4 ms
Lead Channel Pacing Threshold Pulse Width: 0.9 ms
Lead Channel Sensing Intrinsic Amplitude: 1.125 mV
Lead Channel Sensing Intrinsic Amplitude: 1.125 mV
Lead Channel Sensing Intrinsic Amplitude: 19.75 mV
Lead Channel Sensing Intrinsic Amplitude: 19.75 mV
Lead Channel Setting Pacing Amplitude: 2.5 V
Lead Channel Setting Pacing Amplitude: 2.5 V
Lead Channel Setting Pacing Amplitude: 3 V
Lead Channel Setting Pacing Pulse Width: 0.4 ms
Lead Channel Setting Pacing Pulse Width: 0.9 ms
Lead Channel Setting Sensing Sensitivity: 4 mV
Zone Setting Status: 755011
Zone Setting Status: 755011

## 2022-03-18 MED ORDER — CLOPIDOGREL BISULFATE 75 MG PO TABS: 75.0000 mg | ORAL_TABLET | Freq: Every day | ORAL | 3 refills | Status: DC

## 2022-04-02 NOTE — Progress Notes (Signed)
Remote pacemaker transmission.   

## 2022-05-24 ENCOUNTER — Ambulatory Visit: Payer: Medicare Other | Attending: Nurse Practitioner | Admitting: Nurse Practitioner

## 2022-05-24 ENCOUNTER — Encounter: Payer: Self-pay | Admitting: Nurse Practitioner

## 2022-05-24 VITALS — BP 130/76 | HR 78 | Ht 68.0 in | Wt 197.8 lb

## 2022-05-24 DIAGNOSIS — I2510 Atherosclerotic heart disease of native coronary artery without angina pectoris: Secondary | ICD-10-CM | POA: Diagnosis not present

## 2022-05-24 DIAGNOSIS — Z950 Presence of cardiac pacemaker: Secondary | ICD-10-CM

## 2022-05-24 DIAGNOSIS — E785 Hyperlipidemia, unspecified: Secondary | ICD-10-CM

## 2022-05-24 DIAGNOSIS — Z794 Long term (current) use of insulin: Secondary | ICD-10-CM

## 2022-05-24 DIAGNOSIS — I442 Atrioventricular block, complete: Secondary | ICD-10-CM

## 2022-05-24 DIAGNOSIS — E118 Type 2 diabetes mellitus with unspecified complications: Secondary | ICD-10-CM

## 2022-05-24 DIAGNOSIS — R002 Palpitations: Secondary | ICD-10-CM

## 2022-05-24 DIAGNOSIS — I10 Essential (primary) hypertension: Secondary | ICD-10-CM

## 2022-05-24 DIAGNOSIS — N183 Chronic kidney disease, stage 3 unspecified: Secondary | ICD-10-CM

## 2022-05-24 MED ORDER — RANOLAZINE ER 500 MG PO TB12: 500.0000 mg | ORAL_TABLET | Freq: Two times a day (BID) | ORAL | 3 refills | Status: DC

## 2022-05-24 NOTE — Progress Notes (Signed)
Office Visit    Patient Name: John Dodson Date of Encounter: 05/24/2022  Primary Care Provider:  Kristopher Glee., MD Primary Cardiologist:  Glenetta Hew, MD  Chief Complaint    62 year old male with a history of CAD s/p multiple prior stents, s/p CABG x 4 in 07/2019, CHB s/p biventricular PPM in 06/2016, palpitations, hypertension, hyperlipidemia, PE following COVID-19 infection , CKD stage III and type 2 diabetes who presents for follow-up related to CAD.  Past Medical History    Past Medical History:  Diagnosis Date   Blepharitis of both eyes    Chronic   Cataracts, both eyes    CKD (chronic kidney disease) stage 3, GFR 30-59 ml/min (HCC)    s/p R Nephrectomy/adrenalectomy & DM-Nephropathy (baseline creatinine 1.4-1.5)   COVID-19 virus infection 04/25/2019   Diabetic peripheral neuropathy associated with type 2 diabetes mellitus (HCC)    Essential hypertension    GERD without esophagitis    History of basal cell carcinoma (BCC) excision    History of complete heart block 06/2016   s/p BiV PPM (now followed by Dr. Merlyn Lot - EP)   History of non-ST elevation myocardial infarction (NSTEMI)    And several occasions of unstable angina   Hx of CABGx4 08/25/2019   (Dr. Briant Cedar, [facility]): LIMA-LAD, Seq Rad- OM2-LPDA, RIMA-? OM1 (called Ramus on report, but no ramus seen on cath).   Hyperlipidemia associated with type 2 diabetes mellitus ([place])    on atorvastatin 40 mg.   Multiple Vessel CAD -- s/p PCI of LAD, OM1, LPL1, LPDA & non-dominant RCA --> now S/P CABG x 4 60%   (No cath report prior to 2016) (stents in proxLAD, prox OM1 x 2, prox LPL1 & 3-4 in L PDA & ~2 in  Known non-dominant RCA;  07/2019: MV CAD - patent stents in p-m LAD, p OM1, p LPL & LPDA as well as RCA -> Ost LCx ~60-70% (DFR ++), long mLAD 60% post-stent (DFR ++) --> referred for CABG   Obesity (BMI 30.0-34.9)    Occlusion of left vertebral artery    Consider repossible acute lesion in August 2020 with  presentation of headache.  CTA suggested occluded left vertebral artery throughout the neck.  Faint string-like enhancement intermittently visible suggesting recent occlusion.  Partial reconstruction and posterior fossa.  Left PICA patent.  (Consider possible left vertebral artery dissection)   OSA treated with BiPAP 02/2019   Diagnosis in late 2020 ([facility])--> delayed onset of treatment with BiPAP due to Covid hospitalization followed by PE. ->  BiPAP setting at 21/17 cm   Pulmonary embolism ([place]) 05/18/2019   (1 month following COVID-19 infection): DVT-PE (bilateral PEs noted on VQ scan)-> started on warfarin.  (On warfarin plus Plavix now with aspirin stopped.)   Type 2 diabetes mellitus with complication, with long-term current use of insulin ([place])    On Lantus, Jardiance, Metformin & Onglyza   Past Surgical History:  Procedure Laterality Date   BASAL CELL CARCINOMA EXCISION  01/2015   CAROTID DUPLEX SCAN  12/25/2018   [facility]: Mild plaque in both carotids.  139% bilateral.  Right vertebral flow normal antegrade, Left not seen; normal bilateral subclavian flow..   CHOLECYSTECTOMY     CORONARY ARTERY BYPASS GRAFT N/A 08/25/2019   Procedure: CORONARY ARTERY BYPASS GRAFTING (CABG) TIMES FOUR, ON PUMP, USING LEFT AND RIGHT INTERNAL MAMMARY ARTERY AND LEFT RADIAL ARTERY;  Surgeon: [name], MD;  Location: [facility];  Service: Open Heart Surgery;;  LIMA-LAD, Seq Rad OM2-LPDA, RIMA-OM1   CORONARY STENT INTERVENTION  04/10/2015   ([place]; Bishop Limbo, DO) -> DES PCI mLPDA: Xience Alpine DES 2.5 mm x 18 mm, Xience Alpine DES 2.25 mm x 12 mm overlapping.   CORONARY STENT INTERVENTION  04/04/2016   ([place]; [place], MD)--> (urgent) 100% mLPDA PTCA with 2.25 mm balloon - reduced to 50%; mid OM2 90% - DES PCI (Xience DES  2.5 x 18).    CORONARY STENT INTERVENTION  2015   Prior to December 2016, STENTS noted in prox LAD, proximal OM1, and at  least 2 stents in proximal L PDA   CORONARY STENT INTERVENTION  11/17/2018   ([place]; Bishop Limbo, DO): CULPRIT: mid RCA 90% (DES PCI) -Resolute Onyx DES 2.5 mm x 30 mm --> COMPLICATION: Large left arm hematoma-evaluated by vascular and orthopedic surgery, managed with splint and arm elevation.   CORONARY STENT INTERVENTION  12/28/2018   ([place]; Clarene Critchley, MD, referred by Dr. Mathis Bud) --> INDICATION (urgent, Unstable Angina): Moderate-severe (70%) stenosis of ostial RCA  -> DES PCI Resolute Onyx DES 2.5 mm x 12 mm.    LEFT HEART CATH AND CORONARY ANGIOGRAPHY  04/10/2015   ([place]; Bishop Limbo, DO)--> EF 55%.  Mild inferior HK.  Coronaries-LM: Normal, p LAD STENT 30% ISR, mLAD 20% & diffuse dLAD ~20%; Dom LCx: mCx 20%, ~pOM1 STENT (noted as OM2 in other reports) patent with mid 30%, OM2 normal, prox LPDA "STENTS" patent with SEVERE mid L PDA 90-95% (DES PCI x 2 overlapping); Small-non-dominant RCA  patent    LEFT HEART CATH AND CORONARY ANGIOGRAPHY  04/04/2016   ([place]; [place], MD)--> (urgent): EF 70%.  Previous LAD,~OM2 and proximal PDA stents patent; -> mLAD 35%, dLAD 40%; LCx-OM1 75% (short lesion, med Rx), mid OM2 90% (DES PCI), pLPDA stents patent w/ mPDA 100% (PTCA only - reduced to 50%); non-dom RCA - ost RCA 60% & mRCA 75%   LEFT HEART CATH AND CORONARY ANGIOGRAPHY  11/17/2018   ([place]; Bishop Limbo, DO) indication: Angina.  LM normal; LAD - ost LAD ~50%, pLAD STENT ~30% ISR with dLAD ~40%; Dom LCx - ost & prox Cx 30%, OM1 STENT 20% ISR, OM2 STENT 50% distal edge, pLPDA overlapping STENTS ~20%; nonDom RCA - proxRCA 40%, mid 90% (DES PCI)   LEFT HEART CATH AND CORONARY ANGIOGRAPHY  12/28/2018   ([place]; Clarene Critchley, MD, referred by Dr. Jeneen Rinks McGukin) --> INDICATION (urgent, Unstable Angina): Moderate-severe (70%) stenosis of ostial RCA (DES PCI), mRCA 30%.  Otherwise no  significant change from July 2020: dLM 20%, pLAD ~40% ISR , dLAD long/diffuse ~50%; OstLCx 30%, OM1 ~15% OM2 ~30% with patent  LPDA stents/PTCA site.    LEFT HEART CATH AND CORONARY ANGIOGRAPHY N/A 08/20/2019   Procedure: LEFT HEART CATH AND CORONARY ANGIOGRAPHY;  Surgeon: Leonie Man, MD;  Location: [facility];  Service: Cardiovascular;; Multivessel CAD: Patent stents in proximal to mid LAD, proximal OM1, proximal LPL 1 and several stents in L PDA as well as RCA (nondominant).  Ostial LCx 60%-highly DFR +0.84.  There is a long mid LAD 60% stenosis after the stent--highly DFR +0.67.--> CABG   NEPHRECTOMY Right 2012   With adrenalectomy   NM MYOVIEW LTD  08/20/2018   [facility] -> Lexiscan Myoview: EF 81%.  No reversible ischemia or infarction.  Normal wall motion.   PACEMAKER IMPLANT  06/2016  [facility] [place] ([employer]) -> according to CT of chest, leads positioned in right atrium, cardiac apex and coronary sinus (suggesting CRT-P - BiV Pacing))   RADIAL ARTERY HARVEST Left 08/25/2019   Procedure: RADIAL ARTERY HARVEST;  Surgeon: [name], MD;  Location: [facility];  Service: Open Heart Surgery;  Laterality: Left;   TEE WITHOUT CARDIOVERSION N/A 08/25/2019   Procedure: TRANSESOPHAGEAL ECHOCARDIOGRAM (TEE);  Surgeon: [name], MD;  Location: [facility];  Service: Open Heart Surgery;  Laterality: N/A;   TRANSTHORACIC ECHOCARDIOGRAM  11/2018; 05/01/2019   ([place]) a) EF 60-65%.  Mild TR.;; b)moderate concentric LVH.  EF 55 to 60%.  No R WMA.  Normal RV size and function.  Normal atrial sizes.  Normal valves.    Allergies  Allergies  Allergen Reactions   Latex Rash   Codeine Nausea And Vomiting   Contrast Media [Iodinated Contrast Media]     Reportedly cardiac arrest   Integrilin [Eptifibatide]     Reportedly had shortness of breath, confusion.   Tylenol [Acetaminophen]     Tylenol -3 with codiene   Zithromax [Azithromycin]  Nausea And Vomiting   Glipizide Rash    Headache     Labs/Other Studies Reviewed    The following studies were reviewed today: Lexiscan myoview 09/2021:   The study is normal. The study is low risk.   No ST deviation was noted.   LV perfusion is normal.   Left ventricular function is normal. Nuclear stress EF: 62 %. The left ventricular ejection fraction is normal (55-65%). End diastolic cavity size is normal.   Prior study available for comparison from 08/20/2018.   Low risk stress nuclear study with normal perfusion and normal left ventricular regional and global systolic function.    Recent Labs: No results found for requested labs within last 365 days.  Recent Lipid Panel    Component Value Date/Time   CHOL 122 08/24/2019 0331   TRIG 78 08/25/2019 2046   HDL 29 (L) 08/24/2019 0331   CHOLHDL 4.2 08/24/2019 0331   VLDL 29 08/24/2019 0331   LDLCALC 64 08/24/2019 0331    History of Present Illness    62 year old male with a history of CAD s/p multiple prior stents, CABG x 4 in 07/2019, CHB s/p biventricular PPM in 06/2016, palpitations, hypertension, hyperlipidemia, PE following COVID-19 infection , CKD stage III and type 2 diabetes.  Echocardiogram the time of his bypass surgery showed EF 55 to 60%, normal LV function. Lexiscan in 09/2021 in the setting of chest pain was low risk, EF 62%.  His chest discomfort was thought to be related to his incision/scar pain.  He was last seen in the office (EP) on 01/04/2022 and was stable from a cardiac standpoint.  He was seen virtually on 02/07/2022 for preoperative cardiac evaluation and was doing well.  He presents today for follow-up. Since his last visit he has been stable overall from a cardiac standpoint.  Continues to have intermittent chest discomfort.  He notes he had an episode 2 weeks ago which was relieved with nitroglycerin.  Symptoms have occurred predominantly at rest but can also occur with activity.  He denies any palpitations,  dizziness, presyncope, syncope, edema, PND, orthopnea, weight gain.  Other than his ongoing intermittent chest discomfort, he reports feeling well.  Home Medications    Current Outpatient Medications  Medication Sig Dispense Refill   albuterol (VENTOLIN HFA) 108 (90 Base) MCG/ACT inhaler Inhale 2 puffs into the lungs every  4 (four) hours as needed for wheezing or shortness of breath.     allopurinol (ZYLOPRIM) 300 MG tablet Take 300 mg by mouth daily.     amLODipine (NORVASC) 5 MG tablet Take 1 tablet (5 mg total) by mouth daily. 90 tablet 3   atorvastatin (LIPITOR) 40 MG tablet Take 1 tablet (40 mg total) by mouth daily. 90 tablet 3   carvedilol (COREG) 12.5 MG tablet Take 1 tablet (12.5 mg total) by mouth 2 (two) times daily with a meal. 180 tablet 3   clopidogrel (PLAVIX) 75 MG tablet Take 1 tablet (75 mg total) by mouth daily. 90 tablet 3   colchicine 0.6 MG tablet Take 0.6 mg by mouth as needed (gout flare ups).     fluticasone (FLONASE) 50 MCG/ACT nasal spray Place 2 sprays into both nostrils as needed for congestion.     furosemide (LASIX) 40 MG tablet Take 1 tablet (40 mg total) by mouth daily as needed for fluid or edema (Poor more than 3 pound weight gain overnight or more than 5 pound weight gain over baseline). 30 tablet 11   insulin glargine (LANTUS SOLOSTAR) 100 UNIT/ML Solostar Pen Inject 50 Units into the skin every evening. 100UNITS/3 ML     JARDIANCE 25 MG TABS tablet Take 25 mg by mouth daily.     losartan (COZAAR) 25 MG tablet Take 12.5 mg by mouth daily.     MAGNESIUM OXIDE PO Take 500 mg by mouth daily. TAKE 1  TABLET BY MOUTH NIGHTLY FOR LEG CRAMPS     nitroGLYCERIN (NITROSTAT) 0.4 MG SL tablet Place 1 tablet (0.4 mg total) under the tongue every 5 (five) minutes as needed for chest pain. 25 tablet 6   Omega-3 Fatty Acids (FISH OIL BURP-LESS) 1000 MG CAPS Take 1,000 mg by mouth 2 (two) times daily.     OZEMPIC, 0.25 OR 0.5 MG/DOSE, 2 MG/1.5ML SOPN Inject 0.5 mg into the  skin once a week.     pantoprazole (PROTONIX) 40 MG tablet Take 40 mg by mouth daily.     ranolazine (RANEXA) 500 MG 12 hr tablet Take 1 tablet (500 mg total) by mouth 2 (two) times daily. 180 tablet 3   traMADol (ULTRAM) 50 MG tablet Take 50 mg by mouth every 6 (six) hours as needed for pain. (Patient not taking: Reported on 05/24/2022)     No current facility-administered medications for this visit.     Review of Systems    He denies chest pain, palpitations, dyspnea, pnd, orthopnea, n, v, dizziness, syncope, edema, weight gain, or early satiety. All other systems reviewed and are otherwise negative except as noted above.   Physical Exam    VS:  BP 130/76   Pulse 78   Ht '5\' 8"'$  (1.727 m)   Wt 197 lb 12.8 oz (89.7 kg)   SpO2 98%   BMI 30.08 kg/m  GEN: Well nourished, well developed, in no acute distress. HEENT: normal. Neck: Supple, no JVD, carotid bruits, or masses. Cardiac: RRR, no murmurs, rubs, or gallops. No clubbing, cyanosis, edema.  Radials/DP/PT 2+ and equal bilaterally.  Respiratory:  Respirations regular and unlabored, clear to auscultation bilaterally. GI: Soft, nontender, nondistended, BS + x 4. MS: no deformity or atrophy. Skin: warm and dry, no rash. Neuro:  Strength and sensation are intact. Psych: Normal affect.  Accessory Clinical Findings    ECG personally reviewed by me today - No EKG in office today.    Lab Results  Component Value Date  WBC 8.4 11/24/2019   HGB 11.8 (L) 11/24/2019   HCT 38.7 (L) 11/24/2019   MCV 78.7 (L) 11/24/2019   PLT 244 11/24/2019   Lab Results  Component Value Date   CREATININE 1.55 (H) 11/25/2019   BUN 19 11/25/2019   NA 141 11/25/2019   K 4.3 11/25/2019   CL 103 11/25/2019   CO2 27 11/25/2019   Lab Results  Component Value Date   ALT 25 08/25/2019   AST 16 08/25/2019   ALKPHOS 74 08/25/2019   BILITOT 1.1 08/25/2019   Lab Results  Component Value Date   CHOL 122 08/24/2019   HDL 29 (L) 08/24/2019   LDLCALC 64  08/24/2019   TRIG 78 08/25/2019   CHOLHDL 4.2 08/24/2019    Lab Results  Component Value Date   HGBA1C 7.8 (H) 11/24/2019    Assessment & Plan    1. CAD: S/p multiple prior stents (per pt 12 in total), CABG x 4 in 2021.  He has had chronic intermittent chest discomfort, however, he thinks these episodes have become more frequent over the past 6 months.  Symptoms occur predominantly at rest but can also occur with activity.  He did have an episode 2 weeks ago that was relieved with nitroglycerin. Lexiscan myoview in 09/2021 was normal.  He did not tolerate Imdur in the past due to severe headache.  Previously on Ranexa.  Will resume Ranexa 500 mg twice daily.  If symptoms persist despite antianginal therapy, would discuss need for possible ischemic evaluation with Dr. Ellyn Hack.  Continue Plavix, amlodipine, carvedilol, losartan, Jardiance, Lasix, and Lipitor.  2. CHB: S/p PPM in 2018.  Follows with EP.  3. Palpitations: Denies any recent palpitations.  Stable on carvedilol.  4. Hypertension: BP well controlled. Continue current antihypertensive regimen.   5. Hyperlipidemia: LDL was 58 in 06/2021. Continue Lipitor. Consider repeat fasting lipids, LFTs at next follow-up visit.   6. CKD stage III: Creatinine was 1.83 in 03/2022, overall stable.   7. Type 2 diabetes: A1c was 6.6 in 03/2022. Monitored and managed per PCP.   8. Disposition: Follow-up in 6-8 weeks.      Lenna Sciara, NP 05/25/2022, 3:36 PM

## 2022-05-24 NOTE — Patient Instructions (Signed)
Medication Instructions:  Start Ranexa 500 mg twice daily  *If you need a refill on your cardiac medications before your next appointment, please call your pharmacy*   Lab Work: NONE ordered at this time of appointment   If you have labs (blood work) drawn today and your tests are completely normal, you will receive your results only by: [place] (if you have MyChart) OR A paper copy in the mail If you have any lab test that is abnormal or we need to change your treatment, we will call you to review the results.   Testing/Procedures: NONE ordered at this time of appointment     Follow-Up: At [facility], you and your health needs are our priority.  As part of our continuing mission to provide you with exceptional heart care, we have created designated Provider Care Teams.  These Care Teams include your primary Cardiologist (physician) and Advanced Practice Providers (APPs -  Physician Assistants and Nurse Practitioners) who all work together to provide you with the care you need, when you need it.  We recommend signing up for the patient portal called "MyChart".  Sign up information is provided on this After Visit Summary.  MyChart is used to connect with patients for Virtual Visits (Telemedicine).  Patients are able to view lab/test results, encounter notes, upcoming appointments, etc.  Non-urgent messages can be sent to your provider as well.   To learn more about what you can do with MyChart, go to [email or web address].    Your next appointment:   6-8 week(s)  Provider:   Diona Browner, NP        Other Instructions

## 2022-05-25 ENCOUNTER — Encounter: Payer: Self-pay | Admitting: Nurse Practitioner

## 2022-06-14 ENCOUNTER — Ambulatory Visit (INDEPENDENT_AMBULATORY_CARE_PROVIDER_SITE_OTHER): Payer: Medicare Other

## 2022-06-14 DIAGNOSIS — I442 Atrioventricular block, complete: Secondary | ICD-10-CM | POA: Diagnosis not present

## 2022-06-14 LAB — CUP PACEART REMOTE DEVICE CHECK
Battery Remaining Longevity: 27 mo
Battery Voltage: 2.89 V
Brady Statistic AP VP Percent: 0.26 %
Brady Statistic AP VS Percent: 0.01 %
Brady Statistic AS VP Percent: 99.73 %
Brady Statistic AS VS Percent: 0 %
Brady Statistic RA Percent Paced: 0.26 %
Brady Statistic RV Percent Paced: 99.99 %
Date Time Interrogation Session: 20240216011927
Implantable Lead Connection Status: 753985
Implantable Lead Connection Status: 753985
Implantable Lead Connection Status: 753985
Implantable Lead Implant Date: 20180315
Implantable Lead Implant Date: 20180315
Implantable Lead Implant Date: 20180315
Implantable Lead Location: 753858
Implantable Lead Location: 753859
Implantable Lead Location: 753860
Implantable Lead Model: 4076
Implantable Lead Model: 4298
Implantable Lead Model: 5076
Implantable Pulse Generator Implant Date: 20180315
Lead Channel Impedance Value: 266 Ohm
Lead Channel Impedance Value: 285 Ohm
Lead Channel Impedance Value: 342 Ohm
Lead Channel Impedance Value: 380 Ohm
Lead Channel Impedance Value: 399 Ohm
Lead Channel Impedance Value: 399 Ohm
Lead Channel Impedance Value: 399 Ohm
Lead Channel Impedance Value: 437 Ohm
Lead Channel Impedance Value: 494 Ohm
Lead Channel Impedance Value: 627 Ohm
Lead Channel Impedance Value: 646 Ohm
Lead Channel Impedance Value: 703 Ohm
Lead Channel Impedance Value: 722 Ohm
Lead Channel Impedance Value: 741 Ohm
Lead Channel Pacing Threshold Amplitude: 1 V
Lead Channel Pacing Threshold Amplitude: 1.25 V
Lead Channel Pacing Threshold Amplitude: 2.5 V
Lead Channel Pacing Threshold Pulse Width: 0.4 ms
Lead Channel Pacing Threshold Pulse Width: 0.4 ms
Lead Channel Pacing Threshold Pulse Width: 0.9 ms
Lead Channel Sensing Intrinsic Amplitude: 1 mV
Lead Channel Sensing Intrinsic Amplitude: 1 mV
Lead Channel Sensing Intrinsic Amplitude: 19.75 mV
Lead Channel Sensing Intrinsic Amplitude: 19.75 mV
Lead Channel Setting Pacing Amplitude: 2.5 V
Lead Channel Setting Pacing Amplitude: 2.5 V
Lead Channel Setting Pacing Amplitude: 2.75 V
Lead Channel Setting Pacing Pulse Width: 0.4 ms
Lead Channel Setting Pacing Pulse Width: 0.9 ms
Lead Channel Setting Sensing Sensitivity: 4 mV
Zone Setting Status: 755011
Zone Setting Status: 755011

## 2022-07-05 ENCOUNTER — Encounter: Payer: Self-pay | Admitting: Nurse Practitioner

## 2022-07-05 ENCOUNTER — Ambulatory Visit: Payer: Medicare Other | Attending: Nurse Practitioner | Admitting: Nurse Practitioner

## 2022-07-05 VITALS — BP 116/56 | HR 79 | Ht 68.0 in | Wt 196.4 lb

## 2022-07-05 DIAGNOSIS — E118 Type 2 diabetes mellitus with unspecified complications: Secondary | ICD-10-CM

## 2022-07-05 DIAGNOSIS — N183 Chronic kidney disease, stage 3 unspecified: Secondary | ICD-10-CM

## 2022-07-05 DIAGNOSIS — Z950 Presence of cardiac pacemaker: Secondary | ICD-10-CM | POA: Diagnosis not present

## 2022-07-05 DIAGNOSIS — I442 Atrioventricular block, complete: Secondary | ICD-10-CM | POA: Diagnosis not present

## 2022-07-05 DIAGNOSIS — I10 Essential (primary) hypertension: Secondary | ICD-10-CM

## 2022-07-05 DIAGNOSIS — R002 Palpitations: Secondary | ICD-10-CM

## 2022-07-05 DIAGNOSIS — I25118 Atherosclerotic heart disease of native coronary artery with other forms of angina pectoris: Secondary | ICD-10-CM

## 2022-07-05 DIAGNOSIS — Z794 Long term (current) use of insulin: Secondary | ICD-10-CM

## 2022-07-05 DIAGNOSIS — E785 Hyperlipidemia, unspecified: Secondary | ICD-10-CM

## 2022-07-05 NOTE — Patient Instructions (Signed)
Medication Instructions:  Your physician recommends that you continue on your current medications as directed. Please refer to the Current Medication list given to you today.   *If you need a refill on your cardiac medications before your next appointment, please call your pharmacy*   Lab Work: Your physician recommends that you return for lab work at your convenience. Fasting lipid panel, lfts   If you have labs (blood work) drawn today and your tests are completely normal, you will receive your results only by: [place] (if you have MyChart) OR A paper copy in the mail If you have any lab test that is abnormal or we need to change your treatment, we will call you to review the results.   Testing/Procedures: NONE ordered at this time of appointment     Follow-Up: At [facility], you and your health needs are our priority.  As part of our continuing mission to provide you with exceptional heart care, we have created designated Provider Care Teams.  These Care Teams include your primary Cardiologist (physician) and Advanced Practice Providers (APPs -  Physician Assistants and Nurse Practitioners) who all work together to provide you with the care you need, when you need it.  We recommend signing up for the patient portal called "MyChart".  Sign up information is provided on this After Visit Summary.  MyChart is used to connect with patients for Virtual Visits (Telemedicine).  Patients are able to view lab/test results, encounter notes, upcoming appointments, etc.  Non-urgent messages can be sent to your provider as well.   To learn more about what you can do with MyChart, go to [email or web address].    Your next appointment:   6-7 month(s)  Provider:   Glenetta Hew, MD     Other Instructions

## 2022-07-05 NOTE — Progress Notes (Signed)
Office Visit    Patient Name: John Dodson Date of Encounter: 07/05/2022  Primary Care Provider:  Kristopher Glee., MD Primary Cardiologist:  Glenetta Hew, MD  Chief Complaint    62 year old male with a history of CAD s/p multiple prior stents, s/p CABG x 4 in 07/2019, CHB s/p biventricular PPM in 06/2016, palpitations, hypertension, hyperlipidemia, PE following COVID-19 infection , CKD stage III and type 2 diabetes who presents for follow-up related to CAD.   Past Medical History    Past Medical History:  Diagnosis Date   Blepharitis of both eyes    Chronic   Cataracts, both eyes    CKD (chronic kidney disease) stage 3, GFR 30-59 ml/min (HCC)    s/p R Nephrectomy/adrenalectomy & DM-Nephropathy (baseline creatinine 1.4-1.5)   COVID-19 virus infection 04/25/2019   Diabetic peripheral neuropathy associated with type 2 diabetes mellitus (HCC)    Essential hypertension    GERD without esophagitis    History of basal cell carcinoma (BCC) excision    History of complete heart block 06/2016   s/p BiV PPM (now followed by Dr. Merlyn Lot - EP)   History of non-ST elevation myocardial infarction (NSTEMI)    And several occasions of unstable angina   Hx of CABGx4 08/25/2019   (Dr. Briant Cedar, [facility]): LIMA-LAD, Seq Rad- OM2-LPDA, RIMA-? OM1 (called Ramus on report, but no ramus seen on cath).   Hyperlipidemia associated with type 2 diabetes mellitus ([place])    on atorvastatin 40 mg.   Multiple Vessel CAD -- s/p PCI of LAD, OM1, LPL1, LPDA & non-dominant RCA --> now S/P CABG x 4 60%   (No cath report prior to 2016) (stents in proxLAD, prox OM1 x 2, prox LPL1 & 3-4 in L PDA & ~2 in  Known non-dominant RCA;  07/2019: MV CAD - patent stents in p-m LAD, p OM1, p LPL & LPDA as well as RCA -> Ost LCx ~60-70% (DFR ++), long mLAD 60% post-stent (DFR ++) --> referred for CABG   Obesity (BMI 30.0-34.9)    Occlusion of left vertebral artery    Consider repossible acute lesion in August 2020 with  presentation of headache.  CTA suggested occluded left vertebral artery throughout the neck.  Faint string-like enhancement intermittently visible suggesting recent occlusion.  Partial reconstruction and posterior fossa.  Left PICA patent.  (Consider possible left vertebral artery dissection)   OSA treated with BiPAP 02/2019   Diagnosis in late 2020 ([facility])--> delayed onset of treatment with BiPAP due to Covid hospitalization followed by PE. ->  BiPAP setting at 21/17 cm   Pulmonary embolism ([place]) 05/18/2019   (1 month following COVID-19 infection): DVT-PE (bilateral PEs noted on VQ scan)-> started on warfarin.  (On warfarin plus Plavix now with aspirin stopped.)   Type 2 diabetes mellitus with complication, with long-term current use of insulin ([place])    On Lantus, Jardiance, Metformin & Onglyza   Past Surgical History:  Procedure Laterality Date   BASAL CELL CARCINOMA EXCISION  01/2015   CAROTID DUPLEX SCAN  12/25/2018   [facility]: Mild plaque in both carotids.  139% bilateral.  Right vertebral flow normal antegrade, Left not seen; normal bilateral subclavian flow..   CHOLECYSTECTOMY     CORONARY ARTERY BYPASS GRAFT N/A 08/25/2019   Procedure: CORONARY ARTERY BYPASS GRAFTING (CABG) TIMES FOUR, ON PUMP, USING LEFT AND RIGHT INTERNAL MAMMARY ARTERY AND LEFT RADIAL ARTERY;  Surgeon: [name], MD;  Location: [facility];  Service: Open Heart  Surgery;; LIMA-LAD, Seq Rad OM2-LPDA, RIMA-OM1   CORONARY STENT INTERVENTION  04/10/2015   ([place]; Bishop Limbo, DO) -> DES PCI mLPDA: Xience Alpine DES 2.5 mm x 18 mm, Xience Alpine DES 2.25 mm x 12 mm overlapping.   CORONARY STENT INTERVENTION  04/04/2016   ([place]; [place], MD)--> (urgent) 100% mLPDA PTCA with 2.25 mm balloon - reduced to 50%; mid OM2 90% - DES PCI (Xience DES  2.5 x 18).    CORONARY STENT INTERVENTION  2015   Prior to December 2016, STENTS noted in prox LAD, proximal OM1, and at  least 2 stents in proximal L PDA   CORONARY STENT INTERVENTION  11/17/2018   ([place]; Bishop Limbo, DO): CULPRIT: mid RCA 90% (DES PCI) -Resolute Onyx DES 2.5 mm x 30 mm --> COMPLICATION: Large left arm hematoma-evaluated by vascular and orthopedic surgery, managed with splint and arm elevation.   CORONARY STENT INTERVENTION  12/28/2018   ([place]; Clarene Critchley, MD, referred by Dr. Mathis Bud) --> INDICATION (urgent, Unstable Angina): Moderate-severe (70%) stenosis of ostial RCA  -> DES PCI Resolute Onyx DES 2.5 mm x 12 mm.    LEFT HEART CATH AND CORONARY ANGIOGRAPHY  04/10/2015   ([place]; Bishop Limbo, DO)--> EF 55%.  Mild inferior HK.  Coronaries-LM: Normal, p LAD STENT 30% ISR, mLAD 20% & diffuse dLAD ~20%; Dom LCx: mCx 20%, ~pOM1 STENT (noted as OM2 in other reports) patent with mid 30%, OM2 normal, prox LPDA "STENTS" patent with SEVERE mid L PDA 90-95% (DES PCI x 2 overlapping); Small-non-dominant RCA  patent    LEFT HEART CATH AND CORONARY ANGIOGRAPHY  04/04/2016   ([place]; [place], MD)--> (urgent): EF 70%.  Previous LAD,~OM2 and proximal PDA stents patent; -> mLAD 35%, dLAD 40%; LCx-OM1 75% (short lesion, med Rx), mid OM2 90% (DES PCI), pLPDA stents patent w/ mPDA 100% (PTCA only - reduced to 50%); non-dom RCA - ost RCA 60% & mRCA 75%   LEFT HEART CATH AND CORONARY ANGIOGRAPHY  11/17/2018   ([place]; Bishop Limbo, DO) indication: Angina.  LM normal; LAD - ost LAD ~50%, pLAD STENT ~30% ISR with dLAD ~40%; Dom LCx - ost & prox Cx 30%, OM1 STENT 20% ISR, OM2 STENT 50% distal edge, pLPDA overlapping STENTS ~20%; nonDom RCA - proxRCA 40%, mid 90% (DES PCI)   LEFT HEART CATH AND CORONARY ANGIOGRAPHY  12/28/2018   ([place]; Clarene Critchley, MD, referred by Dr. Jeneen Rinks [place]) --> INDICATION (urgent, Unstable Angina): Moderate-severe (70%) stenosis of ostial RCA (DES PCI), mRCA 30%.  Otherwise no  significant change from July 2020: dLM 20%, pLAD ~40% ISR , dLAD long/diffuse ~50%; OstLCx 30%, OM1 ~15% OM2 ~30% with patent  LPDA stents/PTCA site.    LEFT HEART CATH AND CORONARY ANGIOGRAPHY N/A 08/20/2019   Procedure: LEFT HEART CATH AND CORONARY ANGIOGRAPHY;  Surgeon: Leonie Man, MD;  Location: [place] [facility];  Service: Cardiovascular;; Multivessel CAD: Patent stents in proximal to mid LAD, proximal OM1, proximal LPL 1 and several stents in L PDA as well as RCA (nondominant).  Ostial LCx 60%-highly DFR +0.84.  There is a long mid LAD 60% stenosis after the stent--highly DFR +0.67.--> CABG   NEPHRECTOMY Right 2012   With adrenalectomy   NM MYOVIEW LTD  08/20/2018   [facility] -> Lexiscan Myoview: EF 81%.  No reversible ischemia or infarction.  Normal wall motion.   PACEMAKER IMPLANT  06/2016  [facility] [place] ([employer]) -> according to CT of chest, leads positioned in right atrium, cardiac apex and coronary sinus (suggesting CRT-P - BiV Pacing))   RADIAL ARTERY HARVEST Left 08/25/2019   Procedure: RADIAL ARTERY HARVEST;  Surgeon: [name], MD;  Location: [place];  Service: Open Heart Surgery;  Laterality: Left;   TEE WITHOUT CARDIOVERSION N/A 08/25/2019   Procedure: TRANSESOPHAGEAL ECHOCARDIOGRAM (TEE);  Surgeon: [name], MD;  Location: [place];  Service: Open Heart Surgery;  Laterality: N/A;   TRANSTHORACIC ECHOCARDIOGRAM  11/2018; 05/01/2019   ([place]) a) EF 60-65%.  Mild TR.;; b)moderate concentric LVH.  EF 55 to 60%.  No R WMA.  Normal RV size and function.  Normal atrial sizes.  Normal valves.    Allergies  Allergies  Allergen Reactions   Latex Rash   Codeine Nausea And Vomiting   Contrast Media [Iodinated Contrast Media]     Reportedly cardiac arrest   Integrilin [Eptifibatide]     Reportedly had shortness of breath, confusion.   Tylenol [Acetaminophen]     Tylenol -3 with codiene   Zithromax [Azithromycin]  Nausea And Vomiting   Glipizide Rash    Headache     Labs/Other Studies Reviewed    The following studies were reviewed today: Lexiscan myoview 09/2021:   The study is normal. The study is low risk.   No ST deviation was noted.   LV perfusion is normal.   Left ventricular function is normal. Nuclear stress EF: 62 %. The left ventricular ejection fraction is normal (55-65%). End diastolic cavity size is normal.   Prior study available for comparison from 08/20/2018.   Low risk stress nuclear study with normal perfusion and normal left ventricular regional and global systolic function.    Recent Labs: No results found for requested labs within last 365 days.  Recent Lipid Panel    Component Value Date/Time   CHOL 122 08/24/2019 0331   TRIG 78 08/25/2019 2046   HDL 29 (L) 08/24/2019 0331   CHOLHDL 4.2 08/24/2019 0331   VLDL 29 08/24/2019 0331   LDLCALC 64 08/24/2019 0331    History of Present Illness    62 year old male with a history of CAD s/p multiple prior stents, CABG x 4 in 07/2019, CHB s/p biventricular PPM in 06/2016, palpitations, hypertension, hyperlipidemia, PE following COVID-19 infection , CKD stage III and type 2 diabetes.   Echocardiogram the time of his bypass surgery showed EF 55 to 60%, normal LV function. Lexiscan in 09/2021 in the setting of chest pain was low risk, EF 62%.  His chest discomfort was thought to be related to his incision/scar pain. He was seen virtually on 02/07/2022 for preoperative cardiac evaluation and was doing well. He was last seen in the office on 05/24/2022 and continued to note intermittent chest discomfort, predominantly at rest, though he did note some exertional symptoms.  He was started on Ranexa 500 mg twice daily.   He presents today for follow-up. Since his last visit he has done well from a cardiac standpoint.  He denies any further chest pain with the addition of Ranexa.  Overall, he reports feeling well.  Home Medications     Current Outpatient Medications  Medication Sig Dispense Refill   albuterol (VENTOLIN HFA) 108 (90 Base) MCG/ACT inhaler Inhale 2 puffs into the lungs every 4 (four) hours as needed for wheezing or shortness of breath.     allopurinol (ZYLOPRIM) 300 MG tablet Take 300 mg by  mouth daily.     amLODipine (NORVASC) 5 MG tablet Take 1 tablet (5 mg total) by mouth daily. 90 tablet 3   atorvastatin (LIPITOR) 40 MG tablet Take 1 tablet (40 mg total) by mouth daily. 90 tablet 3   carvedilol (COREG) 12.5 MG tablet Take 1 tablet (12.5 mg total) by mouth 2 (two) times daily with a meal. 180 tablet 3   clopidogrel (PLAVIX) 75 MG tablet Take 1 tablet (75 mg total) by mouth daily. 90 tablet 3   colchicine 0.6 MG tablet Take 0.6 mg by mouth as needed (gout flare ups).     fluticasone (FLONASE) 50 MCG/ACT nasal spray Place 2 sprays into both nostrils as needed for congestion.     furosemide (LASIX) 40 MG tablet Take 1 tablet (40 mg total) by mouth daily as needed for fluid or edema (Poor more than 3 pound weight gain overnight or more than 5 pound weight gain over baseline). 30 tablet 11   insulin glargine (LANTUS SOLOSTAR) 100 UNIT/ML Solostar Pen Inject 50 Units into the skin every evening. 100UNITS/3 ML     JARDIANCE 25 MG TABS tablet Take 25 mg by mouth daily.     losartan (COZAAR) 25 MG tablet Take 12.5 mg by mouth daily.     MAGNESIUM OXIDE PO Take 500 mg by mouth daily. TAKE 1  TABLET BY MOUTH NIGHTLY FOR LEG CRAMPS     nitroGLYCERIN (NITROSTAT) 0.4 MG SL tablet Place 1 tablet (0.4 mg total) under the tongue every 5 (five) minutes as needed for chest pain. 25 tablet 6   Omega-3 Fatty Acids (FISH OIL BURP-LESS) 1000 MG CAPS Take 1,000 mg by mouth 2 (two) times daily.     OZEMPIC, 0.25 OR 0.5 MG/DOSE, 2 MG/1.5ML SOPN Inject 0.5 mg into the skin once a week.     pantoprazole (PROTONIX) 40 MG tablet Take 40 mg by mouth daily.     ranolazine (RANEXA) 500 MG 12 hr tablet Take 1 tablet (500 mg total) by mouth 2  (two) times daily. 180 tablet 3   traMADol (ULTRAM) 50 MG tablet Take 50 mg by mouth every 6 (six) hours as needed for pain.     No current facility-administered medications for this visit.     Review of Systems    He denies chest pain, palpitations, dyspnea, pnd, orthopnea, n, v, dizziness, syncope, edema, weight gain, or early satiety. All other systems reviewed and are otherwise negative except as noted above.   Physical Exam    VS:  BP (!) 116/56   Pulse 79   Ht '5\' 8"'$  (1.727 m)   Wt 196 lb 6.4 oz (89.1 kg)   SpO2 99%   BMI 29.86 kg/m   GEN: Well nourished, well developed, in no acute distress. HEENT: normal. Neck: Supple, no JVD, carotid bruits, or masses. Cardiac: RRR, no murmurs, rubs, or gallops. No clubbing, cyanosis, edema.  Radials/DP/PT 2+ and equal bilaterally.  Respiratory:  Respirations regular and unlabored, clear to auscultation bilaterally. GI: Soft, nontender, nondistended, BS + x 4. MS: no deformity or atrophy. Skin: warm and dry, no rash. Neuro:  Strength and sensation are intact. Psych: Normal affect.  Accessory Clinical Findings    ECG personally reviewed by me today - No EKG in office today.    Lab Results  Component Value Date   WBC 8.4 11/24/2019   HGB 11.8 (L) 11/24/2019   HCT 38.7 (L) 11/24/2019   MCV 78.7 (L) 11/24/2019   PLT 244 11/24/2019  Lab Results  Component Value Date   CREATININE 1.55 (H) 11/25/2019   BUN 19 11/25/2019   NA 141 11/25/2019   K 4.3 11/25/2019   CL 103 11/25/2019   CO2 27 11/25/2019   Lab Results  Component Value Date   ALT 25 08/25/2019   AST 16 08/25/2019   ALKPHOS 74 08/25/2019   BILITOT 1.1 08/25/2019   Lab Results  Component Value Date   CHOL 122 08/24/2019   HDL 29 (L) 08/24/2019   LDLCALC 64 08/24/2019   TRIG 78 08/25/2019   CHOLHDL 4.2 08/24/2019    Lab Results  Component Value Date   HGBA1C 7.8 (H) 11/24/2019    Assessment & Plan    1. CAD: S/p multiple prior stents (per pt 12 in  total), CABG x 4 in 2021.  He has had chronic intermittent chest discomfort, more frequent over the past 6 months He did not tolerate Imdur in the past due to severe headache. Lexiscan in 09/2021 in the setting of chest pain was low risk, EF 62%.  Symptoms resolved with reintroduction of Ranexa-he denies any recent angina.  If he notes symptoms in the future, consider ischemic evaluation with cardiac PET stress test. Continue Plavix, amlodipine, carvedilol, losartan, Jardiance, Lasix, Ranexa and Lipitor.   2. CHB: S/p PPM in 2018.  Follows with EP.   3. Palpitations: Denies any recent palpitations.  Stable on carvedilol.   4. Hypertension: BP well controlled. Continue current antihypertensive regimen.    5. Hyperlipidemia: LDL was 58 in 06/2021. Will repeat fasting lipids, LFTs.  Continue Lipitor.    6. CKD stage III: Creatinine was 1.83 in 03/2022, overall stable.    7. Type 2 diabetes: A1c was 6.6 in 03/2022. Monitored and managed per PCP.    8. Disposition: Follow-up in 6 months, sooner if needed.      Lenna Sciara, NP 07/05/2022, 10:53 AM

## 2022-07-09 NOTE — Progress Notes (Signed)
Remote pacemaker transmission.   

## 2022-07-11 ENCOUNTER — Telehealth: Payer: Self-pay

## 2022-07-11 ENCOUNTER — Other Ambulatory Visit: Payer: Self-pay

## 2022-07-11 DIAGNOSIS — Z79899 Other long term (current) drug therapy: Secondary | ICD-10-CM

## 2022-07-11 DIAGNOSIS — I10 Essential (primary) hypertension: Secondary | ICD-10-CM

## 2022-07-11 DIAGNOSIS — I25118 Atherosclerotic heart disease of native coronary artery with other forms of angina pectoris: Secondary | ICD-10-CM

## 2022-07-11 DIAGNOSIS — I2510 Atherosclerotic heart disease of native coronary artery without angina pectoris: Secondary | ICD-10-CM

## 2022-07-11 LAB — LIPID PANEL
Chol/HDL Ratio: 4.6 ratio (ref 0.0–5.0)
Cholesterol, Total: 182 mg/dL (ref 100–199)
HDL: 40 mg/dL (ref >39)
LDL Chol Calc (NIH): 107 mg/dL — ABNORMAL HIGH (ref 0–99)
Triglycerides: 200 mg/dL — ABNORMAL HIGH (ref 0–149)
VLDL Cholesterol Cal: 35 mg/dL (ref 5–40)

## 2022-07-11 LAB — HEPATIC FUNCTION PANEL
ALT: 14 IU/L (ref 0–44)
AST: 17 IU/L (ref 0–40)
Albumin: 4.5 g/dL (ref 3.8–4.9)
Alkaline Phosphatase: 121 IU/L (ref 44–121)
Bilirubin Total: 0.3 mg/dL (ref 0.0–1.2)
Bilirubin, Direct: 0.11 mg/dL (ref 0.00–0.40)
Total Protein: 7.1 g/dL (ref 6.0–8.5)

## 2022-07-11 MED ORDER — ATORVASTATIN CALCIUM 80 MG PO TABS: 80.0000 mg | ORAL_TABLET | Freq: Every day | ORAL | 3 refills | Status: DC

## 2022-07-11 NOTE — Telephone Encounter (Signed)
Spoke with pt. Pt was notified of lab results and recommendations. Pt will repeat labs in 6-8 weeks, increase Lipitor 80 mg daily and f/u as planned.

## 2022-07-30 ENCOUNTER — Ambulatory Visit: Payer: Medicare Other | Attending: Internal Medicine | Admitting: Internal Medicine

## 2022-07-30 ENCOUNTER — Encounter: Payer: Self-pay | Admitting: Internal Medicine

## 2022-07-30 VITALS — BP 122/62 | HR 76 | Ht 68.0 in | Wt 194.0 lb

## 2022-07-30 DIAGNOSIS — I2510 Atherosclerotic heart disease of native coronary artery without angina pectoris: Secondary | ICD-10-CM | POA: Diagnosis not present

## 2022-07-30 DIAGNOSIS — Z950 Presence of cardiac pacemaker: Secondary | ICD-10-CM

## 2022-07-30 DIAGNOSIS — I10 Essential (primary) hypertension: Secondary | ICD-10-CM

## 2022-07-30 DIAGNOSIS — I442 Atrioventricular block, complete: Secondary | ICD-10-CM

## 2022-07-30 DIAGNOSIS — Z9861 Coronary angioplasty status: Secondary | ICD-10-CM

## 2022-07-30 NOTE — Patient Instructions (Signed)
Medication Instructions:  Your physician recommends that you continue on your current medications as directed. Please refer to the Current Medication list given to you today.  *If you need a refill on your cardiac medications before your next appointment, please call your pharmacy*  Lab Work: None ordered.  If you have labs (blood work) drawn today and your tests are completely normal, you will receive your results only by: [place] (if you have MyChart) OR A paper copy in the mail If you have any lab test that is abnormal or we need to change your treatment, we will call you to review the results.  Testing/Procedures: None ordered.  Follow-Up: At [facility], you and your health needs are our priority.  As part of our continuing mission to provide you with exceptional heart care, we have created designated Provider Care Teams.  These Care Teams include your primary Cardiologist (physician) and Advanced Practice Providers (APPs -  Physician Assistants and Nurse Practitioners) who all work together to provide you with the care you need, when you need it.  We recommend signing up for the patient portal called "MyChart".  Sign up information is provided on this After Visit Summary.  MyChart is used to connect with patients for Virtual Visits (Telemedicine).  Patients are able to view lab/test results, encounter notes, upcoming appointments, etc.  Non-urgent messages can be sent to your provider as well.   To learn more about what you can do with MyChart, go to [email or web address].    Your next appointment:   1 year(s)  The format for your next appointment:   In Person  Provider:   Cristopher [place], MD{or one of the following Advanced Practice Providers on your designated Care Team:   Tommye Standard, [place] Legrand Como "Jonni Sanger" Chalmers Cater, [place]  Remote monitoring is used to monitor your Pacemaker from home. This monitoring reduces the number of office visits required to check your device to  one time per year. It allows [place] to keep an eye on the functioning of your device to ensure it is working properly. You are scheduled for a device check from home on 09/13/22. You may send your transmission at any time that day. If you have a wireless device, the transmission will be sent automatically. After your physician reviews your transmission, you will receive a postcard with your next transmission date.

## 2022-07-30 NOTE — Progress Notes (Signed)
HPI John Dodson returns today for ongoing evaluation and management. He is a pleasant 62 yo man with an ICM, chronic systolic CHF and CHB, s/p PPM insertion. The patient has decided to transfer care from [facility] to [place]. He denies chest pain or sob. No syncope. No edema.  Allergies  Allergen Reactions   Latex Rash   Codeine Nausea And Vomiting   Contrast Media [Iodinated Contrast Media]     Reportedly cardiac arrest   Integrilin [Eptifibatide]     Reportedly had shortness of breath, confusion.   Tylenol [Acetaminophen]     Tylenol -3 with codiene   Zithromax [Azithromycin] Nausea And Vomiting   Glipizide Rash    Headache     Current Outpatient Medications  Medication Sig Dispense Refill   albuterol (VENTOLIN HFA) 108 (90 Base) MCG/ACT inhaler Inhale 2 puffs into the lungs every 4 (four) hours as needed for wheezing or shortness of breath.     allopurinol (ZYLOPRIM) 300 MG tablet Take 300 mg by mouth daily.     amLODipine (NORVASC) 5 MG tablet Take 1 tablet (5 mg total) by mouth daily. 90 tablet 3   atorvastatin (LIPITOR) 80 MG tablet Take 1 tablet (80 mg total) by mouth daily. 90 tablet 3   carvedilol (COREG) 12.5 MG tablet Take 1 tablet (12.5 mg total) by mouth 2 (two) times daily with a meal. 180 tablet 3   clopidogrel (PLAVIX) 75 MG tablet Take 1 tablet (75 mg total) by mouth daily. 90 tablet 3   colchicine 0.6 MG tablet Take 0.6 mg by mouth as needed (gout flare ups).     fluticasone (FLONASE) 50 MCG/ACT nasal spray Place 2 sprays into both nostrils as needed for congestion.     furosemide (LASIX) 40 MG tablet Take 1 tablet (40 mg total) by mouth daily as needed for fluid or edema (Poor more than 3 pound weight gain overnight or more than 5 pound weight gain over baseline). 30 tablet 11   insulin glargine (LANTUS SOLOSTAR) 100 UNIT/ML Solostar Pen Inject 50 Units into the skin every evening. 100UNITS/3 ML     JARDIANCE 25 MG TABS tablet Take 25 mg by mouth daily.      losartan (COZAAR) 25 MG tablet Take 12.5 mg by mouth daily.     MAGNESIUM OXIDE PO Take 500 mg by mouth daily. TAKE 1  TABLET BY MOUTH NIGHTLY FOR LEG CRAMPS     nitroGLYCERIN (NITROSTAT) 0.4 MG SL tablet Place 1 tablet (0.4 mg total) under the tongue every 5 (five) minutes as needed for chest pain. 25 tablet 6   Omega-3 Fatty Acids (FISH OIL BURP-LESS) 1000 MG CAPS Take 1,000 mg by mouth 2 (two) times daily.     OZEMPIC, 0.25 OR 0.5 MG/DOSE, 2 MG/1.5ML SOPN Inject 0.5 mg into the skin once a week.     pantoprazole (PROTONIX) 40 MG tablet Take 40 mg by mouth daily.     ranolazine (RANEXA) 500 MG 12 hr tablet Take 1 tablet (500 mg total) by mouth 2 (two) times daily. 180 tablet 3   traMADol (ULTRAM) 50 MG tablet Take 50 mg by mouth every 6 (six) hours as needed for pain.     gabapentin (NEURONTIN) 100 MG capsule Take 100 mg by mouth.     No current facility-administered medications for this visit.     Past Medical History:  Diagnosis Date   Blepharitis of both eyes    Chronic   Cataracts, both eyes  CKD (chronic kidney disease) stage 3, GFR 30-59 ml/min    s/p R Nephrectomy/adrenalectomy & DM-Nephropathy (baseline creatinine 1.4-1.5)   COVID-19 virus infection 04/25/2019   Diabetic peripheral neuropathy associated with type 2 diabetes mellitus    Essential hypertension    GERD without esophagitis    History of basal cell carcinoma (BCC) excision    History of complete heart block 06/2016   s/p BiV PPM (now followed by Dr. Merlyn Lot - EP)   History of non-ST elevation myocardial infarction (NSTEMI)    And several occasions of unstable angina   Hx of CABGx4 08/25/2019   (Dr. Briant Cedar, [facility]): LIMA-LAD, Seq Rad- OM2-LPDA, RIMA-? OM1 (called Ramus on report, but no ramus seen on cath).   Hyperlipidemia associated with type 2 diabetes mellitus    on atorvastatin 40 mg.   Multiple Vessel CAD -- s/p PCI of LAD, OM1, LPL1, LPDA & non-dominant RCA --> now S/P CABG x 4 60%   (No cath  report prior to 2016) (stents in proxLAD, prox OM1 x 2, prox LPL1 & 3-4 in L PDA & ~2 in  Known non-dominant RCA;  07/2019: MV CAD - patent stents in p-m LAD, p OM1, p LPL & LPDA as well as RCA -> Ost LCx ~60-70% (DFR ++), long mLAD 60% post-stent (DFR ++) --> referred for CABG   Obesity (BMI 30.0-34.9)    Occlusion of left vertebral artery    Consider repossible acute lesion in August 2020 with presentation of headache.  CTA suggested occluded left vertebral artery throughout the neck.  Faint string-like enhancement intermittently visible suggesting recent occlusion.  Partial reconstruction and posterior fossa.  Left PICA patent.  (Consider possible left vertebral artery dissection)   OSA treated with BiPAP 02/2019   Diagnosis in late 2020 ([facility])--> delayed onset of treatment with BiPAP due to Covid hospitalization followed by PE. ->  BiPAP setting at 21/17 cm   Pulmonary embolism 05/18/2019   (1 month following COVID-19 infection): DVT-PE (bilateral PEs noted on VQ scan)-> started on warfarin.  (On warfarin plus Plavix now with aspirin stopped.)   Type 2 diabetes mellitus with complication, with long-term current use of insulin    On Lantus, Jardiance, Metformin & Onglyza    ROS:   All systems reviewed and negative except as noted in the HPI.   Past Surgical History:  Procedure Laterality Date   BASAL CELL CARCINOMA EXCISION  01/2015   CAROTID DUPLEX SCAN  12/25/2018   [facility]: Mild plaque in both carotids.  139% bilateral.  Right vertebral flow normal antegrade, Left not seen; normal bilateral subclavian flow..   CHOLECYSTECTOMY     CORONARY ARTERY BYPASS GRAFT N/A 08/25/2019   Procedure: CORONARY ARTERY BYPASS GRAFTING (CABG) TIMES FOUR, ON PUMP, USING LEFT AND RIGHT INTERNAL MAMMARY ARTERY AND LEFT RADIAL ARTERY;  Surgeon: [name], MD;  Location: [place] [facility];  Service: Open Heart Surgery;; LIMA-LAD, Seq Rad OM2-LPDA, RIMA-OM1   CORONARY STENT INTERVENTION   04/10/2015   ([place]; Bishop Limbo, DO) -> DES PCI mLPDA: Xience Alpine DES 2.5 mm x 18 mm, Xience Alpine DES 2.25 mm x 12 mm overlapping.   CORONARY STENT INTERVENTION  04/04/2016   ([place]; [place], MD)--> (urgent) 100% mLPDA PTCA with 2.25 mm balloon - reduced to 50%; mid OM2 90% - DES PCI (Xience DES  2.5 x 18).    CORONARY STENT INTERVENTION  2015   Prior to December 2016, STENTS noted in prox LAD,  proximal OM1, and at least 2 stents in proximal L PDA   CORONARY STENT INTERVENTION  11/17/2018   ([place]; Bishop Limbo, DO): CULPRIT: mid RCA 90% (DES PCI) -Resolute Onyx DES 2.5 mm x 30 mm --> COMPLICATION: Large left arm hematoma-evaluated by vascular and orthopedic surgery, managed with splint and arm elevation.   CORONARY STENT INTERVENTION  12/28/2018   ([place]; Clarene Critchley, MD, referred by Dr. Mathis Bud) --> INDICATION (urgent, Unstable Angina): Moderate-severe (70%) stenosis of ostial RCA  -> DES PCI Resolute Onyx DES 2.5 mm x 12 mm.    LEFT HEART CATH AND CORONARY ANGIOGRAPHY  04/10/2015   ([place]; Bishop Limbo, DO)--> EF 55%.  Mild inferior HK.  Coronaries-LM: Normal, p LAD STENT 30% ISR, mLAD 20% & diffuse dLAD ~20%; Dom LCx: mCx 20%, ~pOM1 STENT (noted as OM2 in other reports) patent with mid 30%, OM2 normal, prox LPDA "STENTS" patent with SEVERE mid L PDA 90-95% (DES PCI x 2 overlapping); Small-non-dominant RCA  patent    LEFT HEART CATH AND CORONARY ANGIOGRAPHY  04/04/2016   ([place]; [place], [place])--> (urgent): EF 70%.  Previous LAD,~OM2 and proximal PDA stents patent; -> mLAD 35%, dLAD 40%; LCx-OM1 75% (short lesion, med Rx), mid OM2 90% (DES PCI), pLPDA stents patent w/ mPDA 100% (PTCA only - reduced to 50%); non-dom RCA - ost RCA 60% & mRCA 75%   LEFT HEART CATH AND CORONARY ANGIOGRAPHY  11/17/2018   ([place]; Bishop Limbo, DO) indication: Angina.  LM normal; LAD  - ost LAD ~50%, pLAD STENT ~30% ISR with dLAD ~40%; Dom LCx - ost & prox Cx 30%, OM1 STENT 20% ISR, OM2 STENT 50% distal edge, pLPDA overlapping STENTS ~20%; nonDom RCA - proxRCA 40%, mid 90% (DES PCI)   LEFT HEART CATH AND CORONARY ANGIOGRAPHY  12/28/2018   ([place]; Clarene Critchley, MD, referred by Dr. Jeneen Rinks [place]) --> INDICATION (urgent, Unstable Angina): Moderate-severe (70%) stenosis of ostial RCA (DES PCI), mRCA 30%.  Otherwise no significant change from July 2020: dLM 20%, pLAD ~40% ISR , dLAD long/diffuse ~50%; OstLCx 30%, OM1 ~15% OM2 ~30% with patent  LPDA stents/PTCA site.    LEFT HEART CATH AND CORONARY ANGIOGRAPHY N/A 08/20/2019   Procedure: LEFT HEART CATH AND CORONARY ANGIOGRAPHY;  Surgeon: Leonie Man, MD;  Location: [place] [facility];  Service: Cardiovascular;; Multivessel CAD: Patent stents in proximal to mid LAD, proximal OM1, proximal LPL 1 and several stents in L PDA as well as RCA (nondominant).  Ostial LCx 60%-highly DFR +0.84.  There is a long mid LAD 60% stenosis after the stent--highly DFR +0.67.--> CABG   NEPHRECTOMY Right 2012   With adrenalectomy   NM MYOVIEW LTD  08/20/2018   [facility] -> Lexiscan Myoview: EF 81%.  No reversible ischemia or infarction.  Normal wall motion.   PACEMAKER IMPLANT  06/2016   [facility] [place] ([employer]) -> according to CT of chest, leads positioned in right atrium, cardiac apex and coronary sinus (suggesting CRT-P - BiV Pacing))   RADIAL ARTERY HARVEST Left 08/25/2019   Procedure: RADIAL ARTERY HARVEST;  Surgeon: [name], MD;  Location: [place];  Service: Open Heart Surgery;  Laterality: Left;   TEE WITHOUT CARDIOVERSION N/A 08/25/2019   Procedure: TRANSESOPHAGEAL ECHOCARDIOGRAM (TEE);  Surgeon: [name], MD;  Location: [place];  Service: Open Heart Surgery;  Laterality: N/A;   TRANSTHORACIC ECHOCARDIOGRAM  11/2018; 05/01/2019   [facility]) a) EF 60-65%.  Mild TR.;;  b)moderate concentric LVH.  EF 55 to 60%.  No R WMA.  Normal RV size and function.  Normal atrial sizes.  Normal valves.     Family History  Problem Relation Age of Onset   Stroke Mother    Hypertension Mother    Hyperlipidemia Mother    Hypertension Father    Hyperlipidemia Father    Coronary artery disease Father      Social History   Socioeconomic History   Marital status: Married    Spouse name: Not on file   Number of children: Not on file   Years of education: Not on file   Highest education level: Not on file  Occupational History   Not on file  Tobacco Use   Smoking status: Never   Smokeless tobacco: Never  Vaping Use   Vaping Use: Never used  Substance and Sexual Activity   Alcohol use: No   Drug use: No   Sexual activity: Not on file  Other Topics Concern   Not on file  Social History Narrative   Not on file   Social Determinants of Health   Financial Resource Strain: Not on file  Food Insecurity: Not on file  Transportation Needs: Not on file  Physical Activity: Not on file  Stress: Not on file  Social Connections: Not on file  Intimate Partner Violence: Not on file     BP 122/62   Pulse 76   Ht 5\' 8"  (1.727 m)   Wt 194 lb (88 kg)   SpO2 96%   BMI 29.50 kg/m   Physical Exam:  Well appearing 62 yo man, NAD HEENT: Unremarkable Neck:  No JVD, no thyromegally Lymphatics:  No adenopathy Back:  No CVA tenderness Lungs:  Clear with no wheezes HEART:  Regular rate rhythm, no murmurs, no rubs, no clicks Abd:  soft, positive bowel sounds, no organomegally, no rebound, no guarding Ext:  2 plus pulses, no edema, no cyanosis, no clubbing Skin:  No rashes no nodules Neuro:  CN II through XII intact, motor grossly intact  EKG - nsr with biv pacing  DEVICE  Normal device function.  See PaceArt for details.   Assess/Plan:  1. CHB - he has no escape. He is asymptomatic, s/p PPM insertion.  2. PPM - his medtronic Biv PPM is working  normally. 3.CAD - he denies anginal symptoms. We will follow.  4. HTN - his bp is well controlled. No change in meds.   Salome Spotted.

## 2022-07-31 LAB — CUP PACEART INCLINIC DEVICE CHECK
Battery Remaining Longevity: 24 mo
Battery Voltage: 2.88 V
Brady Statistic AP VP Percent: 0.96 %
Brady Statistic AP VS Percent: 0 %
Brady Statistic AS VP Percent: 99.03 %
Brady Statistic AS VS Percent: 0.01 %
Brady Statistic RA Percent Paced: 0.95 %
Brady Statistic RV Percent Paced: 99.99 %
Date Time Interrogation Session: 20240402152900
Implantable Lead Connection Status: 753985
Implantable Lead Connection Status: 753985
Implantable Lead Connection Status: 753985
Implantable Lead Implant Date: 20180315
Implantable Lead Implant Date: 20180315
Implantable Lead Implant Date: 20180315
Implantable Lead Location: 753858
Implantable Lead Location: 753859
Implantable Lead Location: 753860
Implantable Lead Model: 4076
Implantable Lead Model: 4298
Implantable Lead Model: 5076
Implantable Pulse Generator Implant Date: 20180315
Lead Channel Impedance Value: 285 Ohm
Lead Channel Impedance Value: 323 Ohm
Lead Channel Impedance Value: 380 Ohm
Lead Channel Impedance Value: 418 Ohm
Lead Channel Impedance Value: 437 Ohm
Lead Channel Impedance Value: 437 Ohm
Lead Channel Impedance Value: 513 Ohm
Lead Channel Impedance Value: 513 Ohm
Lead Channel Impedance Value: 665 Ohm
Lead Channel Impedance Value: 684 Ohm
Lead Channel Impedance Value: 836 Ohm
Lead Channel Impedance Value: 912 Ohm
Lead Channel Impedance Value: 950 Ohm
Lead Channel Impedance Value: 969 Ohm
Lead Channel Pacing Threshold Amplitude: 1.25 V
Lead Channel Pacing Threshold Amplitude: 1.25 V
Lead Channel Pacing Threshold Amplitude: 1.375 V
Lead Channel Pacing Threshold Amplitude: 2 V
Lead Channel Pacing Threshold Pulse Width: 0.4 ms
Lead Channel Pacing Threshold Pulse Width: 0.4 ms
Lead Channel Pacing Threshold Pulse Width: 0.4 ms
Lead Channel Pacing Threshold Pulse Width: 0.9 ms
Lead Channel Sensing Intrinsic Amplitude: 1 mV
Lead Channel Sensing Intrinsic Amplitude: 1.125 mV
Lead Channel Sensing Intrinsic Amplitude: 19.75 mV
Lead Channel Sensing Intrinsic Amplitude: 19.75 mV
Lead Channel Setting Pacing Amplitude: 2 V
Lead Channel Setting Pacing Amplitude: 2.5 V
Lead Channel Setting Pacing Amplitude: 2.5 V
Lead Channel Setting Pacing Pulse Width: 0.4 ms
Lead Channel Setting Pacing Pulse Width: 0.9 ms
Lead Channel Setting Sensing Sensitivity: 4 mV
Zone Setting Status: 755011
Zone Setting Status: 755011

## 2022-08-21 ENCOUNTER — Other Ambulatory Visit: Payer: Self-pay

## 2022-08-21 MED ORDER — FUROSEMIDE 40 MG PO TABS: 40.0000 mg | ORAL_TABLET | Freq: Every day | ORAL | 3 refills | Status: DC | PRN

## 2022-08-21 MED ORDER — NITROGLYCERIN 0.4 MG SL SUBL: 0.4000 mg | SUBLINGUAL_TABLET | SUBLINGUAL | 2 refills | Status: DC | PRN

## 2022-09-13 ENCOUNTER — Ambulatory Visit (INDEPENDENT_AMBULATORY_CARE_PROVIDER_SITE_OTHER): Payer: Medicare Other

## 2022-09-13 DIAGNOSIS — I442 Atrioventricular block, complete: Secondary | ICD-10-CM

## 2022-09-13 LAB — CUP PACEART REMOTE DEVICE CHECK
Battery Remaining Longevity: 26 mo
Battery Voltage: 2.88 V
Brady Statistic AP VP Percent: 0.16 %
Brady Statistic AP VS Percent: 0 %
Brady Statistic AS VP Percent: 99.83 %
Brady Statistic AS VS Percent: 0 %
Brady Statistic RA Percent Paced: 0.16 %
Brady Statistic RV Percent Paced: 100 %
Date Time Interrogation Session: 20240517022037
Implantable Lead Connection Status: 753985
Implantable Lead Connection Status: 753985
Implantable Lead Connection Status: 753985
Implantable Lead Implant Date: 20180315
Implantable Lead Implant Date: 20180315
Implantable Lead Implant Date: 20180315
Implantable Lead Location: 753858
Implantable Lead Location: 753859
Implantable Lead Location: 753860
Implantable Lead Model: 4076
Implantable Lead Model: 4298
Implantable Lead Model: 5076
Implantable Pulse Generator Implant Date: 20180315
Lead Channel Impedance Value: 266 Ohm
Lead Channel Impedance Value: 304 Ohm
Lead Channel Impedance Value: 380 Ohm
Lead Channel Impedance Value: 399 Ohm
Lead Channel Impedance Value: 399 Ohm
Lead Channel Impedance Value: 418 Ohm
Lead Channel Impedance Value: 456 Ohm
Lead Channel Impedance Value: 456 Ohm
Lead Channel Impedance Value: 513 Ohm
Lead Channel Impedance Value: 741 Ohm
Lead Channel Impedance Value: 741 Ohm
Lead Channel Impedance Value: 760 Ohm
Lead Channel Impedance Value: 760 Ohm
Lead Channel Impedance Value: 798 Ohm
Lead Channel Pacing Threshold Amplitude: 1.25 V
Lead Channel Pacing Threshold Amplitude: 1.375 V
Lead Channel Pacing Threshold Amplitude: 2.375 V
Lead Channel Pacing Threshold Pulse Width: 0.4 ms
Lead Channel Pacing Threshold Pulse Width: 0.4 ms
Lead Channel Pacing Threshold Pulse Width: 0.9 ms
Lead Channel Sensing Intrinsic Amplitude: 1.125 mV
Lead Channel Sensing Intrinsic Amplitude: 1.125 mV
Lead Channel Sensing Intrinsic Amplitude: 19.75 mV
Lead Channel Sensing Intrinsic Amplitude: 19.75 mV
Lead Channel Setting Pacing Amplitude: 2 V
Lead Channel Setting Pacing Amplitude: 2.5 V
Lead Channel Setting Pacing Amplitude: 2.5 V
Lead Channel Setting Pacing Pulse Width: 0.4 ms
Lead Channel Setting Pacing Pulse Width: 0.9 ms
Lead Channel Setting Sensing Sensitivity: 4 mV
Zone Setting Status: 755011
Zone Setting Status: 755011

## 2022-09-25 NOTE — Progress Notes (Signed)
Remote pacemaker transmission.   

## 2022-10-13 ENCOUNTER — Other Ambulatory Visit: Payer: Self-pay | Admitting: Internal Medicine

## 2022-12-13 ENCOUNTER — Ambulatory Visit: Payer: Medicare Other

## 2022-12-13 DIAGNOSIS — I442 Atrioventricular block, complete: Secondary | ICD-10-CM | POA: Diagnosis not present

## 2022-12-13 LAB — CUP PACEART REMOTE DEVICE CHECK
Battery Remaining Longevity: 23 mo
Battery Voltage: 2.87 V
Brady Statistic AP VP Percent: 1.42 %
Brady Statistic AP VS Percent: 0 %
Brady Statistic AS VP Percent: 98.57 %
Brady Statistic AS VS Percent: 0 %
Brady Statistic RA Percent Paced: 1.41 %
Brady Statistic RV Percent Paced: 99.99 %
Date Time Interrogation Session: 20240816022309
Implantable Lead Connection Status: 753985
Implantable Lead Connection Status: 753985
Implantable Lead Connection Status: 753985
Implantable Lead Implant Date: 20180315
Implantable Lead Implant Date: 20180315
Implantable Lead Implant Date: 20180315
Implantable Lead Location: 753858
Implantable Lead Location: 753859
Implantable Lead Location: 753860
Implantable Lead Model: 4076
Implantable Lead Model: 4298
Implantable Lead Model: 5076
Implantable Pulse Generator Implant Date: 20180315
Lead Channel Impedance Value: 266 Ohm
Lead Channel Impedance Value: 304 Ohm
Lead Channel Impedance Value: 418 Ohm
Lead Channel Impedance Value: 418 Ohm
Lead Channel Impedance Value: 418 Ohm
Lead Channel Impedance Value: 437 Ohm
Lead Channel Impedance Value: 437 Ohm
Lead Channel Impedance Value: 475 Ohm
Lead Channel Impedance Value: 513 Ohm
Lead Channel Impedance Value: 722 Ohm
Lead Channel Impedance Value: 722 Ohm
Lead Channel Impedance Value: 760 Ohm
Lead Channel Impedance Value: 760 Ohm
Lead Channel Impedance Value: 779 Ohm
Lead Channel Pacing Threshold Amplitude: 1.25 V
Lead Channel Pacing Threshold Amplitude: 1.375 V
Lead Channel Pacing Threshold Amplitude: 2.5 V
Lead Channel Pacing Threshold Pulse Width: 0.4 ms
Lead Channel Pacing Threshold Pulse Width: 0.4 ms
Lead Channel Pacing Threshold Pulse Width: 0.9 ms
Lead Channel Sensing Intrinsic Amplitude: 1 mV
Lead Channel Sensing Intrinsic Amplitude: 1 mV
Lead Channel Sensing Intrinsic Amplitude: 19.75 mV
Lead Channel Sensing Intrinsic Amplitude: 19.75 mV
Lead Channel Setting Pacing Amplitude: 2 V
Lead Channel Setting Pacing Amplitude: 2.5 V
Lead Channel Setting Pacing Amplitude: 2.5 V
Lead Channel Setting Pacing Pulse Width: 0.4 ms
Lead Channel Setting Pacing Pulse Width: 0.9 ms
Lead Channel Setting Sensing Sensitivity: 4 mV
Zone Setting Status: 755011
Zone Setting Status: 755011

## 2022-12-23 NOTE — Progress Notes (Signed)
Remote pacemaker transmission.   

## 2023-01-02 ENCOUNTER — Encounter: Payer: Self-pay | Admitting: Nurse Practitioner

## 2023-01-02 ENCOUNTER — Ambulatory Visit: Payer: Medicare Other | Attending: Nurse Practitioner | Admitting: Nurse Practitioner

## 2023-01-02 VITALS — BP 120/68 | HR 63 | Ht 68.0 in | Wt 198.2 lb

## 2023-01-02 DIAGNOSIS — Z951 Presence of aortocoronary bypass graft: Secondary | ICD-10-CM

## 2023-01-02 DIAGNOSIS — E785 Hyperlipidemia, unspecified: Secondary | ICD-10-CM

## 2023-01-02 DIAGNOSIS — Z794 Long term (current) use of insulin: Secondary | ICD-10-CM

## 2023-01-02 DIAGNOSIS — N183 Chronic kidney disease, stage 3 unspecified: Secondary | ICD-10-CM

## 2023-01-02 DIAGNOSIS — I2510 Atherosclerotic heart disease of native coronary artery without angina pectoris: Secondary | ICD-10-CM

## 2023-01-02 DIAGNOSIS — Z950 Presence of cardiac pacemaker: Secondary | ICD-10-CM | POA: Diagnosis not present

## 2023-01-02 DIAGNOSIS — E118 Type 2 diabetes mellitus with unspecified complications: Secondary | ICD-10-CM

## 2023-01-02 DIAGNOSIS — I10 Essential (primary) hypertension: Secondary | ICD-10-CM

## 2023-01-02 DIAGNOSIS — R002 Palpitations: Secondary | ICD-10-CM

## 2023-01-02 DIAGNOSIS — I442 Atrioventricular block, complete: Secondary | ICD-10-CM

## 2023-01-02 NOTE — Patient Instructions (Signed)
Medication Instructions:  Your physician recommends that you continue on your current medications as directed. Please refer to the Current Medication list given to you today. *If you need a refill on your cardiac medications before your next appointment, please call your pharmacy*   Lab Work: Fasting Lipids and Cmet If you have labs (blood work) drawn today and your tests are completely normal, you will receive your results only by: MyChart Message (if you have MyChart) OR A paper copy in the mail If you have any lab test that is abnormal or we need to change your treatment, we will call you to review the results.   Testing/Procedures: No testing   Follow-Up: At [facility], you and your health needs are our priority.  As part of our continuing mission to provide you with exceptional heart care, we have created designated Provider Care Teams.  These Care Teams include your primary Cardiologist (physician) and Advanced Practice Providers (APPs -  Physician Assistants and Nurse Practitioners) who all work together to provide you with the care you need, when you need it.  We recommend signing up for the patient portal called "MyChart".  Sign up information is provided on this After Visit Summary.  MyChart is used to connect with patients for Virtual Visits (Telemedicine).  Patients are able to view lab/test results, encounter notes, upcoming appointments, etc.  Non-urgent messages can be sent to your provider as well.   To learn more about what you can do with MyChart, go to [email or web address].    Your next appointment:   6 month(s)  Provider:   Bryan Lemma, MD

## 2023-01-02 NOTE — Progress Notes (Signed)
Office Visit    Patient Name: John Dodson Date of Encounter: 01/02/2023  Primary Care Provider:  Andreas Blower., MD Primary Cardiologist:  Bryan Lemma, MD  Chief Complaint    62 year old male with a history of CAD s/p multiple prior stents, s/p CABG x 4 in 07/2019, CHB s/p biventricular PPM in 06/2016, palpitations, hypertension, hyperlipidemia, PE following COVID-19 infection , CKD stage III and type 2 diabetes who presents for follow-up related to CAD.   Past Medical History    Past Medical History:  Diagnosis Date   Blepharitis of both eyes    Chronic   Cataracts, both eyes    CKD (chronic kidney disease) stage 3, GFR 30-59 ml/min (HCC)    s/p R Nephrectomy/adrenalectomy & DM-Nephropathy (baseline creatinine 1.4-1.5)   COVID-19 virus infection 04/25/2019   Diabetic peripheral neuropathy associated with type 2 diabetes mellitus (HCC)    Essential hypertension    GERD without esophagitis    History of basal cell carcinoma (BCC) excision    History of complete heart block 06/2016   s/p BiV PPM (now followed by Dr. Hardie Shackleton - EP)   History of non-ST elevation myocardial infarction (NSTEMI)    And several occasions of unstable angina   Hx of CABGx4 08/25/2019   (Dr. Morrell Riddle, [name]): LIMA-LAD, Seq Rad- OM2-LPDA, RIMA-? OM1 (called Ramus on report, but no ramus seen on cath).   Hyperlipidemia associated with type 2 diabetes mellitus (HCC)    on atorvastatin 40 mg.   Multiple Vessel CAD -- s/p PCI of LAD, OM1, LPL1, LPDA & non-dominant RCA --> now S/P CABG x 4 60%   (No cath report prior to 2016) (stents in proxLAD, prox OM1 x 2, prox LPL1 & 3-4 in L PDA & ~2 in  Known non-dominant RCA;  07/2019: MV CAD - patent stents in p-m LAD, p OM1, p LPL & LPDA as well as RCA -> Ost LCx ~60-70% (DFR ++), long mLAD 60% post-stent (DFR ++) --> referred for CABG   Obesity (BMI 30.0-34.9)    Occlusion of left vertebral artery    Consider repossible acute lesion in August 2020 with  presentation of headache.  CTA suggested occluded left vertebral artery throughout the neck.  Faint string-like enhancement intermittently visible suggesting recent occlusion.  Partial reconstruction and posterior fossa.  Left PICA patent.  (Consider possible left vertebral artery dissection)   OSA treated with BiPAP 02/2019   Diagnosis in late 2020 ([facility])--> delayed onset of treatment with BiPAP due to Covid hospitalization followed by PE. ->  BiPAP setting at 21/17 cm   Pulmonary embolism (HCC) 05/18/2019   (1 month following COVID-19 infection): DVT-PE (bilateral PEs noted on VQ scan)-> started on warfarin.  (On warfarin plus Plavix now with aspirin stopped.)   Type 2 diabetes mellitus with complication, with long-term current use of insulin (HCC)    On Lantus, Jardiance, Metformin & Onglyza   Past Surgical History:  Procedure Laterality Date   BASAL CELL CARCINOMA EXCISION  01/2015   CAROTID DUPLEX SCAN  12/25/2018   [facility]: Mild plaque in both carotids.  139% bilateral.  Right vertebral flow normal antegrade, Left not seen; normal bilateral subclavian flow..   CHOLECYSTECTOMY     CORONARY ARTERY BYPASS GRAFT N/A 08/25/2019   Procedure: CORONARY ARTERY BYPASS GRAFTING (CABG) TIMES FOUR, ON PUMP, USING LEFT AND RIGHT INTERNAL MAMMARY ARTERY AND LEFT RADIAL ARTERY;  Surgeon: Linden Dolin, MD;  Location: [facility] OR;  Service: Open  Heart Surgery;; LIMA-LAD, Seq Rad OM2-LPDA, RIMA-OM1   CORONARY STENT INTERVENTION  04/10/2015   [facility] [place]; Tollie Pizza, DO) -> DES PCI mLPDA: Xience Alpine DES 2.5 mm x 18 mm, Xience Alpine DES 2.25 mm x 12 mm overlapping.   CORONARY STENT INTERVENTION  04/04/2016   [facility] [place]; [place], MD)--> (urgent) 100% mLPDA PTCA with 2.25 mm balloon - reduced to 50%; mid OM2 90% - DES PCI (Xience DES  2.5 x 18).    CORONARY STENT INTERVENTION  2015   Prior to December 2016, STENTS noted in prox LAD, proximal OM1, and at  least 2 stents in proximal L PDA   CORONARY STENT INTERVENTION  11/17/2018   [facility] [place]; Tollie Pizza, DO): CULPRIT: mid RCA 90% (DES PCI) -Resolute Onyx DES 2.5 mm x 30 mm --> COMPLICATION: Large left arm hematoma-evaluated by vascular and orthopedic surgery, managed with splint and arm elevation.   CORONARY STENT INTERVENTION  12/28/2018   [facility] [place]; Cathlean Cower, MD, referred by Dr. Ginger Carne) --> INDICATION (urgent, Unstable Angina): Moderate-severe (70%) stenosis of ostial RCA  -> DES PCI Resolute Onyx DES 2.5 mm x 12 mm.    LEFT HEART CATH AND CORONARY ANGIOGRAPHY  04/10/2015   [facility] [place]; Tollie Pizza, DO)--> EF 55%.  Mild inferior HK.  Coronaries-LM: Normal, p LAD STENT 30% ISR, mLAD 20% & diffuse dLAD ~20%; Dom LCx: mCx 20%, ~pOM1 STENT (noted as OM2 in other reports) patent with mid 30%, OM2 normal, prox LPDA "STENTS" patent with SEVERE mid L PDA 90-95% (DES PCI x 2 overlapping); Small-non-dominant RCA  patent    LEFT HEART CATH AND CORONARY ANGIOGRAPHY  04/04/2016   [facility] [place]; [place], MD)--> (urgent): EF 70%.  Previous LAD,~OM2 and proximal PDA stents patent; -> mLAD 35%, dLAD 40%; LCx-OM1 75% (short lesion, med Rx), mid OM2 90% (DES PCI), pLPDA stents patent w/ mPDA 100% (PTCA only - reduced to 50%); non-dom RCA - ost RCA 60% & mRCA 75%   LEFT HEART CATH AND CORONARY ANGIOGRAPHY  11/17/2018   [facility] [place]; Tollie Pizza, DO) indication: Angina.  LM normal; LAD - ost LAD ~50%, pLAD STENT ~30% ISR with dLAD ~40%; Dom LCx - ost & prox Cx 30%, OM1 STENT 20% ISR, OM2 STENT 50% distal edge, pLPDA overlapping STENTS ~20%; nonDom RCA - proxRCA 40%, mid 90% (DES PCI)   LEFT HEART CATH AND CORONARY ANGIOGRAPHY  12/28/2018   [facility] [place]; Cathlean Cower, MD, referred by Dr. Fayrene Fearing [place]) --> INDICATION (urgent, Unstable Angina): Moderate-severe (70%) stenosis of ostial RCA (DES PCI), mRCA 30%.  Otherwise no  significant change from July 2020: dLM 20%, pLAD ~40% ISR , dLAD long/diffuse ~50%; OstLCx 30%, OM1 ~15% OM2 ~30% with patent  LPDA stents/PTCA site.    LEFT HEART CATH AND CORONARY ANGIOGRAPHY N/A 08/20/2019   Procedure: LEFT HEART CATH AND CORONARY ANGIOGRAPHY;  Surgeon: Marykay Lex, MD;  Location: [facility] INV[facility] CV LAB;  Service: Cardiovascular;; Multivessel CAD: Patent stents in proximal to mid LAD, proximal OM1, proximal LPL 1 and several stents in L PDA as well as RCA (nondominant).  Ostial LCx 60%-highly DFR +0.84.  There is a long mid LAD 60% stenosis after the stent--highly DFR +0.67.--> CABG   NEPHRECTOMY Right 2012   With adrenalectomy   NM MYOVIEW LTD  08/20/2018   [facility] -> Lexiscan Myoview: EF 81%.  No reversible ischemia or infarction.  Normal wall motion.   PACEMAKER IMPLANT  06/2016   [facility] [place] ([employer]) -> according to CT of chest, leads positioned in right atrium, cardiac apex and coronary sinus (suggesting CRT-P - BiV Pacing))   RADIAL ARTERY HARVEST Left 08/25/2019   Procedure: RADIAL ARTERY HARVEST;  Surgeon: Linden Dolin, MD;  Location: [facility] OR;  Service: Open Heart Surgery;  Laterality: Left;   TEE WITHOUT CARDIOVERSION N/A 08/25/2019   Procedure: TRANSESOPHAGEAL ECHOCARDIOGRAM (TEE);  Surgeon: Linden Dolin, MD;  Location: [facility] OR;  Service: Open Heart Surgery;  Laterality: N/A;   TRANSTHORACIC ECHOCARDIOGRAM  11/2018; 05/01/2019   [facility]) a) EF 60-65%.  Mild TR.;; b)moderate concentric LVH.  EF 55 to 60%.  No R WMA.  Normal RV size and function.  Normal atrial sizes.  Normal valves.    Allergies  Allergies  Allergen Reactions   Latex Rash   Codeine Nausea And Vomiting   Contrast Media [Iodinated Contrast Media]     Reportedly cardiac arrest   Integrilin [Eptifibatide]     Reportedly had shortness of breath, confusion.   Tylenol [Acetaminophen]     Tylenol -3 with codiene   Zithromax [Azithromycin]  Nausea And Vomiting   Glipizide Rash    Headache     Labs/Other Studies Reviewed    The following studies were reviewed today:  Cardiac Studies & Procedures   CARDIAC CATHETERIZATION  CARDIAC CATHETERIZATION 08/20/2019  Narrative  The left ventricular systolic function is normal. The left ventricular ejection fraction is 55-65% by visual estimate. LV end diastolic pressure is normal.LV end diastolic pressure is normal.  -------------------  There is stent from ostial to almost distal small nondominant RCA: Ost RCA to Mid RCA stent is diffusely 35% stenosed.  Ost Cx to Prox Cx lesion is 60% stenosed.  Ost LAD to Mid LAD stent is 10% stenosed. Mid LAD to Dist LAD lesion is 60% stenosed.  Previously placed 2nd Mrg stent (DES) is widely patent.  Lat 2nd Mrg lesion is 80% stenosed. Previously described.  Previously placed 1st LPL stent (DES) is widely patent.  LPAV lesion is 50% stenosed.  LPDA stent is 5% stenosed.   Multivessel CAD with essentially patent stents in proximal to mid LAD, proximal OM1, proximal LPL1, several stents in the L PDA as well as RCA that is a small nondominant vessel.  No obvious lesions within stents noted.  Ostial LCx roughly 60% --> highly DFR positive with 0.84  Long mid LAD 60% after stent--highly DFR +0.67-0.73  Normal LVEDP  With significant LAD as well as ostial LCx disease--best course of action is CVTS consultation for CABG.    Bryan Lemma, MD  Findings Coronary Findings Diagnostic  Dominance: Left  Left Main Vessel is large.  Left Anterior Descending Ost LAD to Mid LAD lesion is 10% stenosed. The lesion was previously treated using a drug eluting stent over 2 years ago. At least 2 stents Mid LAD to Dist LAD lesion is 60% stenosed. The lesion is distal to major branch, segmental, eccentric and irregular. The stenosis was measured by a visual reading. Pressure wire/FFR was performed on the lesion. DFR 0.67 and 0.74 Highly  significant DFR  Third Diagonal Branch Vessel is small in size.  Left Circumflex Vessel is large. Ost Cx to Prox Cx lesion is 60% stenosed. The lesion is focal. Pressure wire/FFR was performed on the lesion. DFR 0.84 Angiographically does not seem to be significant, however highly significant by DFR  Second Obtuse Marginal Branch Previously placed 2nd Mrg stent (unknown  type) is widely patent.  Lateral Second Obtuse Marginal Branch Vessel is small in size. Considered to be a small OM by prior cath films.  Difficult to determine if it sidebranch or true OM Lat 2nd Mrg lesion is 80% stenosed. The lesion is focal. Previously described a 75 to 80%  Left Posterior Descending Artery Vessel is large in size. LPDA lesion is 5% stenosed. The lesion was previously treated using a drug eluting stent over 2 years ago. Several overlapping stents  First Left Posterolateral Branch Vessel is large in size. Previously placed 1st LPL stent (unknown type) is widely patent.  Left Posterior Atrioventricular Artery Vessel is large in size. LPAV lesion is 50% stenosed. The lesion is located at the bend.  Right Coronary Artery Vessel is small. Ost RCA to Mid RCA lesion is 35% stenosed. Diffuse The lesion was previously treated using a drug eluting stent between 6-12 months ago. At least 2 if not 3 overlapping DES stents Previously placed stent displays restenosis.  Acute Marginal Branch Vessel is small in size.  Right Ventricular Branch Vessel is small in size.  Right Posterior Descending Artery Vessel is small in size.  Intervention  No interventions have been documented.   CARDIAC CATHETERIZATION  CARDIAC CATHETERIZATION 11/17/2018   STRESS TESTS  MYOCARDIAL PERFUSION IMAGING 10/03/2021  Narrative   The study is normal. The study is low risk.   No ST deviation was noted.   LV perfusion is normal.   Left ventricular function is normal. Nuclear stress EF: 62 %. The left ventricular  ejection fraction is normal (55-65%). End diastolic cavity size is normal.   Prior study available for comparison from 08/20/2018.  Low risk stress nuclear study with normal perfusion and normal left ventricular regional and global systolic function.   ECHOCARDIOGRAM  ECHOCARDIOGRAM LIMITED 08/27/2019  Narrative ECHOCARDIOGRAM LIMITED REPORT    Patient Name:   John Dodson Date of Exam: 08/27/2019 Medical Rec #:  [id number]           Height:       68.0 in Accession #:    [id number]          Weight:       225.7 lb Date of Birth:  Jun 06, 1960          BSA:          2.152 m Patient Age:    58 years            BP:           104/82 mmHg Patient Gender: M                   HR:           92 bpm. Exam Location:  Inpatient  Procedure: Limited Echo  STAT ECHO  Indications:    Tamponade 423.3  History:        Patient has prior history of Echocardiogram examinations, most recent 08/25/2019. CAD, Prior CABG, Signs/Symptoms:Chest Pain; Risk Factors:Hypertension, Diabetes, Dyslipidemia and Non-Smoker. Pulmonary embolism.  Sonographer:    Renella Cunas RDCS Referring Phys: [id number] [facility] Z ATKINS   Sonographer Comments: Technically difficult study due to poor echo windows. IMPRESSIONS   1. No significant pericardial effusion noted. Likely prominent prominent epicardial fat pad. Given limited views - EF appears normal RV is normal size.  FINDINGS Pericardium: No significant pericardial effusion noted. Likely prominent prominent epicardial fat pad. Given limited views - EF appears normal RV is normal size.  Charlton Haws MD  Electronically signed by Charlton Haws MD Signature Date/Time: 08/27/2019/8:15:20 AM    Final   TEE  ECHO INTRAOPERATIVE TEE 08/25/2019  Narrative *INTRAOPERATIVE TRANSESOPHAGEAL REPORT *    Patient Name:   John Dodson Date of Exam: 08/25/2019 Medical Rec #:  [id number]           Height:       68.0 in Accession #:    [id number]          Weight:        208.2 lb Date of Birth:  [birth year]          BSA:          2.08 m Patient Age:    58 years            BP:           134/75 mmHg Patient Gender: M                   HR:           77 bpm. Exam Location:  Anesthesiology  Transesophogeal exam was perform intraoperatively during surgical procedure. Patient was closely monitored under general anesthesia during the entirety of examination.  Indications:     CABG Sonographer:     Celene Skeen RDCS (AE) Performing Phys: [id number] BROADUS Z ATKINS  Complications: No known complications during this procedure. POST-OP IMPRESSIONS - Left Ventricle: The left ventricle is unchanged from pre-bypass. - Right Ventricle: The right ventricle appears unchanged from pre-bypass. - Aorta: The aorta appears unchanged from pre-bypass. - Left Atrium: The left atrium appears unchanged from pre-bypass. - Left Atrial Appendage: The left atrial appendage appears unchanged from pre-bypass. - Aortic Valve: The aortic valve appears unchanged from pre-bypass. - Mitral Valve: The mitral valve appears unchanged from pre-bypass. - Tricuspid Valve: There is mild regurgitation. - Interatrial Septum: The interatrial septum appears unchanged from pre-bypass. - Interventricular Septum: The interventricular septum appears unchanged from pre-bypass. - Pericardium: The pericardium appears unchanged from pre-bypass.  PRE-OP FINDINGS Left Ventricle: The left ventricle has normal systolic function, with an ejection fraction of 55-60%. The cavity size was normal. There is mild concentric left ventricular hypertrophy.  Right Ventricle: The right ventricle has normal systolic function. The cavity was normal. There is no increase in right ventricular wall thickness.  Left Atrium: Left atrial size was normal in size.  Right Atrium: Right atrial size was normal in size. Right atrial pressure is estimated at 10 mmHg.  Interatrial Septum: No atrial level shunt detected by color  flow Doppler.  Pericardium: There is no evidence of pericardial effusion.  Mitral Valve: The mitral valve is normal in structure. Mitral valve regurgitation is trivial by color flow Doppler.  Tricuspid Valve: The tricuspid valve was normal in structure. Tricuspid valve regurgitation is trivial by color flow Doppler.  Aortic Valve: The aortic valve is normal in structure. Aortic valve regurgitation was not visualized by color flow Doppler. There is no stenosis of the aortic valve, with a calculated valve area of 1.70 cm.  Pulmonic Valve: The pulmonic valve was normal in structure. Pulmonic valve regurgitation is not visualized by color flow Doppler.   Aorta: The aortic root, ascending aorta and aortic arch are normal in size and structure. There is evidence of plaque in the descending aorta.  +--------------+--------++ LEFT VENTRICLE         +--------------+--------++ PLAX 2D                +--------------+--------++ LVOT diam:  1.90 cm  +--------------+--------++ LVOT Area:    2.84 cm +--------------+--------++                        +--------------+--------++  +------------------+------------++ AORTIC VALVE                   +------------------+------------++ AV Area (Vmax):   1.58 cm     +------------------+------------++ AV Area (Vmean):  1.59 cm     +------------------+------------++ AV Area (VTI):    1.70 cm     +------------------+------------++ AV Vmax:          147.00 cm/s  +------------------+------------++ AV Vmean:         101.000 cm/s +------------------+------------++ AV VTI:           0.286 m      +------------------+------------++ AV Peak Grad:     8.6 mmHg     +------------------+------------++ AV Mean Grad:     5.0 mmHg     +------------------+------------++ LVOT Vmax:        82.00 cm/s   +------------------+------------++ LVOT Vmean:       56.800 cm/s   +------------------+------------++ LVOT VTI:         0.171 m      +------------------+------------++ LVOT/AV VTI ratio:0.60         +------------------+------------++   +--------------+-------+ SHUNTS                +--------------+-------+ Systemic VTI: 0.17 m  +--------------+-------+ Systemic Diam:1.90 cm +--------------+-------+   Marcene Duos MD Electronically signed by Marcene Duos MD Signature Date/Time: 08/26/2019/9:42:49 PM    Final   MONITORS  LONG TERM MONITOR (3-14 DAYS) 06/13/2021  Narrative  Underlying predominant rhythm is Sinus Rhythm with an average rate of 84 bpm and a rate range of 60 to 118 bpm  Very rare PACs and PVCs noted.  There are also noted appropriate pacing as well as occasional episodes where there is pacing with intrinsic beats also noted.   Patch Wear Time:  14 days and 0 hours (2023-01-23T07:23:41-0500 to 2023-02-06T07:23:45-0500)   Overall, pretty normal monitor.  Will need EP to assess pacemaker to see if there is any true arrhythmias.  Bryan Lemma, MD          Recent Labs: 07/10/2022: ALT 14  Recent Lipid Panel    Component Value Date/Time   CHOL 182 07/10/2022 0853   TRIG 200 (H) 07/10/2022 0853   HDL 40 07/10/2022 0853   CHOLHDL 4.6 07/10/2022 0853   CHOLHDL 4.2 08/24/2019 0331   VLDL 29 08/24/2019 0331   LDLCALC 107 (H) 07/10/2022 0853    History of Present Illness    62 year old male with a history of CAD s/p multiple prior stents, CABG x 4 in 07/2019, CHB s/p biventricular PPM in 06/2016, palpitations, hypertension, hyperlipidemia, PE following COVID-19 infection , CKD stage III and type 2 diabetes.   Echocardiogram the time of his bypass surgery showed EF 55 to 60%, normal LV function. Lexiscan in 09/2021 in the setting of chest pain was low risk, EF 62%.  His chest discomfort was thought to be related to his incision/scar pain. At his follow-up visit in January 2024 he continued to  note intermittent chest discomfort, predominantly at rest, though he did note some exertional symptoms.  He was started on Ranexa 500 mg twice daily with improvement in his symptoms. He was last seen in the office on 07/30/2022 by Dr. Ladona Ridgel and was doing well. He denied symptoms concerning for angina.  He presents today for follow-up. Since his last visit he has done well from a cardiac standpoint.  He notes occasional twinges in his chest, relieved with burping, he thinks this may be related to indigestion.  He denies any other symptoms concerning for angina, denies exertional symptoms.  Overall, he reports feeling well.   Home Medications    Current Outpatient Medications  Medication Sig Dispense Refill   albuterol (VENTOLIN HFA) 108 (90 Base) MCG/ACT inhaler Inhale 2 puffs into the lungs every 4 (four) hours as needed for wheezing or shortness of breath.     allopurinol (ZYLOPRIM) 300 MG tablet Take 300 mg by mouth daily.     amLODipine (NORVASC) 5 MG tablet Take 1 tablet (5 mg total) by mouth daily. 90 tablet 3   atorvastatin (LIPITOR) 80 MG tablet Take 1 tablet (80 mg total) by mouth daily. 90 tablet 3   carvedilol (COREG) 12.5 MG tablet Take 1 tablet (12.5 mg total) by mouth 2 (two) times daily with a meal. 180 tablet 3   clopidogrel (PLAVIX) 75 MG tablet TAKE 1 TABLET BY MOUTH DAILY 90 tablet 3   colchicine 0.6 MG tablet Take 0.6 mg by mouth as needed (gout flare ups).     fluticasone (FLONASE) 50 MCG/ACT nasal spray Place 2 sprays into both nostrils as needed for congestion.     furosemide (LASIX) 40 MG tablet Take 1 tablet (40 mg total) by mouth daily as needed for fluid or edema (Poor more than 3 pound weight gain overnight or more than 5 pound weight gain over baseline). 90 tablet 3   gabapentin (NEURONTIN) 100 MG capsule Take 100 mg by mouth.     insulin glargine (LANTUS SOLOSTAR) 100 UNIT/ML Solostar Pen Inject 50 Units into the skin every evening. 100UNITS/3 ML     JARDIANCE 25 MG  TABS tablet Take 25 mg by mouth daily.     losartan (COZAAR) 25 MG tablet Take 12.5 mg by mouth daily.     MAGNESIUM OXIDE PO Take 500 mg by mouth daily. TAKE 1  TABLET BY MOUTH NIGHTLY FOR LEG CRAMPS     nitroGLYCERIN (NITROSTAT) 0.4 MG SL tablet Place 1 tablet (0.4 mg total) under the tongue every 5 (five) minutes as needed for chest pain. 75 tablet 2   Omega-3 Fatty Acids (FISH OIL BURP-LESS) 1000 MG CAPS Take 1,000 mg by mouth 2 (two) times daily.     pantoprazole (PROTONIX) 40 MG tablet Take 40 mg by mouth daily.     ranolazine (RANEXA) 500 MG 12 hr tablet Take 1 tablet (500 mg total) by mouth 2 (two) times daily. 180 tablet 3   traMADol (ULTRAM) 50 MG tablet Take 50 mg by mouth every 6 (six) hours as needed for pain.     OZEMPIC, 0.25 OR 0.5 MG/DOSE, 2 MG/1.5ML SOPN Inject 0.5 mg into the skin once a week. (Patient not taking: Reported on 01/02/2023)     No current facility-administered medications for this visit.     Review of Systems    He denies chest pain, palpitations, dyspnea, pnd, orthopnea, n, v, dizziness, syncope, edema, weight gain, or early satiety. All other systems reviewed and are otherwise negative except as noted above.   Physical Exam    VS:  BP 120/68 (BP Location: Left Arm, Patient Position: Sitting, Cuff Size: Normal)   Pulse 63   Ht 5\' 8"  (1.727 m)   Wt 198 lb 3.2 oz (89.9 kg)   SpO2 99%   BMI 30.14  kg/m   GEN: Well nourished, well developed, in no acute distress. HEENT: normal. Neck: Supple, no JVD, carotid bruits, or masses. Cardiac: RRR, no murmurs, rubs, or gallops. No clubbing, cyanosis, edema.  Radials/DP/PT 2+ and equal bilaterally.  Respiratory:  Respirations regular and unlabored, clear to auscultation bilaterally. GI: Soft, nontender, nondistended, BS + x 4. MS: no deformity or atrophy. Skin: warm and dry, no rash. Neuro:  Strength and sensation are intact. Psych: Normal affect.  Accessory Clinical Findings    ECG personally reviewed by me  today -    - no EKG in office today.    Lab Results  Component Value Date   WBC 8.4 11/24/2019   HGB 11.8 (L) 11/24/2019   HCT 38.7 (L) 11/24/2019   MCV 78.7 (L) 11/24/2019   PLT 244 11/24/2019   Lab Results  Component Value Date   CREATININE 1.55 (H) 11/25/2019   BUN 19 11/25/2019   NA 141 11/25/2019   K 4.3 11/25/2019   CL 103 11/25/2019   CO2 27 11/25/2019   Lab Results  Component Value Date   ALT 14 07/10/2022   AST 17 07/10/2022   ALKPHOS 121 07/10/2022   BILITOT 0.3 07/10/2022   Lab Results  Component Value Date   CHOL 182 07/10/2022   HDL 40 07/10/2022   LDLCALC 107 (H) 07/10/2022   TRIG 200 (H) 07/10/2022   CHOLHDL 4.6 07/10/2022    Lab Results  Component Value Date   HGBA1C 7.8 (H) 11/24/2019    Assessment & Plan    1. CAD: S/p multiple prior stents (per pt 12 in total), CABG x 4 in 2021.  He had chronic intermittent chest discomfort, did not tolerate Imdur in the past due to severe headache. Lexiscan in 09/2021 in the setting of chest pain was low risk, EF 62%.  Symptoms resolved with reintroduction of Ranexa-he denies any recent angina.  If he notes symptoms in the future, consider ischemic evaluation with cardiac PET stress test. Continue Plavix, amlodipine, carvedilol, losartan, Jardiance, Lasix, Ranexa and Lipitor.   2. CHB: S/p PPM in 2018.  Most recent device interrogation was stable.  Follows with EP.   3. Palpitations: Denies any recent palpitations.  Stable on carvedilol.   4. Hypertension: BP well controlled. Continue current antihypertensive regimen.    5. Hyperlipidemia: LDL was 107 in 06/2022.  Lipitor was increased to 80 mg daily.  Will repeat fasting lipids, LFTs.  If LDL remains elevated above goal, consider referral to lipid clinic Pharm.D. for consideration of PCSK9 inhibitor.  Continue Lipitor.    6. CKD stage III: Creatinine was 1.83 in 03/2022, overall stable.    7. Type 2 diabetes: A1c was 6.6 in 03/2022. Monitored and managed per  PCP.    8. Disposition: Follow-up in 6 months, sooner if needed.       Joylene Grapes, NP 01/02/2023, 11:08 AM

## 2023-01-04 LAB — LIPID PANEL
Chol/HDL Ratio: 3.9 ratio (ref 0.0–5.0)
Cholesterol, Total: 153 mg/dL (ref 100–199)
HDL: 39 mg/dL — ABNORMAL LOW (ref >39)
LDL Chol Calc (NIH): 80 mg/dL (ref 0–99)
Triglycerides: 201 mg/dL — ABNORMAL HIGH (ref 0–149)
VLDL Cholesterol Cal: 34 mg/dL (ref 5–40)

## 2023-01-04 LAB — COMPREHENSIVE METABOLIC PANEL
ALT: 16 IU/L (ref 0–44)
AST: 17 IU/L (ref 0–40)
Albumin: 4.6 g/dL (ref 3.8–4.9)
Alkaline Phosphatase: 111 IU/L (ref 44–121)
BUN/Creatinine Ratio: 21 (ref 10–24)
BUN: 36 mg/dL — ABNORMAL HIGH (ref 8–27)
Bilirubin Total: 0.7 mg/dL (ref 0.0–1.2)
CO2: 26 mmol/L (ref 20–29)
Calcium: 9.6 mg/dL (ref 8.6–10.2)
Chloride: 104 mmol/L (ref 96–106)
Creatinine, Ser: 1.72 mg/dL — ABNORMAL HIGH (ref 0.76–1.27)
Globulin, Total: 2.5 g/dL (ref 1.5–4.5)
Glucose: 100 mg/dL — ABNORMAL HIGH (ref 70–99)
Potassium: 4.6 mmol/L (ref 3.5–5.2)
Sodium: 143 mmol/L (ref 134–144)
Total Protein: 7.1 g/dL (ref 6.0–8.5)
eGFR: 45 mL/min/{1.73_m2} — ABNORMAL LOW (ref >59)

## 2023-01-08 ENCOUNTER — Telehealth: Payer: Self-pay

## 2023-01-08 ENCOUNTER — Other Ambulatory Visit: Payer: Self-pay | Admitting: Cardiology

## 2023-01-08 DIAGNOSIS — E7800 Pure hypercholesterolemia, unspecified: Secondary | ICD-10-CM

## 2023-01-08 NOTE — Telephone Encounter (Signed)
Spoke with pt. Pt was notified of lab results. Pt voiced understanding. Ref placed for Pharm-D. Pt will continue current medication and f/u.

## 2023-01-16 ENCOUNTER — Telehealth: Payer: Self-pay

## 2023-01-16 ENCOUNTER — Ambulatory Visit
Payer: Medicare Other | Attending: Cardiovascular Disease | Admitting: Pharmacist Clinician (PhC)/ Clinical Pharmacy Specialist

## 2023-01-16 ENCOUNTER — Encounter: Payer: Self-pay | Admitting: Pharmacist Clinician (PhC)/ Clinical Pharmacy Specialist

## 2023-01-16 ENCOUNTER — Other Ambulatory Visit (HOSPITAL_COMMUNITY): Payer: Self-pay

## 2023-01-16 ENCOUNTER — Telehealth: Payer: Self-pay | Admitting: Pharmacist Clinician (PhC)/ Clinical Pharmacy Specialist

## 2023-01-16 DIAGNOSIS — E785 Hyperlipidemia, unspecified: Secondary | ICD-10-CM | POA: Diagnosis not present

## 2023-01-16 DIAGNOSIS — E1169 Type 2 diabetes mellitus with other specified complication: Secondary | ICD-10-CM | POA: Diagnosis not present

## 2023-01-16 MED ORDER — REPATHA SURECLICK 140 MG/ML ~~LOC~~ SOAJ: 140.0000 mg | SUBCUTANEOUS | 12 refills | Status: AC

## 2023-01-16 NOTE — Telephone Encounter (Signed)
Pharmacy Patient Advocate Encounter   Received notification from Physician's Office that prior authorization for REPATHA is required/requested.   Insurance verification completed.   The patient is insured through [facility] .   Per test claim: PA required; PA submitted to [facility] via CoverMyMeds Key/confirmation #/EOC [id number] Status is pending

## 2023-01-16 NOTE — Telephone Encounter (Signed)
Pharmacy Patient Advocate Encounter  Received notification from [facility] that Prior Authorization for REPATHA has been APPROVED from 01/16/23 to 07/16/23. Ran test claim, Copay is $141.27 (PATIENT IN COVERAGE GAP (DONUT HOLE)). This test claim was processed through [facility]- copay amounts may vary at other pharmacies due to pharmacy/plan contracts, or as the patient moves through the different stages of their insurance plan.

## 2023-01-16 NOTE — Assessment & Plan Note (Signed)
Assessment: Patient with ASCVD not at LDL goal of < 55 Most recent LDL 80 on 01/03/23 Has been compliant with high intensity statin: atorvastatin 80  Reviewed options for lowering LDL cholesterol, including ezetimibe, PCSK-9 inhibitors, bempedoic acid and inclisiran.  Discussed mechanisms of action, dosing, side effects, potential decreases in LDL cholesterol and costs.  Also reviewed potential options for patient assistance.  Plan: Patient agreeable to starting Repatha 140 mg q14d Repeat labs after:  3 months Lipid Liver function Does not qualify for [employer]

## 2023-01-16 NOTE — Telephone Encounter (Signed)
Please do PA for Repatha

## 2023-01-16 NOTE — Telephone Encounter (Signed)
PA request has been Submitted. New Encounter created for follow up. For additional info see Pharmacy Prior Auth telephone encounter from 01/16/23.

## 2023-01-16 NOTE — Addendum Note (Signed)
Addended by: Rosalee Kaufman on: 01/16/2023 04:35 PM   Modules accepted: Orders

## 2023-01-16 NOTE — Progress Notes (Signed)
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   Tylenol [Acetaminophen]     Tylenol -3 with codiene   Zithromax [Azithromycin] Nausea And Vomiting   Glipizide Rash    Headache    History of Present Illness    John Dodson is a 62 y.o. male patient of Dr Herbie Baltimore, in the office today to discuss options for cholesterol management.  Insurance Carrier:  [employer] AAPR  Repatha $47/month, $142 in coverage gap  (does not qualify for [employer])  Pharmacy:  [facility] [place] [address]  LDL Cholesterol goal:  LDL < 55  Current Medications:   atorvastatin 80  Family Hx: father had CABG twice, multiple stents, now in his 82's; mother had stroke at 66, passed; brother has stent, other 3 siblings without heart issues;  2 children 54, 59 - son with hypertension (39)  Social Hx: Tobacco: no Alcohol: no  Diet:  trying to watch diet for DM2,  mostly beef and chicken for protein; vegetables usually fresh, lots of salads; does snack on chips  Exercise: walks some  Accessory Clinical Findings   Lab Results  Component Value Date   CHOL 153 01/03/2023   HDL 39 (L) 01/03/2023   LDLCALC 80  01/03/2023   TRIG 201 (H) 01/03/2023   CHOLHDL 3.9 01/03/2023    No results found for: "LIPOA"  Lab Results  Component Value Date   ALT 16 01/03/2023   AST 17 01/03/2023   ALKPHOS 111 01/03/2023   BILITOT 0.7 01/03/2023   Lab Results  Component Value Date   CREATININE 1.72 (H) 01/03/2023   BUN 36 (H) 01/03/2023   NA 143 01/03/2023   K 4.6 01/03/2023   CL 104 01/03/2023   CO2 26 01/03/2023   Lab Results  Component Value Date   HGBA1C 7.8 (H) 11/24/2019    Home Medications    Current Outpatient Medications  Medication Sig Dispense Refill   albuterol (VENTOLIN HFA) 108 (90 Base) MCG/ACT inhaler Inhale 2 puffs into the lungs every 4 (four) hours as needed for wheezing or shortness of breath.     allopurinol (ZYLOPRIM) 300 MG tablet Take 300 mg by mouth daily.     atorvastatin (LIPITOR) 80 MG tablet Take 1 tablet (80 mg total) by mouth daily. 90 tablet 3   Capsicum, Cayenne, (CAYENNE PEPPER PO) Take 1 capsule by mouth in the morning and at bedtime.     carvedilol (COREG) 12.5 MG tablet Take 1 tablet (12.5 mg total) by mouth 2 (two) times daily with a meal. 180 tablet 3  clopidogrel (PLAVIX) 75 MG tablet TAKE 1 TABLET BY MOUTH DAILY 90 tablet 3   COLLAGEN PO Take 3 tablets by mouth in the morning and at bedtime.     fluticasone (FLONASE) 50 MCG/ACT nasal spray Place 2 sprays into both nostrils as needed for congestion.     furosemide (LASIX) 40 MG tablet Take 1 tablet (40 mg total) by mouth daily as needed for fluid or edema (Poor more than 3 pound weight gain overnight or more than 5 pound weight gain over baseline). 90 tablet 3   gabapentin (NEURONTIN) 100 MG capsule Take 100 mg by mouth.     insulin glargine (LANTUS SOLOSTAR) 100 UNIT/ML Solostar Pen Inject 50 Units into the skin every evening. 100UNITS/3 ML     JARDIANCE 25 MG TABS tablet Take 25 mg by mouth daily.     losartan (COZAAR) 25 MG tablet Take 12.5 mg by mouth daily.     MAGNESIUM OXIDE PO Take 500 mg by mouth  daily. TAKE 1  TABLET BY MOUTH NIGHTLY FOR LEG CRAMPS     nitroGLYCERIN (NITROSTAT) 0.4 MG SL tablet DISSOLVE 1 TABLET UNDER THE  TONGUE EVERY 5 MINUTES AS NEEDED FOR CHEST PAIN. MAX OF 3 TABLETS IN 15 MINUTES. CALL 911 IF PAIN  PERSISTS. 25 tablet 3   Omega-3 Fatty Acids (FISH OIL BURP-LESS) 1000 MG CAPS Take 1,000 mg by mouth 2 (two) times daily.     pantoprazole (PROTONIX) 40 MG tablet Take 40 mg by mouth daily.     ranolazine (RANEXA) 500 MG 12 hr tablet Take 1 tablet (500 mg total) by mouth 2 (two) times daily. 180 tablet 3   Vitamin D, Cholecalciferol, 25 MCG (1000 UT) CAPS Take 1 tablet by mouth daily.     No current facility-administered medications for this visit.     Assessment & Plan    Hyperlipidemia associated with type 2 diabetes mellitus (HCC) Assessment: Patient with ASCVD not at LDL goal of < 55 Most recent LDL 80 on 01/03/23 Has been compliant with high intensity statin: atorvastatin 80  Reviewed options for lowering LDL cholesterol, including ezetimibe, PCSK-9 inhibitors, bempedoic acid and inclisiran.  Discussed mechanisms of action, dosing, side effects, potential decreases in LDL cholesterol and costs.  Also reviewed potential options for patient assistance.  Plan: Patient agreeable to starting Repatha 140 mg q14d Repeat labs after:  3 months Lipid Liver function Does not qualify for [employer] grant   Phillips Hay, PharmD CPP [facility] [address]  [place], [place] [address] [phone]  01/16/2023, 1:05 PM

## 2023-01-16 NOTE — Patient Instructions (Addendum)
Your Results:             Your most recent labs Goal  Total Cholesterol 153 < 200  Triglycerides 201 < 150  HDL (happy/good cholesterol) 39 > 40  LDL (lousy/bad cholesterol 80 < 55   Medication changes:  We will start the process to get Repatha covered by your insurance.  Once the prior authorization is complete, I will call/send a MyChart message to let you know and confirm pharmacy information.   You will take one injection every 14 days  Lab orders:  We want to repeat labs after 2-3 months.  We will send you a lab order to remind you once we get closer to that time.     Thank you for choosing [facility]   High Triglycerides Eating Plan Triglycerides are a type of fat in the blood. High levels of triglycerides can increase your risk of heart disease and stroke. If your triglyceride levels are high, choosing the right foods can help lower your triglycerides and keep your heart healthy. Work with your health care provider or a dietitian to develop an eating plan that is right for you. What are tips for following this plan? General guidelines  Lose weight, if you are overweight. For most people, losing 5-10 lb (2-5 kg) helps lower triglyceride levels. A weight-loss plan may include: 30 minutes of exercise at least 5 days a week. Reducing the amount of calories, sugar, and fat you eat. Eat a wide variety of fresh fruits, vegetables, and whole grains. These foods are high in fiber. Eat foods that contain healthy fats, such as fatty fish, nuts, seeds, and olive oil. Avoid foods that are high in added sugar, added salt (sodium), and saturated fat. Avoid low-fiber, refined carbohydrates such as white bread, crackers, noodles, and white rice. Avoid foods with trans fats or partially hydrogenated oils, such as fried foods or stick margarine. If you drink alcohol: Limit how much you have to: 0-1 drink a day for women who are not pregnant. 0-2 drinks a day for men. Your health care  provider may recommend that you drink less than these amounts depending on your overall health. Know how much alcohol is in a drink. In the [place] one drink equals one 12 oz bottle of beer (355 mL), one 5 oz glass of wine (148 mL), or one 1 oz glass of hard liquor (44 mL). Reading food labels Check food labels for: The amount of saturated fat. Choose foods with no or very little saturated fat (less than 2 g). The amount of trans fat. Choose foods with no transfat. The amount of cholesterol. Choose foods that are low in cholesterol. The amount of sodium. Choose foods with less than 140 milligrams (mg) per serving. Shopping Buy dairy products labeled as nonfat (skim) or low-fat (1%). Avoid buying processed or prepackaged foods. These are often high in added sugar, sodium, and fat. Cooking Choose healthy fats when cooking, such as olive oil, avocado oil, or canola oil. Cook foods using lower fat methods, such as baking, broiling, boiling, or grilling. Make your own sauces, dressings, and marinades when possible, instead of buying them. Store-bought sauces, dressings, and marinades are often high in sodium and sugar. Meal planning Eat more home-cooked food and less restaurant, buffet, and fast food. Eat fatty fish at least 2 times each week. Examples of fatty fish include salmon, trout, sardines, mackerel, tuna, and herring. If you eat whole eggs, do not eat more than 4 egg yolks per  week. What foods should I eat? Fruits All fresh, canned (in natural juice), or frozen fruits. Vegetables Fresh or frozen vegetables. Low-sodium canned vegetables. Grains Whole wheat or whole grain breads, crackers, cereals, and pasta. Unsweetened oatmeal. Bulgur. Barley. Quinoa. Brown rice. Whole wheat flour tortillas. Meats and other proteins Skinless chicken or [place]. Ground chicken or [place]. Lean cuts of pork, trimmed of fat. Fish and seafood, especially salmon, trout, and herring. Egg whites. Dried beans,  peas, or lentils. Unsalted nuts or seeds. Unsalted canned beans. Natural peanut or almond butter or other nut butters. Dairy Low-fat dairy products. Skim or low-fat (1%) milk. Reduced fat (2%) and low-sodium cheese. Low-fat ricotta cheese. Low-fat cottage cheese. Plain, low-fat yogurt. Fats and oils Tub margarine without trans fats. Light or reduced-fat mayonnaise. Light or reduced-fat salad dressings. Avocado. Safflower, olive, sunflower, soybean, and canola oils. The items listed above may not be a complete list of recommended foods and beverages. Talk with your dietitian about what dietary choices are best for you. What foods should I avoid? Fruits Sweetened dried fruit. Canned fruit in syrup. Fruit juice. Vegetables Creamed or fried vegetables. Vegetables in a cheese sauce. Grains White bread. White (regular) pasta. White rice. Cornbread. Bagels. Pastries. Crackers that contain trans fat. Meats and other proteins Fatty cuts of meat. Ribs. Chicken wings. Tomasa Blase. Sausage. Bologna. Salami. Chitterlings. Fatback. Hot dogs. Bratwurst. Packaged lunch meats. Dairy Whole or reduced-fat (2%) milk. Half-and-half. Cream cheese. Full-fat or sweetened yogurt. Full-fat cheese. Nondairy creamers. Whipped toppings. Processed cheese or cheese spreads. Cheese curds. Fats and oils Butter. Stick margarine. Lard. Shortening. Ghee. Bacon fat. Tropical oils, such as coconut, palm kernel, or palm oils. Beverages Alcohol. Sweetened drinks, such as soda, lemonade, fruit drinks, or punches. Sweets and desserts Corn syrup. Sugars. Honey. Molasses. Candy. Jam and jelly. Syrup. Sweetened cereals. Cookies. Pies. Cakes. Donuts. Muffins. Ice cream. Condiments Store-bought sauces, dressings, and marinades that are high in sugar, such as ketchup and barbecue sauce. The items listed above may not be a complete list of foods and beverages you should avoid. Talk with your dietitian about what dietary choices are best for  you. Summary High levels of triglycerides can increase the risk of heart disease and stroke. Choosing the right foods can help lower your triglycerides. Eat plenty of fresh fruits, vegetables, and whole grains. Choose low-fat dairy and lean meats. Eat fatty fish at least twice a week. Avoid processed and prepackaged foods with added sugar, sodium, saturated fat, and trans fat. If you need suggestions or have questions about what types of food are good for you, talk with your health care provider or a dietitian. This information is not intended to replace advice given to you by your health care provider. Make sure you discuss any questions you have with your health care provider. Document Revised: 08/25/2020 Document Reviewed: 08/25/2020 Elsevier Patient Education  2024 [employer].

## 2023-01-17 ENCOUNTER — Other Ambulatory Visit: Payer: Self-pay | Admitting: Nurse Practitioner

## 2023-02-11 ENCOUNTER — Other Ambulatory Visit: Payer: Self-pay | Admitting: Nurse Practitioner

## 2023-03-14 ENCOUNTER — Ambulatory Visit (INDEPENDENT_AMBULATORY_CARE_PROVIDER_SITE_OTHER): Payer: Medicare Other

## 2023-03-14 DIAGNOSIS — I442 Atrioventricular block, complete: Secondary | ICD-10-CM | POA: Diagnosis not present

## 2023-03-17 LAB — CUP PACEART REMOTE DEVICE CHECK
Battery Remaining Longevity: 16 mo
Battery Voltage: 2.84 V
Brady Statistic AP VP Percent: 7.62 %
Brady Statistic AP VS Percent: 0 %
Brady Statistic AS VP Percent: 92.38 %
Brady Statistic AS VS Percent: 0 %
Brady Statistic RA Percent Paced: 7.58 %
Brady Statistic RV Percent Paced: 100 %
Date Time Interrogation Session: 20241116133926
Implantable Lead Connection Status: 753985
Implantable Lead Connection Status: 753985
Implantable Lead Connection Status: 753985
Implantable Lead Implant Date: 20180315
Implantable Lead Implant Date: 20180315
Implantable Lead Implant Date: 20180315
Implantable Lead Location: 753858
Implantable Lead Location: 753859
Implantable Lead Location: 753860
Implantable Lead Model: 4076
Implantable Lead Model: 4298
Implantable Lead Model: 5076
Implantable Pulse Generator Implant Date: 20180315
Lead Channel Impedance Value: 266 Ohm
Lead Channel Impedance Value: 304 Ohm
Lead Channel Impedance Value: 399 Ohm
Lead Channel Impedance Value: 418 Ohm
Lead Channel Impedance Value: 418 Ohm
Lead Channel Impedance Value: 437 Ohm
Lead Channel Impedance Value: 475 Ohm
Lead Channel Impedance Value: 494 Ohm
Lead Channel Impedance Value: 532 Ohm
Lead Channel Impedance Value: 703 Ohm
Lead Channel Impedance Value: 703 Ohm
Lead Channel Impedance Value: 798 Ohm
Lead Channel Impedance Value: 798 Ohm
Lead Channel Impedance Value: 798 Ohm
Lead Channel Pacing Threshold Amplitude: 1.25 V
Lead Channel Pacing Threshold Amplitude: 1.375 V
Lead Channel Pacing Threshold Amplitude: 2.5 V
Lead Channel Pacing Threshold Pulse Width: 0.4 ms
Lead Channel Pacing Threshold Pulse Width: 0.4 ms
Lead Channel Pacing Threshold Pulse Width: 0.9 ms
Lead Channel Sensing Intrinsic Amplitude: 0.875 mV
Lead Channel Sensing Intrinsic Amplitude: 0.875 mV
Lead Channel Sensing Intrinsic Amplitude: 19.75 mV
Lead Channel Sensing Intrinsic Amplitude: 19.75 mV
Lead Channel Setting Pacing Amplitude: 2 V
Lead Channel Setting Pacing Amplitude: 2.5 V
Lead Channel Setting Pacing Amplitude: 2.5 V
Lead Channel Setting Pacing Pulse Width: 0.4 ms
Lead Channel Setting Pacing Pulse Width: 0.9 ms
Lead Channel Setting Sensing Sensitivity: 4 mV
Zone Setting Status: 755011
Zone Setting Status: 755011

## 2023-03-25 ENCOUNTER — Other Ambulatory Visit: Payer: Self-pay | Admitting: Nurse Practitioner

## 2023-03-26 NOTE — Progress Notes (Signed)
Remote pacemaker transmission.   

## 2023-04-02 ENCOUNTER — Other Ambulatory Visit: Payer: Self-pay | Admitting: Cardiology

## 2023-04-04 ENCOUNTER — Encounter: Payer: Self-pay | Admitting: Pharmacist Clinician (PhC)/ Clinical Pharmacy Specialist

## 2023-04-04 DIAGNOSIS — E1169 Type 2 diabetes mellitus with other specified complication: Secondary | ICD-10-CM

## 2023-04-11 LAB — LIPID PANEL
Chol/HDL Ratio: 2.2 {ratio} (ref 0.0–5.0)
Cholesterol, Total: 87 mg/dL — ABNORMAL LOW (ref 100–199)
HDL: 40 mg/dL (ref >39)
LDL Chol Calc (NIH): 24 mg/dL (ref 0–99)
Triglycerides: 135 mg/dL (ref 0–149)
VLDL Cholesterol Cal: 23 mg/dL (ref 5–40)

## 2023-04-11 LAB — HEPATIC FUNCTION PANEL
ALT: 14 [IU]/L (ref 0–44)
AST: 15 [IU]/L (ref 0–40)
Albumin: 4.5 g/dL (ref 3.9–4.9)
Alkaline Phosphatase: 126 [IU]/L — ABNORMAL HIGH (ref 44–121)
Bilirubin Total: 0.6 mg/dL (ref 0.0–1.2)
Bilirubin, Direct: 0.24 mg/dL (ref 0.00–0.40)
Total Protein: 7 g/dL (ref 6.0–8.5)

## 2023-04-17 ENCOUNTER — Telehealth: Payer: Self-pay

## 2023-04-17 NOTE — Telephone Encounter (Addendum)
Called patient regarding results. Patient had understanding of results.----- Message from Joylene Grapes sent at 04/14/2023  8:54 AM EST ----- Recent labs show cholesterol is at goal, liver enzymes are stable.  Continue current medications and follow-up as planned.  Thank you-EM

## 2023-04-18 ENCOUNTER — Other Ambulatory Visit: Payer: Self-pay | Admitting: Nurse Practitioner

## 2023-04-25 ENCOUNTER — Other Ambulatory Visit: Payer: Self-pay

## 2023-04-25 ENCOUNTER — Emergency Department (HOSPITAL_BASED_OUTPATIENT_CLINIC_OR_DEPARTMENT_OTHER): Payer: Medicare Other

## 2023-04-25 ENCOUNTER — Inpatient Hospital Stay (HOSPITAL_BASED_OUTPATIENT_CLINIC_OR_DEPARTMENT_OTHER)
Admission: EM | Admit: 2023-04-25 | Discharge: 2023-04-28 | DRG: 287 | Disposition: A | Discharge status: Home / Self Care | Payer: Medicare Other | Attending: Cardiology | Admitting: Cardiology

## 2023-04-25 ENCOUNTER — Encounter (HOSPITAL_BASED_OUTPATIENT_CLINIC_OR_DEPARTMENT_OTHER): Payer: Self-pay

## 2023-04-25 ENCOUNTER — Encounter: Payer: Self-pay | Admitting: Cardiology

## 2023-04-25 ENCOUNTER — Telehealth: Payer: Self-pay

## 2023-04-25 ENCOUNTER — Encounter: Payer: Self-pay | Admitting: Internal Medicine

## 2023-04-25 DIAGNOSIS — Z85828 Personal history of other malignant neoplasm of skin: Secondary | ICD-10-CM | POA: Diagnosis not present

## 2023-04-25 DIAGNOSIS — Z7902 Long term (current) use of antithrombotics/antiplatelets: Secondary | ICD-10-CM | POA: Diagnosis not present

## 2023-04-25 DIAGNOSIS — I252 Old myocardial infarction: Secondary | ICD-10-CM | POA: Diagnosis not present

## 2023-04-25 DIAGNOSIS — Z951 Presence of aortocoronary bypass graft: Secondary | ICD-10-CM

## 2023-04-25 DIAGNOSIS — I200 Unstable angina: Secondary | ICD-10-CM | POA: Diagnosis present

## 2023-04-25 DIAGNOSIS — E1169 Type 2 diabetes mellitus with other specified complication: Secondary | ICD-10-CM | POA: Diagnosis present

## 2023-04-25 DIAGNOSIS — R079 Chest pain, unspecified: Secondary | ICD-10-CM | POA: Diagnosis present

## 2023-04-25 DIAGNOSIS — E785 Hyperlipidemia, unspecified: Secondary | ICD-10-CM | POA: Diagnosis present

## 2023-04-25 DIAGNOSIS — Z823 Family history of stroke: Secondary | ICD-10-CM | POA: Diagnosis not present

## 2023-04-25 DIAGNOSIS — K219 Gastro-esophageal reflux disease without esophagitis: Secondary | ICD-10-CM | POA: Diagnosis present

## 2023-04-25 DIAGNOSIS — E669 Obesity, unspecified: Secondary | ICD-10-CM | POA: Diagnosis present

## 2023-04-25 DIAGNOSIS — Z7984 Long term (current) use of oral hypoglycemic drugs: Secondary | ICD-10-CM

## 2023-04-25 DIAGNOSIS — N1832 Chronic kidney disease, stage 3b: Secondary | ICD-10-CM | POA: Diagnosis present

## 2023-04-25 DIAGNOSIS — S40022A Contusion of left upper arm, initial encounter: Secondary | ICD-10-CM | POA: Diagnosis present

## 2023-04-25 DIAGNOSIS — E1142 Type 2 diabetes mellitus with diabetic polyneuropathy: Secondary | ICD-10-CM | POA: Diagnosis present

## 2023-04-25 DIAGNOSIS — E1122 Type 2 diabetes mellitus with diabetic chronic kidney disease: Secondary | ICD-10-CM | POA: Diagnosis present

## 2023-04-25 DIAGNOSIS — Z86718 Personal history of other venous thrombosis and embolism: Secondary | ICD-10-CM | POA: Diagnosis not present

## 2023-04-25 DIAGNOSIS — E118 Type 2 diabetes mellitus with unspecified complications: Secondary | ICD-10-CM

## 2023-04-25 DIAGNOSIS — Z905 Acquired absence of kidney: Secondary | ICD-10-CM | POA: Diagnosis not present

## 2023-04-25 DIAGNOSIS — Z86711 Personal history of pulmonary embolism: Secondary | ICD-10-CM

## 2023-04-25 DIAGNOSIS — I129 Hypertensive chronic kidney disease with stage 1 through stage 4 chronic kidney disease, or unspecified chronic kidney disease: Secondary | ICD-10-CM | POA: Diagnosis present

## 2023-04-25 DIAGNOSIS — I10 Essential (primary) hypertension: Secondary | ICD-10-CM | POA: Diagnosis present

## 2023-04-25 DIAGNOSIS — Z8616 Personal history of COVID-19: Secondary | ICD-10-CM

## 2023-04-25 DIAGNOSIS — Z8249 Family history of ischemic heart disease and other diseases of the circulatory system: Secondary | ICD-10-CM | POA: Diagnosis not present

## 2023-04-25 DIAGNOSIS — I25110 Atherosclerotic heart disease of native coronary artery with unstable angina pectoris: Principal | ICD-10-CM | POA: Diagnosis present

## 2023-04-25 DIAGNOSIS — Z794 Long term (current) use of insulin: Secondary | ICD-10-CM | POA: Diagnosis not present

## 2023-04-25 LAB — CBC WITH DIFFERENTIAL/PLATELET
Abs Immature Granulocytes: 0.01 10*3/uL (ref 0.00–0.07)
Basophils Absolute: 0 10*3/uL (ref 0.0–0.1)
Basophils Relative: 1 %
Eosinophils Absolute: 0.1 10*3/uL (ref 0.0–0.5)
Eosinophils Relative: 2 %
HCT: 47.5 % (ref 39.0–52.0)
Hemoglobin: 15.8 g/dL (ref 13.0–17.0)
Immature Granulocytes: 0 %
Lymphocytes Relative: 27 %
Lymphs Abs: 1.7 10*3/uL (ref 0.7–4.0)
MCH: 30.4 pg (ref 26.0–34.0)
MCHC: 33.3 g/dL (ref 30.0–36.0)
MCV: 91.3 fL (ref 80.0–100.0)
Monocytes Absolute: 0.4 10*3/uL (ref 0.1–1.0)
Monocytes Relative: 6 %
Neutro Abs: 3.9 10*3/uL (ref 1.7–7.7)
Neutrophils Relative %: 64 %
Platelets: 123 10*3/uL — ABNORMAL LOW (ref 150–400)
RBC: 5.2 MIL/uL (ref 4.22–5.81)
RDW: 14.1 % (ref 11.5–15.5)
WBC: 6.1 10*3/uL (ref 4.0–10.5)
nRBC: 0 % (ref 0.0–0.2)

## 2023-04-25 LAB — COMPREHENSIVE METABOLIC PANEL
ALT: 20 U/L (ref 0–44)
AST: 17 U/L (ref 15–41)
Albumin: 4 g/dL (ref 3.5–5.0)
Alkaline Phosphatase: 89 U/L (ref 38–126)
Anion gap: 7 (ref 5–15)
BUN: 28 mg/dL — ABNORMAL HIGH (ref 8–23)
CO2: 27 mmol/L (ref 22–32)
Calcium: 9.1 mg/dL (ref 8.9–10.3)
Chloride: 105 mmol/L (ref 98–111)
Creatinine, Ser: 1.71 mg/dL — ABNORMAL HIGH (ref 0.61–1.24)
GFR, Estimated: 45 mL/min — ABNORMAL LOW (ref >60)
Glucose, Bld: 125 mg/dL — ABNORMAL HIGH (ref 70–99)
Potassium: 4.2 mmol/L (ref 3.5–5.1)
Sodium: 139 mmol/L (ref 135–145)
Total Bilirubin: 0.9 mg/dL (ref <1.2)
Total Protein: 7.1 g/dL (ref 6.5–8.1)

## 2023-04-25 LAB — GLUCOSE, CAPILLARY
Glucose-Capillary: 57 mg/dL — ABNORMAL LOW (ref 70–99)
Glucose-Capillary: 51 mg/dL — ABNORMAL LOW (ref 70–99)
Glucose-Capillary: 194 mg/dL — ABNORMAL HIGH (ref 70–99)

## 2023-04-25 LAB — LIPASE, BLOOD: Lipase: 39 U/L (ref 11–51)

## 2023-04-25 LAB — HEPARIN LEVEL (UNFRACTIONATED): Heparin Unfractionated: 0.36 [IU]/mL (ref 0.30–0.70)

## 2023-04-25 LAB — TROPONIN I (HIGH SENSITIVITY)
Troponin I (High Sensitivity): 7 ng/L (ref <18)
Troponin I (High Sensitivity): 7 ng/L (ref <18)

## 2023-04-25 MED ORDER — CARVEDILOL 12.5 MG PO TABS
12.5000 mg | ORAL_TABLET | Freq: Two times a day (BID) | ORAL | Status: DC
  Administered 2023-04-26 – 2023-04-28 (×5): 12.5 mg via ORAL
  Filled 2023-04-25 (×5): qty 1

## 2023-04-25 MED ORDER — CLOPIDOGREL BISULFATE 75 MG PO TABS
75.0000 mg | ORAL_TABLET | Freq: Every day | ORAL | Status: DC
  Administered 2023-04-26 – 2023-04-27 (×2): 75 mg via ORAL
  Filled 2023-04-25 (×3): qty 1

## 2023-04-25 MED ORDER — NITROGLYCERIN 0.4 MG SL SUBL
0.4000 mg | SUBLINGUAL_TABLET | SUBLINGUAL | Status: DC | PRN
  Administered 2023-04-25 – 2023-04-26 (×5): 0.4 mg via SUBLINGUAL
  Filled 2023-04-25 (×3): qty 1

## 2023-04-25 MED ORDER — ALPRAZOLAM 0.5 MG PO TABS: 0.5000 mg | ORAL_TABLET | Freq: Every evening | ORAL | Status: DC | PRN

## 2023-04-25 MED ORDER — MELATONIN 3 MG PO TABS
3.0000 mg | ORAL_TABLET | Freq: Every day | ORAL | Status: DC
  Administered 2023-04-25 – 2023-04-27 (×3): 3 mg via ORAL
  Filled 2023-04-25 (×3): qty 1

## 2023-04-25 MED ORDER — RANOLAZINE ER 500 MG PO TB12
500.0000 mg | ORAL_TABLET | Freq: Two times a day (BID) | ORAL | Status: DC
  Administered 2023-04-25 – 2023-04-27 (×5): 500 mg via ORAL
  Filled 2023-04-25 (×6): qty 1

## 2023-04-25 MED ORDER — ONDANSETRON HCL 4 MG/2ML IJ SOLN
4.0000 mg | Freq: Once | INTRAMUSCULAR | Status: AC
  Administered 2023-04-25: 4 mg via INTRAVENOUS
  Filled 2023-04-25: qty 2

## 2023-04-25 MED ORDER — EMPAGLIFLOZIN 25 MG PO TABS
25.0000 mg | ORAL_TABLET | Freq: Every day | ORAL | Status: DC
  Administered 2023-04-26 – 2023-04-27 (×2): 25 mg via ORAL
  Filled 2023-04-25 (×3): qty 1

## 2023-04-25 MED ORDER — HEPARIN BOLUS VIA INFUSION
4000.0000 [IU] | Freq: Once | INTRAVENOUS | Status: AC
  Administered 2023-04-25: 4000 [IU] via INTRAVENOUS

## 2023-04-25 MED ORDER — ASPIRIN 81 MG PO CHEW
324.0000 mg | CHEWABLE_TABLET | Freq: Once | ORAL | Status: AC
  Administered 2023-04-25: 324 mg via ORAL
  Filled 2023-04-25: qty 4

## 2023-04-25 MED ORDER — ONDANSETRON HCL 4 MG/2ML IJ SOLN
4.0000 mg | Freq: Four times a day (QID) | INTRAMUSCULAR | Status: DC | PRN
  Administered 2023-04-27: 4 mg via INTRAVENOUS
  Filled 2023-04-25: qty 2

## 2023-04-25 MED ORDER — ATORVASTATIN CALCIUM 80 MG PO TABS
80.0000 mg | ORAL_TABLET | Freq: Every evening | ORAL | Status: DC
  Administered 2023-04-25 – 2023-04-27 (×3): 80 mg via ORAL
  Filled 2023-04-25 (×3): qty 1

## 2023-04-25 MED ORDER — MORPHINE SULFATE (PF) 2 MG/ML IV SOLN
2.0000 mg | Freq: Once | INTRAVENOUS | Status: AC
  Administered 2023-04-25: 2 mg via INTRAVENOUS
  Filled 2023-04-25: qty 1

## 2023-04-25 MED ORDER — LOSARTAN POTASSIUM 25 MG PO TABS
12.5000 mg | ORAL_TABLET | Freq: Every day | ORAL | Status: DC
  Administered 2023-04-26 – 2023-04-27 (×2): 12.5 mg via ORAL
  Filled 2023-04-25 (×3): qty 1

## 2023-04-25 MED ORDER — HEPARIN (PORCINE) 25000 UT/250ML-% IV SOLN
1100.0000 [IU]/h | INTRAVENOUS | Status: DC
  Administered 2023-04-25 – 2023-04-27 (×3): 1100 [IU]/h via INTRAVENOUS
  Filled 2023-04-25 (×4): qty 250

## 2023-04-25 NOTE — Telephone Encounter (Signed)
Called and spoke with patient who confirms he is still having chest pain and is going to take another nitroglycerin right now as his chest pain has not resolved since last evening. He describes it as a heavy feeling over his chest. Advised to go to ER or call 911. Patient states that he refuses to call 911 as he doesn't want treatment at the local hospital in [facility]. He agreed to let his wife drive him to [place] so that he could seek care at [facility] ER. UPDATE: Called patient back and informed him that [name] has a free standing ER in [place] on [address] and if his wife could drive him there then they could safely transport him to the [facility] in [place]. Verified the physical address with him and he voiced agreement that they would drive there now. Also reminded patient that since he has already had open heart surgery it is best to seek emergency care right away instead of sending a mychart message via the computer. He voiced understanding.

## 2023-04-25 NOTE — ED Notes (Signed)
ED TO INPATIENT HANDOFF REPORT  ED Nurse Name and Phone #: Dorina Hoyer [facility] [phone]  S Name/Age/Gender John Dodson 62 y.o. male Room/Bed: MH01/MH01  Code Status   Code Status: Prior  Home/SNF/Other Home Patient oriented to: self, place, time, and situation Is this baseline? Yes   Triage Complete: Triage complete  Chief Complaint Chest pain [R07.9]  Triage Note Chest pain that started last night at 2000. Radiated to left arm and jaw. Pt took 2 nitroglycerin that helped some. Today it feels heavy on his chest.    Allergies Allergies  Allergen Reactions   Integrilin [Eptifibatide] Shortness Of Breath and Other (See Comments)    Reportedly had shortness of breath, confusion.   Latex Rash   Codeine Nausea And Vomiting   Tylenol [Acetaminophen] Other (See Comments)    Tylenol -3 with codiene   Zithromax [Azithromycin] Nausea And Vomiting   Contrast Media [Iodinated Contrast Media] Rash and Other (See Comments)    Reportedly cardiac arrest   Glipizide Rash and Other (See Comments)    Headache    Level of Care/Admitting Diagnosis ED Disposition     ED Disposition  Admit   Condition  --   Comment  Hospital Area: [facility] [[id number]]  Level of Care: Telemetry Cardiac [[id number]]  Interfacility transfer: Yes  May place patient in observation at [facility] or [facility] if equivalent level of care is available:: No  Covid Evaluation: Confirmed COVID Negative  Diagnosis: Chest pain [[id number]]  Admitting Physician: Rollene Rotunda [[id number]]  Attending Physician: Rollene Rotunda [phone]          B Medical/Surgery History Past Medical History:  Diagnosis Date   Blepharitis of both eyes    Chronic   Cataracts, both eyes    CKD (chronic kidney disease) stage 3, GFR 30-59 ml/min (HCC)    s/p R Nephrectomy/adrenalectomy & DM-Nephropathy (baseline creatinine 1.4-1.5)   COVID-19 virus infection 04/25/2019   Diabetic  peripheral neuropathy associated with type 2 diabetes mellitus (HCC)    Essential hypertension    GERD without esophagitis    History of basal cell carcinoma (BCC) excision    History of complete heart block 06/2016   s/p BiV PPM (now followed by Dr. Hardie Shackleton - EP)   History of non-ST elevation myocardial infarction (NSTEMI)    And several occasions of unstable angina   Hx of CABGx4 08/25/2019   (Dr. Morrell Riddle, John Dodson): LIMA-LAD, Seq Rad- OM2-LPDA, RIMA-? OM1 (called Ramus on report, but no ramus seen on cath).   Hyperlipidemia associated with type 2 diabetes mellitus (HCC)    on atorvastatin 40 mg.   Multiple Vessel CAD -- s/p PCI of LAD, OM1, LPL1, LPDA & non-dominant RCA --> now S/P CABG x 4 60%   (No cath report prior to 2016) (stents in proxLAD, prox OM1 x 2, prox LPL1 & 3-4 in L PDA & ~2 in  Known non-dominant RCA;  07/2019: MV CAD - patent stents in p-m LAD, p OM1, p LPL & LPDA as well as RCA -> Ost LCx ~60-70% (DFR ++), long mLAD 60% post-stent (DFR ++) --> referred for CABG   Obesity (BMI 30.0-34.9)    Occlusion of left vertebral artery    Consider repossible acute lesion in August 2020 with presentation of headache.  CTA suggested occluded left vertebral artery throughout the neck.  Faint string-like enhancement intermittently visible suggesting recent occlusion.  Partial reconstruction and posterior fossa.  Left PICA patent.  (Consider possible  left vertebral artery dissection)   OSA treated with BiPAP 02/2019   Diagnosis in late 2020 ([facility])--> delayed onset of treatment with BiPAP due to Covid hospitalization followed by PE. ->  BiPAP setting at 21/17 cm   Pulmonary embolism (HCC) 05/18/2019   (1 month following COVID-19 infection): DVT-PE (bilateral PEs noted on VQ scan)-> started on warfarin.  (On warfarin plus Plavix now with aspirin stopped.)   Type 2 diabetes mellitus with complication, with long-term current use of insulin (HCC)    On Lantus, Jardiance, Metformin  & Onglyza   Past Surgical History:  Procedure Laterality Date   BASAL CELL CARCINOMA EXCISION  01/2015   CAROTID DUPLEX SCAN  12/25/2018   [facility]: Mild plaque in both carotids.  139% bilateral.  Right vertebral flow normal antegrade, Left not seen; normal bilateral subclavian flow..   CHOLECYSTECTOMY     CORONARY ARTERY BYPASS GRAFT N/A 08/25/2019   Procedure: CORONARY ARTERY BYPASS GRAFTING (CABG) TIMES FOUR, ON PUMP, USING LEFT AND RIGHT INTERNAL MAMMARY ARTERY AND LEFT RADIAL ARTERY;  Surgeon: Linden Dolin, MD;  Location: [facility] OR;  Service: Open Heart Surgery;; LIMA-LAD, Seq Rad OM2-LPDA, RIMA-OM1   CORONARY STENT INTERVENTION  04/10/2015   [facility] [place]; Tollie Pizza, DO) -> DES PCI mLPDA: Xience Alpine DES 2.5 mm x 18 mm, Xience Alpine DES 2.25 mm x 12 mm overlapping.   CORONARY STENT INTERVENTION  04/04/2016   [facility] [place]; [place], MD)--> (urgent) 100% mLPDA PTCA with 2.25 mm balloon - reduced to 50%; mid OM2 90% - DES PCI (Xience DES  2.5 x 18).    CORONARY STENT INTERVENTION  2015   Prior to December 2016, STENTS noted in prox LAD, proximal OM1, and at least 2 stents in proximal L PDA   CORONARY STENT INTERVENTION  11/17/2018   [facility] [place]; Tollie Pizza, DO): CULPRIT: mid RCA 90% (DES PCI) -Resolute Onyx DES 2.5 mm x 30 mm --> COMPLICATION: Large left arm hematoma-evaluated by vascular and orthopedic surgery, managed with splint and arm elevation.   CORONARY STENT INTERVENTION  12/28/2018   [facility] [place]; Cathlean Cower, MD, referred by Dr. Ginger Carne) --> INDICATION (urgent, Unstable Angina): Moderate-severe (70%) stenosis of ostial RCA  -> DES PCI Resolute Onyx DES 2.5 mm x 12 mm.    LEFT HEART CATH AND CORONARY ANGIOGRAPHY  04/10/2015   [facility] [place]; Tollie Pizza, DO)--> EF 55%.  Mild inferior HK.  Coronaries-LM: Normal, p LAD STENT 30% ISR, mLAD 20% & diffuse dLAD ~20%; Dom LCx: mCx 20%, ~pOM1 STENT (noted as  OM2 in other reports) patent with mid 30%, OM2 normal, prox LPDA "STENTS" patent with SEVERE mid L PDA 90-95% (DES PCI x 2 overlapping); Small-non-dominant RCA  patent    LEFT HEART CATH AND CORONARY ANGIOGRAPHY  04/04/2016   [facility] [place] [place]; [place], MD)--> (urgent): EF 70%.  Previous LAD,~OM2 and proximal PDA stents patent; -> mLAD 35%, dLAD 40%; LCx-OM1 75% (short lesion, med Rx), mid OM2 90% (DES PCI), pLPDA stents patent w/ mPDA 100% (PTCA only - reduced to 50%); non-dom RCA - ost RCA 60% & mRCA 75%   LEFT HEART CATH AND CORONARY ANGIOGRAPHY  11/17/2018   [facility]; Tollie Pizza, DO) indication: Angina.  LM normal; LAD - ost LAD ~50%, pLAD STENT ~30% ISR with dLAD ~40%; Dom LCx - ost & prox Cx 30%, OM1 STENT 20% ISR, OM2 STENT 50% distal edge, pLPDA overlapping STENTS ~20%; nonDom RCA - proxRCA  40%, mid 90% (DES PCI)   LEFT HEART CATH AND CORONARY ANGIOGRAPHY  12/28/2018   [facility] [place]; Cathlean Cower, MD, referred by Dr. Ginger Carne) --> INDICATION (urgent, Unstable Angina): Moderate-severe (70%) stenosis of ostial RCA (DES PCI), mRCA 30%.  Otherwise no significant change from July 2020: dLM 20%, pLAD ~40% ISR , dLAD long/diffuse ~50%; OstLCx 30%, OM1 ~15% OM2 ~30% with patent  LPDA stents/PTCA site.    LEFT HEART CATH AND CORONARY ANGIOGRAPHY N/A 08/20/2019   Procedure: LEFT HEART CATH AND CORONARY ANGIOGRAPHY;  Surgeon: Marykay Lex, MD;  Location: [facility] INVASIVE CV LAB;  Service: Cardiovascular;; Multivessel CAD: Patent stents in proximal to mid LAD, proximal OM1, proximal LPL 1 and several stents in L PDA as well as RCA (nondominant).  Ostial LCx 60%-highly DFR +0.84.  There is a long mid LAD 60% stenosis after the stent--highly DFR +0.67.--> CABG   NEPHRECTOMY Right 2012   With adrenalectomy   NM MYOVIEW LTD  08/20/2018   [facility] -> Lexiscan Myoview: EF 81%.  No reversible ischemia or infarction.  Normal wall motion.   PACEMAKER IMPLANT   06/2016   [facility] [place] ([employer]) -> according to CT of chest, leads positioned in right atrium, cardiac apex and coronary sinus (suggesting CRT-P - BiV Pacing))   RADIAL ARTERY HARVEST Left 08/25/2019   Procedure: RADIAL ARTERY HARVEST;  Surgeon: Linden Dolin, MD;  Location: [facility] OR;  Service: Open Heart Surgery;  Laterality: Left;   TEE WITHOUT CARDIOVERSION N/A 08/25/2019   Procedure: TRANSESOPHAGEAL ECHOCARDIOGRAM (TEE);  Surgeon: Linden Dolin, MD;  Location: [facility] OR;  Service: Open Heart Surgery;  Laterality: N/A;   TRANSTHORACIC ECHOCARDIOGRAM  11/2018; 05/01/2019   [facility]) a) EF 60-65%.  Mild TR.;; b)moderate concentric LVH.  EF 55 to 60%.  No R WMA.  Normal RV size and function.  Normal atrial sizes.  Normal valves.     A IV Location/Drains/Wounds Patient Lines/Drains/Airways Status     Active Line/Drains/Airways     Name Placement date Placement time Site Days   Peripheral IV 04/25/23 18 G 1.16" Anterior;Distal;Left;Upper Arm 04/25/23  1246  Arm  less than 1   Incision (Closed) 08/25/19 Chest Other (Comment) 08/25/19  0726  -- 1339   Incision (Closed) 08/25/19 Arm Left 08/25/19  0726  -- 1339            Intake/Output Last 24 hours No intake or output data in the 24 hours ending 04/25/23 1539  Labs/Imaging Results for orders placed or performed during the hospital encounter of 04/25/23 (from the past 48 hours)  Comprehensive metabolic panel     Status: Abnormal   Collection Time: 04/25/23 12:50 PM  Result Value Ref Range   Sodium 139 135 - 145 mmol/L   Potassium 4.2 3.5 - 5.1 mmol/L   Chloride 105 98 - 111 mmol/L   CO2 27 22 - 32 mmol/L   Glucose, Bld 125 (H) 70 - 99 mg/dL    Comment: Glucose reference range applies only to samples taken after fasting for at least 8 hours.   BUN 28 (H) 8 - 23 mg/dL   Creatinine, Ser 4.09 (H) 0.61 - 1.24 mg/dL   Calcium 9.1 8.9 - 81.1 mg/dL   Total Protein 7.1 6.5 - 8.1 g/dL   Albumin  4.0 3.5 - 5.0 g/dL   AST 17 15 - 41 U/L   ALT 20 0 - 44 U/L   Alkaline Phosphatase 89 38 -  126 U/L   Total Bilirubin 0.9 <1.2 mg/dL   GFR, Estimated 45 (L) >60 mL/min    Comment: (NOTE) Calculated using the CKD-EPI Creatinine Equation (2021)    Anion gap 7 5 - 15    Comment: Performed at [facility], [address] [place], [place] [address]  Lipase, blood     Status: None   Collection Time: 04/25/23 12:50 PM  Result Value Ref Range   Lipase 39 11 - 51 U/L    Comment: Performed at [facility], [address] [place], [place] [address]  Troponin I (High Sensitivity)     Status: None   Collection Time: 04/25/23 12:50 PM  Result Value Ref Range   Troponin I (High Sensitivity) 7 <18 ng/L    Comment: (NOTE) Elevated high sensitivity troponin I (hsTnI) values and significant  changes across serial measurements may suggest ACS but many other  chronic and acute conditions are known to elevate hsTnI results.  Refer to the "Links" section for chest pain algorithms and additional  guidance. Performed at [facility], [address] [place], [place] [address]   CBC with Differential     Status: Abnormal   Collection Time: 04/25/23 12:50 PM  Result Value Ref Range   WBC 6.1 4.0 - 10.5 K/uL   RBC 5.20 4.22 - 5.81 MIL/uL   Hemoglobin 15.8 13.0 - 17.0 g/dL   HCT 06.3 01.6 - 01.0 %   MCV 91.3 80.0 - 100.0 fL   MCH 30.4 26.0 - 34.0 pg   MCHC 33.3 30.0 - 36.0 g/dL   RDW 93.2 35.5 - 73.2 %   Platelets 123 (L) 150 - 400 K/uL   nRBC 0.0 0.0 - 0.2 %   Neutrophils Relative % 64 %   Neutro Abs 3.9 1.7 - 7.7 K/uL   Lymphocytes Relative 27 %   Lymphs Abs 1.7 0.7 - 4.0 K/uL   Monocytes Relative 6 %   Monocytes Absolute 0.4 0.1 - 1.0 K/uL   Eosinophils Relative 2 %   Eosinophils Absolute 0.1 0.0 - 0.5 K/uL   Basophils Relative 1 %   Basophils Absolute 0.0 0.0 - 0.1 K/uL   Immature Granulocytes 0 %   Abs Immature Granulocytes 0.01 0.00 - 0.07 K/uL     Comment: Performed at [facility], [address], [place], [place] [address]   DG Chest 2 View Result Date: 04/25/2023 CLINICAL DATA:  Chest pain. EXAM: CHEST - 2 VIEW COMPARISON:  11/23/2019. FINDINGS: Bilateral lung fields are clear. Bilateral costophrenic angles are clear. Normal cardio-mediastinal silhouette. There is a left sided 3-lead pacemaker. No acute osseous abnormalities. The soft tissues are within normal limits. IMPRESSION: *No active cardiopulmonary disease. Electronically Signed   By: Jules Schick M.D.   On: 04/25/2023 14:35    Pending Labs Unresulted Labs (From admission, onward)     Start     Ordered   04/25/23 2030  Heparin level (unfractionated)  Once-Timed,   URGENT        04/25/23 1355            Vitals/Pain Today's Vitals   04/25/23 1415 04/25/23 1445 04/25/23 1459 04/25/23 1515  BP: 110/63 131/71  115/63  Pulse: 64 61  (!) 59  Resp: (!) 3 (!) 0  (!) 9  Temp:      TempSrc:      SpO2: 97% 98%  100%  Weight:  Height:      PainSc:   5      Isolation Precautions No active isolations  Medications Medications  nitroGLYCERIN (NITROSTAT) SL tablet 0.4 mg (0.4 mg Sublingual Given 04/25/23 1405)  heparin ADULT infusion 100 units/mL (25000 units/242mL) (1,100 Units/hr Intravenous New Bag/Given 04/25/23 1407)  aspirin chewable tablet 324 mg (324 mg Oral Given 04/25/23 1243)  heparin bolus via infusion 4,000 Units (4,000 Units Intravenous Bolus from Bag 04/25/23 1408)  ondansetron (ZOFRAN) injection 4 mg (4 mg Intravenous Given 04/25/23 1504)  morphine (PF) 2 MG/ML injection 2 mg (2 mg Intravenous Given 04/25/23 1505)    Mobility walks     Focused Assessments Cardiac Assessment Handoff:  Cardiac Rhythm: Ventricular paced No results found for: "CKTOTAL", "CKMB", "CKMBINDEX", "TROPONINI" No results found for: "DDIMER" Does the Patient currently have chest pain? Yes    R Recommendations: See Admitting Provider Note  Report given  to:   Additional Notes:

## 2023-04-25 NOTE — Progress Notes (Signed)
PHARMACY - ANTICOAGULATION CONSULT NOTE  Pharmacy Consult for heparin Indication: chest pain/ACS  Allergies  Allergen Reactions   Integrilin [Eptifibatide] Shortness Of Breath and Other (See Comments)    Reportedly had shortness of breath, confusion.   Latex Rash   Codeine Nausea And Vomiting   Tylenol [Acetaminophen] Other (See Comments)    Tylenol -3 with codiene   Zithromax [Azithromycin] Nausea And Vomiting   Contrast Media [Iodinated Contrast Media] Rash and Other (See Comments)    Reportedly cardiac arrest   Glipizide Rash and Other (See Comments)    Headache    Patient Measurements: Height: 5\' 8"  (172.7 cm) Weight: 91.2 kg (201 lb 1 oz) IBW/kg (Calculated) : 68.4 Heparin Dosing Weight: 87.2kg  Vital Signs: Temp: 98 F (36.7 C) (12/27 2105) Temp Source: Oral (12/27 2105) BP: 124/54 (12/27 2105) Pulse Rate: 66 (12/27 2105)  Labs: Recent Labs    04/25/23 1250 04/25/23 1446 04/25/23 2046  HGB 15.8  --   --   HCT 47.5  --   --   PLT 123*  --   --   HEPARINUNFRC  --   --  0.36  CREATININE 1.71*  --   --   TROPONINIHS 7 7  --     Estimated Creatinine Clearance: 49.7 mL/min (A) (by C-G formula based on SCr of 1.71 mg/dL (H)).   Medical History: Past Medical History:  Diagnosis Date   Blepharitis of both eyes    Chronic   Cataracts, both eyes    CKD (chronic kidney disease) stage 3, GFR 30-59 ml/min (HCC)    s/p R Nephrectomy/adrenalectomy & DM-Nephropathy (baseline creatinine 1.4-1.5)   COVID-19 virus infection 04/25/2019   Diabetic peripheral neuropathy associated with type 2 diabetes mellitus (HCC)    Essential hypertension    GERD without esophagitis    History of basal cell carcinoma (BCC) excision    History of complete heart block 06/2016   s/p BiV PPM (now followed by Dr. Hardie Shackleton - EP)   History of non-ST elevation myocardial infarction (NSTEMI)    And several occasions of unstable angina   Hx of CABGx4 08/25/2019   (Dr. Morrell Riddle, [name]):  LIMA-LAD, Seq Rad- OM2-LPDA, RIMA-? OM1 (called Ramus on report, but no ramus seen on cath).   Hyperlipidemia associated with type 2 diabetes mellitus (HCC)    on atorvastatin 40 mg.   Multiple Vessel CAD -- s/p PCI of LAD, OM1, LPL1, LPDA & non-dominant RCA --> now S/P CABG x 4 60%   (No cath report prior to 2016) (stents in proxLAD, prox OM1 x 2, prox LPL1 & 3-4 in L PDA & ~2 in  Known non-dominant RCA;  07/2019: MV CAD - patent stents in p-m LAD, p OM1, p LPL & LPDA as well as RCA -> Ost LCx ~60-70% (DFR ++), long mLAD 60% post-stent (DFR ++) --> referred for CABG   Obesity (BMI 30.0-34.9)    Occlusion of left vertebral artery    Consider repossible acute lesion in August 2020 with presentation of headache.  CTA suggested occluded left vertebral artery throughout the neck.  Faint string-like enhancement intermittently visible suggesting recent occlusion.  Partial reconstruction and posterior fossa.  Left PICA patent.  (Consider possible left vertebral artery dissection)   OSA treated with BiPAP 02/2019   Diagnosis in late 2020 ([facility])--> delayed onset of treatment with BiPAP due to Covid hospitalization followed by PE. ->  BiPAP setting at 21/17 cm   Pulmonary embolism (HCC) 05/18/2019   (  1 month following COVID-19 infection): DVT-PE (bilateral PEs noted on VQ scan)-> started on warfarin.  (On warfarin plus Plavix now with aspirin stopped.)   Type 2 diabetes mellitus with complication, with long-term current use of insulin (HCC)    On Lantus, Jardiance, Metformin & Onglyza    Medications:  Infusions:   heparin 1,100 Units/hr (04/25/23 1407)    Assessment: 62 yom presented to the ED with CP. To start IV heparin. Baseline Hgb is WNL but platelets are low at 123. He is not on anticoagulation PTA.   PM update: heparin level 0.36 (therapeutic) on heparin 1100 units/hr. No issues with the infusion or bleeding reported.  Goal of Therapy:  Heparin level 0.3-0.7 units/ml Monitor  platelets by anticoagulation protocol: Yes   Plan:  Continue heparin gtt 1100 units/hr Check 6 hr confirmatory heparin level Daily heparin level and CBC Plans for cardiac cath Monday noted  Loralee Pacas, PharmD, BCPS 04/25/2023,9:44 PM  Please check AMION for all [facility] Pharmacy phone numbers After 10:00 PM, call Main Pharmacy [phone]

## 2023-04-25 NOTE — ED Notes (Signed)
Pt advised he has recurrent pain, but it's changed to chest pressure, centralized, rated 5/10 now.

## 2023-04-25 NOTE — Progress Notes (Signed)
PHARMACY - ANTICOAGULATION CONSULT NOTE  Pharmacy Consult for heparin Indication: chest pain/ACS  Allergies  Allergen Reactions   Latex Rash   Codeine Nausea And Vomiting   Contrast Media [Iodinated Contrast Media]     Reportedly cardiac arrest   Integrilin [Eptifibatide]     Reportedly had shortness of breath, confusion.   Tylenol [Acetaminophen]     Tylenol -3 with codiene   Zithromax [Azithromycin] Nausea And Vomiting   Glipizide Rash    Headache    Patient Measurements: Height: 5\' 8"  (172.7 cm) Weight: 91.2 kg (201 lb 1 oz) IBW/kg (Calculated) : 68.4 Heparin Dosing Weight: 87.2kg  Vital Signs: Temp: 97.8 F (36.6 C) (12/27 1213) Temp Source: Oral (12/27 1213) BP: 134/68 (12/27 1245) Pulse Rate: 60 (12/27 1245)  Labs: Recent Labs    04/25/23 1250  HGB 15.8  HCT 47.5  PLT 123*  CREATININE 1.71*  TROPONINIHS 7    Estimated Creatinine Clearance: 49.7 mL/min (A) (by C-G formula based on SCr of 1.71 mg/dL (H)).   Medical History: Past Medical History:  Diagnosis Date   Blepharitis of both eyes    Chronic   Cataracts, both eyes    CKD (chronic kidney disease) stage 3, GFR 30-59 ml/min (HCC)    s/p R Nephrectomy/adrenalectomy & DM-Nephropathy (baseline creatinine 1.4-1.5)   COVID-19 virus infection 04/25/2019   Diabetic peripheral neuropathy associated with type 2 diabetes mellitus (HCC)    Essential hypertension    GERD without esophagitis    History of basal cell carcinoma (BCC) excision    History of complete heart block 06/2016   s/p BiV PPM (now followed by Dr. Hardie Shackleton - EP)   History of non-ST elevation myocardial infarction (NSTEMI)    And several occasions of unstable angina   Hx of CABGx4 08/25/2019   (Dr. Morrell Riddle, [name]): LIMA-LAD, Seq Rad- OM2-LPDA, RIMA-? OM1 (called Ramus on report, but no ramus seen on cath).   Hyperlipidemia associated with type 2 diabetes mellitus (HCC)    on atorvastatin 40 mg.   Multiple Vessel CAD -- s/p PCI of  LAD, OM1, LPL1, LPDA & non-dominant RCA --> now S/P CABG x 4 60%   (No cath report prior to 2016) (stents in proxLAD, prox OM1 x 2, prox LPL1 & 3-4 in L PDA & ~2 in  Known non-dominant RCA;  07/2019: MV CAD - patent stents in p-m LAD, p OM1, p LPL & LPDA as well as RCA -> Ost LCx ~60-70% (DFR ++), long mLAD 60% post-stent (DFR ++) --> referred for CABG   Obesity (BMI 30.0-34.9)    Occlusion of left vertebral artery    Consider repossible acute lesion in August 2020 with presentation of headache.  CTA suggested occluded left vertebral artery throughout the neck.  Faint string-like enhancement intermittently visible suggesting recent occlusion.  Partial reconstruction and posterior fossa.  Left PICA patent.  (Consider possible left vertebral artery dissection)   OSA treated with BiPAP 02/2019   Diagnosis in late 2020 ([facility])--> delayed onset of treatment with BiPAP due to Covid hospitalization followed by PE. ->  BiPAP setting at 21/17 cm   Pulmonary embolism (HCC) 05/18/2019   (1 month following COVID-19 infection): DVT-PE (bilateral PEs noted on VQ scan)-> started on warfarin.  (On warfarin plus Plavix now with aspirin stopped.)   Type 2 diabetes mellitus with complication, with long-term current use of insulin (HCC)    On Lantus, Jardiance, Metformin & Onglyza    Medications:  Infusions:   heparin  Assessment: 79 yom presented to the ED with CP. To start IV heparin. Baseline Hgb is WNL but platelets are low at 123. He is not on anticoagulation PTA.   Goal of Therapy:  Heparin level 0.3-0.7 units/ml Monitor platelets by anticoagulation protocol: Yes   Plan:  Heparin bolus 4000 units IV x 1 Heparin gtt 1100 units/hr Check a 6 hr heparin level Daily heparin level and CBC  Samil Mecham, Drake Leach 04/25/2023,1:56 PM

## 2023-04-25 NOTE — ED Provider Notes (Signed)
Emergency Department Provider Note   I have reviewed the triage vital signs and the nursing notes.   HISTORY  Chief Complaint Chest Pain   HPI John Dodson is a 62 y.o. male past history of diabetes, hypertension, CAD status post CABG presents to the emergency department with intermittent left chest pain radiating to his left arm and jaw.  Symptoms began last night and have intermittently responded to nitroglycerin.  He continued to have symptoms this morning and took an additional nitroglycerin on his way to the emergency department after speaking with his cardiology team by phone.  Denies diaphoresis or nausea/vomiting.  No abdominal pain.  Pain is tight feeling.  Past Medical History:  Diagnosis Date   Blepharitis of both eyes    Chronic   Cataracts, both eyes    CKD (chronic kidney disease) stage 3, GFR 30-59 ml/min (HCC)    s/p R Nephrectomy/adrenalectomy & DM-Nephropathy (baseline creatinine 1.4-1.5)   COVID-19 virus infection 04/25/2019   Diabetic peripheral neuropathy associated with type 2 diabetes mellitus (HCC)    Essential hypertension    GERD without esophagitis    History of basal cell carcinoma (BCC) excision    History of complete heart block 06/2016   s/p BiV PPM (now followed by Dr. Hardie Shackleton - EP)   History of non-ST elevation myocardial infarction (NSTEMI)    And several occasions of unstable angina   Hx of CABGx4 08/25/2019   (Dr. Morrell Riddle, [name]): LIMA-LAD, Seq Rad- OM2-LPDA, RIMA-? OM1 (called Ramus on report, but no ramus seen on cath).   Hyperlipidemia associated with type 2 diabetes mellitus (HCC)    on atorvastatin 40 mg.   Multiple Vessel CAD -- s/p PCI of LAD, OM1, LPL1, LPDA & non-dominant RCA --> now S/P CABG x 4 60%   (No cath report prior to 2016) (stents in proxLAD, prox OM1 x 2, prox LPL1 & 3-4 in L PDA & ~2 in  Known non-dominant RCA;  07/2019: MV CAD - patent stents in p-m LAD, p OM1, p LPL & LPDA as well as RCA -> Ost LCx ~60-70%  (DFR ++), Tylea Hise mLAD 60% post-stent (DFR ++) --> referred for CABG   Obesity (BMI 30.0-34.9)    Occlusion of left vertebral artery    Consider repossible acute lesion in August 2020 with presentation of headache.  CTA suggested occluded left vertebral artery throughout the neck.  Faint string-like enhancement intermittently visible suggesting recent occlusion.  Partial reconstruction and posterior fossa.  Left PICA patent.  (Consider possible left vertebral artery dissection)   OSA treated with BiPAP 02/2019   Diagnosis in late 2020 ([facility] [place] [facility])--> delayed onset of treatment with BiPAP due to Covid hospitalization followed by PE. ->  BiPAP setting at 21/17 cm   Pulmonary embolism (HCC) 05/18/2019   (1 month following COVID-19 infection): DVT-PE (bilateral PEs noted on VQ scan)-> started on warfarin.  (On warfarin plus Plavix now with aspirin stopped.)   Type 2 diabetes mellitus with complication, with [name]-term current use of insulin (HCC)    On Lantus, Jardiance, Metformin & Onglyza    Review of Systems  Constitutional: No fever/chills Cardiovascular: Positive chest pain. Respiratory: Denies shortness of breath. Gastrointestinal: No abdominal pain.  No nausea, no vomiting.   Musculoskeletal: Negative for back pain. Skin: Negative for rash. Neurological: Negative for headaches.  ____________________________________________   PHYSICAL EXAM:  VITAL SIGNS: ED Triage Vitals  Encounter Vitals Group     BP 04/25/23 1213 135/84  Pulse Rate 04/25/23 1213 64     Resp 04/25/23 1213 18     Temp 04/25/23 1213 97.8 F (36.6 C)     Temp Source 04/25/23 1213 Oral     SpO2 04/25/23 1213 100 %   Constitutional: Alert and oriented. Well appearing and in no acute distress. Eyes: Conjunctivae are normal. Head: Atraumatic. Nose: No congestion/rhinnorhea. Mouth/Throat: Mucous membranes are moist.  Neck: No stridor.   Cardiovascular: Normal rate, regular rhythm. Good peripheral  circulation. Grossly normal heart sounds.   Respiratory: Normal respiratory effort.  No retractions. Lungs CTAB. Gastrointestinal: Soft and nontender. No distention.  Musculoskeletal: No lower extremity tenderness nor edema. No gross deformities of extremities. Neurologic:  Normal speech and language. No gross focal neurologic deficits are appreciated.   ____________________________________________   LABS (all labs ordered are listed, but only abnormal results are displayed)  Labs Reviewed  COMPREHENSIVE METABOLIC PANEL - Abnormal; Notable for the following components:      Result Value   Glucose, Bld 125 (*)    BUN 28 (*)    Creatinine, Ser 1.71 (*)    GFR, Estimated 45 (*)    All other components within normal limits  CBC WITH DIFFERENTIAL/PLATELET - Abnormal; Notable for the following components:   Platelets 123 (*)    All other components within normal limits  LIPASE, BLOOD  HEPARIN LEVEL (UNFRACTIONATED)  TROPONIN I (HIGH SENSITIVITY)  TROPONIN I (HIGH SENSITIVITY)   ____________________________________________  EKG   EKG Interpretation Date/Time:  Friday April 25 2023 12:13:01 EST Ventricular Rate:  64 PR Interval:  155 QRS Duration:  164 QT Interval:  480 QTC Calculation: 496 R Axis:   251  Text Interpretation: AV PACED RHYTHM Inferior infarct, acute (RCA) Lateral leads are also involved Probable RV involvement, suggest recording right precordial leads >>> Acute MI <<< Confirmed by Alona Bene [phone]) on 04/25/2023 12:24:43 PM        ____________________________________________  RADIOLOGY  DG Chest 2 View Result Date: 04/25/2023 CLINICAL DATA:  Chest pain. EXAM: CHEST - 2 VIEW COMPARISON:  11/23/2019. FINDINGS: Bilateral lung fields are clear. Bilateral costophrenic angles are clear. Normal cardio-mediastinal silhouette. There is a left sided 3-lead pacemaker. No acute osseous abnormalities. The soft tissues are within normal limits. IMPRESSION: *No  active cardiopulmonary disease. Electronically Signed   By: Jules Schick M.D.   On: 04/25/2023 14:35    ____________________________________________   PROCEDURES  Procedure(s) performed:   Procedures  CRITICAL CARE Performed by: Maia Plan Total critical care time: 35 minutes Critical care time was exclusive of separately billable procedures and treating other patients. Critical care was necessary to treat or prevent imminent or life-threatening deterioration. Critical care was time spent personally by me on the following activities: development of treatment plan with patient and/or surrogate as well as nursing, discussions with consultants, evaluation of patient's response to treatment, examination of patient, obtaining history from patient or surrogate, ordering and performing treatments and interventions, ordering and review of laboratory studies, ordering and review of radiographic studies, pulse oximetry and re-evaluation of patient's condition.  Alona Bene, MD Emergency Medicine  ____________________________________________   INITIAL IMPRESSION / ASSESSMENT AND PLAN / ED COURSE  Pertinent labs & imaging results that were available during my care of the patient were reviewed by me and considered in my medical decision making (see chart for details).   This patient is Presenting for Evaluation of CP, which does require a range of treatment options, and is a complaint that involves a high  risk of morbidity and mortality.  The Differential Diagnoses includes but is not exclusive to acute coronary syndrome, aortic dissection, pulmonary embolism, cardiac tamponade, community-acquired pneumonia, pericarditis, musculoskeletal chest wall pain, etc.   Critical Interventions-    Medications  nitroGLYCERIN (NITROSTAT) SL tablet 0.4 mg (0.4 mg Sublingual Given 04/25/23 1405)  heparin ADULT infusion 100 units/mL (25000 units/252mL) (1,100 Units/hr Intravenous New Bag/Given  04/25/23 1407)  aspirin chewable tablet 324 mg (324 mg Oral Given 04/25/23 1243)  heparin bolus via infusion 4,000 Units (4,000 Units Intravenous Bolus from Bag 04/25/23 1408)    Reassessment after intervention: pain resolved.    I did obtain Additional Historical Information from wife at bedside.   Clinical Laboratory Tests Ordered, included CBC without leukocytosis.   Radiologic Tests Ordered, included CXR. I independently interpreted the images and agree with radiology interpretation.   Cardiac Monitor Tracing which shows paced rhythm.    Social Determinants of Health Risk patient is a non-smoker.   Consult complete with Cardiology. Dr. Antoine Poche. Discussed case. They will accept to the Cardiology service. Patient is pain free at this time. Will start heparin and I have placed a temporary bed request for admit to [facility].   Medical Decision Making: Summary:  Presents emergency department with intermittent chest pain responding to nitroglycerin.  Will give additional nitro here.  I do not appreciate any STEMI criteria on initial EKG. Patient is high risk and history is concerning. Will require Cardiology consultation after initial troponin.   Reevaluation with update and discussion with patient. No pain after additional nitroglycerine. Discussed plan for admit and he is in agreement.   Patient's presentation is most consistent with acute presentation with potential threat to life or bodily function.   Disposition: admit  ____________________________________________  FINAL CLINICAL IMPRESSION(S) / ED DIAGNOSES  Final diagnoses:  Unstable angina (HCC)     Note:  This document was prepared using Dragon voice recognition software and may include unintentional dictation errors.  Alona Bene, MD, [facility] Emergency Medicine    Lashaye Fisk, Arlyss Repress, MD 04/25/23 [phone]

## 2023-04-25 NOTE — ED Triage Notes (Signed)
Chest pain that started last night at 2000. Radiated to left arm and jaw. Pt took 2 nitroglycerin that helped some. Today it feels heavy on his chest.

## 2023-04-25 NOTE — ED Notes (Signed)
Pt advised his pain came back in the center of his chest. Pain started at 2000hrs yesterday evening, took a nitroglycerin SL which improved his pain, and then a second 10 minutes after with more improvement. Went to sleep with residual persistent pain, woke up and came to the hospital. Pain continued to get worse around 1115hrs, took a 3rd nitroglycerin with improvement.

## 2023-04-25 NOTE — H&P (Addendum)
Cardiology Admission History and Physical   Patient ID: John Dodson MRN: [id number]; DOB: [birth year]   Admission date: 04/25/2023  PCP:  Andreas Blower., MD   [facility] Providers Cardiologist:  Bryan Lemma, MD  Electrophysiologist:  Lewayne Bunting, MD       Chief Complaint:  Chest pain  Patient Profile:   John Dodson is a 62 y.o. male with CAD s/p CABG x 4 LIMA-distal LAD, sequential left radial-OM2-PDA, RIMA-ramus by Dr. Levada Schilling 08/25/2019, CHB s/p BiV PPM 06/2016, HTN, HLD, DM II PE in the setting of COVID and CKD stage III who is being seen 04/25/2023 for the evaluation of chest pain.  History of Present Illness:   John Dodson is a 62 year old male with past medical history of CAD s/p CABG x 4 LIMA-distal LAD, sequential left radial-OM2-PDA, RIMA-ramus by Dr. Levada Schilling 08/25/2019, CHB s/p BiV PPM 06/2016, HTN, HLD, DM II PE in the setting of COVID and CKD stage III.  Echocardiogram at the time of bypass surgery showed EF 55 to 60%, normal LV function.  Cardiac catheterization prior to bypass surgery on 08/20/2019 showed EF 55 to 65%, 60% ostial to proximal left circumflex lesion, 60% mid to distal LAD lesion, widely patent OM2 stent, 80% lateral second marginal lesion, widely patent stent in the first LPL, 50% RPAV lesion.  Myoview in June 2023 in the setting of chest pain was low risk, EF 62%.  He previously had some musculoskeletal chest pain associated with the incision/scar.  He was started on Ranexa with improvement.  Patient was in his usual state of health until around 8 PM last night when he started having substernal chest discomfort radiating down the left arm and also left jaw.  Syndrome occurred at rest and went away after the second nitroglycerin.  He also has a separate right substernal chest discomfort that is worse with palpation and deep inspiration.  Chest discomfort recurred this morning after he got up to ambulate.  This prompted him to seek urgent  medical attention at [facility].  Initial blood work showed creatinine of 1.71.  Serial troponin 7--> 7.  White blood cell count normal.  Platelet 123.  Chest x-ray showed no acute finding.  EKG showed paced rhythm.  His chest pain is reminiscent of the previous angina.  He was transferred to [facility] to be evaluated for unstable angina.   Past Medical History:  Diagnosis Date   Blepharitis of both eyes    Chronic   Cataracts, both eyes    CKD (chronic kidney disease) stage 3, GFR 30-59 ml/min (HCC)    s/p R Nephrectomy/adrenalectomy & DM-Nephropathy (baseline creatinine 1.4-1.5)   COVID-19 virus infection 04/25/2019   Diabetic peripheral neuropathy associated with type 2 diabetes mellitus (HCC)    Essential hypertension    GERD without esophagitis    History of basal cell carcinoma (BCC) excision    History of complete heart block 06/2016   s/p BiV PPM (now followed by Dr. Hardie Shackleton - EP)   History of non-ST elevation myocardial infarction (NSTEMI)    And several occasions of unstable angina   Hx of CABGx4 08/25/2019   (Dr. Morrell Riddle, [name]): LIMA-LAD, Seq Rad- OM2-LPDA, RIMA-? OM1 (called Ramus on report, but no ramus seen on cath).   Hyperlipidemia associated with type 2 diabetes mellitus (HCC)    on atorvastatin 40 mg.   Multiple Vessel CAD -- s/p PCI of LAD, OM1, LPL1, LPDA & non-dominant RCA --> now S/P  CABG x 4 60%   (No cath report prior to 2016) (stents in proxLAD, prox OM1 x 2, prox LPL1 & 3-4 in L PDA & ~2 in  Known non-dominant RCA;  07/2019: MV CAD - patent stents in p-m LAD, p OM1, p LPL & LPDA as well as RCA -> Ost LCx ~60-70% (DFR ++), long mLAD 60% post-stent (DFR ++) --> referred for CABG   Obesity (BMI 30.0-34.9)    Occlusion of left vertebral artery    Consider repossible acute lesion in August 2020 with presentation of headache.  CTA suggested occluded left vertebral artery throughout the neck.  Faint string-like enhancement intermittently visible  suggesting recent occlusion.  Partial reconstruction and posterior fossa.  Left PICA patent.  (Consider possible left vertebral artery dissection)   OSA treated with BiPAP 02/2019   Diagnosis in late 2020 ([facility])--> delayed onset of treatment with BiPAP due to Covid hospitalization followed by PE. ->  BiPAP setting at 21/17 cm   Pulmonary embolism (HCC) 05/18/2019   (1 month following COVID-19 infection): DVT-PE (bilateral PEs noted on VQ scan)-> started on warfarin.  (On warfarin plus Plavix now with aspirin stopped.)   Type 2 diabetes mellitus with complication, with long-term current use of insulin (HCC)    On Lantus, Jardiance, Metformin & Onglyza    Past Surgical History:  Procedure Laterality Date   BASAL CELL CARCINOMA EXCISION  01/2015   CAROTID DUPLEX SCAN  12/25/2018   [facility]: Mild plaque in both carotids.  139% bilateral.  Right vertebral flow normal antegrade, Left not seen; normal bilateral subclavian flow..   CHOLECYSTECTOMY     CORONARY ARTERY BYPASS GRAFT N/A 08/25/2019   Procedure: CORONARY ARTERY BYPASS GRAFTING (CABG) TIMES FOUR, ON PUMP, USING LEFT AND RIGHT INTERNAL MAMMARY ARTERY AND LEFT RADIAL ARTERY;  Surgeon: Linden Dolin, MD;  Location: [facility] OR;  Service: Open Heart Surgery;; LIMA-LAD, Seq Rad OM2-LPDA, RIMA-OM1   CORONARY STENT INTERVENTION  04/10/2015   [facility] [place]; Tollie Pizza, DO) -> DES PCI mLPDA: Xience Alpine DES 2.5 mm x 18 mm, Xience Alpine DES 2.25 mm x 12 mm overlapping.   CORONARY STENT INTERVENTION  04/04/2016   [facility] [place]; [place], MD)--> (urgent) 100% mLPDA PTCA with 2.25 mm balloon - reduced to 50%; mid OM2 90% - DES PCI (Xience DES  2.5 x 18).    CORONARY STENT INTERVENTION  2015   Prior to December 2016, STENTS noted in prox LAD, proximal OM1, and at least 2 stents in proximal L PDA   CORONARY STENT INTERVENTION  11/17/2018   [facility] [place]; Tollie Pizza, DO): CULPRIT: mid RCA 90%  (DES PCI) -Resolute Onyx DES 2.5 mm x 30 mm --> COMPLICATION: Large left arm hematoma-evaluated by vascular and orthopedic surgery, managed with splint and arm elevation.   CORONARY STENT INTERVENTION  12/28/2018   [facility] [place]; Cathlean Cower, MD, referred by Dr. Ginger Carne) --> INDICATION (urgent, Unstable Angina): Moderate-severe (70%) stenosis of ostial RCA  -> DES PCI Resolute Onyx DES 2.5 mm x 12 mm.    LEFT HEART CATH AND CORONARY ANGIOGRAPHY  04/10/2015   [facility] [place]; Tollie Pizza, DO)--> EF 55%.  Mild inferior HK.  Coronaries-LM: Normal, p LAD STENT 30% ISR, mLAD 20% & diffuse dLAD ~20%; Dom LCx: mCx 20%, ~pOM1 STENT (noted as OM2 in other reports) patent with mid 30%, OM2 normal, prox LPDA "STENTS" patent with SEVERE mid L PDA 90-95% (DES PCI x 2 overlapping); Small-non-dominant RCA  patent    LEFT HEART CATH AND CORONARY ANGIOGRAPHY  04/04/2016   [facility] [place]; [place], MD)--> (urgent): EF 70%.  Previous LAD,~OM2 and proximal PDA stents patent; -> mLAD 35%, dLAD 40%; LCx-OM1 75% (short lesion, med Rx), mid OM2 90% (DES PCI), pLPDA stents patent w/ mPDA 100% (PTCA only - reduced to 50%); non-dom RCA - ost RCA 60% & mRCA 75%   LEFT HEART CATH AND CORONARY ANGIOGRAPHY  11/17/2018   [facility] [place]; Tollie Pizza, DO) indication: Angina.  LM normal; LAD - ost LAD ~50%, pLAD STENT ~30% ISR with dLAD ~40%; Dom LCx - ost & prox Cx 30%, OM1 STENT 20% ISR, OM2 STENT 50% distal edge, pLPDA overlapping STENTS ~20%; nonDom RCA - proxRCA 40%, mid 90% (DES PCI)   LEFT HEART CATH AND CORONARY ANGIOGRAPHY  12/28/2018   [facility] [place]; Cathlean Cower, MD, referred by Dr. Fayrene Fearing [place]) --> INDICATION (urgent, Unstable Angina): Moderate-severe (70%) stenosis of ostial RCA (DES PCI), mRCA 30%.  Otherwise no significant change from July 2020: dLM 20%, pLAD ~40% ISR , dLAD long/diffuse ~50%; OstLCx 30%, OM1 ~15% OM2 ~30% with patent  LPDA stents/PTCA site.     LEFT HEART CATH AND CORONARY ANGIOGRAPHY N/A 08/20/2019   Procedure: LEFT HEART CATH AND CORONARY ANGIOGRAPHY;  Surgeon: Marykay Lex, MD;  Location: [facility] INVASIVE CV LAB;  Service: Cardiovascular;; Multivessel CAD: Patent stents in proximal to mid LAD, proximal OM1, proximal LPL 1 and several stents in L PDA as well as RCA (nondominant).  Ostial LCx 60%-highly DFR +0.84.  There is a long mid LAD 60% stenosis after the stent--highly DFR +0.67.--> CABG   NEPHRECTOMY Right 2012   With adrenalectomy   NM MYOVIEW LTD  08/20/2018   [facility] -> Lexiscan Myoview: EF 81%.  No reversible ischemia or infarction.  Normal wall motion.   PACEMAKER IMPLANT  06/2016   [facility] [place] ([employer]) -> according to CT of chest, leads positioned in right atrium, cardiac apex and coronary sinus (suggesting CRT-P - BiV Pacing))   RADIAL ARTERY HARVEST Left 08/25/2019   Procedure: RADIAL ARTERY HARVEST;  Surgeon: Linden Dolin, MD;  Location: [facility] OR;  Service: Open Heart Surgery;  Laterality: Left;   TEE WITHOUT CARDIOVERSION N/A 08/25/2019   Procedure: TRANSESOPHAGEAL ECHOCARDIOGRAM (TEE);  Surgeon: Linden Dolin, MD;  Location: [facility] OR;  Service: Open Heart Surgery;  Laterality: N/A;   TRANSTHORACIC ECHOCARDIOGRAM  11/2018; 05/01/2019   [facility]) a) EF 60-65%.  Mild TR.;; b)moderate concentric LVH.  EF 55 to 60%.  No R WMA.  Normal RV size and function.  Normal atrial sizes.  Normal valves.     Medications Prior to Admission: Prior to Admission medications   Medication Sig Start Date End Date Taking? Authorizing Provider  albuterol (VENTOLIN HFA) 108 (90 Base) MCG/ACT inhaler Inhale 2 puffs into the lungs every 4 (four) hours as needed for wheezing or shortness of breath.   Yes [provider]  allopurinol (ZYLOPRIM) 300 MG tablet Take 300 mg by mouth daily. 03/17/20  Yes [provider]  atorvastatin (LIPITOR) 80 MG tablet TAKE 1 TABLET BY MOUTH  DAILY Patient taking differently: Take 80 mg by mouth every evening. 04/21/23  Yes Monge, Petra Kuba, NP  Capsicum, Cayenne, (CAYENNE PEPPER PO) Take 1 capsule by mouth in the morning and at bedtime.   Yes [provider]  carvedilol (COREG) 12.5 MG tablet Take 1 tablet (12.5 mg total) by mouth 2 (  two) times daily with a meal. 12/17/19  Yes Marinus Maw, MD  clopidogrel (PLAVIX) 75 MG tablet TAKE 1 TABLET BY MOUTH DAILY 10/15/22  Yes Marinus Maw, MD  COLLAGEN PO Take 3 tablets by mouth in the morning and at bedtime.   Yes [provider]  Evolocumab (REPATHA SURECLICK) 140 MG/ML SOAJ Inject 140 mg into the skin every 14 (fourteen) days. 01/16/23  Yes Marykay Lex, MD  fluticasone [facility]) 50 MCG/ACT nasal spray Place 2 sprays into both nostrils as needed for congestion. 11/03/20  Yes [provider]  furosemide (LASIX) 40 MG tablet Take 1 tablet (40 mg total) by mouth daily as needed for fluid or edema (Poor more than 3 pound weight gain overnight or more than 5 pound weight gain over baseline). 08/21/22  Yes Marinus Maw, MD  gabapentin (NEURONTIN) 100 MG capsule Take 200 mg by mouth at bedtime.   Yes [provider]  insulin glargine (LANTUS SOLOSTAR) 100 UNIT/ML Solostar Pen Inject 50 Units into the skin every evening. 100UNITS/3 ML   Yes [provider]  JARDIANCE 25 MG TABS tablet Take 25 mg by mouth daily. 05/11/20  Yes [provider]  losartan (COZAAR) 25 MG tablet Take 12.5 mg by mouth daily. 01/15/21  Yes [provider]  MAGNESIUM OXIDE PO Take 500 mg by mouth at bedtime. FOR LEG CRAMPS   Yes [provider]  nitroGLYCERIN (NITROSTAT) 0.4 MG SL tablet DISSOLVE 1 TABLET UNDER THE  TONGUE EVERY 5 MINUTES AS NEEDED FOR CHEST PAIN. MAX OF 3 TABLETS IN 15 MINUTES. CALL 911 IF PAIN  PERSISTS. 04/07/23  Yes Marykay Lex, MD  Omega-3 Fatty Acids (FISH OIL BURP-LESS) 1000 MG CAPS Take 2,000 mg by mouth daily.   Yes  [provider]  pantoprazole (PROTONIX) 40 MG tablet Take 40 mg by mouth 2 (two) times daily. 12/09/13  Yes [provider]  ranolazine (RANEXA) 500 MG 12 hr tablet TAKE 1 TABLET BY MOUTH TWICE  DAILY 03/25/23  Yes Monge, Petra Kuba, NP  Vitamin D, Cholecalciferol, 25 MCG (1000 UT) CAPS Take 1,000 Units by mouth at bedtime.   Yes [provider]     Allergies:    Allergies  Allergen Reactions   Integrilin [Eptifibatide] Shortness Of Breath and Other (See Comments)    Reportedly had shortness of breath, confusion.   Latex Rash   Codeine Nausea And Vomiting   Tylenol [Acetaminophen] Other (See Comments)    Tylenol -3 with codiene   Zithromax [Azithromycin] Nausea And Vomiting   Contrast Media [Iodinated Contrast Media] Rash and Other (See Comments)    Reportedly cardiac arrest   Glipizide Rash and Other (See Comments)    Headache    Social History:   Social History   Socioeconomic History   Marital status: Married    Spouse name: Not on file   Number of children: Not on file   Years of education: Not on file   Highest education level: Not on file  Occupational History   Not on file  Tobacco Use   Smoking status: Never   Smokeless tobacco: Never  Vaping Use   Vaping status: Never Used  Substance and Sexual Activity   Alcohol use: No   Drug use: No   Sexual activity: Not on file  Other Topics Concern   Not on file  Social History Narrative   Not on file   Social Drivers of Health   Financial Resource Strain: Not  on file  Food Insecurity: No Food Insecurity (04/25/2023)   Hunger Vital Sign    Worried About Running Out of Food in the Last Year: Never true    Ran Out of Food in the Last Year: Never true  Transportation Needs: No Transportation Needs (04/25/2023)   PRAPARE - Administrator, Civil Service (Medical): No    Lack of Transportation (Non-Medical): No  Physical Activity: Unknown (04/24/2021)   [facility] visits prior to 06/29/2022., [facility] visits prior to 06/29/2022.   Exercise Vital Sign    Days of Exercise per Week: 0 days    Minutes of Exercise per Session: Not on file  Stress: No Stress Concern Present (04/24/2021)   [facility] visits prior to 06/29/2022., [facility] visits prior to 06/29/2022.   [employer] of Occupational Health - Occupational Stress Questionnaire    Feeling of Stress : Not at all  Social Connections: Moderately Integrated (04/24/2021)   [facility] from [facility] visits prior to 06/29/2022., [facility] visits prior to 06/29/2022.   Social Connection and Isolation Panel [NHANES]    Frequency of Communication with Friends and Family: Once a week    Frequency of Social Gatherings with Friends and Family: Three times a week    Attends Religious Services: More than 4 times per year    Active Member of Clubs or Organizations: No    Attends Banker Meetings: Never    Marital Status: Married  Catering manager Violence: Patient Unable To Answer (04/25/2023)   Humiliation, Afraid, Rape, and Kick questionnaire    Fear of Current or Ex-Partner: Patient unable to answer    Emotionally Abused: Patient unable to answer    Physically Abused: Patient unable to answer    Sexually Abused: Patient unable to answer    Family History:   The patient's family history includes Coronary artery disease in his father; Hyperlipidemia in his father and mother; Hypertension in his father and mother; Stroke in his mother.    ROS:  Please see the history of present illness.  All other ROS reviewed and negative.     Physical Exam/Data:   Vitals:   04/25/23 1445 04/25/23 1515 04/25/23 1607 04/25/23 1805  BP: 131/71 115/63  (!) 141/76  Pulse: 61 (!) 59  67  Resp: (!) 0 (!) 9  14  Temp:   98 F (36.7 C) 98.3 F (36.8 C)  TempSrc:    Oral Oral  SpO2: 98% 100%  100%  Weight:      Height:       No intake or output data in the 24 hours ending 04/25/23 1819    04/25/2023    1:00 PM 01/02/2023   10:27 AM 07/30/2022    3:01 PM  Last 3 Weights  Weight (lbs) 201 lb 1 oz 198 lb 3.2 oz 194 lb  Weight (kg) 91.2 kg 89.903 kg 87.998 kg     Body mass index is 30.57 kg/m.  General:  Well nourished, well developed, in no acute distress HEENT: normal Neck: no JVD Vascular: No carotid bruits; Distal pulses 2+ bilaterally   Cardiac:  normal S1, S2; RRR; no murmur  Lungs:  clear to auscultation bilaterally, no wheezing, rhonchi or rales  Abd: soft, nontender, no hepatomegaly  Ext: no edema Musculoskeletal:  No deformities, BUE and BLE strength normal and equal Skin:  warm and dry  Neuro:  CNs 2-12 intact, no focal abnormalities noted Psych:  Normal affect    EKG:  The ECG that was done and was personally reviewed and demonstrates paced rhythm  Relevant CV Studies:  Cath 08/20/2019 The left ventricular systolic function is normal. The left ventricular ejection fraction is 55-65% by visual estimate. LV end diastolic pressure is normal.LV end diastolic pressure is normal. ------------------- There is stent from ostial to almost distal small nondominant RCA: Ost RCA to Mid RCA stent is diffusely 35% stenosed. Ost Cx to Prox Cx lesion is 60% stenosed. Ost LAD to Mid LAD stent is 10% stenosed. Mid LAD to Dist LAD lesion is 60% stenosed. Previously placed 2nd Mrg stent (DES) is widely patent. Lat 2nd Mrg lesion is 80% stenosed. Previously described. Previously placed 1st LPL stent (DES) is widely patent. LPAV lesion is 50% stenosed. LPDA stent is 5% stenosed.   Multivessel CAD with essentially patent stents in proximal to mid LAD, proximal OM1, proximal LPL1, several stents in the L PDA as well as RCA that is a small nondominant vessel.  No obvious lesions within stents noted. Ostial LCx roughly 60% --> highly DFR positive with  0.84 Long mid LAD 60% after stent--highly DFR +0.67-0.73 Normal LVEDP   With significant LAD as well as ostial LCx disease--best course of action is CVTS consultation for CABG.   Myoview 10/03/2021   The study is normal. The study is low risk.   No ST deviation was noted.   LV perfusion is normal.   Left ventricular function is normal. Nuclear stress EF: 62 %. The left ventricular ejection fraction is normal (55-65%). End diastolic cavity size is normal.   Prior study available for comparison from 08/20/2018.   Low risk stress nuclear study with normal perfusion and normal left ventricular regional and global systolic function.      Laboratory Data:  High Sensitivity Troponin:   Recent Labs  Lab 04/25/23 1250 04/25/23 1446  TROPONINIHS 7 7      Chemistry Recent Labs  Lab 04/25/23 1250  NA 139  K 4.2  CL 105  CO2 27  GLUCOSE 125*  BUN 28*  CREATININE 1.71*  CALCIUM 9.1  GFRNONAA 45*  ANIONGAP 7    Recent Labs  Lab 04/25/23 1250  PROT 7.1  ALBUMIN 4.0  AST 17  ALT 20  ALKPHOS 89  BILITOT 0.9   Lipids No results for input(s): "CHOL", "TRIG", "HDL", "LABVLDL", "LDLCALC", "CHOLHDL" in the last 168 hours. Hematology Recent Labs  Lab 04/25/23 1250  WBC 6.1  RBC 5.20  HGB 15.8  HCT 47.5  MCV 91.3  MCH 30.4  MCHC 33.3  RDW 14.1  PLT 123*   Thyroid No results for input(s): "TSH", "FREET4" in the last 168 hours. BNPNo results for input(s): "BNP", "PROBNP" in the last 168 hours.  DDimer No results for input(s): "DDIMER" in the last 168 hours.   Radiology/Studies:  DG Chest 2 View Result Date: 04/25/2023 CLINICAL DATA:  Chest pain. EXAM: CHEST - 2 VIEW COMPARISON:  11/23/2019. FINDINGS: Bilateral lung fields are clear. Bilateral costophrenic angles are clear. Normal cardio-mediastinal silhouette. There is a left sided 3-lead pacemaker. No acute osseous abnormalities. The soft tissues are within normal limits. IMPRESSION: *No active cardiopulmonary disease.  Electronically Signed   By: Jules Schick M.D.   On: 04/25/2023 14:35     Assessment and Plan:   Unstable angina  -Serial troponin negative x 2.  EKG showed paced rhythm.  Characteristic of the chest pain is similar to the previous angina, location of the angina is substernal radiating down the left arm and the left jaw.  Patient does have a separate chest pain location that is clearly musculoskeletal in nature however this is not the chest pain that brought him to the hospital.  -Will discuss with MD, consider subcu heparin, cardiac catheterization on Monday for definitive evaluation.  Will need pretreatment for contrast allergy.  CAD s/p CABG x4 LIMA-distal LAD, sequential left radial-OM2-PDA, RIMA-ramus by Dr. Levada Schilling 08/25/2019  CHB s/p BiV PPM 2018  Hypertension: Continue carvedilol, losartan  Hyperlipidemia: On Repatha and atorvastatin  DM II  CKD stage III: Creatinine 1.7   Risk Assessment/Risk Scores:    TIMI Risk Score for Unstable Angina or Non-ST Elevation MI:   The patient's TIMI risk score is 4, which indicates a 20% risk of all cause mortality, new or recurrent myocardial infarction or need for urgent revascularization in the next 14 days.      Code Status: Full Code  Severity of Illness: The appropriate patient status for this patient is INPATIENT. Inpatient status is judged to be reasonable and necessary in order to provide the required intensity of service to ensure the patient's safety. The patient's presenting symptoms, physical exam findings, and initial radiographic and laboratory data in the context of their chronic comorbidities is felt to place them at high risk for further clinical deterioration. Furthermore, it is not anticipated that the patient will be medically stable for discharge from the hospital within 2 midnights of admission.   * I certify that at the point of admission it is my clinical judgment that the patient will require inpatient hospital  care spanning beyond 2 midnights from the point of admission due to high intensity of service, high risk for further deterioration and high frequency of surveillance required.*   For questions or updates, please contact [facility] Please consult [email or web address] for contact info under     Ramond Dial, [place]  04/25/2023 6:19 PM   History and all data above reviewed.  Patient examined.  I agree with the findings as above.  The patient presents with new onset chest discomfort.  This is the first since his bypass that he can recall.  He had to take nitroglycerin for this and is not taking that he does not think since bypass.  He was severe substernal radiating to his jaw and his arm.  He said it was 10 out of 10.  He is nauseated.  He did have some improvement with nitroglycerin.  He is otherwise been active though he does not exercise routinely.  He has not been having any recent cardiovascular symptoms prior to this.  He denies any new shortness of breath, PND or orthopnea.  Has had no new palpitations, presyncope or syncope.  The patient exam reveals COR: Regular rate and rhythm, no murmurs.,  Lungs: Clear to auscultation bilaterally,  Abd: Positive bowel sounds normal frequency in pitch, Ext 2+ right radial, absent left radial, 2+ posterior tibialis bilateral, diminished dorsalis pedis bilaterally..  All available labs, radiology testing, previous records reviewed. Agree with documented assessment and plan.  Chest pain: The patient's symptoms are consistent with unstable angina.  EKG is not interpretable for ischemia.  Enzymes are unremarkable.  However, the pretest probability of obstructive coronary disease as etiology is very high.  Cardiac catheterization is indicated.  The patient understands that risks included but are not limited to stroke (1 in 1000), death (  1 in 1000), kidney failure [usually temporary] (1 in 500), bleeding (1 in 200), allergic reaction [possibly serious] (1 in 200).   The patient understands and agrees to proceed.   He will continue on his previous meds and we will continue heparin which has been started.  No need dye prophylaxis.  He also will need to have preprocedure hydration including use of contrast given his renal insufficiency.  The patient will be put on the add-on board for Monday.  John Fearing Fatin Dodson  7:07 PM  04/25/2023

## 2023-04-25 NOTE — Progress Notes (Signed)
Patient he's having chest pain 5/10 pressure like at mid chest. PRN nitroglycerin given. MD notified. Will continue to monitor.

## 2023-04-25 NOTE — Telephone Encounter (Signed)
Please see 12/27 telephone encounter.

## 2023-04-25 NOTE — ED Notes (Signed)
Report given to [facility], [facility].

## 2023-04-26 DIAGNOSIS — I200 Unstable angina: Secondary | ICD-10-CM | POA: Diagnosis not present

## 2023-04-26 LAB — HEPARIN LEVEL (UNFRACTIONATED): Heparin Unfractionated: 0.41 [IU]/mL (ref 0.30–0.70)

## 2023-04-26 LAB — GLUCOSE, CAPILLARY
Glucose-Capillary: 109 mg/dL — ABNORMAL HIGH (ref 70–99)
Glucose-Capillary: 175 mg/dL — ABNORMAL HIGH (ref 70–99)
Glucose-Capillary: 192 mg/dL — ABNORMAL HIGH (ref 70–99)
Glucose-Capillary: 149 mg/dL — ABNORMAL HIGH (ref 70–99)
Glucose-Capillary: 150 mg/dL — ABNORMAL HIGH (ref 70–99)
Glucose-Capillary: 161 mg/dL — ABNORMAL HIGH (ref 70–99)

## 2023-04-26 LAB — CBC
HCT: 47.7 % (ref 39.0–52.0)
Hemoglobin: 15.7 g/dL (ref 13.0–17.0)
MCH: 30.1 pg (ref 26.0–34.0)
MCHC: 32.9 g/dL (ref 30.0–36.0)
MCV: 91.6 fL (ref 80.0–100.0)
Platelets: 106 10*3/uL — ABNORMAL LOW (ref 150–400)
RBC: 5.21 MIL/uL (ref 4.22–5.81)
RDW: 14.1 % (ref 11.5–15.5)
WBC: 7.1 10*3/uL (ref 4.0–10.5)
nRBC: 0 % (ref 0.0–0.2)

## 2023-04-26 LAB — BASIC METABOLIC PANEL
Anion gap: 10 (ref 5–15)
BUN: 21 mg/dL (ref 8–23)
CO2: 24 mmol/L (ref 22–32)
Calcium: 9.2 mg/dL (ref 8.9–10.3)
Chloride: 105 mmol/L (ref 98–111)
Creatinine, Ser: 1.7 mg/dL — ABNORMAL HIGH (ref 0.61–1.24)
GFR, Estimated: 45 mL/min — ABNORMAL LOW (ref >60)
Glucose, Bld: 134 mg/dL — ABNORMAL HIGH (ref 70–99)
Potassium: 3.8 mmol/L (ref 3.5–5.1)
Sodium: 139 mmol/L (ref 135–145)

## 2023-04-26 LAB — LIPID PANEL
Cholesterol: 77 mg/dL (ref 0–200)
HDL: 39 mg/dL — ABNORMAL LOW (ref >40)
LDL Cholesterol: NEGATIVE mg/dL (ref 0–99)
Total CHOL/HDL Ratio: 2 {ratio}
Triglycerides: 208 mg/dL — ABNORMAL HIGH (ref <150)
VLDL: 42 mg/dL — ABNORMAL HIGH (ref 0–40)

## 2023-04-26 MED ORDER — INSULIN ASPART 100 UNIT/ML IJ SOLN
0.0000 [IU] | Freq: Three times a day (TID) | INTRAMUSCULAR | Status: DC
  Administered 2023-04-26 – 2023-04-27 (×2): 1 [IU] via SUBCUTANEOUS
  Administered 2023-04-27: 2 [IU] via SUBCUTANEOUS

## 2023-04-26 MED ORDER — INSULIN ASPART 100 UNIT/ML IJ SOLN: 0.0000 [IU] | Freq: Three times a day (TID) | INTRAMUSCULAR | Status: DC

## 2023-04-26 MED ORDER — INSULIN ASPART 100 UNIT/ML IJ SOLN: 0.0000 [IU] | Freq: Every day | INTRAMUSCULAR | Status: DC

## 2023-04-26 NOTE — Plan of Care (Signed)
  Problem: Nutrition: Goal: Adequate nutrition will be maintained Outcome: Completed/Met   Problem: Elimination: Goal: Will not experience complications related to urinary retention Outcome: Completed/Met   Problem: Skin Integrity: Goal: Risk for impaired skin integrity will decrease Outcome: Completed/Met   Problem: Nutritional: Goal: Maintenance of adequate nutrition will improve Outcome: Completed/Met   Problem: Skin Integrity: Goal: Risk for impaired skin integrity will decrease Outcome: Completed/Met

## 2023-04-26 NOTE — Plan of Care (Signed)
  Problem: Health Behavior/Discharge Planning: Goal: Ability to manage health-related needs will improve Outcome: Progressing   Problem: Clinical Measurements: Goal: Will remain free from infection Outcome: Progressing   Problem: Clinical Measurements: Goal: Diagnostic test results will improve Outcome: Progressing   Problem: Clinical Measurements: Goal: Respiratory complications will improve Outcome: Progressing   

## 2023-04-26 NOTE — Progress Notes (Signed)
   Rounding Note    Patient Name: John Dodson Date of Encounter: 04/26/2023  [facility] Cardiologist: Bryan Lemma, MD   Subjective   NAEO. No chest pain this AM.  Vital Signs    Vitals:   04/25/23 2105 04/26/23 0003 04/26/23 0430 04/26/23 0807  BP: (!) 124/54 117/69 137/71 124/74  Pulse: 66 65 64 80  Resp: 18 18 18 18   Temp: 98 F (36.7 C) 97.9 F (36.6 C) 98.2 F (36.8 C)   TempSrc: Oral Oral Oral Oral  SpO2: 100% 96% 97% 100%  Weight:      Height:        Intake/Output Summary (Last 24 hours) at 04/26/2023 1128 Last data filed at 04/26/2023 1000 Gross per 24 hour  Intake 691.85 ml  Output 1900 ml  Net -1208.15 ml      04/25/2023    1:00 PM 01/02/2023   10:27 AM 07/30/2022    3:01 PM  Last 3 Weights  Weight (lbs) 201 lb 1 oz 198 lb 3.2 oz 194 lb  Weight (kg) 91.2 kg 89.903 kg 87.998 kg      Telemetry    Personally Reviewed  ECG    Personally Reviewed  Physical Exam   GEN: No acute distress.   Cardiac: RRR, no murmurs, rubs, or gallops.  Respiratory: Clear to auscultation bilaterally. Psych: Normal affect   Assessment & Plan    #Unstable angina Now CP free. Hemodynamically stable. - cont heparin gtt - cont aspirin/plavix/statin - LHC planned for Monday  #CKD3b Cr stable at 1.7  #CAD s/p CABG In 2021.  #CHB s/p CRT  #HTN  Sheria Lang T. Lalla Brothers, MD, [facility], [facility] Cardiac Electrophysiology

## 2023-04-26 NOTE — Progress Notes (Signed)
PHARMACY - ANTICOAGULATION CONSULT NOTE  Pharmacy Consult for heparin Indication: chest pain/ACS  Allergies  Allergen Reactions   Integrilin [Eptifibatide] Shortness Of Breath and Other (See Comments)    Reportedly had shortness of breath, confusion.   Latex Rash   Codeine Nausea And Vomiting   Tylenol [Acetaminophen] Other (See Comments)    Tylenol -3 with codiene   Zithromax [Azithromycin] Nausea And Vomiting   Contrast Media [Iodinated Contrast Media] Rash and Other (See Comments)    Reportedly cardiac arrest   Glipizide Rash and Other (See Comments)    Headache    Patient Measurements: Height: 5\' 8"  (172.7 cm) Weight: 91.2 kg (201 lb 1 oz) IBW/kg (Calculated) : 68.4 Heparin Dosing Weight: 87.2kg  Vital Signs: Temp: 98.2 F (36.8 C) (12/28 0430) Temp Source: Oral (12/28 0430) BP: 137/71 (12/28 0430) Pulse Rate: 64 (12/28 0430)  Labs: Recent Labs    04/25/23 1250 04/25/23 1446 04/25/23 2046 04/26/23 0231  HGB 15.8  --   --  15.7  HCT 47.5  --   --  47.7  PLT 123*  --   --  106*  HEPARINUNFRC  --   --  0.36 0.41  CREATININE 1.71*  --   --  1.70*  TROPONINIHS 7 7  --   --     Estimated Creatinine Clearance: 50 mL/min (A) (by C-G formula based on SCr of 1.7 mg/dL (H)).   Medical History: Past Medical History:  Diagnosis Date   Blepharitis of both eyes    Chronic   Cataracts, both eyes    CKD (chronic kidney disease) stage 3, GFR 30-59 ml/min (HCC)    s/p R Nephrectomy/adrenalectomy & DM-Nephropathy (baseline creatinine 1.4-1.5)   COVID-19 virus infection 04/25/2019   Diabetic peripheral neuropathy associated with type 2 diabetes mellitus (HCC)    Essential hypertension    GERD without esophagitis    History of basal cell carcinoma (BCC) excision    History of complete heart block 06/2016   s/p BiV PPM (now followed by Dr. Hardie Shackleton - EP)   History of non-ST elevation myocardial infarction (NSTEMI)    And several occasions of unstable angina   Hx of  CABGx4 08/25/2019   (Dr. Morrell Riddle, [name]): LIMA-LAD, Seq Rad- OM2-LPDA, RIMA-? OM1 (called Ramus on report, but no ramus seen on cath).   Hyperlipidemia associated with type 2 diabetes mellitus (HCC)    on atorvastatin 40 mg.   Multiple Vessel CAD -- s/p PCI of LAD, OM1, LPL1, LPDA & non-dominant RCA --> now S/P CABG x 4 60%   (No cath report prior to 2016) (stents in proxLAD, prox OM1 x 2, prox LPL1 & 3-4 in L PDA & ~2 in  Known non-dominant RCA;  07/2019: MV CAD - patent stents in p-m LAD, p OM1, p LPL & LPDA as well as RCA -> Ost LCx ~60-70% (DFR ++), long mLAD 60% post-stent (DFR ++) --> referred for CABG   Obesity (BMI 30.0-34.9)    Occlusion of left vertebral artery    Consider repossible acute lesion in August 2020 with presentation of headache.  CTA suggested occluded left vertebral artery throughout the neck.  Faint string-like enhancement intermittently visible suggesting recent occlusion.  Partial reconstruction and posterior fossa.  Left PICA patent.  (Consider possible left vertebral artery dissection)   OSA treated with BiPAP 02/2019   Diagnosis in late 2020 ([facility] [place])--> delayed onset of treatment with BiPAP due to Covid hospitalization followed by PE. ->  BiPAP setting at  21/17 cm   Pulmonary embolism (HCC) 05/18/2019   (1 month following COVID-19 infection): DVT-PE (bilateral PEs noted on VQ scan)-> started on warfarin.  (On warfarin plus Plavix now with aspirin stopped.)   Type 2 diabetes mellitus with complication, with long-term current use of insulin (HCC)    On Lantus, Jardiance, Metformin & Onglyza    Medications:  Infusions:   heparin 1,100 Units/hr (04/25/23 1407)    Assessment: 62 yom presented to the ED with CP. To start IV heparin. Baseline Hgb is WNL but platelets were low at 123. He is not on anticoagulation PTA.   Heparin level this AM 0.41 (at goal). PLT down to 106  Goal of Therapy:  Heparin level 0.3-0.7 units/ml Monitor platelets by  anticoagulation protocol: Yes   Plan:  Continue heparin gtt 1100 units/hr Daily heparin level and CBC Plans for cardiac cath Monday noted  Jettie Lazare A. Jeanella Craze, PharmD, BCPS, FNKF Clinical Pharmacist [place] Please utilize Amion for appropriate phone number to reach the unit pharmacist [facility])

## 2023-04-27 DIAGNOSIS — I200 Unstable angina: Secondary | ICD-10-CM | POA: Diagnosis not present

## 2023-04-27 LAB — URINALYSIS, ROUTINE W REFLEX MICROSCOPIC
Bacteria, UA: NONE SEEN
Bilirubin Urine: NEGATIVE
Glucose, UA: >=500 mg/dL — AB
Ketones, ur: NEGATIVE mg/dL
Leukocytes,Ua: NEGATIVE
Nitrite: NEGATIVE
Protein, ur: NEGATIVE mg/dL
Specific Gravity, Urine: 1.011 (ref 1.005–1.030)
pH: 6 (ref 5.0–8.0)

## 2023-04-27 LAB — GLUCOSE, CAPILLARY
Glucose-Capillary: 113 mg/dL — ABNORMAL HIGH (ref 70–99)
Glucose-Capillary: 168 mg/dL — ABNORMAL HIGH (ref 70–99)
Glucose-Capillary: 171 mg/dL — ABNORMAL HIGH (ref 70–99)
Glucose-Capillary: 144 mg/dL — ABNORMAL HIGH (ref 70–99)
Glucose-Capillary: 169 mg/dL — ABNORMAL HIGH (ref 70–99)

## 2023-04-27 LAB — CBC
HCT: 48.9 % (ref 39.0–52.0)
Hemoglobin: 16.1 g/dL (ref 13.0–17.0)
MCH: 30.3 pg (ref 26.0–34.0)
MCHC: 32.9 g/dL (ref 30.0–36.0)
MCV: 92.1 fL (ref 80.0–100.0)
Platelets: 100 10*3/uL — ABNORMAL LOW (ref 150–400)
RBC: 5.31 MIL/uL (ref 4.22–5.81)
RDW: 14.1 % (ref 11.5–15.5)
WBC: 6.5 10*3/uL (ref 4.0–10.5)
nRBC: 0 % (ref 0.0–0.2)

## 2023-04-27 LAB — BASIC METABOLIC PANEL
Anion gap: 9 (ref 5–15)
BUN: 27 mg/dL — ABNORMAL HIGH (ref 8–23)
CO2: 27 mmol/L (ref 22–32)
Calcium: 9.8 mg/dL (ref 8.9–10.3)
Chloride: 101 mmol/L (ref 98–111)
Creatinine, Ser: 1.79 mg/dL — ABNORMAL HIGH (ref 0.61–1.24)
GFR, Estimated: 43 mL/min — ABNORMAL LOW (ref >60)
Glucose, Bld: 226 mg/dL — ABNORMAL HIGH (ref 70–99)
Potassium: 5 mmol/L (ref 3.5–5.1)
Sodium: 137 mmol/L (ref 135–145)

## 2023-04-27 LAB — HEPARIN LEVEL (UNFRACTIONATED): Heparin Unfractionated: 0.48 [IU]/mL (ref 0.30–0.70)

## 2023-04-27 MED ORDER — DIPHENHYDRAMINE HCL 25 MG PO CAPS: 50.0000 mg | ORAL_CAPSULE | Freq: Once | ORAL | Status: AC

## 2023-04-27 MED ORDER — DIPHENHYDRAMINE HCL 50 MG/ML IJ SOLN
50.0000 mg | Freq: Once | INTRAMUSCULAR | Status: AC
  Administered 2023-04-28: 50 mg via INTRAVENOUS
  Filled 2023-04-27: qty 1

## 2023-04-27 MED ORDER — ACETAMINOPHEN 325 MG PO TABS
650.0000 mg | ORAL_TABLET | Freq: Four times a day (QID) | ORAL | Status: DC | PRN
  Administered 2023-04-27: 650 mg via ORAL
  Filled 2023-04-27: qty 2

## 2023-04-27 MED ORDER — SODIUM CHLORIDE 0.9 % WEIGHT BASED INFUSION
1.0000 mL/kg/h | INTRAVENOUS | Status: DC
  Administered 2023-04-28: 1 mL/kg/h via INTRAVENOUS

## 2023-04-27 MED ORDER — ASPIRIN 81 MG PO CHEW
81.0000 mg | CHEWABLE_TABLET | ORAL | Status: AC
  Administered 2023-04-28: 81 mg via ORAL
  Filled 2023-04-27: qty 1

## 2023-04-27 MED ORDER — SODIUM CHLORIDE 0.9 % WEIGHT BASED INFUSION
3.0000 mL/kg/h | INTRAVENOUS | Status: DC
  Administered 2023-04-28: 3 mL/kg/h via INTRAVENOUS

## 2023-04-27 MED ORDER — PREDNISONE 20 MG PO TABS
50.0000 mg | ORAL_TABLET | Freq: Four times a day (QID) | ORAL | Status: AC
  Administered 2023-04-27 – 2023-04-28 (×3): 50 mg via ORAL
  Filled 2023-04-27 (×3): qty 1

## 2023-04-27 NOTE — Plan of Care (Signed)
  Problem: Clinical Measurements: Goal: Cardiovascular complication will be avoided Outcome: Progressing   Problem: Coping: Goal: Level of anxiety will decrease Outcome: Progressing

## 2023-04-27 NOTE — Progress Notes (Signed)
° °  Rounding Note    Patient Name: John Dodson Date of Encounter: 04/27/2023  [facility] Cardiologist: Bryan Lemma, MD   Subjective   NAEO. No chest pain this AM. Washing this AM.  Vital Signs    Vitals:   04/27/23 0015 04/27/23 0347 04/27/23 0611 04/27/23 0826  BP: (!) 142/71 133/69  131/62  Pulse: 60 60  73  Resp: 18 18  18   Temp: 97.8 F (36.6 C) 98.3 F (36.8 C)  97.8 F (36.6 C)  TempSrc: Oral Oral  Oral  SpO2: 95% 95%  100%  Weight:   87.8 kg   Height:        Intake/Output Summary (Last 24 hours) at 04/27/2023 1011 Last data filed at 04/27/2023 0827 Gross per 24 hour  Intake 877 ml  Output 2665 ml  Net -1788 ml      04/27/2023    6:11 AM 04/25/2023    1:00 PM 01/02/2023   10:27 AM  Last 3 Weights  Weight (lbs) 193 lb 8 oz 201 lb 1 oz 198 lb 3.2 oz  Weight (kg) 87.771 kg 91.2 kg 89.903 kg      Telemetry    Personally Reviewed  ECG    Personally Reviewed  Physical Exam   GEN: No acute distress.   Cardiac: RRR, no murmurs, rubs, or gallops.  Respiratory: Clear to auscultation bilaterally. Psych: Normal affect   Assessment & Plan    #Unstable angina Now CP free. Hemodynamically stable. - cont heparin gtt - cont aspirin/plavix/statin - LHC planned for Monday  #CKD3b Cr stable  #CAD s/p CABG In 2021.  #CHB s/p CRT  #HTN  Sheria Lang T. Lalla Brothers, MD, [facility], [facility] Cardiac Electrophysiology

## 2023-04-27 NOTE — Plan of Care (Signed)
°  Problem: Pain Management: Goal: General experience of comfort will improve Outcome: Completed/Met   Problem: Safety: Goal: Ability to remain free from injury will improve Outcome: Completed/Met

## 2023-04-27 NOTE — H&P (View-Only) (Signed)
   Rounding Note    Patient Name: John Dodson Date of Encounter: 04/27/2023  [facility] Cardiologist: Bryan Lemma, MD   Subjective   NAEO. No chest pain this AM. Washing this AM.  Vital Signs    Vitals:   04/27/23 0015 04/27/23 0347 04/27/23 0611 04/27/23 0826  BP: (!) 142/71 133/69  131/62  Pulse: 60 60  73  Resp: 18 18  18   Temp: 97.8 F (36.6 C) 98.3 F (36.8 C)  97.8 F (36.6 C)  TempSrc: Oral Oral  Oral  SpO2: 95% 95%  100%  Weight:   87.8 kg   Height:        Intake/Output Summary (Last 24 hours) at 04/27/2023 1011 Last data filed at 04/27/2023 0827 Gross per 24 hour  Intake 877 ml  Output 2665 ml  Net -1788 ml      04/27/2023    6:11 AM 04/25/2023    1:00 PM 01/02/2023   10:27 AM  Last 3 Weights  Weight (lbs) 193 lb 8 oz 201 lb 1 oz 198 lb 3.2 oz  Weight (kg) 87.771 kg 91.2 kg 89.903 kg      Telemetry    Personally Reviewed  ECG    Personally Reviewed  Physical Exam   GEN: No acute distress.   Cardiac: RRR, no murmurs, rubs, or gallops.  Respiratory: Clear to auscultation bilaterally. Psych: Normal affect   Assessment & Plan    #Unstable angina Now CP free. Hemodynamically stable. - cont heparin gtt - cont aspirin/plavix/statin - LHC planned for Monday  #CKD3b Cr stable  #CAD s/p CABG In 2021.  #CHB s/p CRT  #HTN  Sheria Lang T. Lalla Brothers, MD, [facility], [facility] Cardiac Electrophysiology

## 2023-04-27 NOTE — Progress Notes (Signed)
PHARMACY - ANTICOAGULATION CONSULT NOTE  Pharmacy Consult for heparin Indication: chest pain/ACS  Allergies  Allergen Reactions   Integrilin [Eptifibatide] Shortness Of Breath and Other (See Comments)    Reportedly had shortness of breath, confusion.   Latex Rash   Codeine Nausea And Vomiting   Tylenol [Acetaminophen] Other (See Comments)    Tylenol -3 with codiene   Zithromax [Azithromycin] Nausea And Vomiting   Contrast Media [Iodinated Contrast Media] Rash and Other (See Comments)    Reportedly cardiac arrest   Glipizide Rash and Other (See Comments)    Headache    Patient Measurements: Height: 5\' 8"  (172.7 cm) Weight: 87.8 kg (193 lb 8 oz) IBW/kg (Calculated) : 68.4 Heparin Dosing Weight: 87.2kg  Vital Signs: Temp: 98.3 F (36.8 C) (12/29 0347) Temp Source: Oral (12/29 0347) BP: 133/69 (12/29 0347) Pulse Rate: 60 (12/29 0347)  Labs: Recent Labs    04/25/23 1250 04/25/23 1446 04/25/23 2046 04/26/23 0231 04/27/23 0221  HGB 15.8  --   --  15.7 16.1  HCT 47.5  --   --  47.7 48.9  PLT 123*  --   --  106* 100*  HEPARINUNFRC  --   --  0.36 0.41 0.48  CREATININE 1.71*  --   --  1.70*  --   TROPONINIHS 7 7  --   --   --     Estimated Creatinine Clearance: 49.2 mL/min (A) (by C-G formula based on SCr of 1.7 mg/dL (H)).   Medical History: Past Medical History:  Diagnosis Date   Blepharitis of both eyes    Chronic   Cataracts, both eyes    CKD (chronic kidney disease) stage 3, GFR 30-59 ml/min (HCC)    s/p R Nephrectomy/adrenalectomy & DM-Nephropathy (baseline creatinine 1.4-1.5)   COVID-19 virus infection 04/25/2019   Diabetic peripheral neuropathy associated with type 2 diabetes mellitus (HCC)    Essential hypertension    GERD without esophagitis    History of basal cell carcinoma (BCC) excision    History of complete heart block 06/2016   s/p BiV PPM (now followed by Dr. Hardie Shackleton - EP)   History of non-ST elevation myocardial infarction (NSTEMI)    And  several occasions of unstable angina   Hx of CABGx4 08/25/2019   (Dr. Morrell R[facility], [name]): LIMA-LAD, Seq Rad- OM2-LPDA, RIMA-? OM1 (called Ramus on report, but no ramus seen on cath).   Hyperlipidemia associated with type 2 diabetes mellitus (HCC)    on atorvastatin 40 mg.   Multiple Vessel CAD -- s/p PCI of LAD, OM1, LPL1, LPDA & non-dominant RCA --> now S/P CABG x 4 60%   (No cath report prior to 2016) (stents in proxLAD, prox OM1 x 2, prox LPL1 & 3-4 in L PDA & ~2 in  Known non-dominant RCA;  07/2019: MV CAD - patent stents in p-m LAD, p OM1, p LPL & LPDA as well as RCA -> Ost LCx ~60-70% (DFR ++), long mLAD 60% post-stent (DFR ++) --> referred for CABG   Obesity (BMI 30.0-34.9)    Occlusion of left vertebral artery    Consider repossible acute lesion in August 2020 with presentation of headache.  CTA suggested occluded left vertebral artery throughout the neck.  Faint string-like enhancement intermittently visible suggesting recent occlusion.  Partial reconstruction and posterior fossa.  Left PICA patent.  (Consider possible left vertebral artery dissection)   OSA treated with BiPAP 02/2019   Diagnosis in late 2020 ([facility] [place])--> delayed onset of treatment with BiPAP  due to Covid hospitalization followed by PE. ->  BiPAP setting at 21/17 cm   Pulmonary embolism (HCC) 05/18/2019   (1 month following COVID-19 infection): DVT-PE (bilateral PEs noted on VQ scan)-> started on warfarin.  (On warfarin plus Plavix now with aspirin stopped.)   Type 2 diabetes mellitus with complication, with long-term current use of insulin (HCC)    On Lantus, Jardiance, Metformin & Onglyza    Medications:  Infusions:   heparin 1,100 Units/hr (04/26/23 1043)    Assessment: 62 yom presented to the ED with CP. To start IV heparin. Baseline Hgb is WNL but platelets were low at 123. He is not on anticoagulation PTA.   Heparin level this AM 0.48 (at goal). PLT down to 100  Goal of Therapy:  Heparin level  0.3-0.7 units/ml Monitor platelets by anticoagulation protocol: Yes   Plan:  Continue heparin gtt 1100 units/hr Daily heparin level and CBC Plans for cardiac cath Monday noted  Jeanella Cara, PharmD, [facility] Clinical Pharmacist Please see AMION for all Pharmacists' Contact Phone Numbers 04/27/2023, 7:41 AM

## 2023-04-28 ENCOUNTER — Inpatient Hospital Stay (HOSPITAL_COMMUNITY)
Admission: EM | Disposition: A | Discharge status: Home / Self Care | Payer: Self-pay | Source: Home / Self Care | Attending: Cardiology

## 2023-04-28 DIAGNOSIS — I25110 Atherosclerotic heart disease of native coronary artery with unstable angina pectoris: Secondary | ICD-10-CM | POA: Diagnosis not present

## 2023-04-28 DIAGNOSIS — Z951 Presence of aortocoronary bypass graft: Secondary | ICD-10-CM | POA: Diagnosis not present

## 2023-04-28 HISTORY — PX: LEFT HEART CATH AND CORS/GRAFTS ANGIOGRAPHY: CATH118250

## 2023-04-28 LAB — BASIC METABOLIC PANEL
Anion gap: 12 (ref 5–15)
BUN: 32 mg/dL — ABNORMAL HIGH (ref 8–23)
CO2: 21 mmol/L — ABNORMAL LOW (ref 22–32)
Calcium: 9.5 mg/dL (ref 8.9–10.3)
Chloride: 106 mmol/L (ref 98–111)
Creatinine, Ser: 1.85 mg/dL — ABNORMAL HIGH (ref 0.61–1.24)
GFR, Estimated: 41 mL/min — ABNORMAL LOW (ref >60)
Glucose, Bld: 200 mg/dL — ABNORMAL HIGH (ref 70–99)
Potassium: 4.4 mmol/L (ref 3.5–5.1)
Sodium: 139 mmol/L (ref 135–145)

## 2023-04-28 LAB — CBC
HCT: 49.1 % (ref 39.0–52.0)
Hemoglobin: 16.1 g/dL (ref 13.0–17.0)
MCH: 29.8 pg (ref 26.0–34.0)
MCHC: 32.8 g/dL (ref 30.0–36.0)
MCV: 90.9 fL (ref 80.0–100.0)
Platelets: 102 10*3/uL — ABNORMAL LOW (ref 150–400)
RBC: 5.4 MIL/uL (ref 4.22–5.81)
RDW: 14.2 % (ref 11.5–15.5)
WBC: 7.3 10*3/uL (ref 4.0–10.5)
nRBC: 0 % (ref 0.0–0.2)

## 2023-04-28 LAB — GLUCOSE, CAPILLARY
Glucose-Capillary: 191 mg/dL — ABNORMAL HIGH (ref 70–99)
Glucose-Capillary: 153 mg/dL — ABNORMAL HIGH (ref 70–99)

## 2023-04-28 LAB — HEPARIN LEVEL (UNFRACTIONATED): Heparin Unfractionated: 0.32 [IU]/mL (ref 0.30–0.70)

## 2023-04-28 LAB — LIPOPROTEIN A (LPA): Lipoprotein (a): 10.6 nmol/L (ref <75.0)

## 2023-04-28 SURGERY — LEFT HEART CATH AND CORS/GRAFTS ANGIOGRAPHY
Anesthesia: LOCAL

## 2023-04-28 MED ORDER — LIDOCAINE HCL (PF) 1 % IJ SOLN
INTRAMUSCULAR | Status: AC
  Filled 2023-04-28: qty 30

## 2023-04-28 MED ORDER — FENTANYL CITRATE (PF) 100 MCG/2ML IJ SOLN
INTRAMUSCULAR | Status: DC | PRN
  Administered 2023-04-28: 25 ug via INTRAVENOUS

## 2023-04-28 MED ORDER — LIDOCAINE HCL (PF) 1 % IJ SOLN
INTRAMUSCULAR | Status: DC | PRN
  Administered 2023-04-28: 10 mL

## 2023-04-28 MED ORDER — IOHEXOL 350 MG/ML SOLN
INTRAVENOUS | Status: DC | PRN
  Administered 2023-04-28: 105 mL

## 2023-04-28 MED ORDER — VERAPAMIL HCL 2.5 MG/ML IV SOLN
INTRAVENOUS | Status: AC
  Filled 2023-04-28: qty 2

## 2023-04-28 MED ORDER — MIDAZOLAM HCL 2 MG/2ML IJ SOLN
INTRAMUSCULAR | Status: DC | PRN
  Administered 2023-04-28: 1 mg via INTRAVENOUS

## 2023-04-28 MED ORDER — MIDAZOLAM HCL 2 MG/2ML IJ SOLN
INTRAMUSCULAR | Status: AC
  Filled 2023-04-28: qty 2

## 2023-04-28 MED ORDER — RANOLAZINE ER 1000 MG PO TB12: 1000.0000 mg | ORAL_TABLET | Freq: Two times a day (BID) | ORAL | 2 refills | Status: DC

## 2023-04-28 MED ORDER — HEPARIN (PORCINE) IN NACL 1000-0.9 UT/500ML-% IV SOLN
INTRAVENOUS | Status: DC | PRN
  Administered 2023-04-28 (×2): 500 mL

## 2023-04-28 MED ORDER — FENTANYL CITRATE (PF) 100 MCG/2ML IJ SOLN
INTRAMUSCULAR | Status: AC
  Filled 2023-04-28: qty 2

## 2023-04-28 MED ORDER — RANOLAZINE ER 500 MG PO TB12: 1000.0000 mg | ORAL_TABLET | Freq: Two times a day (BID) | ORAL | Status: DC

## 2023-04-28 SURGICAL SUPPLY — 9 items
CATH INFINITI 5 FR 3DRC (CATHETERS) IMPLANT
CATH INFINITI 5FR AL1 (CATHETERS) IMPLANT
CATH INFINITI 5FR MULTPACK ANG (CATHETERS) IMPLANT
KIT MICROPUNCTURE NIT STIFF (SHEATH) IMPLANT
PACK CARDIAC CATHETERIZATION (CUSTOM PROCEDURE TRAY) ×1 IMPLANT
SET ATX-X65L (MISCELLANEOUS) IMPLANT
SHEATH PINNACLE 5F 10CM (SHEATH) IMPLANT
SHEATH PROBE COVER 6X72 (BAG) IMPLANT
WIRE EMERALD 3MM-J .035X150CM (WIRE) IMPLANT

## 2023-04-28 NOTE — Plan of Care (Signed)

## 2023-04-28 NOTE — Plan of Care (Signed)
  Problem: Education: Goal: Knowledge of General Education information will improve Description: Including pain rating scale, medication(s)/side effects and non-pharmacologic comfort measures Outcome: Progressing   Problem: Health Behavior/Discharge Planning: Goal: Ability to manage health-related needs will improve Outcome: Progressing   Problem: Clinical Measurements: Goal: Ability to maintain clinical measurements within normal limits will improve Outcome: Progressing Goal: Will remain free from infection Outcome: Progressing Goal: Diagnostic test results will improve Outcome: Progressing Goal: Respiratory complications will improve Outcome: Progressing Goal: Cardiovascular complication will be avoided Outcome: Progressing   Problem: Activity: Goal: Risk for activity intolerance will decrease Outcome: Progressing   Problem: Coping: Goal: Level of anxiety will decrease Outcome: Progressing   Problem: Elimination: Goal: Will not experience complications related to bowel motility Outcome: Progressing   Problem: Education: Goal: Understanding of cardiac disease, CV risk reduction, and recovery process will improve Outcome: Progressing Goal: Individualized Educational Video(s) Outcome: Progressing   Problem: Activity: Goal: Ability to tolerate increased activity will improve Outcome: Progressing   Problem: Cardiac: Goal: Ability to achieve and maintain adequate cardiovascular perfusion will improve Outcome: Progressing   Problem: Health Behavior/Discharge Planning: Goal: Ability to safely manage health-related needs after discharge will improve Outcome: Progressing   Problem: Education: Goal: Ability to describe self-care measures that may prevent or decrease complications (Diabetes Survival Skills Education) will improve Outcome: Progressing Goal: Individualized Educational Video(s) Outcome: Progressing   Problem: Coping: Goal: Ability to adjust to condition  or change in health will improve Outcome: Progressing   Problem: Fluid Volume: Goal: Ability to maintain a balanced intake and output will improve Outcome: Progressing   Problem: Health Behavior/Discharge Planning: Goal: Ability to identify and utilize available resources and services will improve Outcome: Progressing Goal: Ability to manage health-related needs will improve Outcome: Progressing   Problem: Metabolic: Goal: Ability to maintain appropriate glucose levels will improve Outcome: Progressing   Problem: Nutritional: Goal: Progress toward achieving an optimal weight will improve Outcome: Progressing   Problem: Tissue Perfusion: Goal: Adequacy of tissue perfusion will improve Outcome: Progressing   Problem: Education: Goal: Understanding of CV disease, CV risk reduction, and recovery process will improve Outcome: Progressing Goal: Individualized Educational Video(s) Outcome: Progressing   Problem: Activity: Goal: Ability to return to baseline activity level will improve Outcome: Progressing   Problem: Cardiovascular: Goal: Ability to achieve and maintain adequate cardiovascular perfusion will improve Outcome: Progressing Goal: Vascular access site(s) Level 0-1 will be maintained Outcome: Progressing   Problem: Health Behavior/Discharge Planning: Goal: Ability to safely manage health-related needs after discharge will improve Outcome: Progressing

## 2023-04-28 NOTE — Progress Notes (Signed)
PHARMACY - ANTICOAGULATION CONSULT NOTE  Pharmacy Consult for heparin Indication: chest pain/ACS  Allergies  Allergen Reactions   Integrilin [Eptifibatide] Shortness Of Breath and Other (See Comments)    Reportedly had shortness of breath, confusion.   Latex Rash   Codeine Nausea And Vomiting   Tylenol [Acetaminophen] Other (See Comments)    Tylenol -3 with codiene   Zithromax [Azithromycin] Nausea And Vomiting   Contrast Media [Iodinated Contrast Media] Rash and Other (See Comments)    Reportedly cardiac arrest   Glipizide Rash and Other (See Comments)    Headache    Patient Measurements: Height: 5\' 8"  (172.7 cm) Weight: 87.8 kg (193 lb 8 oz) IBW/kg (Calculated) : 68.4 Heparin Dosing Weight: 87.2kg  Vital Signs: Temp: 97.8 F (36.6 C) (12/30 0700) Temp Source: Oral (12/30 0700) BP: 126/68 (12/30 0700) Pulse Rate: 76 (12/30 0700)  Labs: Recent Labs    04/25/23 1250 04/25/23 1446 04/25/23 2046 04/26/23 0231 04/27/23 0221 04/27/23 1018 04/28/23 0245  HGB 15.8  --   --  15.7 16.1  --  16.1  HCT 47.5  --   --  47.7 48.9  --  49.1  PLT 123*  --   --  106* 100*  --  102*  HEPARINUNFRC  --   --    < > 0.41 0.48  --  0.32  CREATININE 1.71*  --   --  1.70*  --  1.79* 1.85*  TROPONINIHS 7 7  --   --   --   --   --    < > = values in this interval not displayed.    Estimated Creatinine Clearance: 45.2 mL/min (A) (by C-G formula based on SCr of 1.85 mg/dL (H)).   Medical History: Past Medical History:  Diagnosis Date   Blepharitis of both eyes    Chronic   Cataracts, both eyes    CKD (chronic kidney disease) stage 3, GFR 30-59 ml/min (HCC)    s/p R Nephrectomy/adrenalectomy & DM-Nephropathy (baseline creatinine 1.4-1.5)   COVID-19 virus infection 04/25/2019   Diabetic peripheral neuropathy associated with type 2 diabetes mellitus (HCC)    Essential hypertension    GERD without esophagitis    History of basal cell carcinoma (BCC) excision    History of complete  heart block 06/2016   s/p BiV PPM (now followed by Dr. Hardie Shackleton - EP)   History of non-ST elevation myocardial infarction (NSTEMI)    And several occasions of unstable angina   Hx of CABGx4 08/25/2019   (Dr. [facility]rell [facility], [name]): LIMA-LAD, Seq Rad- OM2-LPDA, RIMA-? OM1 (called Ramus on report, but no ramus seen on cath).   Hyperlipidemia associated with type 2 diabetes mellitus (HCC)    on atorvastatin 40 mg.   Multiple Vessel CAD -- s/p PCI of LAD, OM1, LPL1, LPDA & non-dominant RCA --> now S/P CABG x 4 60%   (No cath report prior to 2016) (stents in proxLAD, prox OM1 x 2, prox LPL1 & 3-4 in L PDA & ~2 in  Known non-dominant RCA;  07/2019: MV CAD - patent stents in p-m LAD, p OM1, p LPL & LPDA as well as RCA -> Ost LCx ~60-70% (DFR ++), long mLAD 60% post-stent (DFR ++) --> referred for CABG   Obesity (BMI 30.0-34.9)    Occlusion of left vertebral artery    Consider repossible acute lesion in August 2020 with presentation of headache.  CTA suggested occluded left vertebral artery throughout the neck.  Faint string-like enhancement intermittently visible  suggesting recent occlusion.  Partial reconstruction and posterior fossa.  Left PICA patent.  (Consider possible left vertebral artery dissection)   OSA treated with BiPAP 02/2019   Diagnosis in late 2020 ([facility] [place])--> delayed onset of treatment with BiPAP due to Covid hospitalization followed by PE. ->  BiPAP setting at 21/17 cm   Pulmonary embolism (HCC) 05/18/2019   (1 month following COVID-19 infection): DVT-PE (bilateral PEs noted on VQ scan)-> started on warfarin.  (On warfarin plus Plavix now with aspirin stopped.)   Type 2 diabetes mellitus with complication, with long-term current use of insulin (HCC)    On Lantus, Jardiance, Metformin & Onglyza    Medications:  Infusions:   sodium chloride 1 mL/kg/hr (04/28/23 0145)   heparin 1,100 Units/hr (04/27/23 1322)    Assessment: 62 yom presented to the ED with CP. To start IV  heparin. Baseline Hgb is WNL but platelets were low at 123. He is not on anticoagulation PTA.   Heparin level this morning was therapeutic at 0.32 on 1100 units/hr. This is lower end of therapeutic, but with PLT count down to 102 and noted plans for cardiac catheterization today, will not make any adjustments. Hgb stable at 16.1. No issues with bleeding or infusion per RN.  Goal of Therapy:  Heparin level 0.3-0.7 units/ml Monitor platelets by anticoagulation protocol: Yes   Plan:  Continue heparin gtt 1100 units/hr Daily heparin level and CBC Follow-up plans for anticoagulation following cardiac catheterization today  Lennie Muckle, PharmD PGY1 Pharmacy Resident 04/28/2023 7:52 AM

## 2023-04-28 NOTE — Interval H&P Note (Signed)
History and Physical Interval Note:  04/28/2023 9:59 AM  John Dodson  has presented today for surgery, with the diagnosis of unstable angina.  The various methods of treatment have been discussed with the patient and family. After consideration of risks, benefits and other options for treatment, the patient has consented to  Procedure(s): LEFT HEART CATH AND CORS/GRAFTS ANGIOGRAPHY (N/A)  PERCUTANEOUS CORONARY INTERVENTION  as a surgical intervention.  The patient's history has been reviewed, patient examined, no change in status, stable for surgery.  I have reviewed the patient's chart and labs.  Questions were answered to the patient's satisfaction.     Cath Lab Visit (complete for each Cath Lab visit)  Clinical Evaluation Leading to the Procedure:   ACS: Yes.    Non-ACS:    Anginal Classification: CCS IV  Anti-ischemic medical therapy: Minimal Therapy (1 class of medications)  Non-Invasive Test Results: No non-invasive testing performed  Prior CABG: Previous CABG    Bryan Lemma

## 2023-04-28 NOTE — Progress Notes (Signed)
Site area: Right femoral sheath  Site Prior to Removal:  Level 0  Pressure Applied: 25 minutes  Manual: Yes  Patient Status During Pull: Stable  Post Sheath Removal Site:  Level 0  Post Sheath Removal Instructions Given:  yes  Post Sheath Pulses Present: rt dp palpable  Dressing Applied: gauze and tegaderm   Bedrest: Begins @1215   Comments:

## 2023-04-28 NOTE — Discharge Summary (Signed)
Discharge Summary    Patient ID: ANCE REDIC MRN: [id number]; DOB: Dec 27, 1960  Admit date: 04/25/2023 Discharge date: 04/28/2023  PCP:  Andreas Blower., MD   [facility] Providers Cardiologist:  Bryan Lemma, MD  Electrophysiologist:  Lewayne Bunting, MD    Discharge Diagnoses    Principal Problem:   Chest pain Active Problems:   Essential hypertension   Type 2 diabetes mellitus with complication, with long-term current use of insulin (HCC)   S/P CABG x 4: LIMA-LAD, L rad-OM2-PDA, RIMA-OM1   Unstable angina [facility])  Diagnostic Studies/Procedures    Cath: 04/28/2023    Ost LAD to Mid LAD lesion is 10% stenosed. Mid LAD to Dist LAD lesion is 100% stenosed.   Ost Cx to Prox Cx lesion is 65% stenosed.->  Previously evaluated prior to CABG as FFR positive   LPAV lesion is 50% stenosed.   Lat 2nd Mrg lesion is 80% stenosed.   Previously placed LPDA stent is 5% stenosed.   Ost RCA to Mid RCA STENT is 100% stenosed.   Previously placed 2nd Mrg stent of unknown type is  widely patent.   Previously placed 1st LPL stent of unknown type is  widely patent.   GRAFTS.   RIMA-PDA graft was visualized by angiography and is moderate in size. The graft exhibits no disease.   LIMA-LAD graft was visualized by angiography and is normal in caliber. The graft exhibits no disease.   Seq LRAD-OM1*LPL1(OM2) graft was injected, but not not visualized due to occlusion. Origin to Prox Graft lesion before Lat 2nd Mrg is 100% stenosed. ->  Does not appear to to be freshly occluded especially in light of negative troponins.   LV end diastolic pressure is mildly elevated.   FINDINGS SUMMARY Progression of native CAD: Now occluded RCA stent as well as mid LAD at LIMA insertion site.  Widely patent pedicled LIMA-LAD and pedicle RIMA-LPDA with flush occlusion of sequential Left Radial - does not appear to be acute graft failure. Mildly Elevated LVEDP ~18 mmHg     RECOMMENDATIONS Post  cath hydration an hour for 8 hours.  Closely monitor renal function Titrate medical management with increasing Ranexa to 1000 mg twice daily -> reassess for symptom improvement over the next 2 to 4 weeks to determine if symptoms have improved, if not able to control symptoms, would return for staged PCI of the Left Main-ostial LCx.   Bryan Lemma, MD  Diagnostic Dominance: Left  _____________   History of Present Illness     John Dodson is a 62 y.o. male with CAD s/p CABG x 4 LIMA-distal LAD, sequential left radial-OM2-PDA, RIMA-ramus by Dr. Levada Schilling 08/25/2019, CHB s/p BiV PPM 06/2016, HTN, HLD, DM II PE in the setting of COVID and CKD stage III who was seen 04/25/2023 for the evaluation of chest pain.  Echocardiogram at the time of bypass surgery showed EF 55 to 60%, normal LV function.  Cardiac catheterization prior to bypass surgery on 08/20/2019 showed EF 55 to 65%, 60% ostial to proximal left circumflex lesion, 60% mid to distal LAD lesion, widely patent OM2 stent, 80% lateral second marginal lesion, widely patent stent in the first LPL, 50% RPAV lesion.  Myoview in June 2023 in the setting of chest pain was low risk, EF 62%.  He previously had some musculoskeletal chest pain associated with the incision/scar.  He was started on Ranexa with improvement.   Patient was in his usual state of health until around 8 PM the  night prior to admission when he started having substernal chest discomfort radiating down the left arm and also left jaw.  Syndrome occurred at rest and went away after the second nitroglycerin.  He also has a separate right substernal chest discomfort that is worse with palpation and deep inspiration.  Chest discomfort recurred the following morning after he got up to ambulate.  This prompted him to seek urgent medical attention at [facility].  Initial blood work showed creatinine of 1.71.  Serial troponin 7--> 7.  White blood cell count normal.  Platelet 123.  Chest  x-ray showed no acute finding.  EKG showed paced rhythm.  His chest pain was reminiscent of the previous angina.  He was transferred to [facility] to be evaluated for unstable angina.  Hospital Course     Unstable Angina CABG 4v (LIMA-distal LAD, sequential left radial-OM2-PDA, RIMA-ramus) -- presented with chest pain similar to prior angina. hsTn negative x2. Under went cardiac cath noted above with patent LIMA-LAD, RIMA-LPDA, but flush occlusion of seq left radial that did not appear to be acute graft failure. Occluded RCA stent as well as mLAD at LIMA insertion site.  -- recommendations to increase Ranexa to 1000mg  BID and reassess symptoms as an outpatient. If no improvement, then could consider stage PCI of the LM-oLCx -- continue plavix, statin, coreg  CHB s/p BiV PPM 2018 -- follows with Dr. Ladona Ridgel   Hypertension -- Continue carvedilol, losartan   Hyperlipidemia -- On Repatha and atorvastatin   DM II -- continue jardiance, home insulin   CKD stage III -- baseline Creatinine 1.7-1.8  Patient seen by Dr. Rosemary Holms and deemed stable for discharge home. Follow up arranged in the office.   Did the patient have an acute coronary syndrome (MI, NSTEMI, STEMI, etc) this admission?:  No                               Did the patient have a percutaneous coronary intervention (stent / angioplasty)?:  No.    _____________  Discharge Vitals Blood pressure 136/65, pulse 79, temperature 98.8 F (37.1 C), temperature source Oral, resp. rate 18, height 5\' 8"  (1.727 m), weight 88.6 kg, SpO2 100%.  Filed Weights   04/25/23 1300 04/27/23 0611 04/28/23 0700  Weight: 91.2 kg 87.8 kg 88.6 kg    Labs & Radiologic Studies    CBC Recent Labs    04/27/23 0221 04/28/23 0245  WBC 6.5 7.3  HGB 16.1 16.1  HCT 48.9 49.1  MCV 92.1 90.9  PLT 100* 102*   Basic Metabolic Panel Recent Labs    40/10/27 1018 04/28/23 0245  NA 137 139  K 5.0 4.4  CL 101 106  CO2 27 21*  GLUCOSE  226* 200*  BUN 27* 32*  CREATININE 1.79* 1.85*  CALCIUM 9.8 9.5   Liver Function Tests No results for input(s): "AST", "ALT", "ALKPHOS", "BILITOT", "PROT", "ALBUMIN" in the last 72 hours. No results for input(s): "LIPASE", "AMYLASE" in the last 72 hours. High Sensitivity Troponin:   Recent Labs  Lab 04/25/23 1250 04/25/23 1446  TROPONINIHS 7 7    BNP Invalid input(s): "POCBNP" D-Dimer No results for input(s): "DDIMER" in the last 72 hours. Hemoglobin A1C No results for input(s): "HGBA1C" in the last 72 hours. Fasting Lipid Panel Recent Labs    04/26/23 0231  CHOL 77  HDL 39*  LDLCALC NEG 4  TRIG 208*  CHOLHDL 2.0  Thyroid Function Tests No results for input(s): "TSH", "T4TOTAL", "T3FREE", "THYROIDAB" in the last 72 hours.  Invalid input(s): "FREET3" _____________  CARDIAC CATHETERIZATION Addendum Date: 04/28/2023   Ost LAD to Mid LAD lesion is 10% stenosed. Mid LAD to Dist LAD lesion is 100% stenosed.   Ost Cx to Prox Cx lesion is 65% stenosed.->  Previously evaluated prior to CABG as FFR positive   LPAV lesion is 50% stenosed.   Lat 2nd Mrg lesion is 80% stenosed.   Previously placed LPDA stent is 5% stenosed.   Ost RCA to Mid RCA STENT is 100% stenosed.   Previously placed 2nd Mrg stent of unknown type is  widely patent.   Previously placed 1st LPL stent of unknown type is  widely patent.   GRAFTS.   RIMA-PDA graft was visualized by angiography and is moderate in size. The graft exhibits no disease.   LIMA-LAD graft was visualized by angiography and is normal in caliber. The graft exhibits no disease.   Seq LRAD-OM1*LPL1(OM2) graft was injected, but not not visualized due to occlusion. Origin to Prox Graft lesion before Lat 2nd Mrg is 100% stenosed. ->  Does not appear to to be freshly occluded especially in light of negative troponins.   LV end diastolic pressure is mildly elevated. FINDINGS SUMMARY Progression of native CAD: Now occluded RCA stent as well as mid LAD at LIMA  insertion site. Widely patent pedicled LIMA-LAD and pedicle RIMA-LPDA with flush occlusion of sequential Left Radial - does not appear to be acute graft failure. Mildly Elevated LVEDP ~18 mmHg RECOMMENDATIONS Post cath hydration an hour for 8 hours.  Closely monitor renal function Titrate medical management with increasing Ranexa to 1000 mg twice daily -> reassess for symptom improvement over the next 2 to 4 weeks to determine if symptoms have improved, if not able to control symptoms, would return for staged PCI of the Left Main-ostial LCx. Bryan Lemma, MD   Result Date: 04/28/2023   Suezanne Jacquet LAD to Mid LAD lesion is 10% stenosed. Mid LAD to Dist LAD lesion is 100% stenosed.   Ost Cx to Prox Cx lesion is 65% stenosed.->  Previously evaluated prior to CABG as FFR positive   LPAV lesion is 50% stenosed.   Lat 2nd Mrg lesion is 80% stenosed.   Previously placed LPDA stent is 5% stenosed.   Ost RCA to Mid RCA STENT is 100% stenosed.   Previously placed 2nd Mrg stent of unknown type is  widely patent.   Previously placed 1st LPL stent of unknown type is  widely patent.   GRAFTS.   RIMA-PDA graft was visualized by angiography and is moderate in size. The graft exhibits no disease.   LIMA-LAD graft was visualized by angiography and is normal in caliber. The graft exhibits no disease.   Seq LRAD-OM1*LPL1(OM2) graft was injected, but not not visualized due to occlusion. Origin to Prox Graft lesion before Lat 2nd Mrg is 100% stenosed. ->  Does not appear to to be freshly occluded especially in light of negative troponins.   LV end diastolic pressure is mildly elevated. FINDINGS SUMMARY Progression of native CAD: Now occluded RCA stent as well as mid LAD at LIMA insertion site. Widely patent pedicled LIMA-LAD and pedicle RIMA-LPDA with flush occlusion of sequential Left Radial - does not appear to be acute graft failure. Mildly Elevated LVEDP ~18 mmHg RECOMMENDATIONS Post cath hydration an hour for 8 hours.  Closely monitor  renal function Titrate medical management with increasing Ranexa to 1000  mg twice daily -> reassess for symptom improvement over the next 2 to 4 weeks to determine if symptoms have improved, if not able to control symptoms, would return for staged PCI of the Left Main-ostial LCx. Bryan Lemma, MD   DG Chest 2 View Result Date: 04/25/2023 CLINICAL DATA:  Chest pain. EXAM: CHEST - 2 VIEW COMPARISON:  11/23/2019. FINDINGS: Bilateral lung fields are clear. Bilateral costophrenic angles are clear. Normal cardio-mediastinal silhouette. There is a left sided 3-lead pacemaker. No acute osseous abnormalities. The soft tissues are within normal limits. IMPRESSION: *No active cardiopulmonary disease. Electronically Signed   By: Jules Schick M.D.   On: 04/25/2023 14:35   Disposition   Pt is being discharged home today in good condition.  Follow-up Plans & Appointments     Discharge Instructions     Call MD for:  difficulty breathing, headache or visual disturbances   Complete by: As directed    Call MD for:  redness, tenderness, or signs of infection (pain, swelling, redness, odor or green/yellow discharge around incision site)   Complete by: As directed    Diet - low sodium heart healthy   Complete by: As directed    Discharge instructions   Complete by: As directed    Groin Site Care Refer to this sheet in the next few weeks. These instructions provide you with information on caring for yourself after your procedure. Your caregiver may also give you more specific instructions. Your treatment has been planned according to current medical practices, but problems sometimes occur. Call your caregiver if you have any problems or questions after your procedure. HOME CARE INSTRUCTIONS You may shower 24 hours after the procedure. Remove the bandage (dressing) and gently wash the site with plain soap and water. Gently pat the site dry.  Do not apply powder or lotion to the site.  Do not sit in a  bathtub, swimming pool, or whirlpool for 5 to 7 days.  No bending, squatting, or lifting anything over 10 pounds (4.5 kg) as directed by your caregiver.  Inspect the site at least twice daily.  Do not drive home if you are discharged the same day of the procedure. Have someone else drive you.  You may drive 24 hours after the procedure unless otherwise instructed by your caregiver.  What to expect: Any bruising will usually fade within 1 to 2 weeks.  Blood that collects in the tissue (hematoma) may be painful to the touch. It should usually decrease in size and tenderness within 1 to 2 weeks.  SEEK IMMEDIATE MEDICAL CARE IF: You have unusual pain at the groin site or down the affected leg.  You have redness, warmth, swelling, or pain at the groin site.  You have drainage (other than a small amount of blood on the dressing).  You have chills.  You have a fever or persistent symptoms for more than 72 hours.  You have a fever and your symptoms suddenly get worse.  Your leg becomes pale, cool, tingly, or numb.  You have heavy bleeding from the site. Hold pressure on the site. .   Increase activity slowly   Complete by: As directed         Discharge Medications   Allergies as of 04/28/2023       Reactions   Integrilin [eptifibatide] Shortness Of Breath, Other (See Comments)   Reportedly had shortness of breath, confusion.   Latex Rash   Codeine Nausea And Vomiting   Tylenol [acetaminophen] Other (See Comments)  Tylenol -3 with codiene   Zithromax [azithromycin] Nausea And Vomiting   Contrast Media [iodinated Contrast Media] Rash, Other (See Comments)   Reportedly cardiac arrest   Glipizide Rash, Other (See Comments)   Headache        Medication List     TAKE these medications    albuterol 108 (90 Base) MCG/ACT inhaler Commonly known as: VENTOLIN HFA Inhale 2 puffs into the lungs every 4 (four) hours as needed for wheezing or shortness of breath.   allopurinol 300 MG  tablet Commonly known as: ZYLOPRIM Take 300 mg by mouth daily.   atorvastatin 80 MG tablet Commonly known as: LIPITOR TAKE 1 TABLET BY MOUTH DAILY What changed: when to take this   carvedilol 12.5 MG tablet Commonly known as: COREG Take 1 tablet (12.5 mg total) by mouth 2 (two) times daily with a meal.   CAYENNE PEPPER PO Take 1 capsule by mouth in the morning and at bedtime.   clopidogrel 75 MG tablet Commonly known as: PLAVIX TAKE 1 TABLET BY MOUTH DAILY   COLLAGEN PO Take 3 tablets by mouth in the morning and at bedtime.   Fish Oil Burp-Less 1000 MG Caps Take 2,000 mg by mouth daily.   fluticasone 50 MCG/ACT nasal spray Commonly known as: FLONASE Place 2 sprays into both nostrils as needed for congestion.   furosemide 40 MG tablet Commonly known as: LASIX Take 1 tablet (40 mg total) by mouth daily as needed for fluid or edema (Poor more than 3 pound weight gain overnight or more than 5 pound weight gain over baseline).   gabapentin 100 MG capsule Commonly known as: NEURONTIN Take 200 mg by mouth at bedtime.   Jardiance 25 MG Tabs tablet Generic drug: empagliflozin Take 25 mg by mouth daily.   Lantus SoloStar 100 UNIT/ML Solostar Pen Generic drug: insulin glargine Inject 50 Units into the skin every evening. 100UNITS/3 ML   losartan 25 MG tablet Commonly known as: COZAAR Take 12.5 mg by mouth daily.   MAGNESIUM OXIDE PO Take 500 mg by mouth at bedtime. FOR LEG CRAMPS   nitroGLYCERIN 0.4 MG SL tablet Commonly known as: NITROSTAT DISSOLVE 1 TABLET UNDER THE  TONGUE EVERY 5 MINUTES AS NEEDED FOR CHEST PAIN. MAX OF 3 TABLETS IN 15 MINUTES. CALL 911 IF PAIN  PERSISTS.   pantoprazole 40 MG tablet Commonly known as: PROTONIX Take 40 mg by mouth 2 (two) times daily.   ranolazine 1000 MG SR tablet Commonly known as: RANEXA Take 1 tablet (1,000 mg total) by mouth 2 (two) times daily. What changed:  medication strength how much to take   Repatha SureClick  140 MG/ML Soaj Generic drug: Evolocumab Inject 140 mg into the skin every 14 (fourteen) days.   Vitamin D (Cholecalciferol) 25 MCG (1000 UT) Caps Take 1,000 Units by mouth at bedtime.        Outstanding Labs/Studies   BMET at follow up appt  Duration of Discharge Encounter   Greater than 30 minutes including physician time.  Signed, Laverda Page, NP 04/28/2023, 4:44 PM

## 2023-04-29 ENCOUNTER — Encounter (HOSPITAL_COMMUNITY): Payer: Self-pay | Admitting: Cardiology

## 2023-04-29 ENCOUNTER — Telehealth: Payer: Self-pay | Admitting: Internal Medicine

## 2023-04-29 MED ORDER — FUROSEMIDE 40 MG PO TABS: 40.0000 mg | ORAL_TABLET | Freq: Every day | ORAL | 2 refills | Status: DC | PRN

## 2023-04-29 MED FILL — Verapamil HCl IV Soln 2.5 MG/ML: INTRAVENOUS | Qty: 2 | Status: AC

## 2023-04-29 NOTE — Telephone Encounter (Signed)
*  STAT* If patient is at the pharmacy, call can be transferred to refill team.   1. Which medications need to be refilled? (please list name of each medication and dose if known) Furosemide  40 mg    2. Would you like to learn more about the convenience, safety, & potential cost savings by using the [facility]?     3. Are you open to using the [facility] (Type [facility].   4. Which pharmacy/location (including street and city if local pharmacy) is medication to be sent to?[facility]  [address], [place]    5. Do they need a 30 day or 90 day supply? 90 days and refills- please call today- out of medicine

## 2023-05-03 NOTE — Progress Notes (Signed)
 Cardiology Office Note    Date:  05/05/2023  ID:  John Dodson, DOB [birth year], MRN [id number] PCP:  Delilah Murray HERO., MD  Cardiologist:  Alm Clay, MD  Electrophysiologist:  Danelle Birmingham, MD   Chief Complaint: Hospital follow up   History of Present Illness: .    John Dodson is a 63 y.o. male with visit-pertinent history of CAD s/p multiple prior stents, CABG x 4 in 07/2019 with LIMA to distal LAD, sequential left radial to OM 2 to PDA, RIMA to ramus, complete heart block s/p biventricular PPM in 06/2016, palpitations, hypertension, hyperlipidemia, PE following COVID-19 infection, CKD stage III and type 2 diabetes.  Cardiac catheterization prior to bypass surgery in 07/2019 showed EF 55 to 65%, 60% ostial to proximal left circumflex lesion, 60% mid to distal LAD lesion, widely patent OM2 stent, 80% lateral second marginal lesion, widely patent stent in the first LPL, 50% RPA V lesion.  Echocardiogram in 2021 showed EF 55 to 60%, normal LV function.  Lexiscan  in 09/2021 for chest pain was low risk, EF 62%.  At that time it was felt that his chest discomfort was related to incision/scar pain.  In January 2024 he continued to note intermittent chest discomfort, predominantly at rest though he did report some exertional symptoms.  He was started on Ranexa  500 mg twice daily with improvement.  He was last seen in clinic on 01/02/2023, he had done well from a cardiac standpoint.  He noted occasional twinges in his chest, relieved with burping felt to be related to indigestion.  On 04/25/2023 he presented to the emergency room with chest pain.  He reported having substernal chest discomfort radiating down the left arm and also left jaw.  Discomfort occurred at rest and resolved after a second nitroglycerin .  He also reported a separate right substernal chest discomfort that was worse with palpation and deep inspiration.  He had reoccurrence of chest discomfort the following morning when he  was up ambulating. Creatinine 1.71, troponin 7>7.  Checks x-ray showed no acute findings.  His chest discomfort however is reminiscent of previous angina.  On 04/28/2023 he underwent cardiac catheterization, there was noted progression of native CAD with a now occluded RCA stent as well as mid LAD at LIMA insertion site.  There is widely patent pedicled LIMA to LAD and pedicle RIMA to LDPA with flush occlusion of sequential left radial, not felt to be acute graft failure.  His Ranexa  was increased to 1000 mg twice daily with recommendation to reassess for symptom improvement over the next 2 to 4 weeks, if unable to control symptoms recommended return for staged PCI of the left main to ostial LCx.  Today he presents for follow up he reports that he is doing okay.  He notes that he has continued to have nearly daily chest discomfort, notes it is worse when he has to climb stairs.  He does have some associated shortness of breath with this. He is able to take sublingual nitroglycerin  with resolution of symptoms.  He has noted increased dizziness and feeling extremely off balance since increased dose of Ranexa , he feels that he is not tolerating this well.  He denies palpitations, lower extremity edema, orthopnea or PND.   ROS: .   Today he denies lower extremity edema, fatigue, palpitations, melena, hematuria, hemoptysis, diaphoresis, weakness, presyncope, syncope, orthopnea, and PND.  All other systems are reviewed and otherwise negative. Studies Reviewed: SABRA   EKG:  EKG is ordered today, personally  reviewed, demonstrating  EKG Interpretation Date/Time:  Monday May 05 2023 14:23:03 EST Ventricular Rate:  67 PR Interval:  150 QRS Duration:  152 QT Interval:  460 QTC Calculation: 486 R Axis:   250  Text Interpretation: Ventricular-paced rhythm Possible Left atrial enlargement Right bundle branch block Cannot rule out Anteroseptal infarct (cited on or before 05-May-2023) Confirmed by Donaldson Richter  [phone]) on 05/05/2023 4:25:19 PM   CV Studies:  Cardiac Studies & Procedures   CARDIAC CATHETERIZATION  CARDIAC CATHETERIZATION 04/28/2023  Narrative   Ost LAD to Mid LAD lesion is 10% stenosed. Mid LAD to Dist LAD lesion is 100% stenosed.   Ost Cx to Prox Cx lesion is 65% stenosed.->  Previously evaluated prior to CABG as FFR positive   LPAV lesion is 50% stenosed.   Lat 2nd Mrg lesion is 80% stenosed.   Previously placed LPDA stent is 5% stenosed.   Ost RCA to Mid RCA STENT is 100% stenosed.   Previously placed 2nd Mrg stent of unknown type is  widely patent.   Previously placed 1st LPL stent of unknown type is  widely patent.   GRAFTS.   RIMA-PDA graft was visualized by angiography and is moderate in size. The graft exhibits no disease.   LIMA-LAD graft was visualized by angiography and is normal in caliber. The graft exhibits no disease.   Seq LRAD-OM1*LPL1(OM2) graft was injected, but not not visualized due to occlusion. Origin to Prox Graft lesion before Lat 2nd Mrg is 100% stenosed. ->  Does not appear to to be freshly occluded especially in light of negative troponins.   LV end diastolic pressure is mildly elevated.  FINDINGS SUMMARY Progression of native CAD: Now occluded RCA stent as well as mid LAD at LIMA insertion site. Widely patent pedicled LIMA-LAD and pedicle RIMA-LPDA with flush occlusion of sequential Left Radial - does not appear to be acute graft failure. Mildly Elevated LVEDP ~18 mmHg   RECOMMENDATIONS Post cath hydration an hour for 8 hours.  Closely monitor renal function Titrate medical management with increasing Ranexa  to 1000 mg twice daily -> reassess for symptom improvement over the next 2 to 4 weeks to determine if symptoms have improved, if not able to control symptoms, would return for staged PCI of the Left Main-ostial LCx.    Alm Clay, MD  Findings Coronary Findings Diagnostic  Dominance: Left  Left Main Vessel is large.  Left Anterior  Descending Ost LAD to Mid LAD lesion is 10% stenosed. The lesion was previously treated using a drug eluting stent over 2 years ago. At least 2 stents Mid LAD to Dist LAD lesion is 100% stenosed. The lesion is distal to major branch, segmental, eccentric and irregular. Highly significant DFR  Third Diagonal Branch Vessel is small in size.  Left Circumflex Vessel is large. Ost Cx to Prox Cx lesion is 65% stenosed. The lesion is focal. Angiographically does not seem to be significant, however highly significant by DFR  Second Obtuse Marginal Branch Previously placed 2nd Mrg stent of unknown type is  widely patent.  Lateral Second Obtuse Marginal Branch Vessel is small in size. Considered to be a small OM by prior cath films.  Difficult to determine if it sidebranch or true OM Lat 2nd Mrg lesion is 80% stenosed. The lesion is focal. Previously described a 75 to 80%  Left Posterior Descending Artery Vessel is large in size. LPDA lesion is 5% stenosed. The lesion was previously treated using a drug eluting stent over 2 years  ago. Several overlapping stents  First Left Posterolateral Branch Vessel is large in size. Previously placed 1st LPL stent of unknown type is  widely patent.  Left Posterior Atrioventricular Artery Vessel is large in size. LPAV lesion is 50% stenosed. The lesion is located at the bend.  Right Coronary Artery Vessel was injected. Vessel is small. There is severe diffuse disease throughout the vessel. Ost RCA to Mid RCA lesion is 100% stenosed. Diffuse The lesion was previously treated using a drug eluting stent over 2 years ago. At least 2 if not 3 overlapping DES stents Previously placed stent displays restenosis.  Acute Marginal Branch Vessel is small in size.  Right Ventricular Branch Vessel is small in size.  Right Posterior Descending Artery Vessel is small in size.  RIMA RIMA Graft To LPDA RIMA graft was visualized by angiography and is moderate in  size.  The graft exhibits no disease. RIMA-PDA  LIMA LIMA Graft To Dist LAD LIMA graft was visualized by angiography and is normal in caliber.  LIMA-LAD  Sequential Left Radial Artery Graft To Lat 2nd Mrg, 2nd Mrg Left radial artery graft was not visualized due to known occlusion.  Injected - but not visualized The graft exhibits severe . SeqLRAD-OM1-LPL Origin to Prox Graft lesion before Lat 2nd Mrg  is 100% stenosed.  Intervention  No interventions have been documented.   CARDIAC CATHETERIZATION  CARDIAC CATHETERIZATION 08/20/2019  Narrative  The left ventricular systolic function is normal. The left ventricular ejection fraction is 55-65% by visual estimate. LV end diastolic pressure is normal.LV end diastolic pressure is normal.  -------------------  There is stent from ostial to almost distal small nondominant RCA: Ost RCA to Mid RCA stent is diffusely 35% stenosed.  Ost Cx to Prox Cx lesion is 60% stenosed.  Ost LAD to Mid LAD stent is 10% stenosed. Mid LAD to Dist LAD lesion is 60% stenosed.  Previously placed 2nd Mrg stent (DES) is widely patent.  Lat 2nd Mrg lesion is 80% stenosed. Previously described.  Previously placed 1st LPL stent (DES) is widely patent.  LPAV lesion is 50% stenosed.  LPDA stent is 5% stenosed.   Multivessel CAD with essentially patent stents in proximal to mid LAD, proximal OM1, proximal LPL1, several stents in the L PDA as well as RCA that is a small nondominant vessel.  No obvious lesions within stents noted.  Ostial LCx roughly 60% --> highly DFR positive with 0.84  Long mid LAD 60% after stent--highly DFR +0.67-0.73  Normal LVEDP  With significant LAD as well as ostial LCx disease--best course of action is CVTS consultation for CABG.    Alm Clay, MD  Findings Coronary Findings Diagnostic  Dominance: Left  Left Main Vessel is large.  Left Anterior Descending Ost LAD to Mid LAD lesion is 10% stenosed. The lesion was  previously treated using a drug eluting stent over 2 years ago. At least 2 stents Mid LAD to Dist LAD lesion is 60% stenosed. The lesion is distal to major branch, segmental, eccentric and irregular. The stenosis was measured by a visual reading. Pressure wire/FFR was performed on the lesion. DFR 0.67 and 0.74 Highly significant DFR  Third Diagonal Branch Vessel is small in size.  Left Circumflex Vessel is large. Ost Cx to Prox Cx lesion is 60% stenosed. The lesion is focal. Pressure wire/FFR was performed on the lesion. DFR 0.84 Angiographically does not seem to be significant, however highly significant by DFR  Second Obtuse Marginal Branch Previously placed 2nd Mrg  stent (unknown type) is widely patent.  Lateral Second Obtuse Marginal Branch Vessel is small in size. Considered to be a small OM by prior cath films.  Difficult to determine if it sidebranch or true OM Lat 2nd Mrg lesion is 80% stenosed. The lesion is focal. Previously described a 75 to 80%  Left Posterior Descending Artery Vessel is large in size. LPDA lesion is 5% stenosed. The lesion was previously treated using a drug eluting stent over 2 years ago. Several overlapping stents  First Left Posterolateral Branch Vessel is large in size. Previously placed 1st LPL stent (unknown type) is widely patent.  Left Posterior Atrioventricular Artery Vessel is large in size. LPAV lesion is 50% stenosed. The lesion is located at the bend.  Right Coronary Artery Vessel is small. Ost RCA to Mid RCA lesion is 35% stenosed. Diffuse The lesion was previously treated using a drug eluting stent between 6-12 months ago. At least 2 if not 3 overlapping DES stents Previously placed stent displays restenosis.  Acute Marginal Branch Vessel is small in size.  Right Ventricular Branch Vessel is small in size.  Right Posterior Descending Artery Vessel is small in size.  Intervention  No interventions have been documented.   STRESS  TESTS  MYOCARDIAL PERFUSION IMAGING 10/03/2021  Narrative   The study is normal. The study is low risk.   No ST deviation was noted.   LV perfusion is normal.   Left ventricular function is normal. Nuclear stress EF: 62 %. The left ventricular ejection fraction is normal (55-65%). End diastolic cavity size is normal.   Prior study available for comparison from 08/20/2018.  Low risk stress nuclear study with normal perfusion and normal left ventricular regional and global systolic function.  ECHOCARDIOGRAM  ECHOCARDIOGRAM LIMITED 08/27/2019  Narrative ECHOCARDIOGRAM LIMITED REPORT    Patient Name:   CASIN FEDERICI Date of Exam: 08/27/2019 Medical Rec #:  [id number]           Height:       68.0 in Accession #:    [id number]          Weight:       225.7 lb Date of Birth:  Mar 06, 1961          BSA:          2.152 m Patient Age:    58 years            BP:           104/82 mmHg Patient Gender: M                   HR:           92 bpm. Exam Location:  Inpatient  Procedure: Limited Echo  STAT ECHO  Indications:    Tamponade 423.3  History:        Patient has prior history of Echocardiogram examinations, most recent 08/25/2019. CAD, Prior CABG, Signs/Symptoms:Chest Pain; Risk Factors:Hypertension, Diabetes, Dyslipidemia and Non-Smoker. Pulmonary embolism.  Sonographer:    Recardo Hedge RDCS Referring Phys: [id number] [facility] [place] Z ATKINS   Sonographer Comments: Technically difficult study due to poor echo windows. IMPRESSIONS   1. No significant pericardial effusion noted. Likely prominent prominent epicardial fat pad. Given limited views - EF appears normal RV is normal size.  FINDINGS Pericardium: No significant pericardial effusion noted. Likely prominent prominent epicardial fat pad. Given limited views - EF appears normal RV is normal size.  Maude Emmer MD Electronically signed by Maude Emmer MD Signature  Date/Time: 08/27/2019/8:15:20 AM    Final  TEE  ECHO  INTRAOPERATIVE TEE 08/25/2019  Narrative *INTRAOPERATIVE TRANSESOPHAGEAL REPORT *    Patient Name:   BAIRD POLINSKI Date of Exam: 08/25/2019 Medical Rec #:  [id number]           Height:       68.0 in Accession #:    [id number]          Weight:       208.2 lb Date of Birth:  07-Feb-1961          BSA:          2.08 m Patient Age:    58 years            BP:           134/75 mmHg Patient Gender: M                   HR:           77 bpm. Exam Location:  Anesthesiology  Transesophogeal exam was perform intraoperatively during surgical procedure. Patient was closely monitored under general anesthesia during the entirety of examination.  Indications:     CABG Sonographer:     Elinor Fresh RDCS (AE) Performing Phys: [id number] [facility] Z ATKINS  Complications: No known complications during this procedure. POST-OP IMPRESSIONS - Left Ventricle: The left ventricle is unchanged from pre-bypass. - Right Ventricle: The right ventricle appears unchanged from pre-bypass. - Aorta: The aorta appears unchanged from pre-bypass. - Left Atrium: The left atrium appears unchanged from pre-bypass. - Left Atrial Appendage: The left atrial appendage appears unchanged from pre-bypass. - Aortic Valve: The aortic valve appears unchanged from pre-bypass. - Mitral Valve: The mitral valve appears unchanged from pre-bypass. - Tricuspid Valve: There is mild regurgitation. - Interatrial Septum: The interatrial septum appears unchanged from pre-bypass. - Interventricular Septum: The interventricular septum appears unchanged from pre-bypass. - Pericardium: The pericardium appears unchanged from pre-bypass.  PRE-OP FINDINGS Left Ventricle: The left ventricle has normal systolic function, with an ejection fraction of 55-60%. The cavity size was normal. There is mild concentric left ventricular hypertrophy.  Right Ventricle: The right ventricle has normal systolic function. The cavity was normal. There is no increase  in right ventricular wall thickness.  Left Atrium: Left atrial size was normal in size.  Right Atrium: Right atrial size was normal in size. Right atrial pressure is estimated at 10 mmHg.  Interatrial Septum: No atrial level shunt detected by color flow Doppler.  Pericardium: There is no evidence of pericardial effusion.  Mitral Valve: The mitral valve is normal in structure. Mitral valve regurgitation is trivial by color flow Doppler.  Tricuspid Valve: The tricuspid valve was normal in structure. Tricuspid valve regurgitation is trivial by color flow Doppler.  Aortic Valve: The aortic valve is normal in structure. Aortic valve regurgitation was not visualized by color flow Doppler. There is no stenosis of the aortic valve, with a calculated valve area of 1.70 cm.  Pulmonic Valve: The pulmonic valve was normal in structure. Pulmonic valve regurgitation is not visualized by color flow Doppler.   Aorta: The aortic root, ascending aorta and aortic arch are normal in size and structure. There is evidence of plaque in the descending aorta.  +--------------+--------++ LEFT VENTRICLE         +--------------+--------++ PLAX 2D                +--------------+--------++ LVOT diam:    1.90 cm  +--------------+--------++ LVOT  Area:    2.84 cm +--------------+--------++                        +--------------+--------++  +------------------+------------++ AORTIC VALVE                   +------------------+------------++ AV Area (Vmax):   1.58 cm     +------------------+------------++ AV Area (Vmean):  1.59 cm     +------------------+------------++ AV Area (VTI):    1.70 cm     +------------------+------------++ AV Vmax:          147.00 cm/s  +------------------+------------++ AV Vmean:         101.000 cm/s +------------------+------------++ AV VTI:           0.286 m      +------------------+------------++ AV Peak Grad:     8.6  mmHg     +------------------+------------++ AV Mean Grad:     5.0 mmHg     +------------------+------------++ LVOT Vmax:        82.00 cm/s   +------------------+------------++ LVOT Vmean:       56.800 cm/s  +------------------+------------++ LVOT VTI:         0.171 m      +------------------+------------++ LVOT/AV VTI ratio:0.60         +------------------+------------++   +--------------+-------+ SHUNTS                +--------------+-------+ Systemic VTI: 0.17 m  +--------------+-------+ Systemic Diam:1.90 cm +--------------+-------+   Lamar Needle MD Electronically signed by Lamar Needle MD Signature Date/Time: 08/26/2019/9:42:49 PM    Final  MONITORS  LONG TERM MONITOR (3-14 DAYS) 06/13/2021  Narrative  Underlying predominant rhythm is Sinus Rhythm with an average rate of 84 bpm and a rate range of 60 to 118 bpm  Very rare PACs and PVCs noted.  There are also noted appropriate pacing as well as occasional episodes where there is pacing with intrinsic beats also noted.   Patch Wear Time:  14 days and 0 hours (2023-01-23T07:23:41-0500 to 2023-02-06T07:23:45-0500)   Overall, pretty normal monitor.  Will need EP to assess pacemaker to see if there is any true arrhythmias.  Alm Clay, MD           Current Reported Medications:.    Current Meds  Medication Sig   albuterol  (VENTOLIN  HFA) 108 (90 Base) MCG/ACT inhaler Inhale 2 puffs into the lungs every 4 (four) hours as needed for wheezing or shortness of breath.   allopurinol (ZYLOPRIM) 300 MG tablet Take 300 mg by mouth daily.   amLODipine  (NORVASC ) 2.5 MG tablet Take 1 tablet (2.5 mg total) by mouth daily.   atorvastatin  (LIPITOR ) 80 MG tablet TAKE 1 TABLET BY MOUTH DAILY (Patient taking differently: Take 80 mg by mouth every evening.)   Capsicum, Cayenne, (CAYENNE PEPPER PO) Take 1 capsule by mouth in the morning and at bedtime.   carvedilol  (COREG ) 12.5 MG tablet  Take 1 tablet (12.5 mg total) by mouth 2 (two) times daily with a meal.   clopidogrel  (PLAVIX ) 75 MG tablet TAKE 1 TABLET BY MOUTH DAILY   COLLAGEN PO Take 3 tablets by mouth in the morning and at bedtime.   Evolocumab  (REPATHA  SURECLICK) 140 MG/ML SOAJ Inject 140 mg into the skin every 14 (fourteen) days.   fluticasone (FLONASE) 50 MCG/ACT nasal spray Place 2 sprays into both nostrils as needed for congestion.   furosemide  (LASIX ) 40 MG tablet Take 1 tablet (40 mg total) by mouth daily as needed for fluid or  edema (Poor more than 3 pound weight gain overnight or more than 5 pound weight gain over baseline).   gabapentin  (NEURONTIN ) 100 MG capsule Take 200 mg by mouth at bedtime.   insulin  glargine (LANTUS  SOLOSTAR) 100 UNIT/ML Solostar Pen Inject 50 Units into the skin every evening. 100UNITS/3 ML   JARDIANCE  25 MG TABS tablet Take 25 mg by mouth daily.   losartan  (COZAAR ) 25 MG tablet Take 12.5 mg by mouth daily.   MAGNESIUM  OXIDE PO Take 500 mg by mouth at bedtime. FOR LEG CRAMPS   nitroGLYCERIN  (NITROSTAT ) 0.4 MG SL tablet DISSOLVE 1 TABLET UNDER THE  TONGUE EVERY 5 MINUTES AS NEEDED FOR CHEST PAIN. MAX OF 3 TABLETS IN 15 MINUTES. CALL 911 IF PAIN  PERSISTS.   Omega-3 Fatty Acids (FISH OIL BURP-LESS) 1000 MG CAPS Take 2,000 mg by mouth daily.   OZEMPIC, 0.25 OR 0.5 MG/DOSE, 2 MG/3ML SOPN Inject 1 mg into the skin once a week.   pantoprazole  (PROTONIX ) 40 MG tablet Take 40 mg by mouth 2 (two) times daily.   ranolazine  (RANEXA ) 500 MG 12 hr tablet Take 1 tablet (500 mg total) by mouth 2 (two) times daily.   Vitamin D, Cholecalciferol , 25 MCG (1000 UT) CAPS Take 1,000 Units by mouth at bedtime.   [DISCONTINUED] ranolazine  (RANEXA ) 1000 MG SR tablet Take 1 tablet (1,000 mg total) by mouth 2 (two) times daily.   Physical Exam:    VS:  BP 128/64 (BP Location: Left Arm, Patient Position: Sitting, Cuff Size: Normal)   Pulse 68   Resp 16   Ht 5' 8 (1.727 m)   Wt 194 lb 6.4 oz (88.2 kg)   SpO2  97%   BMI 29.56 kg/m    Wt Readings from Last 3 Encounters:  05/05/23 194 lb 6.4 oz (88.2 kg)  04/28/23 195 lb 5.2 oz (88.6 kg)  01/02/23 198 lb 3.2 oz (89.9 kg)    GEN: Well nourished, well developed in no acute distress NECK: No JVD; No carotid bruits CARDIAC: RRR, no murmurs, rubs, gallops. Femoral cath site clean and intact without evidence of hematoma RESPIRATORY:  Clear to auscultation without rales, wheezing or rhonchi  ABDOMEN: Soft, non-tender, non-distended EXTREMITIES:  No edema; No acute deformity   Asessement and Plan:.    CAD: s/p CABGx4 with Lima to distal LAD, sequential left raidal-OM2-PDA, RIMA-ramus. Did not tolerate Imdur  in the past for severe headache. Presented 12/27 with chest pain similar to prior angina, high-sensitivity troponin negative x 2.  Cardiac cath on 12/30 noted as above with patent LIMA to LAD, RIMA to LPDA flush occlusion of sequential left radial that did not appear to be acute graft failure.  Occluded RCA stent as well as an LAD at LIMA insertion site.  Ranexa  increased to 1000 mg twice daily with recommendation of if no improvement could consider staged PCI of the LM to oLCx. Today he reports continued intermittent chest discomfort, he has not noted significant improvement since starting on increased dose of Ranexa , notes increased dizziness and feeling off balance, he requests he decrease back to 500 mg twice daily.  He has been requiring sublingual nitroglycerin  nearly daily for chest discomfort. He deferred trialing Imdur  given history of headaches.  Discussed with Dr. Pietro, DOD at [facility] today, recommended decreasing Ranexa  back to 500 mg twice daily and initiating amlodipine  2.5 mg daily.  Also discussed with Dr. Anner, patient's primary cardiologist, will trial medication changes for 1 week.  If no improvement in symptoms we will  plan for staged PCI.  Reviewed ED precautions with patient. Continue atorvastatin  80 mg daily, carvedilol  12.5 mg  twice daily, Plavix  75 mg daily, Repatha , Lasix  40 mg daily as needed for weight gain, losartan  12.5 mg daily, Ranexa  500 mg twice daily and sublingual nitroglycerin  as needed for chest pain. Start amlodipine  2.5 mg daily. Check CBC and BMET.   CHB s/p Biv PPM in 2018: Stable on last interrogation on 03/15/2023.  Followed by EP.  Hypertension: Blood pressure today 128/64. Continue current antihypertensive regimen. Start amlodipine  2.5 mg daily.   Hyperlipidemia: Last lipid profile on 04/10/2023 indicated total cholesterol 87, HDL 40, triglycerides 135 and LDL 24. Continue atorvastatin  and repatha .   Type II DM: Last hemoglobin A1c 6.6 on 02/11/2023. Monitored and managed per PCP. On Jardiance  and Lantus .   CKD stage III: Last creatinine 1.85 on 04/28/23. Followed by nephrology. Check BMET.   Disposition: F/u with Ciin Brazzel, NP in one week.   Signed, Kiel Cockerell D Betrice Wanat, NP

## 2023-05-05 ENCOUNTER — Encounter: Payer: Self-pay | Admitting: Cardiology

## 2023-05-05 ENCOUNTER — Ambulatory Visit: Payer: Medicare Other | Attending: Cardiology | Admitting: Cardiology

## 2023-05-05 VITALS — BP 128/64 | HR 68 | Resp 16 | Ht 68.0 in | Wt 194.4 lb

## 2023-05-05 DIAGNOSIS — E785 Hyperlipidemia, unspecified: Secondary | ICD-10-CM

## 2023-05-05 DIAGNOSIS — E1169 Type 2 diabetes mellitus with other specified complication: Secondary | ICD-10-CM

## 2023-05-05 DIAGNOSIS — I442 Atrioventricular block, complete: Secondary | ICD-10-CM

## 2023-05-05 DIAGNOSIS — I10 Essential (primary) hypertension: Secondary | ICD-10-CM | POA: Diagnosis not present

## 2023-05-05 DIAGNOSIS — I25119 Atherosclerotic heart disease of native coronary artery with unspecified angina pectoris: Secondary | ICD-10-CM

## 2023-05-05 DIAGNOSIS — Z79899 Other long term (current) drug therapy: Secondary | ICD-10-CM

## 2023-05-05 DIAGNOSIS — Z950 Presence of cardiac pacemaker: Secondary | ICD-10-CM

## 2023-05-05 DIAGNOSIS — I2510 Atherosclerotic heart disease of native coronary artery without angina pectoris: Secondary | ICD-10-CM

## 2023-05-05 DIAGNOSIS — N183 Chronic kidney disease, stage 3 unspecified: Secondary | ICD-10-CM

## 2023-05-05 DIAGNOSIS — Z951 Presence of aortocoronary bypass graft: Secondary | ICD-10-CM | POA: Diagnosis not present

## 2023-05-05 MED ORDER — RANOLAZINE ER 500 MG PO TB12: 500.0000 mg | ORAL_TABLET | Freq: Two times a day (BID) | ORAL | 3 refills | Status: DC

## 2023-05-05 MED ORDER — AMLODIPINE BESYLATE 2.5 MG PO TABS: 2.5000 mg | ORAL_TABLET | Freq: Every day | ORAL | 3 refills | Status: DC

## 2023-05-05 NOTE — Patient Instructions (Addendum)
 Medication Instructions:  Your physician has recommended you make the following change in your medication:  DECREASE: Ranexa  500 mg (one tablet) twice daily START: Amlodipine  2.5 mg (one tablet)  once daily  *If you need a refill on your cardiac medications before your next appointment, please call your pharmacy*   Lab Work: BMET, CBC If you have labs (blood work) drawn today and your tests are completely normal, you will receive your results only by: MyChart Message (if you have MyChart) OR A paper copy in the mail If you have any lab test that is abnormal or we need to change your treatment, we will call you to review the results.   Follow-Up: At [facility], you and your health needs are our priority.  As part of our continuing mission to provide you with exceptional heart care, we have created designated Provider Care Teams.  These Care Teams include your primary Cardiologist (physician) and Advanced Practice Providers (APPs -  Physician Assistants and Nurse Practitioners) who all work together to provide you with the care you need, when you need it.   Your next appointment:   1 week(s)  Provider:   Katlyn West

## 2023-05-06 ENCOUNTER — Telehealth: Payer: Self-pay

## 2023-05-06 LAB — CBC
Hematocrit: 49.3 % (ref 37.5–51.0)
Hemoglobin: 16.3 g/dL (ref 13.0–17.7)
MCH: 30.3 pg (ref 26.6–33.0)
MCHC: 33.1 g/dL (ref 31.5–35.7)
MCV: 92 fL (ref 79–97)
Platelets: 115 10*3/uL — ABNORMAL LOW (ref 150–450)
RBC: 5.38 x10E6/uL (ref 4.14–5.80)
RDW: 12.8 % (ref 11.6–15.4)
WBC: 7.4 10*3/uL (ref 3.4–10.8)

## 2023-05-06 LAB — BASIC METABOLIC PANEL
BUN/Creatinine Ratio: 16 (ref 10–24)
BUN: 31 mg/dL — ABNORMAL HIGH (ref 8–27)
CO2: 23 mmol/L (ref 20–29)
Calcium: 9.7 mg/dL (ref 8.6–10.2)
Chloride: 97 mmol/L (ref 96–106)
Creatinine, Ser: 1.91 mg/dL — ABNORMAL HIGH (ref 0.76–1.27)
Glucose: 297 mg/dL — ABNORMAL HIGH (ref 70–99)
Potassium: 5.3 mmol/L — ABNORMAL HIGH (ref 3.5–5.2)
Sodium: 137 mmol/L (ref 134–144)
eGFR: 39 mL/min/{1.73_m2} — ABNORMAL LOW (ref >59)

## 2023-05-06 NOTE — Telephone Encounter (Signed)
-----   Message from Katlyn D [place] sent at 05/06/2023  9:14 AM EST ----- Please let John Dodson know that his CBC shows no evidence of anemia or infection. His kidney function is slightly decreased from baseline. His potassium is slightly increased at 5.3. Recommend he hold his Losartan  until follow up next week and we will recheck his labs then. Lets increase his amlodipine  to 5 mg daily, he should monitor his blood pressure at home. Encourage staying hydrated with water  and to not take any potassium supplements or drink electrolyte beverages. He should reduce his intake of sources of potassium including bananas, squash, yogurt, white beans, sweet potatoes, leafy greens, and avocados.

## 2023-05-06 NOTE — Telephone Encounter (Signed)
 Called patient advised of below they verbalized understanding.

## 2023-05-11 NOTE — Progress Notes (Signed)
 Cardiology Office Note    Date:  05/13/2023  ID:  ELTON HEID, DOB 1960-11-14, MRN [id number] PCP:  Delilah Murray HERO., MD  Cardiologist:  Alm Clay, MD  Electrophysiologist:  Danelle Birmingham, MD   Chief Complaint: Chest pain   History of Present Illness: .    John Dodson is a 63 y.o. male with visit-pertinent history of CAD s/p multiple prior stents, CABG x 4 in 07/2019 with LIMA to distal LAD, sequential left radial to OM 2 to PDA, RIMA to ramus, complete heart block s/p biventricular PPM in 06/2016, palpitations, hypertension, hyperlipidemia, PE following COVID-19 infection, CKD stage III and type 2 diabetes.   Cardiac catheterization prior to bypass surgery in 07/2019 showed EF 55 to 65%, 60% ostial to proximal left circumflex lesion, 60% mid to distal LAD lesion, widely patent OM2 stent, 80% lateral second marginal lesion, widely patent stent in the first LPL, 50% RPA V lesion.  Echocardiogram in 2021 showed EF 55 to 60%, normal LV function.  Lexiscan  in 09/2021 for chest pain was low risk, EF 62%.  At that time it was felt that his chest discomfort was related to incision/scar pain.  In January 2024 he continued to note intermittent chest discomfort, predominantly at rest though he did report some exertional symptoms.  He was started on Ranexa  500 mg twice daily with improvement.   He was last seen in clinic on 01/02/2023, he had done well from a cardiac standpoint.  He noted occasional twinges in his chest, relieved with burping felt to be related to indigestion.   On 04/25/2023 he presented to the emergency room with chest pain.  He reported having substernal chest discomfort radiating down the left arm and also left jaw.  Discomfort occurred at rest and resolved after a second nitroglycerin .  He also reported a separate right substernal chest discomfort that was worse with palpation and deep inspiration.  He had reoccurrence of chest discomfort the following morning when he was  up ambulating. Creatinine 1.71, troponin 7>7.  Checks x-ray showed no acute findings.  His chest discomfort however is reminiscent of previous angina.  On 04/28/2023 he underwent cardiac catheterization, there was noted progression of native CAD with a now occluded RCA stent as well as mid LAD at LIMA insertion site.  There is widely patent pedicled LIMA to LAD and pedicle RIMA to LDPA with flush occlusion of sequential left radial, not felt to be acute graft failure.  His Ranexa  was increased to 1000 mg twice daily with recommendation to reassess for symptom improvement over the next 2 to 4 weeks, if unable to control symptoms recommended return for staged PCI of the left main to ostial LCx.  At office visit on 05/05/2023 he reported that he continued to have nearly daily chest discomfort, worsened by exertion.  He is able to take sublingual nitroglycerin  with improvement in symptoms.  He was unable to tolerate increased dose of Ranexa  due to dizziness and lightheadedness, his Ranexa  was decreased back to 500 mg twice daily and he was started on amlodipine  5 mg daily.  His losartan  was held for hyperkalemia.  Discussed with Dr. Clay, if no improvement in symptoms we will plan for staged PCI.  Today he presents for follow-up.  He reports that he feels as though he is not getting any better, he continues to have chest discomfort almost daily. He can rest with improvement, he has required sublingual nitroglycerin  as well. He does report some slight shortness of breath.  He denies any palpitations or lower extremity edema. Dizziness and lightheadedness improved with decreased Ranexa . He reports that he is blood pressure has been well controlled at home.    Labwork independently reviewed: 05/05/2023: Sodium 137, potassium 5.3, creatinine 1.91, EGFR 39 ROS: .   Today he denies lower extremity edema, fatigue, palpitations, melena, hematuria, hemoptysis, diaphoresis, weakness, presyncope, syncope, orthopnea, and  PND.  All other systems are reviewed and otherwise negative. Studies Reviewed: SABRA   EKG:  EKG is ordered today, personally reviewed, demonstrating  EKG Interpretation Date/Time:  Tuesday May 13 2023 13:41:27 EST Ventricular Rate:  60 PR Interval:  168 QRS Duration:  166 QT Interval:  478 QTC Calculation: 478 R Axis:   249  Text Interpretation: Atrial-paced rhythm Right bundle branch block Confirmed by Loie Jahr [phone]) on 05/13/2023 2:26:01 PM   CV Studies:  Cardiac Studies & Procedures   CARDIAC CATHETERIZATION  CARDIAC CATHETERIZATION 04/28/2023  Narrative   Ost LAD to Mid LAD lesion is 10% stenosed. Mid LAD to Dist LAD lesion is 100% stenosed.   Ost Cx to Prox Cx lesion is 65% stenosed.->  Previously evaluated prior to CABG as FFR positive   LPAV lesion is 50% stenosed.   Lat 2nd Mrg lesion is 80% stenosed.   Previously placed LPDA stent is 5% stenosed.   Ost RCA to Mid RCA STENT is 100% stenosed.   Previously placed 2nd Mrg stent of unknown type is  widely patent.   Previously placed 1st LPL stent of unknown type is  widely patent.   GRAFTS.   RIMA-PDA graft was visualized by angiography and is moderate in size. The graft exhibits no disease.   LIMA-LAD graft was visualized by angiography and is normal in caliber. The graft exhibits no disease.   Seq LRAD-OM1*LPL1(OM2) graft was injected, but not not visualized due to occlusion. Origin to Prox Graft lesion before Lat 2nd Mrg is 100% stenosed. ->  Does not appear to to be freshly occluded especially in light of negative troponins.   LV end diastolic pressure is mildly elevated.  FINDINGS SUMMARY Progression of native CAD: Now occluded RCA stent as well as mid LAD at LIMA insertion site. Widely patent pedicled LIMA-LAD and pedicle RIMA-LPDA with flush occlusion of sequential Left Radial - does not appear to be acute graft failure. Mildly Elevated LVEDP ~18 mmHg   RECOMMENDATIONS Post cath hydration an hour for 8 hours.   Closely monitor renal function Titrate medical management with increasing Ranexa  to 1000 mg twice daily -> reassess for symptom improvement over the next 2 to 4 weeks to determine if symptoms have improved, if not able to control symptoms, would return for staged PCI of the Left Main-ostial LCx.    Alm Clay, MD  Findings Coronary Findings Diagnostic  Dominance: Left  Left Main Vessel is large.  Left Anterior Descending Ost LAD to Mid LAD lesion is 10% stenosed. The lesion was previously treated using a drug eluting stent over 2 years ago. At least 2 stents Mid LAD to Dist LAD lesion is 100% stenosed. The lesion is distal to major branch, segmental, eccentric and irregular. Highly significant DFR  Third Diagonal Branch Vessel is small in size.  Left Circumflex Vessel is large. Ost Cx to Prox Cx lesion is 65% stenosed. The lesion is focal. Angiographically does not seem to be significant, however highly significant by DFR  Second Obtuse Marginal Branch Previously placed 2nd Mrg stent of unknown type is  widely patent.  Lateral Second Obtuse  Marginal Branch Vessel is small in size. Considered to be a small OM by prior cath films.  Difficult to determine if it sidebranch or true OM Lat 2nd Mrg lesion is 80% stenosed. The lesion is focal. Previously described a 75 to 80%  Left Posterior Descending Artery Vessel is large in size. LPDA lesion is 5% stenosed. The lesion was previously treated using a drug eluting stent over 2 years ago. Several overlapping stents  First Left Posterolateral Branch Vessel is large in size. Previously placed 1st LPL stent of unknown type is  widely patent.  Left Posterior Atrioventricular Artery Vessel is large in size. LPAV lesion is 50% stenosed. The lesion is located at the bend.  Right Coronary Artery Vessel was injected. Vessel is small. There is severe diffuse disease throughout the vessel. Ost RCA to Mid RCA lesion is 100% stenosed.  Diffuse The lesion was previously treated using a drug eluting stent over 2 years ago. At least 2 if not 3 overlapping DES stents Previously placed stent displays restenosis.  Acute Marginal Branch Vessel is small in size.  Right Ventricular Branch Vessel is small in size.  Right Posterior Descending Artery Vessel is small in size.  RIMA RIMA Graft To LPDA RIMA graft was visualized by angiography and is moderate in size.  The graft exhibits no disease. RIMA-PDA  LIMA LIMA Graft To Dist LAD LIMA graft was visualized by angiography and is normal in caliber.  LIMA-LAD  Sequential Left Radial Artery Graft To Lat 2nd Mrg, 2nd Mrg Left radial artery graft was not visualized due to known occlusion.  Injected - but not visualized The graft exhibits severe . SeqLRAD-OM1-LPL Origin to Prox Graft lesion before Lat 2nd Mrg  is 100% stenosed.  Intervention  No interventions have been documented.   CARDIAC CATHETERIZATION  CARDIAC CATHETERIZATION 08/20/2019  Narrative  The left ventricular systolic function is normal. The left ventricular ejection fraction is 55-65% by visual estimate. LV end diastolic pressure is normal.LV end diastolic pressure is normal.  -------------------  There is stent from ostial to almost distal small nondominant RCA: Ost RCA to Mid RCA stent is diffusely 35% stenosed.  Ost Cx to Prox Cx lesion is 60% stenosed.  Ost LAD to Mid LAD stent is 10% stenosed. Mid LAD to Dist LAD lesion is 60% stenosed.  Previously placed 2nd Mrg stent (DES) is widely patent.  Lat 2nd Mrg lesion is 80% stenosed. Previously described.  Previously placed 1st LPL stent (DES) is widely patent.  LPAV lesion is 50% stenosed.  LPDA stent is 5% stenosed.   Multivessel CAD with essentially patent stents in proximal to mid LAD, proximal OM1, proximal LPL1, several stents in the L PDA as well as RCA that is a small nondominant vessel.  No obvious lesions within stents noted.  Ostial  LCx roughly 60% --> highly DFR positive with 0.84  Long mid LAD 60% after stent--highly DFR +0.67-0.73  Normal LVEDP  With significant LAD as well as ostial LCx disease--best course of action is CVTS consultation for CABG.    Alm Clay, MD  Findings Coronary Findings Diagnostic  Dominance: Left  Left Main Vessel is large.  Left Anterior Descending Ost LAD to Mid LAD lesion is 10% stenosed. The lesion was previously treated using a drug eluting stent over 2 years ago. At least 2 stents Mid LAD to Dist LAD lesion is 60% stenosed. The lesion is distal to major branch, segmental, eccentric and irregular. The stenosis was measured by a visual reading.  Pressure wire/FFR was performed on the lesion. DFR 0.67 and 0.74 Highly significant DFR  Third Diagonal Branch Vessel is small in size.  Left Circumflex Vessel is large. Ost Cx to Prox Cx lesion is 60% stenosed. The lesion is focal. Pressure wire/FFR was performed on the lesion. DFR 0.84 Angiographically does not seem to be significant, however highly significant by DFR  Second Obtuse Marginal Branch Previously placed 2nd Mrg stent (unknown type) is widely patent.  Lateral Second Obtuse Marginal Branch Vessel is small in size. Considered to be a small OM by prior cath films.  Difficult to determine if it sidebranch or true OM Lat 2nd Mrg lesion is 80% stenosed. The lesion is focal. Previously described a 75 to 80%  Left Posterior Descending Artery Vessel is large in size. LPDA lesion is 5% stenosed. The lesion was previously treated using a drug eluting stent over 2 years ago. Several overlapping stents  First Left Posterolateral Branch Vessel is large in size. Previously placed 1st LPL stent (unknown type) is widely patent.  Left Posterior Atrioventricular Artery Vessel is large in size. LPAV lesion is 50% stenosed. The lesion is located at the bend.  Right Coronary Artery Vessel is small. Ost RCA to Mid RCA lesion is  35% stenosed. Diffuse The lesion was previously treated using a drug eluting stent between 6-12 months ago. At least 2 if not 3 overlapping DES stents Previously placed stent displays restenosis.  Acute Marginal Branch Vessel is small in size.  Right Ventricular Branch Vessel is small in size.  Right Posterior Descending Artery Vessel is small in size.  Intervention  No interventions have been documented.   STRESS TESTS  MYOCARDIAL PERFUSION IMAGING 10/03/2021  Narrative   The study is normal. The study is low risk.   No ST deviation was noted.   LV perfusion is normal.   Left ventricular function is normal. Nuclear stress EF: 62 %. The left ventricular ejection fraction is normal (55-65%). End diastolic cavity size is normal.   Prior study available for comparison from 08/20/2018.  Low risk stress nuclear study with normal perfusion and normal left ventricular regional and global systolic function.  ECHOCARDIOGRAM  ECHOCARDIOGRAM LIMITED 08/27/2019  Narrative ECHOCARDIOGRAM LIMITED REPORT    Patient Name:   John Dodson Date of Exam: 08/27/2019 Medical Rec #:  [id number]           Height:       68.0 in Accession #:    [id number]          Weight:       225.7 lb Date of Birth:  1960/06/10          BSA:          2.152 m Patient Age:    58 years            BP:           104/82 mmHg Patient Gender: M                   HR:           92 bpm. Exam Location:  Inpatient  Procedure: Limited Echo  STAT ECHO  Indications:    Tamponade 423.3  History:        Patient has prior history of Echocardiogram examinations, most recent 08/25/2019. CAD, Prior CABG, Signs/Symptoms:Chest Pain; Risk Factors:Hypertension, Diabetes, Dyslipidemia and Non-Smoker. Pulmonary embolism.  Sonographer:    Recardo Hedge RDCS Referring Phys: [id number] [facility] Z ATKINS  Sonographer Comments: Technically difficult study due to poor echo windows. IMPRESSIONS   1. No significant pericardial  effusion noted. Likely prominent prominent epicardial fat pad. Given limited views - EF appears normal RV is normal size.  FINDINGS Pericardium: No significant pericardial effusion noted. Likely prominent prominent epicardial fat pad. Given limited views - EF appears normal RV is normal size.  Maude Emmer MD Electronically signed by Maude Emmer MD Signature Date/Time: 08/27/2019/8:15:20 AM    Final  TEE  ECHO INTRAOPERATIVE TEE 08/25/2019  Narrative *INTRAOPERATIVE TRANSESOPHAGEAL REPORT *    Patient Name:   KINLEY FERRENTINO Date of Exam: 08/25/2019 Medical Rec #:  [id number]           Height:       68.0 in Accession #:    [id number]          Weight:       208.2 lb Date of Birth:  Nov 10, 1960          BSA:          2.08 m Patient Age:    58 years            BP:           134/75 mmHg Patient Gender: M                   HR:           77 bpm. Exam Location:  Anesthesiology  Transesophogeal exam was perform intraoperatively during surgical procedure. Patient was closely monitored under general anesthesia during the entirety of examination.  Indications:     CABG Sonographer:     Elinor Fresh RDCS (AE) Performing Phys: [id number] [facility] Z ATKINS  Complications: No known complications during this procedure. POST-OP IMPRESSIONS - Left Ventricle: The left ventricle is unchanged from pre-bypass. - Right Ventricle: The right ventricle appears unchanged from pre-bypass. - Aorta: The aorta appears unchanged from pre-bypass. - Left Atrium: The left atrium appears unchanged from pre-bypass. - Left Atrial Appendage: The left atrial appendage appears unchanged from pre-bypass. - Aortic Valve: The aortic valve appears unchanged from pre-bypass. - Mitral Valve: The mitral valve appears unchanged from pre-bypass. - Tricuspid Valve: There is mild regurgitation. - Interatrial Septum: The interatrial septum appears unchanged from pre-bypass. - Interventricular Septum: The interventricular  septum appears unchanged from pre-bypass. - Pericardium: The pericardium appears unchanged from pre-bypass.  PRE-OP FINDINGS Left Ventricle: The left ventricle has normal systolic function, with an ejection fraction of 55-60%. The cavity size was normal. There is mild concentric left ventricular hypertrophy.  Right Ventricle: The right ventricle has normal systolic function. The cavity was normal. There is no increase in right ventricular wall thickness.  Left Atrium: Left atrial size was normal in size.  Right Atrium: Right atrial size was normal in size. Right atrial pressure is estimated at 10 mmHg.  Interatrial Septum: No atrial level shunt detected by color flow Doppler.  Pericardium: There is no evidence of pericardial effusion.  Mitral Valve: The mitral valve is normal in structure. Mitral valve regurgitation is trivial by color flow Doppler.  Tricuspid Valve: The tricuspid valve was normal in structure. Tricuspid valve regurgitation is trivial by color flow Doppler.  Aortic Valve: The aortic valve is normal in structure. Aortic valve regurgitation was not visualized by color flow Doppler. There is no stenosis of the aortic valve, with a calculated valve area of 1.70 cm.  Pulmonic Valve: The pulmonic valve was normal in structure. Pulmonic valve regurgitation is not  visualized by color flow Doppler.   Aorta: The aortic root, ascending aorta and aortic arch are normal in size and structure. There is evidence of plaque in the descending aorta.  +--------------+--------++ LEFT VENTRICLE         +--------------+--------++ PLAX 2D                +--------------+--------++ LVOT diam:    1.90 cm  +--------------+--------++ LVOT Area:    2.84 cm +--------------+--------++                        +--------------+--------++  +------------------+------------++ AORTIC VALVE                   +------------------+------------++ AV Area (Vmax):   1.58  cm     +------------------+------------++ AV Area (Vmean):  1.59 cm     +------------------+------------++ AV Area (VTI):    1.70 cm     +------------------+------------++ AV Vmax:          147.00 cm/s  +------------------+------------++ AV Vmean:         101.000 cm/s +------------------+------------++ AV VTI:           0.286 m      +------------------+------------++ AV Peak Grad:     8.6 mmHg     +------------------+------------++ AV Mean Grad:     5.0 mmHg     +------------------+------------++ LVOT Vmax:        82.00 cm/s   +------------------+------------++ LVOT Vmean:       56.800 cm/s  +------------------+------------++ LVOT VTI:         0.171 m      +------------------+------------++ LVOT/AV VTI ratio:0.60         +------------------+------------++   +--------------+-------+ SHUNTS                +--------------+-------+ Systemic VTI: 0.17 m  +--------------+-------+ Systemic Diam:1.90 cm +--------------+-------+   Lamar Needle MD Electronically signed by Lamar Needle MD Signature Date/Time: 08/26/2019/9:42:49 PM    Final  MONITORS  LONG TERM MONITOR (3-14 DAYS) 06/13/2021  Narrative  Underlying predominant rhythm is Sinus Rhythm with an average rate of 84 bpm and a rate range of 60 to 118 bpm  Very rare PACs and PVCs noted.  There are also noted appropriate pacing as well as occasional episodes where there is pacing with intrinsic beats also noted.   Patch Wear Time:  14 days and 0 hours (2023-01-23T07:23:41-0500 to 2023-02-06T07:23:45-0500)   Overall, pretty normal monitor.  Will need EP to assess pacemaker to see if there is any true arrhythmias.  Alm Clay, MD            Current Reported Medications:.    Current Meds  Medication Sig   albuterol  (VENTOLIN  HFA) 108 (90 Base) MCG/ACT inhaler Inhale 2 puffs into the lungs every 4 (four) hours as needed for wheezing or  shortness of breath.   allopurinol (ZYLOPRIM) 300 MG tablet Take 300 mg by mouth daily.   amLODipine  (NORVASC ) 2.5 MG tablet Take 1 tablet (2.5 mg total) by mouth daily.   atorvastatin  (LIPITOR ) 80 MG tablet TAKE 1 TABLET BY MOUTH DAILY (Patient taking differently: Take 80 mg by mouth every evening.)   Capsicum, Cayenne, (CAYENNE PEPPER PO) Take 1 capsule by mouth in the morning and at bedtime.   carvedilol  (COREG ) 12.5 MG tablet Take 1 tablet (12.5 mg total) by mouth 2 (two) times daily with a meal.   clopidogrel  (PLAVIX ) 75 MG tablet TAKE 1 TABLET BY MOUTH  DAILY   COLLAGEN PO Take 3 tablets by mouth in the morning and at bedtime.   Evolocumab  (REPATHA  SURECLICK) 140 MG/ML SOAJ Inject 140 mg into the skin every 14 (fourteen) days.   fluticasone (FLONASE) 50 MCG/ACT nasal spray Place 2 sprays into both nostrils as needed for congestion.   furosemide  (LASIX ) 40 MG tablet Take 1 tablet (40 mg total) by mouth daily as needed for fluid or edema (Poor more than 3 pound weight gain overnight or more than 5 pound weight gain over baseline).   gabapentin  (NEURONTIN ) 100 MG capsule Take 200 mg by mouth at bedtime.   insulin  glargine (LANTUS  SOLOSTAR) 100 UNIT/ML Solostar Pen Inject 50 Units into the skin every evening. 100UNITS/3 ML   JARDIANCE  25 MG TABS tablet Take 25 mg by mouth daily.   losartan  (COZAAR ) 25 MG tablet Take 12.5 mg by mouth daily.   MAGNESIUM  OXIDE PO Take 500 mg by mouth at bedtime. FOR LEG CRAMPS   nitroGLYCERIN  (NITROSTAT ) 0.4 MG SL tablet DISSOLVE 1 TABLET UNDER THE  TONGUE EVERY 5 MINUTES AS NEEDED FOR CHEST PAIN. MAX OF 3 TABLETS IN 15 MINUTES. CALL 911 IF PAIN  PERSISTS.   Omega-3 Fatty Acids (FISH OIL BURP-LESS) 1000 MG CAPS Take 2,000 mg by mouth daily.   OZEMPIC, 0.25 OR 0.5 MG/DOSE, 2 MG/3ML SOPN Inject 1 mg into the skin once a week.   pantoprazole  (PROTONIX ) 40 MG tablet Take 40 mg by mouth 2 (two) times daily.   predniSONE  (DELTASONE ) 50 MG tablet Take 1 tablet 13 hours  prior to procedure, 1 tablet 7 hours prior to procedure, and 1 tablet (with Benedryl 50 mg) prior to going to the hospital for procedure   ranolazine  (RANEXA ) 500 MG 12 hr tablet Take 1 tablet (500 mg total) by mouth 2 (two) times daily.   Vitamin D, Cholecalciferol , 25 MCG (1000 UT) CAPS Take 1,000 Units by mouth at bedtime.   Physical Exam:    VS:  BP 128/66 (BP Location: Left Arm, Patient Position: Sitting, Cuff Size: Normal)   Pulse 60   Ht 5' 8 (1.727 m)   Wt 198 lb (89.8 kg)   SpO2 100%   BMI 30.11 kg/m    Wt Readings from Last 3 Encounters:  05/13/23 198 lb (89.8 kg)  05/05/23 194 lb 6.4 oz (88.2 kg)  04/28/23 195 lb 5.2 oz (88.6 kg)    GEN: Well nourished, well developed in no acute distress NECK: No JVD; No carotid bruits CARDIAC: RRR, no murmurs, rubs, gallops RESPIRATORY:  Clear to auscultation without rales, wheezing or rhonchi  ABDOMEN: Soft, non-tender, non-distended EXTREMITIES:  No edema; No acute deformity   Asessement and Plan:.    CAD with angina pectoris: s/p CABGx4 with LIMA to distal LAD, sequential left raidal-OM2-PDA, RIMA-ramus. Did not tolerate Imdur  in the past for severe headache. Presented 12/27 with chest pain similar to prior angina, high-sensitivity troponin negative x 2.  Cardiac cath on 12/30 noted as above with patent LIMA to LAD, RIMA to LPDA flush occlusion of sequential left radial that did not appear to be acute graft failure.  Occluded RCA stent as well as an LAD at LIMA insertion site.  Ranexa  increased to 1000 mg twice daily with recommendation of if no improvement could consider staged PCI of the LM to oLCx.  On follow-up patient reported that he was unable to tolerate increased dose of Ranexa , this was reduced back to 500 mg twice daily and he was started on amlodipine . Discussed  with Dr. Anner, patient's primary cardiologist, recommended to trial medication changes for 1 week.  If no improvement in symptoms we will plan for staged PCI.  Today  he presents for follow up, he continues to note exertional angina with some mild shortness of breath. He notes improvement with rest and sublingual nitroglycerin . Will proceed with left heart catheterization with staged PCI of the LM to oLCx as discussed with Dr. Anner, patient is in agreement with this. Please see consent below. Patient has contrast allergy, he will take prednisone  and benadryl  prior to procedure. He will hold Jardiance , Lantus  and Ozempic as directed. Given history of CKD he will require hydration. Reviewed ED precautions with patient. Continue atorvastatin  80 mg daily, carvedilol  12.5 mg twice daily, Plavix  75 mg daily, Repatha , Lasix  40 mg daily as needed for weight gain, losartan  12.5 mg daily, Ranexa  500 mg twice daily and sublingual nitroglycerin  as needed for chest pain. Check CBC and BMET.  Informed Consent   Shared Decision Making/Informed Consent The risks [stroke (1 in 1000), death (1 in 1000), kidney failure [usually temporary] (1 in 500), bleeding (1 in 200), allergic reaction [possibly serious] (1 in 200)], benefits (diagnostic support and management of coronary artery disease) and alternatives of a cardiac catheterization were discussed in detail with Mr. Logan and he is willing to proceed.     CHB s/p Biv PPM in 2018: Stable on last interrogation on 03/15/2023.  Followed by EP.   Hypertension: Blood pressure today 128/66. Continue current antihypertensive regimen.    Hyperlipidemia: Last lipid profile on 04/10/2023 indicated total cholesterol 87, HDL 40, triglycerides 135 and LDL 24. Continue atorvastatin  and repatha .    Type II DM: Last hemoglobin A1c 6.6 on 02/11/2023. Monitored and managed per PCP. On Jardiance  and Lantus .    CKD stage III: Last creatinine 1.91 on 05/05/23. Followed by nephrology. Check BMET today.     Disposition: F/u with Micah Barnier, NP in 3-4 weeks.   Signed, Hilmar Moldovan D Willey Due, NP

## 2023-05-11 NOTE — H&P (View-Only) (Signed)
 Cardiology Office Note    Date:  05/13/2023  ID:  ELTON HEID, DOB 1960-11-14, MRN [id number] PCP:  Delilah Murray HERO., MD  Cardiologist:  Alm Clay, MD  Electrophysiologist:  Danelle Birmingham, MD   Chief Complaint: Chest pain   History of Present Illness: .    John Dodson is a 63 y.o. male with visit-pertinent history of CAD s/p multiple prior stents, CABG x 4 in 07/2019 with LIMA to distal LAD, sequential left radial to OM 2 to PDA, RIMA to ramus, complete heart block s/p biventricular PPM in 06/2016, palpitations, hypertension, hyperlipidemia, PE following COVID-19 infection, CKD stage III and type 2 diabetes.   Cardiac catheterization prior to bypass surgery in 07/2019 showed EF 55 to 65%, 60% ostial to proximal left circumflex lesion, 60% mid to distal LAD lesion, widely patent OM2 stent, 80% lateral second marginal lesion, widely patent stent in the first LPL, 50% RPA V lesion.  Echocardiogram in 2021 showed EF 55 to 60%, normal LV function.  Lexiscan  in 09/2021 for chest pain was low risk, EF 62%.  At that time it was felt that his chest discomfort was related to incision/scar pain.  In January 2024 he continued to note intermittent chest discomfort, predominantly at rest though he did report some exertional symptoms.  He was started on Ranexa  500 mg twice daily with improvement.   He was last seen in clinic on 01/02/2023, he had done well from a cardiac standpoint.  He noted occasional twinges in his chest, relieved with burping felt to be related to indigestion.   On 04/25/2023 he presented to the emergency room with chest pain.  He reported having substernal chest discomfort radiating down the left arm and also left jaw.  Discomfort occurred at rest and resolved after a second nitroglycerin .  He also reported a separate right substernal chest discomfort that was worse with palpation and deep inspiration.  He had reoccurrence of chest discomfort the following morning when he was  up ambulating. Creatinine 1.71, troponin 7>7.  Checks x-ray showed no acute findings.  His chest discomfort however is reminiscent of previous angina.  On 04/28/2023 he underwent cardiac catheterization, there was noted progression of native CAD with a now occluded RCA stent as well as mid LAD at LIMA insertion site.  There is widely patent pedicled LIMA to LAD and pedicle RIMA to LDPA with flush occlusion of sequential left radial, not felt to be acute graft failure.  His Ranexa  was increased to 1000 mg twice daily with recommendation to reassess for symptom improvement over the next 2 to 4 weeks, if unable to control symptoms recommended return for staged PCI of the left main to ostial LCx.  At office visit on 05/05/2023 he reported that he continued to have nearly daily chest discomfort, worsened by exertion.  He is able to take sublingual nitroglycerin  with improvement in symptoms.  He was unable to tolerate increased dose of Ranexa  due to dizziness and lightheadedness, his Ranexa  was decreased back to 500 mg twice daily and he was started on amlodipine  5 mg daily.  His losartan  was held for hyperkalemia.  Discussed with Dr. Clay, if no improvement in symptoms we will plan for staged PCI.  Today he presents for follow-up.  He reports that he feels as though he is not getting any better, he continues to have chest discomfort almost daily. He can rest with improvement, he has required sublingual nitroglycerin  as well. He does report some slight shortness of breath.  He denies any palpitations or lower extremity edema. Dizziness and lightheadedness improved with decreased Ranexa . He reports that he is blood pressure has been well controlled at home.    Labwork independently reviewed: 05/05/2023: Sodium 137, potassium 5.3, creatinine 1.91, EGFR 39 ROS: .   Today he denies lower extremity edema, fatigue, palpitations, melena, hematuria, hemoptysis, diaphoresis, weakness, presyncope, syncope, orthopnea, and  PND.  All other systems are reviewed and otherwise negative. Studies Reviewed: SABRA   EKG:  EKG is ordered today, personally reviewed, demonstrating  EKG Interpretation Date/Time:  Tuesday May 13 2023 13:41:27 EST Ventricular Rate:  60 PR Interval:  168 QRS Duration:  166 QT Interval:  478 QTC Calculation: 478 R Axis:   249  Text Interpretation: Atrial-paced rhythm Right bundle branch block Confirmed by Loie Jahr [phone]) on 05/13/2023 2:26:01 PM   CV Studies:  Cardiac Studies & Procedures   CARDIAC CATHETERIZATION  CARDIAC CATHETERIZATION 04/28/2023  Narrative   Ost LAD to Mid LAD lesion is 10% stenosed. Mid LAD to Dist LAD lesion is 100% stenosed.   Ost Cx to Prox Cx lesion is 65% stenosed.->  Previously evaluated prior to CABG as FFR positive   LPAV lesion is 50% stenosed.   Lat 2nd Mrg lesion is 80% stenosed.   Previously placed LPDA stent is 5% stenosed.   Ost RCA to Mid RCA STENT is 100% stenosed.   Previously placed 2nd Mrg stent of unknown type is  widely patent.   Previously placed 1st LPL stent of unknown type is  widely patent.   GRAFTS.   RIMA-PDA graft was visualized by angiography and is moderate in size. The graft exhibits no disease.   LIMA-LAD graft was visualized by angiography and is normal in caliber. The graft exhibits no disease.   Seq LRAD-OM1*LPL1(OM2) graft was injected, but not not visualized due to occlusion. Origin to Prox Graft lesion before Lat 2nd Mrg is 100% stenosed. ->  Does not appear to to be freshly occluded especially in light of negative troponins.   LV end diastolic pressure is mildly elevated.  FINDINGS SUMMARY Progression of native CAD: Now occluded RCA stent as well as mid LAD at LIMA insertion site. Widely patent pedicled LIMA-LAD and pedicle RIMA-LPDA with flush occlusion of sequential Left Radial - does not appear to be acute graft failure. Mildly Elevated LVEDP ~18 mmHg   RECOMMENDATIONS Post cath hydration an hour for 8 hours.   Closely monitor renal function Titrate medical management with increasing Ranexa  to 1000 mg twice daily -> reassess for symptom improvement over the next 2 to 4 weeks to determine if symptoms have improved, if not able to control symptoms, would return for staged PCI of the Left Main-ostial LCx.    Alm Clay, MD  Findings Coronary Findings Diagnostic  Dominance: Left  Left Main Vessel is large.  Left Anterior Descending Ost LAD to Mid LAD lesion is 10% stenosed. The lesion was previously treated using a drug eluting stent over 2 years ago. At least 2 stents Mid LAD to Dist LAD lesion is 100% stenosed. The lesion is distal to major branch, segmental, eccentric and irregular. Highly significant DFR  Third Diagonal Branch Vessel is small in size.  Left Circumflex Vessel is large. Ost Cx to Prox Cx lesion is 65% stenosed. The lesion is focal. Angiographically does not seem to be significant, however highly significant by DFR  Second Obtuse Marginal Branch Previously placed 2nd Mrg stent of unknown type is  widely patent.  Lateral Second Obtuse  Marginal Branch Vessel is small in size. Considered to be a small OM by prior cath films.  Difficult to determine if it sidebranch or true OM Lat 2nd Mrg lesion is 80% stenosed. The lesion is focal. Previously described a 75 to 80%  Left Posterior Descending Artery Vessel is large in size. LPDA lesion is 5% stenosed. The lesion was previously treated using a drug eluting stent over 2 years ago. Several overlapping stents  First Left Posterolateral Branch Vessel is large in size. Previously placed 1st LPL stent of unknown type is  widely patent.  Left Posterior Atrioventricular Artery Vessel is large in size. LPAV lesion is 50% stenosed. The lesion is located at the bend.  Right Coronary Artery Vessel was injected. Vessel is small. There is severe diffuse disease throughout the vessel. Ost RCA to Mid RCA lesion is 100% stenosed.  Diffuse The lesion was previously treated using a drug eluting stent over 2 years ago. At least 2 if not 3 overlapping DES stents Previously placed stent displays restenosis.  Acute Marginal Branch Vessel is small in size.  Right Ventricular Branch Vessel is small in size.  Right Posterior Descending Artery Vessel is small in size.  RIMA RIMA Graft To LPDA RIMA graft was visualized by angiography and is moderate in size.  The graft exhibits no disease. RIMA-PDA  LIMA LIMA Graft To Dist LAD LIMA graft was visualized by angiography and is normal in caliber.  LIMA-LAD  Sequential Left Radial Artery Graft To Lat 2nd Mrg, 2nd Mrg Left radial artery graft was not visualized due to known occlusion.  Injected - but not visualized The graft exhibits severe . SeqLRAD-OM1-LPL Origin to Prox Graft lesion before Lat 2nd Mrg  is 100% stenosed.  Intervention  No interventions have been documented.   CARDIAC CATHETERIZATION  CARDIAC CATHETERIZATION 08/20/2019  Narrative  The left ventricular systolic function is normal. The left ventricular ejection fraction is 55-65% by visual estimate. LV end diastolic pressure is normal.LV end diastolic pressure is normal.  -------------------  There is stent from ostial to almost distal small nondominant RCA: Ost RCA to Mid RCA stent is diffusely 35% stenosed.  Ost Cx to Prox Cx lesion is 60% stenosed.  Ost LAD to Mid LAD stent is 10% stenosed. Mid LAD to Dist LAD lesion is 60% stenosed.  Previously placed 2nd Mrg stent (DES) is widely patent.  Lat 2nd Mrg lesion is 80% stenosed. Previously described.  Previously placed 1st LPL stent (DES) is widely patent.  LPAV lesion is 50% stenosed.  LPDA stent is 5% stenosed.   Multivessel CAD with essentially patent stents in proximal to mid LAD, proximal OM1, proximal LPL1, several stents in the L PDA as well as RCA that is a small nondominant vessel.  No obvious lesions within stents noted.  Ostial  LCx roughly 60% --> highly DFR positive with 0.84  Long mid LAD 60% after stent--highly DFR +0.67-0.73  Normal LVEDP  With significant LAD as well as ostial LCx disease--best course of action is CVTS consultation for CABG.    Bryan Lemma, MD  Findings Coronary Findings Diagnostic  Dominance: Left  Left Main Vessel is large.  Left Anterior Descending Ost LAD to Mid LAD lesion is 10% stenosed. The lesion was previously treated using a drug eluting stent over 2 years ago. At least 2 stents Mid LAD to Dist LAD lesion is 60% stenosed. The lesion is distal to major branch, segmental, eccentric and irregular. The stenosis was measured by a visual reading.  Pressure wire/FFR was performed on the lesion. DFR 0.67 and 0.74 Highly significant DFR  Third Diagonal Branch Vessel is small in size.  Left Circumflex Vessel is large. Ost Cx to Prox Cx lesion is 60% stenosed. The lesion is focal. Pressure wire/FFR was performed on the lesion. DFR 0.84 Angiographically does not seem to be significant, however highly significant by DFR  Second Obtuse Marginal Branch Previously placed 2nd Mrg stent (unknown type) is widely patent.  Lateral Second Obtuse Marginal Branch Vessel is small in size. Considered to be a small OM by prior cath films.  Difficult to determine if it sidebranch or true OM Lat 2nd Mrg lesion is 80% stenosed. The lesion is focal. Previously described a 75 to 80%  Left Posterior Descending Artery Vessel is large in size. LPDA lesion is 5% stenosed. The lesion was previously treated using a drug eluting stent over 2 years ago. Several overlapping stents  First Left Posterolateral Branch Vessel is large in size. Previously placed 1st LPL stent (unknown type) is widely patent.  Left Posterior Atrioventricular Artery Vessel is large in size. LPAV lesion is 50% stenosed. The lesion is located at the bend.  Right Coronary Artery Vessel is small. Ost RCA to Mid RCA lesion is  35% stenosed. Diffuse The lesion was previously treated using a drug eluting stent between 6-12 months ago. At least 2 if not 3 overlapping DES stents Previously placed stent displays restenosis.  Acute Marginal Branch Vessel is small in size.  Right Ventricular Branch Vessel is small in size.  Right Posterior Descending Artery Vessel is small in size.  Intervention  No interventions have been documented.   STRESS TESTS  MYOCARDIAL PERFUSION IMAGING 10/03/2021  Narrative   The study is normal. The study is low risk.   No ST deviation was noted.   LV perfusion is normal.   Left ventricular function is normal. Nuclear stress EF: 62 %. The left ventricular ejection fraction is normal (55-65%). End diastolic cavity size is normal.   Prior study available for comparison from 08/20/2018.  Low risk stress nuclear study with normal perfusion and normal left ventricular regional and global systolic function.  ECHOCARDIOGRAM  ECHOCARDIOGRAM LIMITED 08/27/2019  Narrative ECHOCARDIOGRAM LIMITED REPORT    Patient Name:   SHIGEO ROSEMANN Date of Exam: 08/27/2019 Medical Rec #:  [id number]           Height:       68.0 in Accession #:    [id number]          Weight:       225.7 lb Date of Birth:  11-Feb-1961          BSA:          2.152 m Patient Age:    58 years            BP:           104/82 mmHg Patient Gender: M                   HR:           92 bpm. Exam Location:  Inpatient  Procedure: Limited Echo  STAT ECHO  Indications:    Tamponade 423.3  History:        Patient has prior history of Echocardiogram examinations, most recent 08/25/2019. CAD, Prior CABG, Signs/Symptoms:Chest Pain; Risk Factors:Hypertension, Diabetes, Dyslipidemia and Non-Smoker. Pulmonary embolism.  Sonographer:    Renella Cunas RDCS Referring Phys: [id number] [facility] ATKINS  Sonographer Comments: Technically difficult study due to poor echo windows. IMPRESSIONS   1. No significant pericardial  effusion noted. Likely prominent prominent epicardial fat pad. Given limited views - EF appears normal RV is normal size.  FINDINGS Pericardium: No significant pericardial effusion noted. Likely prominent prominent epicardial fat pad. Given limited views - EF appears normal RV is normal size.  Charlton Haws MD Electronically signed by Charlton Haws MD Signature Date/Time: 08/27/2019/8:15:20 AM    Final  TEE  ECHO INTRAOPERATIVE TEE 08/25/2019  Narrative *INTRAOPERATIVE TRANSESOPHAGEAL REPORT *    Patient Name:   PAULIE ASTIN Date of Exam: 08/25/2019 Medical Rec #:  [id number]           Height:       68.0 in Accession #:    [id number]          Weight:       208.2 lb Date of Birth:  [birth year]          BSA:          2.08 m Patient Age:    58 years            BP:           134/75 mmHg Patient Gender: M                   HR:           77 bpm. Exam Location:  Anesthesiology  Transesophogeal exam was perform intraoperatively during surgical procedure. Patient was closely monitored under general anesthesia during the entirety of examination.  Indications:     CABG Sonographer:     Celene Skeen RDCS (AE) Performing Phys: [id number] [facility] Z ATKINS  Complications: No known complications during this procedure. POST-OP IMPRESSIONS - Left Ventricle: The left ventricle is unchanged from pre-bypass. - Right Ventricle: The right ventricle appears unchanged from pre-bypass. - Aorta: The aorta appears unchanged from pre-bypass. - Left Atrium: The left atrium appears unchanged from pre-bypass. - Left Atrial Appendage: The left atrial appendage appears unchanged from pre-bypass. - Aortic Valve: The aortic valve appears unchanged from pre-bypass. - Mitral Valve: The mitral valve appears unchanged from pre-bypass. - Tricuspid Valve: There is mild regurgitation. - Interatrial Septum: The interatrial septum appears unchanged from pre-bypass. - Interventricular Septum: The interventricular  septum appears unchanged from pre-bypass. - Pericardium: The pericardium appears unchanged from pre-bypass.  PRE-OP FINDINGS Left Ventricle: The left ventricle has normal systolic function, with an ejection fraction of 55-60%. The cavity size was normal. There is mild concentric left ventricular hypertrophy.  Right Ventricle: The right ventricle has normal systolic function. The cavity was normal. There is no increase in right ventricular wall thickness.  Left Atrium: Left atrial size was normal in size.  Right Atrium: Right atrial size was normal in size. Right atrial pressure is estimated at 10 mmHg.  Interatrial Septum: No atrial level shunt detected by color flow Doppler.  Pericardium: There is no evidence of pericardial effusion.  Mitral Valve: The mitral valve is normal in structure. Mitral valve regurgitation is trivial by color flow Doppler.  Tricuspid Valve: The tricuspid valve was normal in structure. Tricuspid valve regurgitation is trivial by color flow Doppler.  Aortic Valve: The aortic valve is normal in structure. Aortic valve regurgitation was not visualized by color flow Doppler. There is no stenosis of the aortic valve, with a calculated valve area of 1.70 cm.  Pulmonic Valve: The pulmonic valve was normal in structure. Pulmonic valve regurgitation is not  visualized by color flow Doppler.   Aorta: The aortic root, ascending aorta and aortic arch are normal in size and structure. There is evidence of plaque in the descending aorta.  +--------------+--------++ LEFT VENTRICLE         +--------------+--------++ PLAX 2D                +--------------+--------++ LVOT diam:    1.90 cm  +--------------+--------++ LVOT Area:    2.84 cm +--------------+--------++                        +--------------+--------++  +------------------+------------++ AORTIC VALVE                   +------------------+------------++ AV Area (Vmax):   1.58  cm     +------------------+------------++ AV Area (Vmean):  1.59 cm     +------------------+------------++ AV Area (VTI):    1.70 cm     +------------------+------------++ AV Vmax:          147.00 cm/s  +------------------+------------++ AV Vmean:         101.000 cm/s +------------------+------------++ AV VTI:           0.286 m      +------------------+------------++ AV Peak Grad:     8.6 mmHg     +------------------+------------++ AV Mean Grad:     5.0 mmHg     +------------------+------------++ LVOT Vmax:        82.00 cm/s   +------------------+------------++ LVOT Vmean:       56.800 cm/s  +------------------+------------++ LVOT VTI:         0.171 m      +------------------+------------++ LVOT/AV VTI ratio:0.60         +------------------+------------++   +--------------+-------+ SHUNTS                +--------------+-------+ Systemic VTI: 0.17 m  +--------------+-------+ Systemic Diam:1.90 cm +--------------+-------+   Marcene Duos MD Electronically signed by Marcene Duos MD Signature Date/Time: 08/26/2019/9:42:49 PM    Final  MONITORS  LONG TERM MONITOR (3-14 DAYS) 06/13/2021  Narrative  Underlying predominant rhythm is Sinus Rhythm with an average rate of 84 bpm and a rate range of 60 to 118 bpm  Very rare PACs and PVCs noted.  There are also noted appropriate pacing as well as occasional episodes where there is pacing with intrinsic beats also noted.   Patch Wear Time:  14 days and 0 hours (2023-01-23T07:23:41-0500 to 2023-02-06T07:23:45-0500)   Overall, pretty normal monitor.  Will need EP to assess pacemaker to see if there is any true arrhythmias.  Bryan Lemma, MD            Current Reported Medications:.    Current Meds  Medication Sig   albuterol (VENTOLIN HFA) 108 (90 Base) MCG/ACT inhaler Inhale 2 puffs into the lungs every 4 (four) hours as needed for wheezing or  shortness of breath.   allopurinol (ZYLOPRIM) 300 MG tablet Take 300 mg by mouth daily.   amLODipine (NORVASC) 2.5 MG tablet Take 1 tablet (2.5 mg total) by mouth daily.   atorvastatin (LIPITOR) 80 MG tablet TAKE 1 TABLET BY MOUTH DAILY (Patient taking differently: Take 80 mg by mouth every evening.)   Capsicum, Cayenne, (CAYENNE PEPPER PO) Take 1 capsule by mouth in the morning and at bedtime.   carvedilol (COREG) 12.5 MG tablet Take 1 tablet (12.5 mg total) by mouth 2 (two) times daily with a meal.   clopidogrel (PLAVIX) 75 MG tablet TAKE 1 TABLET BY MOUTH  DAILY   COLLAGEN PO Take 3 tablets by mouth in the morning and at bedtime.   Evolocumab  (REPATHA  SURECLICK) 140 MG/ML SOAJ Inject 140 mg into the skin every 14 (fourteen) days.   fluticasone (FLONASE) 50 MCG/ACT nasal spray Place 2 sprays into both nostrils as needed for congestion.   furosemide  (LASIX ) 40 MG tablet Take 1 tablet (40 mg total) by mouth daily as needed for fluid or edema (Poor more than 3 pound weight gain overnight or more than 5 pound weight gain over baseline).   gabapentin  (NEURONTIN ) 100 MG capsule Take 200 mg by mouth at bedtime.   insulin  glargine (LANTUS  SOLOSTAR) 100 UNIT/ML Solostar Pen Inject 50 Units into the skin every evening. 100UNITS/3 ML   JARDIANCE  25 MG TABS tablet Take 25 mg by mouth daily.   losartan  (COZAAR ) 25 MG tablet Take 12.5 mg by mouth daily.   MAGNESIUM  OXIDE PO Take 500 mg by mouth at bedtime. FOR LEG CRAMPS   nitroGLYCERIN  (NITROSTAT ) 0.4 MG SL tablet DISSOLVE 1 TABLET UNDER THE  TONGUE EVERY 5 MINUTES AS NEEDED FOR CHEST PAIN. MAX OF 3 TABLETS IN 15 MINUTES. CALL 911 IF PAIN  PERSISTS.   Omega-3 Fatty Acids (FISH OIL BURP-LESS) 1000 MG CAPS Take 2,000 mg by mouth daily.   OZEMPIC, 0.25 OR 0.5 MG/DOSE, 2 MG/3ML SOPN Inject 1 mg into the skin once a week.   pantoprazole  (PROTONIX ) 40 MG tablet Take 40 mg by mouth 2 (two) times daily.   predniSONE  (DELTASONE ) 50 MG tablet Take 1 tablet 13 hours  prior to procedure, 1 tablet 7 hours prior to procedure, and 1 tablet (with Benedryl 50 mg) prior to going to the hospital for procedure   ranolazine  (RANEXA ) 500 MG 12 hr tablet Take 1 tablet (500 mg total) by mouth 2 (two) times daily.   Vitamin D, Cholecalciferol , 25 MCG (1000 UT) CAPS Take 1,000 Units by mouth at bedtime.   Physical Exam:    VS:  BP 128/66 (BP Location: Left Arm, Patient Position: Sitting, Cuff Size: Normal)   Pulse 60   Ht 5' 8 (1.727 m)   Wt 198 lb (89.8 kg)   SpO2 100%   BMI 30.11 kg/m    Wt Readings from Last 3 Encounters:  05/13/23 198 lb (89.8 kg)  05/05/23 194 lb 6.4 oz (88.2 kg)  04/28/23 195 lb 5.2 oz (88.6 kg)    GEN: Well nourished, well developed in no acute distress NECK: No JVD; No carotid bruits CARDIAC: RRR, no murmurs, rubs, gallops RESPIRATORY:  Clear to auscultation without rales, wheezing or rhonchi  ABDOMEN: Soft, non-tender, non-distended EXTREMITIES:  No edema; No acute deformity   Asessement and Plan:.    CAD with angina pectoris: s/p CABGx4 with LIMA to distal LAD, sequential left raidal-OM2-PDA, RIMA-ramus. Did not tolerate Imdur  in the past for severe headache. Presented 12/27 with chest pain similar to prior angina, high-sensitivity troponin negative x 2.  Cardiac cath on 12/30 noted as above with patent LIMA to LAD, RIMA to LPDA flush occlusion of sequential left radial that did not appear to be acute graft failure.  Occluded RCA stent as well as an LAD at LIMA insertion site.  Ranexa  increased to 1000 mg twice daily with recommendation of if no improvement could consider staged PCI of the LM to oLCx.  On follow-up patient reported that he was unable to tolerate increased dose of Ranexa , this was reduced back to 500 mg twice daily and he was started on amlodipine . Discussed  with Dr. Anner, patient's primary cardiologist, recommended to trial medication changes for 1 week.  If no improvement in symptoms we will plan for staged PCI.  Today  he presents for follow up, he continues to note exertional angina with some mild shortness of breath. He notes improvement with rest and sublingual nitroglycerin . Will proceed with left heart catheterization with staged PCI of the LM to oLCx as discussed with Dr. Anner, patient is in agreement with this. Please see consent below. Patient has contrast allergy, he will take prednisone  and benadryl  prior to procedure. He will hold Jardiance , Lantus  and Ozempic as directed. Given history of CKD he will require hydration. Reviewed ED precautions with patient. Continue atorvastatin  80 mg daily, carvedilol  12.5 mg twice daily, Plavix  75 mg daily, Repatha , Lasix  40 mg daily as needed for weight gain, losartan  12.5 mg daily, Ranexa  500 mg twice daily and sublingual nitroglycerin  as needed for chest pain. Check CBC and BMET.  Informed Consent   Shared Decision Making/Informed Consent The risks [stroke (1 in 1000), death (1 in 1000), kidney failure [usually temporary] (1 in 500), bleeding (1 in 200), allergic reaction [possibly serious] (1 in 200)], benefits (diagnostic support and management of coronary artery disease) and alternatives of a cardiac catheterization were discussed in detail with Mr. Logan and he is willing to proceed.     CHB s/p Biv PPM in 2018: Stable on last interrogation on 03/15/2023.  Followed by EP.   Hypertension: Blood pressure today 128/66. Continue current antihypertensive regimen.    Hyperlipidemia: Last lipid profile on 04/10/2023 indicated total cholesterol 87, HDL 40, triglycerides 135 and LDL 24. Continue atorvastatin  and repatha .    Type II DM: Last hemoglobin A1c 6.6 on 02/11/2023. Monitored and managed per PCP. On Jardiance  and Lantus .    CKD stage III: Last creatinine 1.91 on 05/05/23. Followed by nephrology. Check BMET today.     Disposition: F/u with Micah Barnier, NP in 3-4 weeks.   Signed, Hilmar Moldovan D Willey Due, NP

## 2023-05-13 ENCOUNTER — Ambulatory Visit: Payer: Medicare Other | Attending: Cardiology | Admitting: Cardiology

## 2023-05-13 ENCOUNTER — Encounter: Payer: Self-pay | Admitting: Cardiology

## 2023-05-13 VITALS — BP 128/66 | HR 60 | Ht 68.0 in | Wt 198.0 lb

## 2023-05-13 DIAGNOSIS — Z950 Presence of cardiac pacemaker: Secondary | ICD-10-CM

## 2023-05-13 DIAGNOSIS — Z79899 Other long term (current) drug therapy: Secondary | ICD-10-CM

## 2023-05-13 DIAGNOSIS — E785 Hyperlipidemia, unspecified: Secondary | ICD-10-CM

## 2023-05-13 DIAGNOSIS — I10 Essential (primary) hypertension: Secondary | ICD-10-CM

## 2023-05-13 DIAGNOSIS — N183 Chronic kidney disease, stage 3 unspecified: Secondary | ICD-10-CM

## 2023-05-13 DIAGNOSIS — E1169 Type 2 diabetes mellitus with other specified complication: Secondary | ICD-10-CM

## 2023-05-13 DIAGNOSIS — I2089 Other forms of angina pectoris: Secondary | ICD-10-CM

## 2023-05-13 DIAGNOSIS — I442 Atrioventricular block, complete: Secondary | ICD-10-CM

## 2023-05-13 DIAGNOSIS — I25119 Atherosclerotic heart disease of native coronary artery with unspecified angina pectoris: Secondary | ICD-10-CM

## 2023-05-13 DIAGNOSIS — Z951 Presence of aortocoronary bypass graft: Secondary | ICD-10-CM

## 2023-05-13 MED ORDER — PREDNISONE 50 MG PO TABS: ORAL_TABLET | ORAL | 0 refills | Status: DC

## 2023-05-13 NOTE — Patient Instructions (Signed)
 Medication Instructions:  Hold Lantus  the night before of Heart Cath Hold Jardiance  3 days Prior to Heart Cath Hold Ozempic 1 week prior to Heart Cath Take Pedisone 50 mg 1 tablet 13 hours prior to procedure, 1 tablet 7 hours prior to procedure, and 1 tablet (with Benedryl 50 mg) prior to going to the hospital for procedure. Pick up Benedryl 50 mg once a day *If you need a refill on your cardiac medications before your next appointment, please call your pharmacy*  Lab Work: Today we will need CBC and BMet If you have labs (blood work) drawn today and your tests are completely normal, you will receive your results only by: MyChart Message (if you have MyChart) OR A paper copy in the mail If you have any lab test that is abnormal or we need to change your treatment, we will call you to review the results.  Testing/Procedures: [place] [facility] [place] AT [address] [place] [address] Dept: [phone] Loc: [phone]  John Dodson  05/13/2023  You are scheduled for a Cardiac Catheterization on Wednesday, January 22 with Dr. Alm Clay.  1. Please arrive at the [facility] (Main Entrance A) at [facility]: [address] [place], [place] [address] at 7:00 AM (This time is 4 1/2 hour(s) before your procedure to ensure your preparation).   Free valet parking service is available. You will check in at ADMITTING. The support person will be asked to wait in the waiting room.  It is OK to have someone drop you off and come back when you are ready to be discharged.    Special note: Every effort is made to have your procedure done on time. Please understand that emergencies sometimes delay scheduled procedures.  2. Diet: Do not eat solid foods after midnight.  The patient may have clear liquids until 5am upon the day of the procedure.  3. Labs: You will need to have blood drawn  on Tuesday, January 14 at [facility] [address], [place]  Open: 8am - 5pm (Lunch 12:30 - 1:30)   Phone: [phone]. You do not need to be fasting.  4. Medication instructions in preparation for your procedure:   Contrast Allergy: Yes, Please take Prednisone  50mg  by mouth at: Thirteen hours prior to cath 10:30 PM on Tuesday Seven hours prior to cath 4:40 AM on Wednesday Two hours prior to cath 9:30 AM on Wednesday  On the morning of your procedure, take your Plavix /Clopidogrel  and any morning medicines NOT listed above.  You may use sips of water .  5. Plan to go home the same day, you will only stay overnight if medically necessary. 6. Bring a current list of your medications and current insurance cards. 7. You MUST have a responsible person to drive you home. 8. Someone MUST be with you the first 24 hours after you arrive home or your discharge will be delayed. 9. Please wear clothes that are easy to get on and off and wear slip-on shoes.  Thank you for allowing us  to care for you!   -- [facility]   Follow-Up: At [facility], you and your health needs are our priority.  As part of our continuing mission to provide you with exceptional heart care, we have created designated Provider Care Teams.  These Care Teams include your primary Cardiologist (physician) and Advanced Practice Providers (APPs -  Physician Assistants and Nurse Practitioners) who all work together to provide you with the care you need, when you need it.  We recommend signing up for the patient portal called MyChart.  Sign up information is provided on this After Visit Summary.  MyChart is used to connect with patients for Virtual Visits (Telemedicine).  Patients are able to view lab/test results, encounter notes, upcoming appointments, etc.  Non-urgent messages can be sent to your provider as well.   To learn more about what you can do with MyChart, go to  [email or web address].    Your next appointment:   February 11th at 1:55 pm with Katlyn West, NP

## 2023-05-14 LAB — CBC
Hematocrit: 49.2 % (ref 37.5–51.0)
Hemoglobin: 16.1 g/dL (ref 13.0–17.7)
MCH: 30.3 pg (ref 26.6–33.0)
MCHC: 32.7 g/dL (ref 31.5–35.7)
MCV: 93 fL (ref 79–97)
Platelets: 129 10*3/uL — ABNORMAL LOW (ref 150–450)
RBC: 5.32 x10E6/uL (ref 4.14–5.80)
RDW: 13.2 % (ref 11.6–15.4)
WBC: 8.8 10*3/uL (ref 3.4–10.8)

## 2023-05-14 LAB — BASIC METABOLIC PANEL
BUN/Creatinine Ratio: 16 (ref 10–24)
BUN: 27 mg/dL (ref 8–27)
CO2: 23 mmol/L (ref 20–29)
Calcium: 9.6 mg/dL (ref 8.6–10.2)
Chloride: 101 mmol/L (ref 96–106)
Creatinine, Ser: 1.74 mg/dL — ABNORMAL HIGH (ref 0.76–1.27)
Glucose: 187 mg/dL — ABNORMAL HIGH (ref 70–99)
Potassium: 4.8 mmol/L (ref 3.5–5.2)
Sodium: 141 mmol/L (ref 134–144)
eGFR: 44 mL/min/{1.73_m2} — ABNORMAL LOW (ref >59)

## 2023-05-16 ENCOUNTER — Telehealth: Payer: Self-pay

## 2023-05-16 NOTE — Telephone Encounter (Signed)
-----   Message from [facility] sent at 05/14/2023 12:59 PM EST ----- Please let Mr. Roper know that his CBC shows no evidence of anemia nor infection. His BMET shows his kidney function is slightly improved from last week and his electrolytes are normal, his blood sugar was increased. He can resume his losartan and we will plan to recheck labs after his cath. Please remind him to monitor his blood pressure at home.

## 2023-05-16 NOTE — Telephone Encounter (Signed)
Called patient advised of below they verbalized understanding.

## 2023-05-20 ENCOUNTER — Telehealth: Payer: Self-pay | Admitting: *Deleted

## 2023-05-20 NOTE — Telephone Encounter (Signed)
Cardiac Catheterization scheduled at [facility] for: Wednesday May 21, 2023 10 AM Arrival time [facility] Main Entrance A at: 5:30 AM-pre-procedure hydration  Nothing to eat after midnight prior to procedure, clear liquids until 5 AM day of procedure.  CONTRAST ALLERGY: yes-13 hour Prednisone and Benadryl prep reviewed with patient: 05/20/23 Prednisone 50 mg  at 9 PM 05/21/23 Prednisone 50 mg at 3 AM 05/21/23 Prednisone 50 mg and Benadryl 50 mg-pt will take with him to hospital and take at 8 AM I have asked pt to let nursing staff know he has medication with him.  Medication instructions: -Hold:  Lasix/Losartan-day before and day of procedure-per protocol GFR <60 (46)-pt has already taken today  Jardiance-AM of procedure  Insulin-1/2 usual Insulin dose HS prior to procedure-pt reports he does not take Insulin in the mornings -Other usual morning medications can be taken with sips of water including aspirin 81 mg and Plavix 75 mg.  Patient reports he has not taken Ozempic is several weeks.  Plan to go home the same day, you will only stay overnight if medically necessary.  You must have responsible adult to drive you home.  Someone must be with you the first 24 hours after you arrive home.  Reviewed procedure instructions /pre-procedure hydration with patient.

## 2023-05-21 ENCOUNTER — Encounter (HOSPITAL_COMMUNITY)
Admission: RE | Disposition: A | Discharge status: Home / Self Care | Payer: Self-pay | Source: Home / Self Care | Attending: Cardiology

## 2023-05-21 ENCOUNTER — Other Ambulatory Visit: Payer: Self-pay

## 2023-05-21 ENCOUNTER — Other Ambulatory Visit (HOSPITAL_COMMUNITY): Payer: Self-pay

## 2023-05-21 ENCOUNTER — Ambulatory Visit (HOSPITAL_COMMUNITY)
Admission: RE | Admit: 2023-05-21 | Discharge: 2023-05-21 | Disposition: A | Discharge status: Home / Self Care | Payer: Medicare Other | Attending: Cardiology | Admitting: Cardiology

## 2023-05-21 DIAGNOSIS — I25118 Atherosclerotic heart disease of native coronary artery with other forms of angina pectoris: Secondary | ICD-10-CM | POA: Insufficient documentation

## 2023-05-21 DIAGNOSIS — Z86711 Personal history of pulmonary embolism: Secondary | ICD-10-CM | POA: Insufficient documentation

## 2023-05-21 DIAGNOSIS — Z7902 Long term (current) use of antithrombotics/antiplatelets: Secondary | ICD-10-CM | POA: Diagnosis not present

## 2023-05-21 DIAGNOSIS — Z950 Presence of cardiac pacemaker: Secondary | ICD-10-CM | POA: Diagnosis not present

## 2023-05-21 DIAGNOSIS — Z7985 Long-term (current) use of injectable non-insulin antidiabetic drugs: Secondary | ICD-10-CM | POA: Insufficient documentation

## 2023-05-21 DIAGNOSIS — Z7984 Long term (current) use of oral hypoglycemic drugs: Secondary | ICD-10-CM | POA: Insufficient documentation

## 2023-05-21 DIAGNOSIS — E785 Hyperlipidemia, unspecified: Secondary | ICD-10-CM | POA: Insufficient documentation

## 2023-05-21 DIAGNOSIS — Z794 Long term (current) use of insulin: Secondary | ICD-10-CM | POA: Diagnosis not present

## 2023-05-21 DIAGNOSIS — Z955 Presence of coronary angioplasty implant and graft: Secondary | ICD-10-CM | POA: Diagnosis not present

## 2023-05-21 DIAGNOSIS — E1122 Type 2 diabetes mellitus with diabetic chronic kidney disease: Secondary | ICD-10-CM | POA: Diagnosis not present

## 2023-05-21 DIAGNOSIS — Z79899 Other long term (current) drug therapy: Secondary | ICD-10-CM | POA: Diagnosis not present

## 2023-05-21 DIAGNOSIS — Z7982 Long term (current) use of aspirin: Secondary | ICD-10-CM | POA: Diagnosis not present

## 2023-05-21 DIAGNOSIS — I25119 Atherosclerotic heart disease of native coronary artery with unspecified angina pectoris: Secondary | ICD-10-CM | POA: Diagnosis present

## 2023-05-21 DIAGNOSIS — Z951 Presence of aortocoronary bypass graft: Secondary | ICD-10-CM | POA: Diagnosis not present

## 2023-05-21 DIAGNOSIS — I129 Hypertensive chronic kidney disease with stage 1 through stage 4 chronic kidney disease, or unspecified chronic kidney disease: Secondary | ICD-10-CM | POA: Diagnosis not present

## 2023-05-21 DIAGNOSIS — I442 Atrioventricular block, complete: Secondary | ICD-10-CM | POA: Diagnosis not present

## 2023-05-21 DIAGNOSIS — I2089 Other forms of angina pectoris: Secondary | ICD-10-CM

## 2023-05-21 DIAGNOSIS — N183 Chronic kidney disease, stage 3 unspecified: Secondary | ICD-10-CM | POA: Diagnosis not present

## 2023-05-21 HISTORY — PX: LEFT HEART CATH: CATH118248

## 2023-05-21 HISTORY — PX: CORONARY STENT INTERVENTION: CATH118234

## 2023-05-21 LAB — GLUCOSE, CAPILLARY
Glucose-Capillary: 238 mg/dL — ABNORMAL HIGH (ref 70–99)
Glucose-Capillary: 192 mg/dL — ABNORMAL HIGH (ref 70–99)

## 2023-05-21 LAB — POCT ACTIVATED CLOTTING TIME
Activated Clotting Time: 510 s
Activated Clotting Time: 527 s

## 2023-05-21 SURGERY — CORONARY STENT INTERVENTION
Anesthesia: LOCAL

## 2023-05-21 MED ORDER — ASPIRIN 81 MG PO CHEW: 81.0000 mg | CHEWABLE_TABLET | ORAL | Status: DC

## 2023-05-21 MED ORDER — HYDRALAZINE HCL 20 MG/ML IJ SOLN: 10.0000 mg | INTRAMUSCULAR | Status: AC | PRN

## 2023-05-21 MED ORDER — LIDOCAINE HCL (PF) 1 % IJ SOLN
INTRAMUSCULAR | Status: AC
Start: 2023-05-21 — End: ?
  Filled 2023-05-21: qty 30

## 2023-05-21 MED ORDER — HEPARIN SODIUM (PORCINE) 1000 UNIT/ML IJ SOLN
INTRAMUSCULAR | Status: AC
Start: 2023-05-21 — End: ?
  Filled 2023-05-21: qty 10

## 2023-05-21 MED ORDER — HEPARIN SODIUM (PORCINE) 1000 UNIT/ML IJ SOLN
INTRAMUSCULAR | Status: DC | PRN
  Administered 2023-05-21: 10000 [IU] via INTRAVENOUS

## 2023-05-21 MED ORDER — SODIUM CHLORIDE 0.9 % IV SOLN: 250.0000 mL | INTRAVENOUS | Status: DC | PRN

## 2023-05-21 MED ORDER — MIDAZOLAM HCL 2 MG/2ML IJ SOLN
INTRAMUSCULAR | Status: DC | PRN
  Administered 2023-05-21: 1 mg via INTRAVENOUS
  Administered 2023-05-21: 2 mg via INTRAVENOUS

## 2023-05-21 MED ORDER — LIDOCAINE HCL (PF) 1 % IJ SOLN
INTRAMUSCULAR | Status: DC | PRN
  Administered 2023-05-21: 10 mL

## 2023-05-21 MED ORDER — VERAPAMIL HCL 2.5 MG/ML IV SOLN
INTRAVENOUS | Status: AC
  Filled 2023-05-21: qty 2

## 2023-05-21 MED ORDER — HEPARIN (PORCINE) IN NACL 1000-0.9 UT/500ML-% IV SOLN
INTRAVENOUS | Status: DC | PRN
  Administered 2023-05-21: 500 mL
  Administered 2023-05-21: 500 mL via INTRA_ARTERIAL
  Administered 2023-05-21: 500 mL

## 2023-05-21 MED ORDER — VERAPAMIL HCL 2.5 MG/ML IV SOLN
INTRAVENOUS | Status: DC | PRN
  Administered 2023-05-21: 10 mL via INTRA_ARTERIAL

## 2023-05-21 MED ORDER — SODIUM CHLORIDE 0.9 % WEIGHT BASED INFUSION
1.0000 mL/kg/h | INTRAVENOUS | Status: DC
  Administered 2023-05-21: 1 mL/kg/h via INTRAVENOUS

## 2023-05-21 MED ORDER — IOHEXOL 350 MG/ML SOLN
INTRAVENOUS | Status: DC | PRN
  Administered 2023-05-21: 60 mL

## 2023-05-21 MED ORDER — SODIUM CHLORIDE 0.9 % WEIGHT BASED INFUSION
3.0000 mL/kg/h | INTRAVENOUS | Status: AC
  Administered 2023-05-21: 3 mL/kg/h via INTRAVENOUS

## 2023-05-21 MED ORDER — SODIUM CHLORIDE 0.9% FLUSH: 3.0000 mL | Freq: Two times a day (BID) | INTRAVENOUS | Status: DC

## 2023-05-21 MED ORDER — ONDANSETRON HCL 4 MG/2ML IJ SOLN: 4.0000 mg | Freq: Four times a day (QID) | INTRAMUSCULAR | Status: DC | PRN

## 2023-05-21 MED ORDER — SODIUM CHLORIDE 0.9% FLUSH: 3.0000 mL | INTRAVENOUS | Status: DC | PRN

## 2023-05-21 MED ORDER — FENTANYL CITRATE (PF) 100 MCG/2ML IJ SOLN
INTRAMUSCULAR | Status: AC
  Filled 2023-05-21: qty 2

## 2023-05-21 MED ORDER — FENTANYL CITRATE (PF) 100 MCG/2ML IJ SOLN
INTRAMUSCULAR | Status: DC | PRN
  Administered 2023-05-21: 25 ug via INTRAVENOUS
  Administered 2023-05-21: 50 ug via INTRAVENOUS

## 2023-05-21 MED ORDER — ASPIRIN 81 MG PO TBEC
81.0000 mg | DELAYED_RELEASE_TABLET | Freq: Every day | ORAL | 3 refills | Status: AC
  Filled 2023-05-21: qty 30, 30d supply, fill #0

## 2023-05-21 MED ORDER — NITROGLYCERIN 1 MG/10 ML FOR IR/CATH LAB
INTRA_ARTERIAL | Status: AC
  Filled 2023-05-21: qty 10

## 2023-05-21 MED ORDER — ACETAMINOPHEN 325 MG PO TABS
650.0000 mg | ORAL_TABLET | ORAL | Status: DC | PRN
Start: 2023-05-21 — End: 2023-05-22
  Administered 2023-05-21: 650 mg via ORAL
  Filled 2023-05-21: qty 2

## 2023-05-21 MED ORDER — LABETALOL HCL 5 MG/ML IV SOLN: 10.0000 mg | INTRAVENOUS | Status: AC | PRN

## 2023-05-21 MED ORDER — MIDAZOLAM HCL 2 MG/2ML IJ SOLN
INTRAMUSCULAR | Status: AC
  Filled 2023-05-21: qty 2

## 2023-05-21 SURGICAL SUPPLY — 21 items
BALL SAPPHIRE NC24 4.5X15 (BALLOONS) ×1
BALLN EMERGE MR 3.0X15 (BALLOONS) ×1
BALLN SCOREFLEX 3.50X15 (BALLOONS) ×1
BALLN SCOREFLEX 4.0X15 (BALLOONS) ×1
BALLOON EMERGE MR 3.0X15 (BALLOONS) IMPLANT
BALLOON SAPPHIRE NC24 4.5X15 (BALLOONS) IMPLANT
BALLOON SCOREFLEX 3.50X15 (BALLOONS) IMPLANT
BALLOON SCOREFLEX 4.0X15 (BALLOONS) IMPLANT
CATH INFINITI JR4 5F (CATHETERS) IMPLANT
CATH LAUNCHER 6FR EBU3.5 (CATHETERS) IMPLANT
DEVICE RAD COMP TR BAND LRG (VASCULAR PRODUCTS) IMPLANT
ELECT DEFIB PAD ADLT CADENCE (PAD) IMPLANT
GLIDESHEATH SLEND SS 6F .021 (SHEATH) IMPLANT
GUIDEWIRE INQWIRE 1.5J.035X260 (WIRE) IMPLANT
INQWIRE 1.5J .035X260CM (WIRE) ×1
KIT ENCORE 26 ADVANTAGE (KITS) IMPLANT
PACK CARDIAC CATHETERIZATION (CUSTOM PROCEDURE TRAY) ×1 IMPLANT
SET ATX-X65L (MISCELLANEOUS) IMPLANT
SHEATH PROBE COVER 6X72 (BAG) IMPLANT
STENT ONYX FRONTIER 4.0X30 (Permanent Stent) IMPLANT
WIRE ASAHI PROWATER 180CM (WIRE) IMPLANT

## 2023-05-21 NOTE — Discharge Instructions (Signed)

## 2023-05-21 NOTE — Progress Notes (Signed)
TR band removed at 1645, gauze dressing applied. Right ulnar level 0, clean, dry, and intact. Patient walked to the bathroom without difficulties.

## 2023-05-21 NOTE — Discharge Summary (Addendum)
Discharge Summary for Same Day PCI   Patient ID: John Dodson MRN: [id number]; DOB: [birth year]  Admit date: 05/21/2023 Discharge date: 05/21/2023  Primary Care Provider: Andreas Blower., MD  Primary Cardiologist: Bryan Lemma, MD  Primary Electrophysiologist:  Lewayne Bunting, MD   Discharge Diagnoses    Active Problems:   Coronary artery disease involving native coronary artery of native heart with angina pectoris [facility])    Diagnostic Studies/Procedures    Cardiac Catheterization 05/21/2023:    Mid LM to Ost LAD lesion is 50% stenosed with 80% stenosed side branch in Ost Cx to Prox Cx.  Prox Cx to Mid Cx lesion is 45% stenosed.  TIMI-3 flow   ScoreFlex angioplasty was performed (using sequential balloons from 3.0 mm standard balloon to 3.5 and 4.0 mm Slater-Marietta scoring balloons) in the main branch and side branch.   A drug-eluting stent was successfully placed from Ostial Left Main through the proximal LCx 45% lesion using a STENT ONYX FRONTIER 4.0X30.  Postdilated to 4.6 mm.  TIMI-3 flow maintained   Ost LAD to Prox LAD lesion is 60% stenosed -stable post PCI; Mid LAD to Dist LAD lesion is 100% stenosed.   Lat 2nd Mrg lesion is 80% stenosed.   LPAV lesion is 50% stenosed.  LPDA lesion is 5% stenosed.   1st LPL & 2nd Mrg stents are widely patent   Post intervention, there is a 0% residual stenosis.   Post intervention, there is a 0% residual stenosis.   Post intervention, the side branch was reduced to 0% residual stenosis.   RIMA graft was not injected and is moderate in size.   LIMA graft was not injected and is normal in caliber.   Left radial artery graft was not injected.   The graft exhibits no disease.   The graft exhibits severe .   LV end diastolic pressure is normal.   There is no aortic valve stenosis.   Successful Left Main-Ostial to Proximal LCx DES PCI with score flex PTCA pretreatment using a Onyx Frontier DES 4.0 mm x 30 mm postdilated to 4.6 mm. 80% lesion  reduced to 0% with TIMI-3 flow. Sidebranch flow in the LAD maintained.  LVEDP 16 mmHg.  Okay for post cath hydration   RECOMMENDATION   In the absence of any other complications or medical issues, we expect the patient to be ready for discharge from an interventional cardiology perspective on 05/21/2023.         Will allow for 8 hours post cath hydration given his creatinine of 1.7.   Recommend uninterrupted dual antiplatelet therapy with Aspirin 81mg  daily and Clopidogrel 75mg  daily for a minimum of 6 months (stable ischemic heart disease-Class I recommendation).   With then continue Plavix monotherapy (SAPT) for lifelong maintenance therapy.  Diagnostic Dominance: Left  Intervention   _____________   History of Present Illness     John Dodson is a 63 y.o. male with visit-pertinent history of CAD s/p multiple prior stents, CABG x 4 in 07/2019 with LIMA to distal LAD, sequential left radial to OM 2 to PDA, RIMA to ramus, complete heart block s/p biventricular PPM in 06/2016, palpitations, hypertension, hyperlipidemia, PE following COVID-19 infection, CKD stage III and type 2 diabetes.   On 04/25/2023 he presented to the emergency room with chest pain. He reported having substernal chest discomfort radiating down the left arm and also left jaw. Discomfort occurred at rest and resolved after a second nitroglycerin. He also reported a separate right  substernal chest discomfort that was worse with palpation and deep inspiration. He had reoccurrence of chest discomfort the following morning when he was up ambulating. Creatinine 1.71, troponin 7>7. Checks x-ray showed no acute findings. His chest discomfort however is reminiscent of previous angina. On 04/28/2023 he underwent cardiac catheterization, there was noted progression of native CAD with a now occluded RCA stent as well as mid LAD at LIMA insertion site. There is widely patent pedicled LIMA to LAD and pedicle RIMA to LDPA with flush  occlusion of sequential left radial, not felt to be acute graft failure. His Ranexa was increased to 1000 mg twice daily with recommendation to reassess for symptom improvement over the next 2 to 4 weeks, if unable to control symptoms recommended return for staged PCI of the left main to ostial LCx.   At office visit on 05/05/2023 he reported that he continued to have nearly daily chest discomfort, worsened by exertion. He is able to take sublingual nitroglycerin with improvement in symptoms. He was unable to tolerate increased dose of Ranexa due to dizziness and lightheadedness, his Ranexa was decreased back to 500 mg twice daily and he was started on amlodipine 5 mg daily. His losartan was held for hyperkalemia.  At follow up on 05/13/2023, patient reported no improvement in his discomfort/daily pain. Given persistent angina with known CAD as above, LHC arranged.    Hospital Course     The patient underwent cardiac cath as noted above with Dr. Herbie Baltimore. Plan for DAPT with ASA/Plavix for at least 6 months, then lifelong Plavix monotherapy. The patient was seen by cardiac rehab while in short stay. There were no observed complications post cath. While in short stay, patient did develop a hematoma immediately above the TR band. BP cuff protocol used. Dr. Herbie Baltimore evaluated site and agreed with BP cuff placement. TR band removed at 1645. Right ulnar cath site was re-evaluated prior to discharge and found to be stable without any complications. Received 8 hours of IV fluids in short stay prior to DC. Instructions/precautions regarding cath site care were given prior to discharge.  [name] was seen by Dr. Herbie Baltimore and determined stable for discharge home. Follow up with our office has been arranged. Medications are listed below.    _____________  Cath/PCI Registry Performance & Quality Measures: Aspirin prescribed? - Yes ADP Receptor Inhibitor (Plavix/Clopidogrel, Brilinta/Ticagrelor or  Effient/Prasugrel) prescribed (includes medically managed patients)? - Yes High Intensity Statin (Lipitor 40-80mg  or Crestor 20-40mg ) prescribed? - Yes For EF <40%, was ACEI/ARB prescribed? - No - Outpatient Echocardiogram to assess EF will be scheduled. For EF <40%, Aldosterone Antagonist (Spironolactone or Eplerenone) prescribed? - No - Outpatient Echocardiogram to assess EF will be scheduled. Cardiac Rehab Phase II ordered (Included Medically managed Patients)? - Yes  _____________   Discharge Vitals Blood pressure 134/60, pulse 81, temperature 98.1 F (36.7 C), temperature source Oral, resp. rate 17, height 5\' 8"  (1.727 m), weight 88 kg, SpO2 98%.  Filed Weights   05/21/23 0500 05/21/23 0543  Weight: 88 kg 88 kg    Last Labs & Radiologic Studies    CBC No results for input(s): "WBC", "NEUTROABS", "HGB", "HCT", "MCV", "PLT" in the last 72 hours. Basic Metabolic Panel No results for input(s): "NA", "K", "CL", "CO2", "GLUCOSE", "BUN", "CREATININE", "CALCIUM", "MG", "PHOS" in the last 72 hours. Liver Function Tests No results for input(s): "AST", "ALT", "ALKPHOS", "BILITOT", "PROT", "ALBUMIN" in the last 72 hours. No results for input(s): "LIPASE", "AMYLASE" in the last 72 hours.  High Sensitivity Troponin:   Recent Labs  Lab 04/25/23 1250 04/25/23 1446  TROPONINIHS 7 7    BNP Invalid input(s): "POCBNP" D-Dimer No results for input(s): "DDIMER" in the last 72 hours. Hemoglobin A1C No results for input(s): "HGBA1C" in the last 72 hours. Fasting Lipid Panel No results for input(s): "CHOL", "HDL", "LDLCALC", "TRIG", "CHOLHDL", "LDLDIRECT" in the last 72 hours. Thyroid Function Tests No results for input(s): "TSH", "T4TOTAL", "T3FREE", "THYROIDAB" in the last 72 hours.  Invalid input(s): "FREET3" _____________  CARDIAC CATHETERIZATION Result Date: 05/21/2023   Mid LM to Ost LAD lesion is 50% stenosed with 80% stenosed side branch in Ost Cx to Prox Cx.  Prox Cx to Mid Cx  lesion is 45% stenosed.  TIMI-3 flow   ScoreFlex angioplasty was performed (using sequential balloons from 3.0 mm standard balloon to 3.5 and 4.0 mm [place] scoring balloons) in the main branch and side branch.   A drug-eluting stent was successfully placed from Ostial Left Main through the proximal LCx 45% lesion using a STENT ONYX FRONTIER 4.0X30.  Postdilated to 4.6 mm.   Post intervention, there is a 0% residual stenosis throughout the stented segment in the LM and LCx.  TIMI-3 flow maintained   Ost LAD to Prox LAD lesion is 60% stenosed -stable post PCI; Mid LAD to Dist LAD lesion is 100% stenosed.   ------------------------------   Lat 2nd Mrg lesion is 80% stenosed.   LPAV lesion is 50% stenosed.  LPDA lesion is 5% stenosed.   1st LPL & 2nd Mrg stents are widely patent   ------------------------------   LV end diastolic pressure is normal.   There is no aortic valve stenosis. Successful Left Main-Ostial to Proximal LCx DES PCI with score flex PTCA pretreatment using a Onyx Frontier DES 4.0 mm x 30 mm postdilated to 4.6 mm. 80% lesion reduced to 0% with TIMI-3 flow. Sidebranch flow in the LAD maintained. LVEDP 16 mmHg.  Okay for post cath hydration RECOMMENDATION   In the absence of any other complications or medical issues, we expect the patient to be ready for discharge from an interventional cardiology perspective on 05/21/2023.    Will allow for 8 hours post cath hydration given his creatinine of 1.7.   Recommend uninterrupted dual antiplatelet therapy with Aspirin 81mg  daily and Clopidogrel 75mg  daily for a minimum of 6 months (stable ischemic heart disease-Class I recommendation).   With then continue Plavix monotherapy (SAPT) for lifelong maintenance therapy.  Bryan Lemma, MD   CARDIAC CATHETERIZATION Addendum Date: 04/28/2023   John Dodson LAD to Mid LAD lesion is 10% stenosed. Mid LAD to Dist LAD lesion is 100% stenosed.   Ost Cx to Prox Cx lesion is 65% stenosed.->  Previously evaluated prior to CABG as FFR  positive   LPAV lesion is 50% stenosed.   Lat 2nd Mrg lesion is 80% stenosed.   Previously placed LPDA stent is 5% stenosed.   Ost RCA to Mid RCA STENT is 100% stenosed.   Previously placed 2nd Mrg stent of unknown type is  widely patent.   Previously placed 1st LPL stent of unknown type is  widely patent.   GRAFTS.   RIMA-PDA graft was visualized by angiography and is moderate in size. The graft exhibits no disease.   LIMA-LAD graft was visualized by angiography and is normal in caliber. The graft exhibits no disease.   Seq LRAD-OM1*LPL1(OM2) graft was injected, but not not visualized due to occlusion. Origin to Prox Graft lesion before Lat 2nd Mrg is  100% stenosed. ->  Does not appear to to be freshly occluded especially in light of negative troponins.   LV end diastolic pressure is mildly elevated. FINDINGS SUMMARY Progression of native CAD: Now occluded RCA stent as well as mid LAD at LIMA insertion site. Widely patent pedicled LIMA-LAD and pedicle RIMA-LPDA with flush occlusion of sequential Left Radial - does not appear to be acute graft failure. Mildly Elevated LVEDP ~18 mmHg RECOMMENDATIONS Post cath hydration an hour for 8 hours.  Closely monitor renal function Titrate medical management with increasing Ranexa to 1000 mg twice daily -> reassess for symptom improvement over the next 2 to 4 weeks to determine if symptoms have improved, if not able to control symptoms, would return for staged PCI of the Left Main-ostial LCx. Bryan Lemma, MD   Result Date: 04/28/2023   John Dodson LAD to Mid LAD lesion is 10% stenosed. Mid LAD to Dist LAD lesion is 100% stenosed.   Ost Cx to Prox Cx lesion is 65% stenosed.->  Previously evaluated prior to CABG as FFR positive   LPAV lesion is 50% stenosed.   Lat 2nd Mrg lesion is 80% stenosed.   Previously placed LPDA stent is 5% stenosed.   Ost RCA to Mid RCA STENT is 100% stenosed.   Previously placed 2nd Mrg stent of unknown type is  widely patent.   Previously placed 1st LPL  stent of unknown type is  widely patent.   GRAFTS.   RIMA-PDA graft was visualized by angiography and is moderate in size. The graft exhibits no disease.   LIMA-LAD graft was visualized by angiography and is normal in caliber. The graft exhibits no disease.   Seq LRAD-OM1*LPL1(OM2) graft was injected, but not not visualized due to occlusion. Origin to Prox Graft lesion before Lat 2nd Mrg is 100% stenosed. ->  Does not appear to to be freshly occluded especially in light of negative troponins.   LV end diastolic pressure is mildly elevated. FINDINGS SUMMARY Progression of native CAD: Now occluded RCA stent as well as mid LAD at LIMA insertion site. Widely patent pedicled LIMA-LAD and pedicle RIMA-LPDA with flush occlusion of sequential Left Radial - does not appear to be acute graft failure. Mildly Elevated LVEDP ~18 mmHg RECOMMENDATIONS Post cath hydration an hour for 8 hours.  Closely monitor renal function Titrate medical management with increasing Ranexa to 1000 mg twice daily -> reassess for symptom improvement over the next 2 to 4 weeks to determine if symptoms have improved, if not able to control symptoms, would return for staged PCI of the Left Main-ostial LCx. Bryan Lemma, MD   DG Chest 2 View Result Date: 04/25/2023 CLINICAL DATA:  Chest pain. EXAM: CHEST - 2 VIEW COMPARISON:  11/23/2019. FINDINGS: Bilateral lung fields are clear. Bilateral costophrenic angles are clear. Normal cardio-mediastinal silhouette. There is a left sided 3-lead pacemaker. No acute osseous abnormalities. The soft tissues are within normal limits. IMPRESSION: *No active cardiopulmonary disease. Electronically Signed   By: Jules Schick M.D.   On: 04/25/2023 14:35    Disposition   Pt is being discharged home today in good condition.  Follow-up Plans & Appointments     Discharge Instructions     Amb Referral to Cardiac Rehabilitation   Complete by: As directed    Diagnosis: Coronary Stents   After initial  evaluation and assessments completed: Virtual Based Care may be provided alone or in conjunction with Phase 2 Cardiac Rehab based on patient barriers.: Yes   Intensive Cardiac Rehabilitation (ICR)  MC location only OR Traditional Cardiac Rehabilitation (TCR) *If criteria for ICR are not met will enroll in TCR [facility] only): Yes        Discharge Medications   Allergies as of 05/21/2023       Reactions   Contrast Media [iodinated Contrast Media] Rash, Other (See Comments)   Reportedly cardiac arrest   Integrilin [eptifibatide] Shortness Of Breath, Other (See Comments)   Reportedly had shortness of breath, confusion.   Codeine Nausea And Vomiting   Wound Dressing Adhesive Hives   Zithromax [azithromycin] Nausea And Vomiting   Glipizide Rash, Other (See Comments)   Headache        Medication List     TAKE these medications    albuterol 108 (90 Base) MCG/ACT inhaler Commonly known as: VENTOLIN HFA Inhale 2 puffs into the lungs every 4 (four) hours as needed for wheezing or shortness of breath.   allopurinol 300 MG tablet Commonly known as: ZYLOPRIM Take 300 mg by mouth daily.   amLODipine 2.5 MG tablet Commonly known as: NORVASC Take 1 tablet (2.5 mg total) by mouth daily.   aspirin EC 81 MG tablet Take 1 tablet (81 mg total) by mouth daily. Swallow whole.   atorvastatin 80 MG tablet Commonly known as: LIPITOR TAKE 1 TABLET BY MOUTH DAILY What changed: when to take this   carvedilol 12.5 MG tablet Commonly known as: COREG Take 1 tablet (12.5 mg total) by mouth 2 (two) times daily with a meal.   CAYENNE PEPPER PO Take 1 capsule by mouth in the morning and at bedtime.   clopidogrel 75 MG tablet Commonly known as: PLAVIX TAKE 1 TABLET BY MOUTH DAILY   COLLAGEN PO Take 2 tablets by mouth in the morning and at bedtime.   [employer] Magnesium 500 MG Tabs Generic drug: Magnesium Oxide -Mg Supplement Take 500 mg by mouth daily.   Fish Oil Burp-Less 1000 MG Caps Take 2,000  mg by mouth daily.   furosemide 40 MG tablet Commonly known as: LASIX Take 1 tablet (40 mg total) by mouth daily as needed for fluid or edema (Poor more than 3 pound weight gain overnight or more than 5 pound weight gain over baseline).   gabapentin 100 MG capsule Commonly known as: NEURONTIN Take 200 mg by mouth at bedtime.   Jardiance 25 MG Tabs tablet Generic drug: empagliflozin Take 25 mg by mouth daily.   Lantus SoloStar 100 UNIT/ML Solostar Pen Generic drug: insulin glargine Inject 50 Units into the skin every evening. 100UNITS/3 ML   losartan 25 MG tablet Commonly known as: COZAAR Take 12.5 mg by mouth daily.   nitroGLYCERIN 0.4 MG SL tablet Commonly known as: NITROSTAT DISSOLVE 1 TABLET UNDER THE  TONGUE EVERY 5 MINUTES AS NEEDED FOR CHEST PAIN. MAX OF 3 TABLETS IN 15 MINUTES. CALL 911 IF PAIN  PERSISTS.   Ozempic (1 MG/DOSE) 2 MG/1.5ML Sopn Generic drug: Semaglutide (1 MG/DOSE) Inject 1 mg into the skin every Tuesday.   pantoprazole 40 MG tablet Commonly known as: PROTONIX Take 40 mg by mouth 2 (two) times daily.   predniSONE 50 MG tablet Commonly known as: DELTASONE Take 1 tablet 13 hours prior to procedure, 1 tablet 7 hours prior to procedure, and 1 tablet (with Benedryl 50 mg) prior to going to the hospital for procedure   ranolazine 500 MG 12 hr tablet Commonly known as: Ranexa Take 1 tablet (500 mg total) by mouth 2 (two) times daily.   Repatha SureClick 140 MG/ML Soaj Generic  drug: Evolocumab Inject 140 mg into the skin every 14 (fourteen) days.   Vitamin D (Cholecalciferol) 25 MCG (1000 UT) Caps Take 1,000 Units by mouth at bedtime.           Allergies Allergies  Allergen Reactions   Contrast Media [Iodinated Contrast Media] Rash and Other (See Comments)    Reportedly cardiac arrest   Integrilin [Eptifibatide] Shortness Of Breath and Other (See Comments)    Reportedly had shortness of breath, confusion.   Codeine Nausea And Vomiting    Wound Dressing Adhesive Hives   Zithromax [Azithromycin] Nausea And Vomiting   Glipizide Rash and Other (See Comments)    Headache    Outstanding Labs/Studies     Duration of Discharge Encounter   Greater than 30 minutes including physician time.  ATTENDING ATTESTATION  I have seen, examined and evaluated the patient this evening post-cath along with Robet Leu, PA and Perlie Gold, PA.  After reviewing all the available data and chart, we discussed the patients laboratory, study & physical findings as well as symptoms in detail.  I agree with her findings, examination as well as impression recommendations as per our discussion.    Patient had a relatively uneventful successful PCI of left main-LCx with excellent results.  He did have a right forearm hematoma noted shortly after arrival to the short stay area.  Protocol performed with using the blood pressure cuff inflation for hemostasis.  After initially taking it down it was placed back on again.  On reassessment I went over personally for a second time spending 10 minutes consulting with Dr. Sherald Hess from vascular surgery.  We both felt that although it is somewhat tender and firm on the forearm, there is no evidence of compartment syndrome.  He has excellent strength and tactile sensation with good pulses in the ulnar artery.  Good capillary refill.  The radial artery does not have good pulses, but it was very diminutive on ultrasound and therefore would explain his reduced pulse.  We did give him some analgesics for pain relief.  The recommendation for his swelling was to use ice pack for the last several hours in the short stay area.  He may use ice pack over the course of the night 30 minutes on 30 minutes off and then will use warm compress over the next several days to help with hematoma resorption. Otherwise standard post radial/ulnar cath instructions will be provided on discharge. He is already on aspirin and  Plavix. Is stable for discharge once his IV fluids are completed provided there is no worsening of his arm swelling.     Marykay Lex, MD, MS Bryan Lemma, M.D., M.S. Interventional Cardiologist  [facility]  Pager # [phone] Phone # [phone] [address] [place], [place] [address]

## 2023-05-21 NOTE — Interval H&P Note (Signed)
History and Physical Interval Note:  05/21/2023 10:45 AM  John Dodson  has presented today for surgery, with the diagnosis of Class IV Angina - history of cabg.  The various methods of treatment have been discussed with the patient and family. After consideration of risks, benefits and other options for treatment, the patient has consented to  Procedure(s): CORONARY STENT INTERVENTION (N/A)    as a surgical intervention.  The patient's history has been reviewed, patient examined, no change in status, stable for surgery.  I have reviewed the patient's chart and labs.  Questions were answered to the patient's satisfaction.    Cath Lab Visit (complete for each Cath Lab visit)  Clinical Evaluation Leading to the Procedure:   ACS: No.  Non-ACS:    Anginal Classification: CCS IV  Anti-ischemic medical therapy: Maximal Therapy (2 or more classes of medications)  Non-Invasive Test Results: No non-invasive testing performed -> cardiac catheterization showed occluded SVG-OM and existing ostial LCx stenosis with failed medical therapy.  Prior CABG: Previous CABG    Bryan Lemma

## 2023-05-21 NOTE — Progress Notes (Signed)
Entered pt's room in procedural short stay and was alerted by RN of forearm hematoma. Hematoma reached from just proximal to the TR band to the bend of elbow. MD and supervisor alerted immediately and manual BP cuff placed over hematoma and inflated as ordered. Berlin Hun, RN and myself readjusted TR band to a more proximal position. No external hemorrhaging noted. BP cuff remained in place and short stay RN advised to call with any questions or concerns. No other concerns present.

## 2023-05-21 NOTE — Progress Notes (Signed)
CARDIAC REHAB PHASE I     Post stent education including site care, restrictions, risk factors, exercise guidelines, NTG use, antiplatelet therapy importance, heart healthy diabetic diet  and CRP2 reviewed. All questions and concerns addressed. Will refer to [facility] for CRP2. Plan for home later today.   [id number]  Woodroe Chen, RN BSN 05/21/2023 1:27 PM

## 2023-05-21 NOTE — Progress Notes (Addendum)
Patient noticed to have a hematoma above the right radial TR band, BP cuff protocol started at 1230 per order. Md aware. Taken off at 1348, then restarted per MD orders. MD notified and is aware

## 2023-05-22 ENCOUNTER — Ambulatory Visit: Payer: Medicare Other | Admitting: Cardiology

## 2023-05-22 ENCOUNTER — Encounter (HOSPITAL_COMMUNITY): Payer: Self-pay | Admitting: Cardiology

## 2023-05-23 MED FILL — Nitroglycerin IV Soln 100 MCG/ML in D5W: INTRA_ARTERIAL | Qty: 10 | Status: AC

## 2023-05-26 ENCOUNTER — Telehealth (HOSPITAL_COMMUNITY): Payer: Self-pay

## 2023-05-26 NOTE — Telephone Encounter (Signed)
Phase II referral for Cardiac Rehab faxed to [facility]

## 2023-06-08 NOTE — Progress Notes (Signed)
Cardiology Office Note    Date:  06/10/2023  ID:  John Dodson, DOB 1961-01-03, MRN [id number] PCP:  Andreas Blower., MD  Cardiologist:  Bryan Lemma, MD  Electrophysiologist:  Lewayne Bunting, MD   Chief Complaint: Follow up for CAD   History of Present Illness: .    John Dodson is a 63 y.o. male with visit-pertinent history of CAD s/p multiple prior stents, CABG x 4 in 07/2019 with LIMA to distal LAD, sequential left radial to OM 2 to PDA, RIMA to ramus, complete heart block s/p biventricular PPM in 06/2016, palpitations, hypertension, hyperlipidemia, PE following COVID-19 infection, CKD stage III and type 2 diabetes.   Cardiac catheterization prior to bypass surgery in 07/2019 showed EF 55 to 65%, 60% ostial to proximal left circumflex lesion, 60% mid to distal LAD lesion, widely patent OM2 stent, 80% lateral second marginal lesion, widely patent stent in the first LPL, 50% RPA V lesion.  Echocardiogram in 2021 showed EF 55 to 60%, normal LV function.  Lexiscan in 09/2021 for chest pain was low risk, EF 62%.  At that time it was felt that his chest discomfort was related to incision/scar pain.  In January 2024 he continued to note intermittent chest discomfort, predominantly at rest though he did report some exertional symptoms.  He was started on Ranexa 500 mg twice daily with improvement. He was seen in clinic on 01/02/2023, he had done well from a cardiac standpoint.  He noted occasional twinges in his chest, relieved with burping felt to be related to indigestion.   On 04/25/2023 he presented to the emergency room with chest pain.  He reported having substernal chest discomfort radiating down the left arm and also left jaw.  Discomfort occurred at rest and resolved after a second nitroglycerin.  He also reported a separate right substernal chest discomfort that was worse with palpation and deep inspiration.  He had reoccurrence of chest discomfort the following morning when he was up  ambulating. Creatinine 1.71, troponin 7>7.  Checks x-ray showed no acute findings.  His chest discomfort however is reminiscent of previous angina.  On 04/28/2023 he underwent cardiac catheterization, there was noted progression of native CAD with a now occluded RCA stent as well as mid LAD at LIMA insertion site.  There is widely patent pedicled LIMA to LAD and pedicle RIMA to LDPA with flush occlusion of sequential left radial, not felt to be acute graft failure.  His Ranexa was increased to 1000 mg twice daily with recommendation to reassess for symptom improvement over the next 2 to 4 weeks, if unable to control symptoms recommended return for staged PCI of the left main to ostial LCx.  At office visit on 05/05/2023 he reported that he continued to have nearly daily chest discomfort, worsened by exertion.  He was able to take sublingual nitroglycerin with improvement in symptoms.  He was unable to tolerate increased dose of Ranexa due to dizziness and lightheadedness, his Ranexa was decreased back to 500 mg twice daily and he was started on amlodipine 5 mg daily.  His losartan was held for hyperkalemia.  He was seen in follow-up on 05/13/2023, he noted that he continued to have chest discomfort almost daily.  He was able to rest with improvement but had required sublingual nitroglycerin as well.  His dizziness and lightheadedness had improved with decreased Ranexa.  Discussed with Dr. Herbie Baltimore who recommended staged PCI of the LM 2 oLCx.  On 05/21/2023 patient underwent angioplasty was  performed n the main branch and sidebranch, a DES was successfully placed from the ostial left main through the proximal LCx with a Onyx frontier 4.0 x 30 stent.  It was recommended that patient have uninterrupted dual antiplatelet therapy with aspirin and clopidogrel for a minimum of 6 months then continue Plavix monotherapy for lifelong maintenance therapy.  While in Short stay patient developed a hematoma immediately above the TR  band.  BP cuff protocol was used.  Site was reevaluated prior to discharge and found to be stable without any complications.  Dr. Sherald Hess from vascular surgery and Dr. Herbie Baltimore both reviewed the site, there is no evidence of compartment syndrome.  Today he presents for follow up. He report that he is doing very well.  He denies any further chest pain or shortness of breath.  His cath site is healing well, he notes the swelling has gone down and he no longer has any pain in the arm.  He denies pain, numbness or tingling in the extremity.  He reports that he has been able to return to his normal activity, he is able to climb flights of stairs without any angina.  He notes some occasional slight dizziness with position changes.  Labwork independently reviewed: 05/13/2023: Sodium 141, potassium 4.8, creatinine 1.74, hemoglobin 16.1, hematocrit 49.2 ROS: .   Today he denies chest pain, shortness of breath, lower extremity edema, fatigue, palpitations, melena, hematuria, hemoptysis, diaphoresis, weakness, presyncope, syncope, orthopnea, and PND.  All other systems are reviewed and otherwise negative. Studies Reviewed: Marland Kitchen    EKG:  EKG is ordered today, personally reviewed, demonstrating  EKG Interpretation Date/Time:  Tuesday June 10 2023 13:47:55 EST Ventricular Rate:  64 PR Interval:  150 QRS Duration:  154 QT Interval:  484 QTC Calculation: 499 R Axis:   234  Text Interpretation: ATRIAL PACED RHYTHM Possible Left atrial enlargement Right bundle branch block Possible Anterolateral infarct (cited on or before 10-Jun-2023) When compared with ECG of 21-May-2023 12:05, Right bundle branch block has replaced Non-specific intra-ventricular conduction block Confirmed by Reather Littler [id number]) on 06/10/2023 2:01:52 PM    CV Studies:  Cardiac Studies & Procedures   ______________________________________________________________________________________________ CARDIAC CATHETERIZATION  CARDIAC  CATHETERIZATION 05/21/2023  Narrative   Mid LM to Ost LAD lesion is 50% stenosed with 80% stenosed side branch in Ost Cx to Prox Cx.  Prox Cx to Mid Cx lesion is 45% stenosed.  TIMI-3 flow   ScoreFlex angioplasty was performed (using sequential balloons from 3.0 mm standard balloon to 3.5 and 4.0 mm [place] scoring balloons) in the main branch and side branch.   A drug-eluting stent was successfully placed from Ostial Left Main through the proximal LCx 45% lesion using a STENT ONYX FRONTIER 4.0X30.  Postdilated to 4.6 mm.   Post intervention, there is a 0% residual stenosis throughout the stented segment in the LM and LCx.  TIMI-3 flow maintained   Ost LAD to Prox LAD lesion is 60% stenosed -stable post PCI; Mid LAD to Dist LAD lesion is 100% stenosed.   ------------------------------   Lat 2nd Mrg lesion is 80% stenosed.   LPAV lesion is 50% stenosed.  LPDA lesion is 5% stenosed.   1st LPL & 2nd Mrg stents are widely patent   ------------------------------   LV end diastolic pressure is normal.   There is no aortic valve stenosis.  Successful Left Main-Ostial to Proximal LCx DES PCI with score flex PTCA pretreatment using a Onyx Frontier DES 4.0 mm x 30 mm postdilated  to 4.6 mm. 80% lesion reduced to 0% with TIMI-3 flow. Sidebranch flow in the LAD maintained. LVEDP 16 mmHg.  Okay for post cath hydration  RECOMMENDATION   In the absence of any other complications or medical issues, we expect the patient to be ready for discharge from an interventional cardiology perspective on 05/21/2023.    Will allow for 8 hours post cath hydration given his creatinine of 1.7.   Recommend uninterrupted dual antiplatelet therapy with Aspirin 81mg  daily and Clopidogrel 75mg  daily for a minimum of 6 months (stable ischemic heart disease-Class I recommendation).   With then continue Plavix monotherapy (SAPT) for lifelong maintenance therapy.    Bryan Lemma, MD  Findings Coronary Findings Diagnostic   Dominance: Left  Left Main Vessel was injected. Vessel is large. Mid LM to Ost LAD lesion is 50% stenosed with 80% stenosed side branch in Ost Cx to Prox Cx. Vessel is the culprit lesion. The lesion is type C, located at the bifurcation, concentric and irregular. Fibrotic The lesion was not previously treated . Previously grafted  Left Anterior Descending Ost LAD to Prox LAD lesion is 60% stenosed. Prox LAD to Mid LAD lesion is 10% stenosed. The lesion was previously treated using a drug eluting stent over 2 years ago. At least 2 stents Mid LAD to Dist LAD lesion is 100% stenosed. The lesion is distal to major branch, segmental, eccentric and irregular. Highly significant DFR  Third Diagonal Branch Vessel is small in size.  Left Circumflex Vessel is large. Prox Cx to Mid Cx lesion is 45% stenosed.  Second Obtuse Marginal Branch Non-stenotic 2nd Mrg lesion was previously treated.  Lateral Second Obtuse Marginal Branch Vessel is small in size. Considered to be a small OM by prior cath films.  Difficult to determine if it sidebranch or true OM Lat 2nd Mrg lesion is 80% stenosed. The lesion is focal. Previously described a 75 to 80%  Left Posterior Descending Artery Vessel is large in size. LPDA lesion is 5% stenosed. The lesion was previously treated using a drug eluting stent over 2 years ago. Several overlapping stents  First Left Posterolateral Branch Vessel is large in size. Non-stenotic 1st LPL lesion was previously treated.  Left Posterior Atrioventricular Artery Vessel is large in size. LPAV lesion is 50% stenosed. The lesion is located at the bend.  Right Coronary Artery Vessel was not injected. Vessel is small. Ost RCA to Mid RCA lesion is 100% stenosed. Diffuse The lesion was previously treated using a drug eluting stent over 2 years ago. At least 2 if not 3 overlapping DES stents Previously placed stent displays restenosis.  Acute Marginal Branch Vessel is small in  size.  Right Ventricular Branch Vessel is small in size.  Right Posterior Descending Artery Vessel is small in size.  RIMA RIMA Graft To LPDA RIMA graft was not injected and is moderate in size.  The graft exhibits no disease. RIMA-PDA  LIMA LIMA Graft To Dist LAD LIMA graft was not injected and is normal in caliber.  LIMA-LAD  Sequential Left Radial Artery Graft To Lat 2nd Mrg, 2nd Mrg Left radial artery graft was not injected.  The graft exhibits severe . SeqLRAD-OM1-LPL Origin to Prox Graft lesion before Lat 2nd Mrg  is 100% stenosed.  Intervention  Mid LM to Ost LAD lesion with side branch in Ost Cx to Prox Cx Provisional - Main and Side Branches (Also treats lesions: Prox Cx to Mid Cx) Provisional stenting was successfully performed in the main branch  from the [place] LM to the [place] LAD and in the side branch from the [place] Cx to the Mid Cx. Pre-stent angioplasty was performed. A stent was successfully placed in the main branch and side branch. LAD lesion not treated Post-stent angioplasty was performed in the main branch and side branch. A BALLN SCOREFLEX 4.0X15 scoring balloon was used in the main branch. Maximum pressure: 14 atm. Inflation time: 30 sec. Post-Intervention Lesion Assessment The intervention was successful. Pre-interventional TIMI flow is 3. Post-intervention TIMI flow is 3. Treated lesion length:  30 mm. No complications occurred at this lesion. There is a 0% residual stenosis in the main branch post intervention. There is a 0% residual stenosis in the side branch post intervention.  Prox Cx to Mid Cx lesion Provisional (Also treats lesions: Mid LM to [facility] LAD with side branch in Ost Cx to Prox Cx) Provisional stenting was successfully performed in the main branch from the Ost LM to the [place] LAD and in the side branch from the [place] Cx to the Mid Cx. Pre-stent angioplasty was performed. A stent was successfully placed in the main branch and side branch. LAD lesion not  treated Post-stent angioplasty was performed in the main branch and side branch. A BALLN SCOREFLEX 4.0X15 scoring balloon was used in the main branch. Maximum pressure: 14 atm. Inflation time: 30 sec. Post-Intervention Lesion Assessment The intervention was successful. Pre-interventional TIMI flow is 3. Post-intervention TIMI flow is 3. Treated lesion length:  30 mm. No complications occurred at this lesion. There is a 0% residual stenosis post intervention.   CARDIAC CATHETERIZATION  CARDIAC CATHETERIZATION 04/28/2023  Narrative   Ost LAD to Mid LAD lesion is 10% stenosed. Mid LAD to Dist LAD lesion is 100% stenosed.   Ost Cx to Prox Cx lesion is 65% stenosed.->  Previously evaluated prior to CABG as FFR positive   LPAV lesion is 50% stenosed.   Lat 2nd Mrg lesion is 80% stenosed.   Previously placed LPDA stent is 5% stenosed.   Ost RCA to Mid RCA STENT is 100% stenosed.   Previously placed 2nd Mrg stent of unknown type is  widely patent.   Previously placed 1st LPL stent of unknown type is  widely patent.   GRAFTS.   RIMA-PDA graft was visualized by angiography and is moderate in size. The graft exhibits no disease.   LIMA-LAD graft was visualized by angiography and is normal in caliber. The graft exhibits no disease.   Seq LRAD-OM1*LPL1(OM2) graft was injected, but not not visualized due to occlusion. Origin to Prox Graft lesion before Lat 2nd Mrg is 100% stenosed. ->  Does not appear to to be freshly occluded especially in light of negative troponins.   LV end diastolic pressure is mildly elevated.  FINDINGS SUMMARY Progression of native CAD: Now occluded RCA stent as well as mid LAD at LIMA insertion site. Widely patent pedicled LIMA-LAD and pedicle RIMA-LPDA with flush occlusion of sequential Left Radial - does not appear to be acute graft failure. Mildly Elevated LVEDP ~18 mmHg   RECOMMENDATIONS Post cath hydration an hour for 8 hours.  Closely monitor renal function Titrate  medical management with increasing Ranexa to 1000 mg twice daily -> reassess for symptom improvement over the next 2 to 4 weeks to determine if symptoms have improved, if not able to control symptoms, would return for staged PCI of the Left Main-ostial LCx.    Bryan Lemma, MD  Findings Coronary Findings Diagnostic  Dominance: Left  Left Main  Vessel is large.  Left Anterior Descending Ost LAD to Mid LAD lesion is 10% stenosed. The lesion was previously treated using a drug eluting stent over 2 years ago. At least 2 stents Mid LAD to Dist LAD lesion is 100% stenosed. The lesion is distal to major branch, segmental, eccentric and irregular. Highly significant DFR  Third Diagonal Branch Vessel is small in size.  Left Circumflex Vessel is large. Ost Cx to Prox Cx lesion is 65% stenosed. The lesion is focal. Angiographically does not seem to be significant, however highly significant by DFR  Second Obtuse Marginal Branch Previously placed 2nd Mrg stent of unknown type is  widely patent.  Lateral Second Obtuse Marginal Branch Vessel is small in size. Considered to be a small OM by prior cath films.  Difficult to determine if it sidebranch or true OM Lat 2nd Mrg lesion is 80% stenosed. The lesion is focal. Previously described a 75 to 80%  Left Posterior Descending Artery Vessel is large in size. LPDA lesion is 5% stenosed. The lesion was previously treated using a drug eluting stent over 2 years ago. Several overlapping stents  First Left Posterolateral Branch Vessel is large in size. Previously placed 1st LPL stent of unknown type is  widely patent.  Left Posterior Atrioventricular Artery Vessel is large in size. LPAV lesion is 50% stenosed. The lesion is located at the bend.  Right Coronary Artery Vessel was injected. Vessel is small. There is severe diffuse disease throughout the vessel. Ost RCA to Mid RCA lesion is 100% stenosed. Diffuse The lesion was previously treated  using a drug eluting stent over 2 years ago. At least 2 if not 3 overlapping DES stents Previously placed stent displays restenosis.  Acute Marginal Branch Vessel is small in size.  Right Ventricular Branch Vessel is small in size.  Right Posterior Descending Artery Vessel is small in size.  RIMA RIMA Graft To LPDA RIMA graft was visualized by angiography and is moderate in size.  The graft exhibits no disease. RIMA-PDA  LIMA LIMA Graft To Dist LAD LIMA graft was visualized by angiography and is normal in caliber.  LIMA-LAD  Sequential Left Radial Artery Graft To Lat 2nd Mrg, 2nd Mrg Left radial artery graft was not visualized due to known occlusion.  Injected - but not visualized The graft exhibits severe . SeqLRAD-OM1-LPL Origin to Prox Graft lesion before Lat 2nd Mrg  is 100% stenosed.  Intervention  No interventions have been documented.   STRESS TESTS  MYOCARDIAL PERFUSION IMAGING 10/03/2021  Narrative   The study is normal. The study is low risk.   No ST deviation was noted.   LV perfusion is normal.   Left ventricular function is normal. Nuclear stress EF: 62 %. The left ventricular ejection fraction is normal (55-65%). End diastolic cavity size is normal.   Prior study available for comparison from 08/20/2018.  Low risk stress nuclear study with normal perfusion and normal left ventricular regional and global systolic function.   ECHOCARDIOGRAM  ECHOCARDIOGRAM LIMITED 08/27/2019  Narrative ECHOCARDIOGRAM LIMITED REPORT    Patient Name:   John Dodson Date of Exam: 08/27/2019 Medical Rec #:  [id number]           Height:       68.0 in Accession #:    [id number]          Weight:       225.7 lb Date of Birth:  November 27, 1960          BSA:  2.152 m Patient Age:    58 years            BP:           104/82 mmHg Patient Gender: M                   HR:           92 bpm. Exam Location:  Inpatient  Procedure: Limited Echo  STAT ECHO  Indications:     Tamponade 423.3  History:        Patient has prior history of Echocardiogram examinations, most recent 08/25/2019. CAD, Prior CABG, Signs/Symptoms:Chest Pain; Risk Factors:Hypertension, Diabetes, Dyslipidemia and Non-Smoker. Pulmonary embolism.  Sonographer:    Renella Cunas RDCS Referring Phys: [id number] [facility] Z ATKINS   Sonographer Comments: Technically difficult study due to poor echo windows. IMPRESSIONS   1. No significant pericardial effusion noted. Likely prominent prominent epicardial fat pad. Given limited views - EF appears normal RV is normal size.  FINDINGS Pericardium: No significant pericardial effusion noted. Likely prominent prominent epicardial fat pad. Given limited views - EF appears normal RV is normal size.  Charlton Haws MD Electronically signed by Charlton Haws MD Signature Date/Time: 08/27/2019/8:15:20 AM    Final   TEE  ECHO INTRAOPERATIVE TEE 08/25/2019  Narrative *INTRAOPERATIVE TRANSESOPHAGEAL REPORT *    Patient Name:   John Dodson Date of Exam: 08/25/2019 Medical Rec #:  [id number]           Height:       68.0 in Accession #:    [id number]          Weight:       208.2 lb Date of Birth:  April 29, 1961          BSA:          2.08 m Patient Age:    58 years            BP:           134/75 mmHg Patient Gender: M                   HR:           77 bpm. Exam Location:  Anesthesiology  Transesophogeal exam was perform intraoperatively during surgical procedure. Patient was closely monitored under general anesthesia during the entirety of examination.  Indications:     CABG Sonographer:     Celene Skeen RDCS (AE) Performing Phys: [id number] [facility] Z ATKINS  Complications: No known complications during this procedure. POST-OP IMPRESSIONS - Left Ventricle: The left ventricle is unchanged from pre-bypass. - Right Ventricle: The right ventricle appears unchanged from pre-bypass. - Aorta: The aorta appears unchanged from pre-bypass. - Left  Atrium: The left atrium appears unchanged from pre-bypass. - Left Atrial Appendage: The left atrial appendage appears unchanged from pre-bypass. - Aortic Valve: The aortic valve appears unchanged from pre-bypass. - Mitral Valve: The mitral valve appears unchanged from pre-bypass. - Tricuspid Valve: There is mild regurgitation. - Interatrial Septum: The interatrial septum appears unchanged from pre-bypass. - Interventricular Septum: The interventricular septum appears unchanged from pre-bypass. - Pericardium: The pericardium appears unchanged from pre-bypass.  PRE-OP FINDINGS Left Ventricle: The left ventricle has normal systolic function, with an ejection fraction of 55-60%. The cavity size was normal. There is mild concentric left ventricular hypertrophy.  Right Ventricle: The right ventricle has normal systolic function. The cavity was normal. There is no increase in right ventricular wall thickness.  Left Atrium: Left  atrial size was normal in size.  Right Atrium: Right atrial size was normal in size. Right atrial pressure is estimated at 10 mmHg.  Interatrial Septum: No atrial level shunt detected by color flow Doppler.  Pericardium: There is no evidence of pericardial effusion.  Mitral Valve: The mitral valve is normal in structure. Mitral valve regurgitation is trivial by color flow Doppler.  Tricuspid Valve: The tricuspid valve was normal in structure. Tricuspid valve regurgitation is trivial by color flow Doppler.  Aortic Valve: The aortic valve is normal in structure. Aortic valve regurgitation was not visualized by color flow Doppler. There is no stenosis of the aortic valve, with a calculated valve area of 1.70 cm.  Pulmonic Valve: The pulmonic valve was normal in structure. Pulmonic valve regurgitation is not visualized by color flow Doppler.   Aorta: The aortic root, ascending aorta and aortic arch are normal in size and structure. There is evidence of plaque in the  descending aorta.  +--------------+--------++ LEFT VENTRICLE         +--------------+--------++ PLAX 2D                +--------------+--------++ LVOT diam:    1.90 cm  +--------------+--------++ LVOT Area:    2.84 cm +--------------+--------++                        +--------------+--------++  +------------------+------------++ AORTIC VALVE                   +------------------+------------++ AV Area (Vmax):   1.58 cm     +------------------+------------++ AV Area (Vmean):  1.59 cm     +------------------+------------++ AV Area (VTI):    1.70 cm     +------------------+------------++ AV Vmax:          147.00 cm/s  +------------------+------------++ AV Vmean:         101.000 cm/s +------------------+------------++ AV VTI:           0.286 m      +------------------+------------++ AV Peak Grad:     8.6 mmHg     +------------------+------------++ AV Mean Grad:     5.0 mmHg     +------------------+------------++ LVOT Vmax:        82.00 cm/s   +------------------+------------++ LVOT Vmean:       56.800 cm/s  +------------------+------------++ LVOT VTI:         0.171 m      +------------------+------------++ LVOT/AV VTI ratio:0.60         +------------------+------------++   +--------------+-------+ SHUNTS                +--------------+-------+ Systemic VTI: 0.17 m  +--------------+-------+ Systemic Diam:1.90 cm +--------------+-------+   Marcene Duos MD Electronically signed by Marcene Duos MD Signature Date/Time: 08/26/2019/9:42:49 PM    Final  MONITORS  LONG TERM MONITOR (3-14 DAYS) 06/13/2021  Narrative  Underlying predominant rhythm is Sinus Rhythm with an average rate of 84 bpm and a rate range of 60 to 118 bpm  Very rare PACs and PVCs noted.  There are also noted appropriate pacing as well as occasional episodes where there is pacing with intrinsic beats  also noted.   Patch Wear Time:  14 days and 0 hours (2023-01-23T07:23:41-0500 to 2023-02-06T07:23:45-0500)   Overall, pretty normal monitor.  Will need EP to assess pacemaker to see if there is any true arrhythmias.  Bryan Lemma, MD       ______________________________________________________________________________________________        Current Reported Medications:.  Current Meds  Medication Sig   albuterol (VENTOLIN HFA) 108 (90 Base) MCG/ACT inhaler Inhale 2 puffs into the lungs every 4 (four) hours as needed for wheezing or shortness of breath.   allopurinol (ZYLOPRIM) 300 MG tablet Take 300 mg by mouth daily.   amLODipine (NORVASC) 2.5 MG tablet Take 1 tablet (2.5 mg total) by mouth daily.   aspirin EC 81 MG tablet Take 1 tablet (81 mg total) by mouth daily. Swallow whole.   atorvastatin (LIPITOR) 80 MG tablet TAKE 1 TABLET BY MOUTH DAILY (Patient taking differently: Take 80 mg by mouth every evening.)   Capsicum, Cayenne, (CAYENNE PEPPER PO) Take 1 capsule by mouth in the morning and at bedtime.   carvedilol (COREG) 12.5 MG tablet Take 1 tablet (12.5 mg total) by mouth 2 (two) times daily with a meal.   clopidogrel (PLAVIX) 75 MG tablet TAKE 1 TABLET BY MOUTH DAILY   COLLAGEN PO Take 2 tablets by mouth in the morning and at bedtime.   Continuous Glucose Receiver (FREESTYLE LIBRE 2 READER) DEVI 1 each by miscellaneous route continuous.   Evolocumab (REPATHA SURECLICK) 140 MG/ML SOAJ Inject 140 mg into the skin every 14 (fourteen) days.   furosemide (LASIX) 40 MG tablet Take 1 tablet (40 mg total) by mouth daily as needed for fluid or edema (Poor more than 3 pound weight gain overnight or more than 5 pound weight gain over baseline).   gabapentin (NEURONTIN) 100 MG capsule Take 200 mg by mouth at bedtime.   insulin glargine (LANTUS SOLOSTAR) 100 UNIT/ML Solostar Pen Inject 50 Units into the skin every evening. 100UNITS/3 ML   JARDIANCE 25 MG TABS tablet Take 25 mg by  mouth daily.   losartan (COZAAR) 25 MG tablet Take 12.5 mg by mouth daily.   Magnesium Oxide -Mg Supplement ([employer] MAGNESIUM) 500 MG TABS Take 500 mg by mouth daily.   nitroGLYCERIN (NITROSTAT) 0.4 MG SL tablet DISSOLVE 1 TABLET UNDER THE  TONGUE EVERY 5 MINUTES AS NEEDED FOR CHEST PAIN. MAX OF 3 TABLETS IN 15 MINUTES. CALL 911 IF PAIN  PERSISTS.   Omega-3 Fatty Acids (FISH OIL BURP-LESS) 1000 MG CAPS Take 2,000 mg by mouth daily.   pantoprazole (PROTONIX) 40 MG tablet Take 40 mg by mouth 2 (two) times daily.   predniSONE (DELTASONE) 50 MG tablet Take 1 tablet 13 hours prior to procedure, 1 tablet 7 hours prior to procedure, and 1 tablet (with Benedryl 50 mg) prior to going to the hospital for procedure   ranolazine (RANEXA) 500 MG 12 hr tablet Take 1 tablet (500 mg total) by mouth 2 (two) times daily.   Semaglutide, 1 MG/DOSE, (OZEMPIC, 1 MG/DOSE,) 2 MG/1.5ML SOPN Inject 1 mg into the skin every Tuesday.   Vitamin D, Cholecalciferol, 25 MCG (1000 UT) CAPS Take 1,000 Units by mouth at bedtime.   Physical Exam:    VS:  BP 112/62 (BP Location: Right Arm, Patient Position: Sitting, Cuff Size: Large)   Pulse 64   Ht 5\' 8"  (1.727 m)   Wt 202 lb 6.4 oz (91.8 kg)   SpO2 98%   BMI 30.77 kg/m    Wt Readings from Last 3 Encounters:  06/10/23 202 lb 6.4 oz (91.8 kg)  05/21/23 194 lb (88 kg)  05/13/23 198 lb (89.8 kg)    GEN: Well nourished, well developed in no acute distress NECK: No JVD; No carotid bruits CARDIAC: RRR, no murmurs, rubs, gallops, right ulnar cath site clean and intact RESPIRATORY:  Clear to auscultation  without rales, wheezing or rhonchi  ABDOMEN: Soft, non-tender, non-distended EXTREMITIES:  No edema; No acute deformity   Asessement and Plan:.    CAD: S/p CABG x 4 with LIMA to distal LAD, sequential left radial to OM 2 to PDA, RIMA to ramus.  Previously did not tolerate Imdur for severe headache.  Presented 12/27 with chest pain similar to prior angina, high sensitive troponin  negative x 2.  Underwent cardiac cath on 12/30 noted above with patent LIMA to LAD, RIMA to LPDA/occlusion of sequential left radial noted not appear to be acute graft failure.  Occluded RCA stent as well as an LAD at LIMA insertion site.  On follow-up patient was reported unable to tolerate Ranexa continued having anginal equivalent.  Patient underwent LHC with DES successfully placed from the ostial left main through the proximal LCx with Onyx frontier 4.0 x 30 stent.  While in short stay he developed a hematoma, reviewed by Dr. Chestine Spore and Dr. Herbie Baltimore with no evidence of compartment syndrome. Today patient reports that he is doing very well.  He denies any further chest pain or shortness of breath.  His right ulnar site is healing well, there is no evidence of edema, right radial pulse is present, there is no evidence of current hematoma.  He denies any pain, numbness or tingling. Heart healthy diet and regular cardiovascular exercise encouraged.  Reviewed ED precautions.  Given some minor orthostasis will decrease amlodipine back to 2.5 mg daily.  He will continue to monitor his blood pressure at home and notify if no improvement. Continue atorvastatin 80 mg daily, carvedilol 12.5 mg twice daily, Plavix 75 mg daily, Repatha, Lasix 40 mg daily as needed for weight gain, losartan 12.5 mg daily, Ranexa 500 mg twice daily and sublingual nitroglycerin as needed for chest pain. Check CBC and BMET. Check echocardiogram.   CHB s/p Biv PPM in 2018: Stable on last interrogation on 03/15/23. Followed by EP.  Hypertension: Blood pressure today 112/62.  Patient notes some slight dizziness with position changes, request amlodipine reduction back to 2.5 mg daily.  Encouraged patient to monitor his blood pressure at home and notify the office if dizziness persist or if blood pressure is consistently elevated above 130/80. Otherwise continue current antihypertensive regimen.  Hyperlipidemia: Last lipid profile on 04/10/2023  indicated total cholesterol 87, HDL 40, triglycerides 135 and LDL 24.  Continue atorvastatin and Repatha.  Type II DM: Last hemoglobin A1c 6.6 on 02/11/2023.  This been monitored and managed per his PCP.  He is on Jardiance and Lantus.  CKD stage III: Last creatinine 1.74 on 05/13/23. Followed by nephrology. Check BMET today.    Disposition: F/u with Dr. Herbie Baltimore in 2-3 months or sooner if needed (pt request).   Signed, Rip Harbour, NP

## 2023-06-10 ENCOUNTER — Ambulatory Visit: Payer: Medicare Other | Attending: Cardiology | Admitting: Cardiology

## 2023-06-10 ENCOUNTER — Encounter: Payer: Self-pay | Admitting: Cardiology

## 2023-06-10 VITALS — BP 112/62 | HR 64 | Ht 68.0 in | Wt 202.4 lb

## 2023-06-10 DIAGNOSIS — N183 Chronic kidney disease, stage 3 unspecified: Secondary | ICD-10-CM

## 2023-06-10 DIAGNOSIS — I442 Atrioventricular block, complete: Secondary | ICD-10-CM

## 2023-06-10 DIAGNOSIS — Z955 Presence of coronary angioplasty implant and graft: Secondary | ICD-10-CM | POA: Diagnosis not present

## 2023-06-10 DIAGNOSIS — Z951 Presence of aortocoronary bypass graft: Secondary | ICD-10-CM

## 2023-06-10 DIAGNOSIS — I10 Essential (primary) hypertension: Secondary | ICD-10-CM

## 2023-06-10 DIAGNOSIS — E785 Hyperlipidemia, unspecified: Secondary | ICD-10-CM

## 2023-06-10 DIAGNOSIS — E1169 Type 2 diabetes mellitus with other specified complication: Secondary | ICD-10-CM

## 2023-06-10 DIAGNOSIS — Z950 Presence of cardiac pacemaker: Secondary | ICD-10-CM

## 2023-06-10 DIAGNOSIS — I2510 Atherosclerotic heart disease of native coronary artery without angina pectoris: Secondary | ICD-10-CM | POA: Diagnosis not present

## 2023-06-10 DIAGNOSIS — I25119 Atherosclerotic heart disease of native coronary artery with unspecified angina pectoris: Secondary | ICD-10-CM

## 2023-06-10 NOTE — Patient Instructions (Signed)

## 2023-06-11 LAB — CBC
Hematocrit: 47.4 % (ref 37.5–51.0)
Hemoglobin: 15.6 g/dL (ref 13.0–17.7)
MCH: 30.5 pg (ref 26.6–33.0)
MCHC: 32.9 g/dL (ref 31.5–35.7)
MCV: 93 fL (ref 79–97)
Platelets: 130 10*3/uL — ABNORMAL LOW (ref 150–450)
RBC: 5.12 x10E6/uL (ref 4.14–5.80)
RDW: 13.3 % (ref 11.6–15.4)
WBC: 5.6 10*3/uL (ref 3.4–10.8)

## 2023-06-11 LAB — BASIC METABOLIC PANEL
BUN/Creatinine Ratio: 16 (ref 10–24)
BUN: 29 mg/dL — ABNORMAL HIGH (ref 8–27)
CO2: 24 mmol/L (ref 20–29)
Calcium: 9.8 mg/dL (ref 8.6–10.2)
Chloride: 101 mmol/L (ref 96–106)
Creatinine, Ser: 1.82 mg/dL — ABNORMAL HIGH (ref 0.76–1.27)
Glucose: 187 mg/dL — ABNORMAL HIGH (ref 70–99)
Potassium: 4.3 mmol/L (ref 3.5–5.2)
Sodium: 140 mmol/L (ref 134–144)
eGFR: 42 mL/min/{1.73_m2} — ABNORMAL LOW (ref >59)

## 2023-06-13 ENCOUNTER — Ambulatory Visit (INDEPENDENT_AMBULATORY_CARE_PROVIDER_SITE_OTHER): Payer: Medicare Other

## 2023-06-13 ENCOUNTER — Telehealth: Payer: Self-pay

## 2023-06-13 DIAGNOSIS — I442 Atrioventricular block, complete: Secondary | ICD-10-CM

## 2023-06-13 LAB — CUP PACEART REMOTE DEVICE CHECK
Battery Remaining Longevity: 15 mo
Battery Voltage: 2.8 V
Brady Statistic AP VP Percent: 11.14 %
Brady Statistic AP VS Percent: 0 %
Brady Statistic AS VP Percent: 88.86 %
Brady Statistic AS VS Percent: 0 %
Brady Statistic RA Percent Paced: 11.08 %
Brady Statistic RV Percent Paced: 100 %
Date Time Interrogation Session: 20250214012410
Implantable Lead Connection Status: 753985
Implantable Lead Connection Status: 753985
Implantable Lead Connection Status: 753985
Implantable Lead Implant Date: 20180315
Implantable Lead Implant Date: 20180315
Implantable Lead Implant Date: 20180315
Implantable Lead Location: 753858
Implantable Lead Location: 753859
Implantable Lead Location: 753860
Implantable Lead Model: 4076
Implantable Lead Model: 4298
Implantable Lead Model: 5076
Implantable Pulse Generator Implant Date: 20180315
Lead Channel Impedance Value: 266 Ohm
Lead Channel Impedance Value: 304 Ohm
Lead Channel Impedance Value: 380 Ohm
Lead Channel Impedance Value: 399 Ohm
Lead Channel Impedance Value: 399 Ohm
Lead Channel Impedance Value: 418 Ohm
Lead Channel Impedance Value: 475 Ohm
Lead Channel Impedance Value: 494 Ohm
Lead Channel Impedance Value: 494 Ohm
Lead Channel Impedance Value: 665 Ohm
Lead Channel Impedance Value: 665 Ohm
Lead Channel Impedance Value: 741 Ohm
Lead Channel Impedance Value: 760 Ohm
Lead Channel Impedance Value: 760 Ohm
Lead Channel Pacing Threshold Amplitude: 1.25 V
Lead Channel Pacing Threshold Amplitude: 1.375 V
Lead Channel Pacing Threshold Amplitude: 3 V
Lead Channel Pacing Threshold Pulse Width: 0.4 ms
Lead Channel Pacing Threshold Pulse Width: 0.4 ms
Lead Channel Pacing Threshold Pulse Width: 0.9 ms
Lead Channel Sensing Intrinsic Amplitude: 1 mV
Lead Channel Sensing Intrinsic Amplitude: 1 mV
Lead Channel Sensing Intrinsic Amplitude: 19.75 mV
Lead Channel Sensing Intrinsic Amplitude: 19.75 mV
Lead Channel Setting Pacing Amplitude: 2 V
Lead Channel Setting Pacing Amplitude: 2.5 V
Lead Channel Setting Pacing Amplitude: 2.5 V
Lead Channel Setting Pacing Pulse Width: 0.4 ms
Lead Channel Setting Pacing Pulse Width: 0.9 ms
Lead Channel Setting Sensing Sensitivity: 4 mV
Zone Setting Status: 755011
Zone Setting Status: 755011

## 2023-06-13 NOTE — Telephone Encounter (Signed)
-----   Message from [facility] sent at 06/11/2023  7:50 AM EST ----- Please let John Dodson know that his CBC shows no evidence of anemia or infection. His kidney function is close to baseline and his electrolytes are normal. Good results! Continue current medications.

## 2023-06-13 NOTE — Telephone Encounter (Signed)
Called patient advised of below they verbalized understanding.

## 2023-06-15 ENCOUNTER — Encounter: Payer: Self-pay | Admitting: Internal Medicine

## 2023-06-20 ENCOUNTER — Other Ambulatory Visit: Payer: Self-pay | Admitting: Cardiology

## 2023-06-20 DIAGNOSIS — Z955 Presence of coronary angioplasty implant and graft: Secondary | ICD-10-CM

## 2023-06-20 DIAGNOSIS — I442 Atrioventricular block, complete: Secondary | ICD-10-CM

## 2023-06-20 DIAGNOSIS — Z950 Presence of cardiac pacemaker: Secondary | ICD-10-CM

## 2023-06-20 DIAGNOSIS — I10 Essential (primary) hypertension: Secondary | ICD-10-CM

## 2023-06-20 DIAGNOSIS — Z951 Presence of aortocoronary bypass graft: Secondary | ICD-10-CM

## 2023-06-20 DIAGNOSIS — E1169 Type 2 diabetes mellitus with other specified complication: Secondary | ICD-10-CM

## 2023-06-20 DIAGNOSIS — N183 Chronic kidney disease, stage 3 unspecified: Secondary | ICD-10-CM

## 2023-06-20 DIAGNOSIS — I2510 Atherosclerotic heart disease of native coronary artery without angina pectoris: Secondary | ICD-10-CM

## 2023-07-01 ENCOUNTER — Ambulatory Visit (HOSPITAL_BASED_OUTPATIENT_CLINIC_OR_DEPARTMENT_OTHER)
Admission: RE | Admit: 2023-07-01 | Discharge: 2023-07-01 | Disposition: A | Discharge status: Home / Self Care | Payer: Medicare Other | Source: Ambulatory Visit | Attending: Cardiology | Admitting: Cardiology

## 2023-07-01 DIAGNOSIS — I2510 Atherosclerotic heart disease of native coronary artery without angina pectoris: Secondary | ICD-10-CM | POA: Insufficient documentation

## 2023-07-01 LAB — ECHOCARDIOGRAM COMPLETE
AR max vel: 2.15 cm2
AV Area VTI: 2.27 cm2
AV Area mean vel: 2.21 cm2
AV Mean grad: 3.5 mmHg
AV Peak grad: 5.4 mmHg
Ao pk vel: 1.17 m/s
Area-P 1/2: 3.63 cm2
Calc EF: 69 %
MV M vel: 1.56 m/s
MV Peak grad: 9.7 mmHg
S' Lateral: 2.8 cm
Single Plane A2C EF: 74.3 %
Single Plane A4C EF: 65.6 %

## 2023-07-01 MED ORDER — PERFLUTREN LIPID MICROSPHERE
1.0000 mL | INTRAVENOUS | Status: AC | PRN
  Administered 2023-07-01: 2 mL via INTRAVENOUS

## 2023-07-07 ENCOUNTER — Telehealth: Payer: Self-pay

## 2023-07-07 NOTE — Telephone Encounter (Signed)
 Called patient advised of below they verbalized understanding.

## 2023-07-07 NOTE — Telephone Encounter (Signed)
-----   Message from [facility] sent at 07/03/2023  5:27 PM EST ----- Please let Mr. Reznik know that his echocardiogram showed normal heart squeeze and function, there was some mild thickening and stiffening of the left lower chamber of the heart, this can occur with history of hypertension.  There were no significant valvular abnormalities.  Good result continue current medications and follow-up with Dr. Herbie Baltimore as planned.

## 2023-07-14 NOTE — Progress Notes (Signed)
 Remote pacemaker transmission.

## 2023-07-17 NOTE — Telephone Encounter (Signed)
 Spoke with patient who stated he is needing to have dental work done but had stent placement in January, will need clearance [facility] are to fax request   Will forward to preop team for review

## 2023-07-17 NOTE — Telephone Encounter (Signed)
 See other 3/20 mychart message

## 2023-07-21 ENCOUNTER — Telehealth: Payer: Self-pay | Admitting: *Deleted

## 2023-07-21 NOTE — Telephone Encounter (Signed)
   Primary Cardiologist: Bryan Lemma, MD  Chart reviewed as part of pre-operative protocol coverage. Simple dental extractions are considered low risk procedures per guidelines and generally do not require any specific cardiac clearance. It is also generally accepted that for simple extractions and dental cleanings, there is no need to interrupt blood thinner therapy. **Pt is not recommended to hold aspirin or Plavix until June 2025 due to progression of CAD noted on heart cath 04/28/23. He may proceed with dental procedure as long as it can be performed without holding his anti-platelet therapy.  SBE prophylaxis is not required for the patient.  I will route this recommendation to the requesting party via Epic fax function and remove from pre-op pool.  Please call with questions.  John Aland, NP-C  07/21/2023, 11:53 AM [address], [address] Office [phone] Fax [phone]

## 2023-07-21 NOTE — Telephone Encounter (Signed)
   Pre-operative Risk Assessment    Patient Name: John Dodson  DOB: December 22, 1960 MRN: [id number]   Date of last office visit: 06/10/23 Reather Littler, NP Date of next office visit: 09/02/23 DR. HARDING  Request for Surgical Clearance    Procedure:   DENTAL CLEANING-PERIO MAINTENANCE CLEANING (NOT QUITE A DEEP CLEANING BUT MORE INVASIVE THAN JUST A SIMPLE CLEANING PER WILLIAM AT THE [facility] OFFICE)   Date of Surgery:  Clearance TBD                                Surgeon:  DR. Desma Maxim, DMD Surgeon's Group or Practice Name:  [facility] Phone number:  [phone] Fax number:  [phone]   Type of Clearance Requested:   - Medical  - Pharmacy:  Hold Aspirin and Clopidogrel (Plavix)     Type of Anesthesia:  Local PER WILLIAM NOT PLANNED TO USE THOUGH THERE IS A POSSIBILITY    Additional requests/questions:    Elpidio Anis   07/21/2023, 11:21 AM

## 2023-07-24 ENCOUNTER — Other Ambulatory Visit: Payer: Self-pay | Admitting: Internal Medicine

## 2023-09-01 ENCOUNTER — Encounter (HOSPITAL_BASED_OUTPATIENT_CLINIC_OR_DEPARTMENT_OTHER): Payer: Self-pay

## 2023-09-02 ENCOUNTER — Ambulatory Visit: Payer: Medicare Other | Attending: Cardiology | Admitting: Cardiology

## 2023-09-02 ENCOUNTER — Encounter: Payer: Self-pay | Admitting: Cardiology

## 2023-09-02 VITALS — BP 124/68 | HR 82 | Ht 68.0 in | Wt 198.6 lb

## 2023-09-02 DIAGNOSIS — I214 Non-ST elevation (NSTEMI) myocardial infarction: Secondary | ICD-10-CM

## 2023-09-02 DIAGNOSIS — I10 Essential (primary) hypertension: Secondary | ICD-10-CM | POA: Diagnosis not present

## 2023-09-02 DIAGNOSIS — I2510 Atherosclerotic heart disease of native coronary artery without angina pectoris: Secondary | ICD-10-CM | POA: Diagnosis not present

## 2023-09-02 DIAGNOSIS — I25119 Atherosclerotic heart disease of native coronary artery with unspecified angina pectoris: Secondary | ICD-10-CM

## 2023-09-02 DIAGNOSIS — Z9861 Coronary angioplasty status: Secondary | ICD-10-CM

## 2023-09-02 DIAGNOSIS — E785 Hyperlipidemia, unspecified: Secondary | ICD-10-CM

## 2023-09-02 DIAGNOSIS — E1169 Type 2 diabetes mellitus with other specified complication: Secondary | ICD-10-CM

## 2023-09-02 NOTE — Patient Instructions (Signed)
 Medication Instructions:  No changes  *If you need a refill on your cardiac medications before your next appointment, please call your pharmacy*   Lab Work: Not needed    Testing/Procedures:  Not neeeded  Follow-Up: At [facility], you and your health needs are our priority.  As part of our continuing mission to provide you with exceptional heart care, we have created designated Provider Care Teams.  These Care Teams include your primary Cardiologist (physician) and Advanced Practice Providers (APPs -  Physician Assistants and Nurse Practitioners) who all work together to provide you with the care you need, when you need it.     Your next appointment:   5 or 6 month(s)  The format for your next appointment:   In Person  Provider:   Randene Bustard, MD

## 2023-09-02 NOTE — Progress Notes (Signed)
 Cardiology Office Note:  .   Date:  09/10/2023  ID:  John Dodson, DOB 1961/01/20, MRN [id number] PCP: John Dodson  [facility] Providers Cardiologist:  John Bustard, Dodson Electrophysiologist:  John Sells, Dodson     Chief Complaint  Patient presents with   Follow-up    3 months.  Doing well.   Coronary Artery Disease    No further angina after PCI    Patient Profile: .     John Dodson is a 63 y.o. male with a PMH reviewed below who presents here for 68-month follow-up at the request of John Dodson.  See detailed history from clinic note from January 2023-updates: Multivessel CAD-multiple stents followed by CABG x 4 in April 2021 (LIMA-dLAD, John Dodson, RIMA-RI) Diagnostic Cardiac Cath December 2024: => Occluded sequential SVG failed medical therapy (unable to tolerate Ranexa ) return for staged PCI January 2025 (see Notes below)-PCI to LM-LCx (4.0 x 30 LM-LCx) History of complete heart block-s/p PPM March 2018 HTN, HLD (on Repatha  and statin), DM-2 History of PE after COVID infection CKD stage III    John Dodson was last seen on February 07, 2023 by John Dodson for post cath-PCI follow-up.  Doing very well.  No further chest pain or dyspnea.  Cath site healing well.  Notably improved swelling and no more pain in the arm.  No further angina walking up stairs.  Echo ordered.  No medicine changes  Subjective  Discussed the use of AI scribe software for clinical note transcription with the patient, who gave verbal consent to proceed.  History of Present Illness History of Present Illness John Dodson is a 63 year old male with coronary artery disease and diabetes who presents for a six-month follow-up.  He has a history of coronary artery disease and underwent bypass surgery approximately five years ago. He underwent a staged PCI to the left main-LCx in January to improve negative blood flow, resulting in the resolution of  his previous chest pain. He has not experienced any chest pain since the procedure and notes an improvement in symptoms.  No more than baseline exertional dyspnea with no PND orthopnea.  His current medications for his heart condition include Norvasc  2.5 mg, carvedilol  12.5 mg twice a day, losartan  25 mg daily, and Ranexa  500 mg twice a day. He also takes aspirin  and Plavix  following his recent procedure.  For diabetes management, he is on Jardiance  25 mg and Ozempic. His blood sugar levels have been stable, although they occasionally wake him up at night. His last A1c was 6.8%.  He is on Repatha  for lipid management, which has significantly improved his cholesterol levels, with an LDL of 24 in December. He takes Lipitor  40 mg but is no longer on allopurinol due to issues with refilling the prescription.  He uses Lasix  as needed, approximately twice a week, when he feels swelling. No recent episodes of shortness of breath, heart palpitations, or dizziness, although he occasionally experiences mild dizziness. He denies any stroke-like symptoms, changes in vision, or pain in his legs when walking.    Objective   Pertinent Medications:  CV meds: Aspirin  81 John Dodson daily, Plavix  75 mg daily.  Amlodipine  2.5 mg daily, carvedilol  12.5 mg twice daily, losartan  12.5 mg daily; Ranexa  500 mg twice daily, Lipitor  one half of an 80 mg tab daily along with Repatha  under 40 mg every 2 weeks, Jardiance  25 mg daily, Ozempic 2 mg / 1.5 mL-1 mg  weekly; as needed Lasix  40 mg which she takes maybe twice a week DM medicines other than Jardiance  and Ozempic-Lantus  50 units every afternoon Neurontin 200 mg nightly, Protonix  40 mg daily as needed Zofran  Ashwagandha 2100 mg 2 Cap daily  Studies Reviewed: John Dodson   EKG Interpretation Date/Time:  Tuesday Sep 02 2023 09:52:21 EDT Ventricular Rate:  68 PR Interval:  146 QRS Duration:  160 QT Interval:  454 QTC Calculation: 482 R Axis:   229  Text Interpretation: Normal  sinus rhythm Atrial-sensed ventricular-paced rhythm When compared with ECG of 10-Jun-2023 13:47, No significant change was found Confirmed by John Dodson ([id number]) on 09/10/2023 12:36:52 AM    Lab Results  Component Value Date   NA 140 06/10/2023   K 4.3 06/10/2023   CREATININE 1.82 (H) 06/10/2023   EGFR 42 (L) 06/10/2023   GLUCOSE 187 (H) 06/10/2023   Lab Results  Component Value Date   CHOL 77 04/26/2023   HDL 39 (L) 04/26/2023   LDLCALC 24 04/26/2023   TRIG 208 (H) 04/26/2023   CHOLHDL 2.0 04/26/2023   Lab Results  Component Value Date   HGBA1C 7.8 (H) 11/24/2019   ECHO 07/01/2023:: Normal LVEF of 60 to 65%.  No RWMA.  Mild septal hypertrophy.  GR 1 DD.  Normal PAP and RAP.  Normal valves.  EKG shows paced rhythm versus bundle branch block CATH 04/28/2023: Progression of native disease-occluded RCA stent as well as mid LAD at LIMA insertion.  Widely patent pedicled LIMA to LAD and pedicled RIMA to LPDA with flush occlusion of sequential left radial. Staged PCI 05/21/2023: Successful Left Main-Ostial to Proximal LCx DES PCI with score flex PTCA pretreatment using a Onyx Frontier DES 4.0 mm x 30 mm postdilated to 4.6 mm. 80% lesion reduced to 0% with TIMI-3 flow. Sidebranch flow in the LAD maintained.  Diagnostic: Dominance: Left       Intervention   Risk Assessment/Calculations:        Physical Exam:   VS:  BP 124/68   Pulse 82   Ht 5\' 8"  (1.727 m)   Wt 198 lb 9.6 oz (90.1 kg)   SpO2 97%   BMI 30.20 kg/m    Wt Readings from Last 3 Encounters:  09/02/23 198 lb 9.6 oz (90.1 kg)  06/10/23 202 lb 6.4 oz (91.8 kg)  05/21/23 194 lb (88 kg)    GEN: Well nourished, well groomed in no acute distress; healthy appearing NECK: No JVD; No carotid bruits (Head - mild facial dyssymmetry) CARDIAC: Normal S1, S2; RRR, no murmurs, rubs, gallops RESPIRATORY:  Clear to auscultation without rales, wheezing or rhonchi ; nonlabored, good air movement. ABDOMEN: Soft, non-tender,  non-distended EXTREMITIES:  No edema; No deformity     ASSESSMENT AND PLAN: .    Problem List Items Addressed This Visit       Cardiology Problems   CAD S/P PCI; no further angina (Chronic)   Angina essentially resolved following left main to LCx stent placement after documented occlusion of the sequential graft to OM and PDA. Follow-up echo showed normal function. Small but nondominant RCA occluded but not significant. - Continue aspirin  and Plavix  for six months post-procedure (would be August 2025.) Then convert to Plavix  monotherapy for long-term management. For urgent procedures, after 3 months acceptable to consider holding aspirin  and Plavix  for urgent procedures, ideally avoid interruption x 6 months. As of August 2025, okay to hold Plavix  5 to 7 days preop for surgeries or procedures.  Coronary artery disease involving native coronary artery of native heart with angina pectoris (HCC) - Primary (Chronic)   No further angina following PCI to the LM-LCx. GDMT: -Continue combination of carvedilol  12.5 mg twice daily and amlodipine  2.5 mg daily along with Ranexa  for BP and antianginal benefit. - Continue losartan  25 mg for additional afterload reduction. - On combination of 40 mg Lipitor  plus Repatha  for lipids along with Ozempic and Jardiance . - Currently on DAPT (ASA/Plavix ) uninterrupted for 6 months-until August 2025      Essential hypertension (Chronic)   Blood pressure well-controlled on combination of Norvasc  2.5 mg daily, carvedilol  12.5 mg twice daily and losartan  25 mg daily. No complications. - Continue current antihypertensive medications.      Relevant Orders   EKG 12-Lead (Completed)   Hyperlipidemia associated with type 2 diabetes mellitus (HCC) (Chronic)   Managed with Repatha  140 mg every 2 weeks and Lipitor  40 mg.  LDL at 24, total cholesterol and HDL low. Effective management. - Continue current lipid-lowering therapy. - Reassess lipid levels at  next follow-up for potential Lipitor  adjustment.  Type 2 diabetes mellitus Managed with Lantus , Jardiance  and Ozempic. A1c at 6.8. Blood sugars controlled with monitoring. - Continue current diabetes medications.      Non-STEMI (non-ST elevated myocardial infarction) (HCC) (Chronic)   Non-STEMI back in 2023.  Stable EF on echo with no significant wall motion maladies.  No heart failure symptoms             Follow-Up: Return in about 5 months (around 02/02/2024) for Routine follow up with me, [employer]. - Schedule follow-up in October or November.     Signed, Arleen Lacer, MD, MS John Dodson, M.D., M.S. Interventional Chartered certified accountant  Pager # [phone]

## 2023-09-10 ENCOUNTER — Encounter: Payer: Self-pay | Admitting: Cardiology

## 2023-09-10 NOTE — Assessment & Plan Note (Signed)
 Managed with Repatha  140 mg every 2 weeks and Lipitor  40 mg.  LDL at 24, total cholesterol and HDL low. Effective management. - Continue current lipid-lowering therapy. - Reassess lipid levels at next follow-up for potential Lipitor  adjustment.  Type 2 diabetes mellitus Managed with Lantus , Jardiance  and Ozempic. A1c at 6.8. Blood sugars controlled with monitoring. - Continue current diabetes medications.

## 2023-09-10 NOTE — Assessment & Plan Note (Addendum)
 No further angina following PCI to the LM-LCx. GDMT: -Continue combination of carvedilol  12.5 mg twice daily and amlodipine  2.5 mg daily along with Ranexa  for BP and antianginal benefit. - Continue losartan  25 mg for additional afterload reduction. - On combination of 40 mg Lipitor  plus Repatha  for lipids along with Ozempic and Jardiance . - Currently on DAPT (ASA/Plavix ) uninterrupted for 6 months-until August 2025

## 2023-09-10 NOTE — Assessment & Plan Note (Addendum)
 Angina essentially resolved following left main to LCx stent placement after documented occlusion of the sequential graft to OM and PDA. Follow-up echo showed normal function. Small but nondominant RCA occluded but not significant. - Continue aspirin  and Plavix  for six months post-procedure (would be August 2025.) Then convert to Plavix  monotherapy for long-term management. For urgent procedures, after 3 months acceptable to consider holding aspirin  and Plavix  for urgent procedures, ideally avoid interruption x 6 months. As of August 2025, okay to hold Plavix  5 to 7 days preop for surgeries or procedures.

## 2023-09-10 NOTE — Assessment & Plan Note (Signed)
 Non-STEMI back in 2023.  Stable EF on echo with no significant wall motion maladies.  No heart failure symptoms

## 2023-09-10 NOTE — Assessment & Plan Note (Signed)
 Blood pressure well-controlled on combination of Norvasc  2.5 mg daily, carvedilol  12.5 mg twice daily and losartan  25 mg daily. No complications. - Continue current antihypertensive medications.

## 2023-09-12 ENCOUNTER — Ambulatory Visit (INDEPENDENT_AMBULATORY_CARE_PROVIDER_SITE_OTHER): Payer: Medicare Other

## 2023-09-12 DIAGNOSIS — I442 Atrioventricular block, complete: Secondary | ICD-10-CM | POA: Diagnosis not present

## 2023-09-12 LAB — CUP PACEART REMOTE DEVICE CHECK
Battery Remaining Longevity: 12 mo
Battery Voltage: 2.75 V
Brady Statistic AP VP Percent: 1.56 %
Brady Statistic AP VS Percent: 0 %
Brady Statistic AS VP Percent: 98.44 %
Brady Statistic AS VS Percent: 0 %
Brady Statistic RA Percent Paced: 1.54 %
Brady Statistic RV Percent Paced: 100 %
Date Time Interrogation Session: 20250516022515
Implantable Lead Connection Status: 753985
Implantable Lead Connection Status: 753985
Implantable Lead Connection Status: 753985
Implantable Lead Implant Date: 20180315
Implantable Lead Implant Date: 20180315
Implantable Lead Implant Date: 20180315
Implantable Lead Location: 753858
Implantable Lead Location: 753859
Implantable Lead Location: 753860
Implantable Lead Model: 4076
Implantable Lead Model: 4298
Implantable Lead Model: 5076
Implantable Pulse Generator Implant Date: 20180315
Lead Channel Impedance Value: 266 Ohm
Lead Channel Impedance Value: 304 Ohm
Lead Channel Impedance Value: 380 Ohm
Lead Channel Impedance Value: 380 Ohm
Lead Channel Impedance Value: 418 Ohm
Lead Channel Impedance Value: 418 Ohm
Lead Channel Impedance Value: 513 Ohm
Lead Channel Impedance Value: 513 Ohm
Lead Channel Impedance Value: 513 Ohm
Lead Channel Impedance Value: 684 Ohm
Lead Channel Impedance Value: 684 Ohm
Lead Channel Impedance Value: 779 Ohm
Lead Channel Impedance Value: 798 Ohm
Lead Channel Impedance Value: 817 Ohm
Lead Channel Pacing Threshold Amplitude: 1.25 V
Lead Channel Pacing Threshold Amplitude: 1.375 V
Lead Channel Pacing Threshold Amplitude: 2.75 V
Lead Channel Pacing Threshold Pulse Width: 0.4 ms
Lead Channel Pacing Threshold Pulse Width: 0.4 ms
Lead Channel Pacing Threshold Pulse Width: 0.9 ms
Lead Channel Sensing Intrinsic Amplitude: 1 mV
Lead Channel Sensing Intrinsic Amplitude: 1 mV
Lead Channel Sensing Intrinsic Amplitude: 19.75 mV
Lead Channel Sensing Intrinsic Amplitude: 19.75 mV
Lead Channel Setting Pacing Amplitude: 2 V
Lead Channel Setting Pacing Amplitude: 2.5 V
Lead Channel Setting Pacing Amplitude: 2.5 V
Lead Channel Setting Pacing Pulse Width: 0.4 ms
Lead Channel Setting Pacing Pulse Width: 0.9 ms
Lead Channel Setting Sensing Sensitivity: 4 mV
Zone Setting Status: 755011
Zone Setting Status: 755011

## 2023-09-15 ENCOUNTER — Ambulatory Visit: Payer: Self-pay | Admitting: Internal Medicine

## 2023-09-22 ENCOUNTER — Other Ambulatory Visit: Payer: Self-pay | Admitting: Internal Medicine

## 2023-10-05 NOTE — Progress Notes (Deleted)
 Cardiology Office Note Date:  10/05/2023  Patient ID:  Jovahn, Breit 1960/11/08, MRN [id number] PCP:  Lavenia Post., MD  Cardiologist:  Dr. Addie Holstein Electrophysiologist: Dr. Carolynne Citron    Chief Complaint:  *** annual visit  History of Present Illness: BASIM BARTNIK is a 63 y.o. male with history of  CAD (CABG 2021, 04/28/23: DES to Left Main-Ostial to Proximal LCx DES PCI with score flex PTCA pretreatment),  ICM CHB w/CRT-PPM,  HTN, HLD, PE (associated/post COVID infection 2021), DM, CKD (III)  He saw Dr. Carolynne Citron 07/30/22, device functioning normally.  No changes were made.  Seeing cardiology team regularly  He saw Dr. Addie Holstein 09/02/23, doing well, no anginal symptoms, to continue DAPT until AUg 2025   TODAY  *** device only He has labs regularly via his PMD and nephrologist (Dr. Nance Aw)   Device information MDT CRT-P implanted 07/11/2016   Past Medical History:  Diagnosis Date   Blepharitis of both eyes    Chronic   Cataracts, both eyes    CKD (chronic kidney disease) stage 3, GFR 30-59 ml/min (HCC)    s/p R Nephrectomy/adrenalectomy & DM-Nephropathy (baseline creatinine 1.4-1.5)   COVID-19 virus infection 04/25/2019   Diabetic peripheral neuropathy associated with type 2 diabetes mellitus (HCC)    Essential hypertension    GERD without esophagitis    History of basal cell carcinoma (BCC) excision    History of complete heart block 06/2016   s/p BiV PPM (now followed by Dr. Tayolor - EP)   History of non-ST elevation myocardial infarction (NSTEMI)    And several occasions of unstable angina   Hx of CABGx4 08/25/2019   (Dr. Bonnie Butters, Arlin Benes): LIMA-LAD, Seq Rad- OM2-LPDA, RIMA-? OM1 (called Ramus on report, but no ramus seen on cath).   Hyperlipidemia associated with type 2 diabetes mellitus (HCC)    on atorvastatin  40 mg.   Multiple Vessel CAD -- s/p PCI of LAD, OM1, LPL1, LPDA & non-dominant RCA --> now S/P CABG x 4 60%   (No cath report prior  to 2016) (stents in proxLAD, prox OM1 x 2, prox LPL1 & 3-4 in L PDA & ~2 in  Known non-dominant RCA;  07/2019: MV CAD - patent stents in p-m LAD, p OM1, p LPL & LPDA as well as RCA -> Ost LCx ~60-70% (DFR ++), long mLAD 60% post-stent (DFR ++) --> referred for CABG   Obesity (BMI 30.0-34.9)    Occlusion of left vertebral artery    Consider repossible acute lesion in August 2020 with presentation of headache.  CTA suggested occluded left vertebral artery throughout the neck.  Faint string-like enhancement intermittently visible suggesting recent occlusion.  Partial reconstruction and posterior fossa.  Left PICA patent.  (Consider possible left vertebral artery dissection)   OSA treated with BiPAP 02/2019   Diagnosis in late 2020 ([facility])--> delayed onset of treatment with BiPAP due to Covid hospitalization followed by PE. ->  BiPAP setting at 21/17 cm   Pulmonary embolism (HCC) 05/18/2019   (1 month following COVID-19 infection): DVT-PE (bilateral PEs noted on VQ scan)-> started on warfarin.  (On warfarin plus Plavix  now with aspirin  stopped.)   Type 2 diabetes mellitus with complication, with long-term current use of insulin  (HCC)    On Lantus , Jardiance , Metformin & Onglyza    Past Surgical History:  Procedure Laterality Date   BASAL CELL CARCINOMA EXCISION  01/2015   CAROTID DUPLEX SCAN  12/25/2018   [facility]: Mild plaque  in both carotids.  139% bilateral.  Right vertebral flow normal antegrade, Left not seen; normal bilateral subclavian flow..   CHOLECYSTECTOMY     CORONARY ARTERY BYPASS GRAFT N/A 08/25/2019   Procedure: CORONARY ARTERY BYPASS GRAFTING (CABG) TIMES FOUR, ON PUMP, USING LEFT AND RIGHT INTERNAL MAMMARY ARTERY AND LEFT RADIAL ARTERY;  Surgeon: Rudine Cos, MD;  Location: [facility] OR;  Service: Open Heart Surgery;; LIMA-LAD, Seq Rad OM2-LPDA, RIMA-OM1   CORONARY STENT INTERVENTION  04/10/2015   [facility] [place]; Ryan Coyer, DO) -> DES PCI mLPDA: Xience  Alpine DES 2.5 mm x 18 mm, Xience Alpine DES 2.25 mm x 12 mm overlapping.   CORONARY STENT INTERVENTION  04/04/2016   [facility] [place]; [place], MD)--> (urgent) 100% mLPDA PTCA with 2.25 mm balloon - reduced to 50%; mid OM2 90% - DES PCI (Xience DES  2.5 x 18).    CORONARY STENT INTERVENTION  2015   Prior to December 2016, STENTS noted in prox LAD, proximal OM1, and at least 2 stents in proximal L PDA   CORONARY STENT INTERVENTION  11/17/2018   [facility] [place]; Ryan Coyer, DO): CULPRIT: mid RCA 90% (DES PCI) -Resolute Onyx DES 2.5 mm x 30 mm --> COMPLICATION: Large left arm hematoma-evaluated by vascular and orthopedic surgery, managed with splint and arm elevation.   CORONARY STENT INTERVENTION  12/28/2018   [facility] [place]; Edmond Gosselin, MD, referred by Dr. Posey Broach) --> INDICATION (urgent, Unstable Angina): Moderate-severe (70%) stenosis of ostial RCA  -> DES PCI Resolute Onyx DES 2.5 mm x 12 mm.    CORONARY STENT INTERVENTION N/A 05/21/2023   Procedure: CORONARY STENT INTERVENTION;  Surgeon: Arleen Lacer, MD;  Location: [facility] INVASIVE CV LAB;  Service: Cardiovascular;  Laterality: N/A;   LEFT HEART CATH N/A 05/21/2023   Procedure: Left Heart Cath;  Surgeon: Arleen Lacer, MD;  Location: [facility] INVASIVE CV LAB;  Service: Cardiovascular;  Laterality: N/A;   LEFT HEART CATH AND CORONARY ANGIOGRAPHY  04/10/2015   [facility] [place]; Ryan Coyer, DO)--> EF 55%.  Mild inferior HK.  Coronaries-LM: Normal, p LAD STENT 30% ISR, mLAD 20% & diffuse dLAD ~20%; Dom LCx: mCx 20%, ~pOM1 STENT (noted as OM2 in other reports) patent with mid 30%, OM2 normal, prox LPDA "STENTS" patent with SEVERE mid L PDA 90-95% (DES PCI x 2 overlapping); Small-non-dominant RCA  patent    LEFT HEART CATH AND CORONARY ANGIOGRAPHY  04/04/2016   [facility] [place]; [place], MD)--> (urgent): EF 70%.  Previous LAD,~OM2 and proximal PDA stents patent; -> mLAD 35%, dLAD 40%; LCx-OM1  75% (short lesion, med Rx), mid OM2 90% (DES PCI), pLPDA stents patent w/ mPDA 100% (PTCA only - reduced to 50%); non-dom RCA - ost RCA 60% & mRCA 75%   LEFT HEART CATH AND CORONARY ANGIOGRAPHY  11/17/2018   [facility]; Ryan Coyer, DO) indication: Angina.  LM normal; LAD - ost LAD ~50%, pLAD STENT ~30% ISR with dLAD ~40%; Dom LCx - ost & prox Cx 30%, OM1 STENT 20% ISR, OM2 STENT 50% distal edge, pLPDA overlapping STENTS ~20%; nonDom RCA - proxRCA 40%, mid 90% (DES PCI)   LEFT HEART CATH AND CORONARY ANGIOGRAPHY  12/28/2018   [facility] [place]; Edmond Gosselin, MD, referred by Dr. Royston Cornea McGukin) --> INDICATION (urgent, Unstable Angina): Moderate-severe (70%) stenosis of ostial RCA (DES PCI), mRCA 30%.  Otherwise no significant change from July 2020: dLM 20%, pLAD ~40% ISR , dLAD long/diffuse ~50%; OstLCx 30%, OM1 ~15%  OM2 ~30% with patent  LPDA stents/PTCA site.    LEFT HEART CATH AND CORONARY ANGIOGRAPHY N/A 08/20/2019   Procedure: LEFT HEART CATH AND CORONARY ANGIOGRAPHY;  Surgeon: Arleen Lacer, MD;  Location: [facility] INVASIVE CV LAB;  Service: Cardiovascular;; Multivessel CAD: Patent stents in proximal to mid LAD, proximal OM1, proximal LPL 1 and several stents in L PDA as well as RCA (nondominant).  Ostial LCx 60%-highly DFR +0.84.  There is a long mid LAD 60% stenosis after the stent--highly DFR +0.67.--> CABG   LEFT HEART CATH AND CORS/GRAFTS ANGIOGRAPHY N/A 04/28/2023   Procedure: LEFT HEART CATH AND CORS/GRAFTS ANGIOGRAPHY;  Surgeon: Arleen Lacer, MD;  Location: [facility] INVASIVE CV LAB;  Service: Cardiovascular;  Laterality: N/A;   NEPHRECTOMY Right 2012   With adrenalectomy   NM MYOVIEW  LTD  08/20/2018   [facility] -> Lexiscan  Myoview : EF 81%.  No reversible ischemia or infarction.  Normal wall motion.   PACEMAKER IMPLANT  06/2016   [facility] [place] ([employer]) -> according to CT of chest, leads positioned in right atrium, cardiac apex and coronary  sinus (suggesting CRT-P - BiV Pacing))   RADIAL ARTERY HARVEST Left 08/25/2019   Procedure: RADIAL ARTERY HARVEST;  Surgeon: Rudine Cos, MD;  Location: [facility] OR;  Service: Open Heart Surgery;  Laterality: Left;   TEE WITHOUT CARDIOVERSION N/A 08/25/2019   Procedure: TRANSESOPHAGEAL ECHOCARDIOGRAM (TEE);  Surgeon: Rudine Cos, MD;  [facility] OR;  Service: Open Heart Surgery;  Laterality: N/A;   TRANSTHORACIC ECHOCARDIOGRAM  11/2018; 05/01/2019   [facility]) a) EF 60-65%.  Mild TR.;; b)moderate concentric LVH.  EF 55 to 60%.  No R WMA.  Normal RV size and function.  Normal atrial sizes.  Normal valves.    Current Outpatient Medications  Medication Sig Dispense Refill   albuterol  (VENTOLIN  HFA) 108 (90 Base) MCG/ACT inhaler Inhale 2 puffs into the lungs every 4 (four) hours as needed for wheezing or shortness of breath.     amLODipine  (NORVASC ) 2.5 MG tablet Take 1 tablet (2.5 mg total) by mouth daily. 90 tablet 3   ASHWAGANDHA PO Take 2,100 mg by mouth daily.     aspirin  EC 81 MG tablet Take 1 tablet (81 mg total) by mouth daily. Swallow whole. 30 tablet 3   atorvastatin  (LIPITOR ) 80 MG tablet TAKE 1 TABLET BY MOUTH DAILY (Patient taking differently: Take 80 mg by mouth every evening.) 90 tablet 2   Capsicum, Cayenne, (CAYENNE PEPPER PO) Take 1 capsule by mouth in the morning and at bedtime.     carvedilol  (COREG ) 12.5 MG tablet Take 1 tablet (12.5 mg total) by mouth 2 (two) times daily with a meal. 180 tablet 3   clopidogrel  (PLAVIX ) 75 MG tablet TAKE 1 TABLET BY MOUTH DAILY 90 tablet 3   COLLAGEN PO Take 2 tablets by mouth in the morning and at bedtime.     Continuous Glucose Receiver (FREESTYLE LIBRE 2 READER) DEVI 1 each by miscellaneous route continuous.     Evolocumab  (REPATHA  SURECLICK) 140 MG/ML SOAJ Inject 140 mg into the skin every 14 (fourteen) days. 2 mL 12   furosemide  (LASIX ) 40 MG tablet Take 1 tablet (40 mg total) by mouth daily as needed for fluid or  edema (Poor more than 3 pound weight gain overnight or more than 5 pound weight gain over baseline). 90 tablet 2   gabapentin (NEURONTIN) 100 MG capsule Take 200 mg by mouth at bedtime.  insulin  glargine (LANTUS  SOLOSTAR) 100 UNIT/ML Solostar Pen Inject 50 Units into the skin every evening. 100UNITS/3 ML     JARDIANCE  25 MG TABS tablet Take 25 mg by mouth daily.     losartan  (COZAAR ) 25 MG tablet Take 12.5 mg by mouth daily.     Magnesium  Oxide -Mg Supplement ([employer] MAGNESIUM ) 500 MG TABS Take 500 mg by mouth daily.     nitroGLYCERIN  (NITROSTAT ) 0.4 MG SL tablet DISSOLVE 1 TABLET UNDER THE  TONGUE EVERY 5 MINUTES AS NEEDED FOR CHEST PAIN. MAX OF 3 TABLETS IN 15 MINUTES. CALL 911 IF PAIN  PERSISTS. 100 tablet 2   Omega-3 Fatty Acids (FISH OIL BURP-LESS) 1000 MG CAPS Take 2,000 mg by mouth daily.     ondansetron  (ZOFRAN ) 4 MG tablet Take 4 mg by mouth every 8 (eight) hours as needed for nausea or vomiting.     pantoprazole  (PROTONIX ) 40 MG tablet Take 40 mg by mouth 2 (two) times daily.     ranolazine  (RANEXA ) 500 MG 12 hr tablet Take 1 tablet (500 mg total) by mouth 2 (two) times daily. 180 tablet 3   Semaglutide, 1 MG/DOSE, (OZEMPIC, 1 MG/DOSE,) 2 MG/1.5ML SOPN Inject 1 mg into the skin every Tuesday.     Vitamin D, Cholecalciferol, 25 MCG (1000 UT) CAPS Take 1,000 Units by mouth at bedtime.     No current facility-administered medications for this visit.    Allergies:   Contrast media [iodinated contrast media], Integrilin [eptifibatide], Codeine, Wound dressing adhesive, Zithromax [azithromycin], and Glipizide   Social History:  The patient  reports that he has never smoked. He has never used smokeless tobacco. He reports that he does not drink alcohol and does not use drugs.   Family History:  The patient's family history includes Coronary artery disease in his father; Heart disease in his brother and sister; Hyperlipidemia in his father and mother; Hypertension in his father and mother;  Stroke in his mother.  ROS:  Please see the history of present illness.    All other systems are reviewed and otherwise negative.   PHYSICAL EXAM:  VS:  There were no vitals taken for this visit. BMI: There is no height or weight on file to calculate BMI. Well nourished, well developed, in no acute distress HEENT: normocephalic, atraumatic Neck: no JVD, carotid bruits or masses Cardiac: *** RRR; no significant murmurs, no rubs, or gallops Lungs: *** CTA b/l, no wheezing, rhonchi or rales Abd: soft, nontender MS: no deformity or atrophy Ext: *** no edema Skin: warm and dry, no rash Neuro:  No gross deficits appreciated Psych: euthymic mood, full affect  *** PPM site is stable, no tethering or discomfort   EKG:  not done today 09/02/23: SR/V paced (RBBB morphology)  Device interrogation done today and reviewed by myself:  *** Battery and lead measurements are good ***  ECHO 07/01/2023:: Normal LVEF of 60 to 65%.  No RWMA.  Mild septal hypertrophy.  GR 1 DD.  Normal PAP and RAP.  Normal valves.  EKG shows paced rhythm versus bundle branch block CATH 04/28/2023: Progression of native disease-occluded RCA stent as well as mid LAD at LIMA insertion.  Widely patent pedicled LIMA to LAD and pedicled RIMA to LPDA with flush occlusion of sequential left radial. Staged PCI 05/21/2023: Successful Left Main-Ostial to Proximal LCx DES PCI with score flex PTCA pretreatment using a Onyx Frontier DES 4.0 mm x 30 mm postdilated to 4.6 mm. 80% lesion reduced to 0% with TIMI-3 flow.  Sidebranch flow in the LAD maintained.    10/03/21: stress myoview   The study is normal. The study is low risk.   No ST deviation was noted.   LV perfusion is normal.   Left ventricular function is normal. Nuclear stress EF: 62 %. The left ventricular ejection fraction is normal (55-65%). End diastolic cavity size is normal.   Prior study available for comparison from 08/20/2018.   Low risk stress nuclear study with normal  perfusion and normal left ventricular regional and global systolic function.     08/27/19: limited echo IMPRESSIONS  1. No significant pericardial effusion noted. Likely prominent prominent  epicardial fat pad. Given limited views - EF appears normal RV is normal  size.    08/21/19: TTE  1. Left ventricular ejection fraction, by estimation, is 55 to 60%. The  left ventricle has normal function. Left ventricular endocardial border  not optimally defined to evaluate regional wall motion. There is moderate  concentric left ventricular  hypertrophy. Left ventricular diastolic parameters are consistent with  Grade I diastolic dysfunction (impaired relaxation).   2. Right ventricular systolic function is normal. The right ventricular  size is normal. There is borderline elevated pulmonary artery systolic  pressure. The estimated right ventricular systolic pressure is 34.8 mmHg.   3. The mitral valve is normal in structure. Trivial mitral valve  regurgitation. No evidence of mitral stenosis.   4. Tricuspid valve regurgitation is mild to moderate.   5. The aortic valve is normal in structure. Aortic valve regurgitation is  not visualized. No aortic stenosis is present.   6. The inferior vena cava is normal in size with greater than 50%  respiratory variability, suggesting right atrial pressure of 3 mmHg.   Comparison(s): No prior Echocardiogram.    08/20/2019: LHC The left ventricular systolic function is normal. The left ventricular ejection fraction is 55-65% by visual estimate. LV end diastolic pressure is normal.LV end diastolic pressure is normal. ------------------- There is stent from ostial to almost distal small nondominant RCA: Ost RCA to Mid RCA stent is diffusely 35% stenosed. Ost Cx to Prox Cx lesion is 60% stenosed. Ost LAD to Mid LAD stent is 10% stenosed. Mid LAD to Dist LAD lesion is 60% stenosed. Previously placed 2nd Mrg stent (DES) is widely patent. Lat 2nd Mrg lesion  is 80% stenosed. Previously described. Previously placed 1st LPL stent (DES) is widely patent. LPAV lesion is 50% stenosed. LPDA stent is 5% stenosed.   Multivessel CAD with essentially patent stents in proximal to mid LAD, proximal OM1, proximal LPL1, several stents in the L PDA as well as RCA that is a small nondominant vessel.  No obvious lesions within stents noted. Ostial LCx roughly 60% --> highly DFR positive with 0.84 Long mid LAD 60% after stent--highly DFR +0.67-0.73 Normal LVEDP   With significant LAD as well as ostial LCx disease--best course of action is CVTS consultation for CABG.   Recent Labs: 04/25/2023: ALT 20 06/10/2023: BUN 29; Creatinine, Ser 1.82; Hemoglobin 15.6; Platelets 130; Potassium 4.3; Sodium 140  04/26/2023: Cholesterol 77; HDL 39; LDL Cholesterol NEG 4; Total CHOL/HDL Ratio 2.0; Triglycerides 208; VLDL 42   CrCl cannot be calculated (Patient's most recent lab result is older than the maximum 21 days allowed.).   Wt Readings from Last 3 Encounters:  09/02/23 198 lb 9.6 oz (90.1 kg)  06/10/23 202 lb 6.4 oz (91.8 kg)  05/21/23 194 lb (88 kg)     Other studies reviewed: Additional studies/records reviewed today include:  summarized above  ASSESSMENT AND PLAN:  CRT-P *** Intact function  *** no programming changes made  CAD *** No new or anginal sounding symptoms *** On Plavix , statin, BB C/w Dr. Elwood Hammonds  HTN *** Looks good  4.  ICM Recovered LVEF    Disposition: F/u with remotes as usual an in clinic in ***, sooner if needed  Current medicines are reviewed at length with the patient today.  The patient did not have any concerns regarding medicines.  [place], PA-C 10/05/2023 4:42 PM     [facility] [address] [place] [address] [phone] (office)  [phone] (fax)

## 2023-10-08 ENCOUNTER — Ambulatory Visit: Admitting: Physician Assistant

## 2023-10-20 NOTE — Progress Notes (Signed)
 Remote pacemaker transmission.

## 2023-10-27 ENCOUNTER — Other Ambulatory Visit: Payer: Self-pay | Admitting: Nurse Practitioner

## 2023-11-04 NOTE — Progress Notes (Signed)
 Cardiology Office Note Date:  11/04/2023  Patient ID:  Nichlos, Kunzler [birth year], MRN [id number] PCP:  Delilah Murray HERO., MD  Cardiologist:  Dr. Anner Electrophysiologist: Dr. Waddell    Chief Complaint:   annual visit  History of Present Illness: CORNELIUS MARULLO is a 63 y.o. male with history of  CAD (CABG 2021, 04/28/23: DES to Left Main-Ostial to Proximal LCx DES PCI with score flex PTCA pretreatment),  ICM CHB w/CRT-PPM,  HTN, HLD, PE (associated/post COVID infection 2021), DM, CKD (III)  He saw Dr. Waddell 07/30/22, device functioning normally.  No changes were made.  Seeing cardiology team regularly  He saw Dr. Anner 09/02/23, doing well, no anginal symptoms, to continue DAPT until AUg 2025   TODAY  He is doing well No CP, palpitations, cardiac awareness No DOE, SOB No near syncope or syncope He has labs regularly via his PMD and nephrologist (Dr. Howell)   Device information MDT CRT-P implanted 07/11/2016  He is dependent and very symptomatic with hx of syncope w/LOC Prefers to lay down for devic checks   Past Medical History:  Diagnosis Date   Blepharitis of both eyes    Chronic   Cataracts, both eyes    CKD (chronic kidney disease) stage 3, GFR 30-59 ml/min (HCC)    s/p R Nephrectomy/adrenalectomy & DM-Nephropathy (baseline creatinine 1.4-1.5)   COVID-19 virus infection 04/25/2019   Diabetic peripheral neuropathy associated with type 2 diabetes mellitus (HCC)    Essential hypertension    GERD without esophagitis    History of basal cell carcinoma (BCC) excision    History of complete heart block 06/2016   s/p BiV PPM (now followed by Dr. Tayolor - EP)   History of non-ST elevation myocardial infarction (NSTEMI)    And several occasions of unstable angina   Hx of CABGx4 08/25/2019   (Dr. Diamantina, [facility]): LIMA-LAD, Seq Rad- OM2-LPDA, RIMA-? OM1 (called Ramus on report, but no ramus seen on cath).   Hyperlipidemia associated with type  2 diabetes mellitus (HCC)    on atorvastatin  40 mg.   Multiple Vessel CAD -- s/p PCI of LAD, OM1, LPL1, LPDA & non-dominant RCA --> now S/P CABG x 4 60%   (No cath report prior to 2016) (stents in proxLAD, prox OM1 x 2, prox LPL1 & 3-4 in L PDA & ~2 in  Known non-dominant RCA;  07/2019: MV CAD - patent stents in p-m LAD, p OM1, p LPL & LPDA as well as RCA -> Ost LCx ~60-70% (DFR ++), long mLAD 60% post-stent (DFR ++) --> referred for CABG   Obesity (BMI 30.0-34.9)    Occlusion of left vertebral artery    Consider repossible acute lesion in August 2020 with presentation of headache.  CTA suggested occluded left vertebral artery throughout the neck.  Faint string-like enhancement intermittently visible suggesting recent occlusion.  Partial reconstruction and posterior fossa.  Left PICA patent.  (Consider possible left vertebral artery dissection)   OSA treated with BiPAP 02/2019   Diagnosis in late 2020 ([facility])--> delayed onset of treatment with BiPAP due to Covid hospitalization followed by PE. ->  BiPAP setting at 21/17 cm   Pulmonary embolism (HCC) 05/18/2019   (1 month following COVID-19 infection): DVT-PE (bilateral PEs noted on VQ scan)-> started on warfarin.  (On warfarin plus Plavix  now with aspirin  stopped.)   Type 2 diabetes mellitus with complication, with long-term current use of insulin  (HCC)    On Lantus , Jardiance , Metformin &  Onglyza    Past Surgical History:  Procedure Laterality Date   BASAL CELL CARCINOMA EXCISION  01/2015   CAROTID DUPLEX SCAN  12/25/2018   [facility]: Mild plaque in both carotids.  139% bilateral.  Right vertebral flow normal antegrade, Left not seen; normal bilateral subclavian flow..   CHOLECYSTECTOMY     CORONARY ARTERY BYPASS GRAFT N/A 08/25/2019   Procedure: CORONARY ARTERY BYPASS GRAFTING (CABG) TIMES FOUR, ON PUMP, USING LEFT AND RIGHT INTERNAL MAMMARY ARTERY AND LEFT RADIAL ARTERY;  Surgeon: German Bartlett PEDLAR, MD;  Location: [facility] OR;   Service: Open Heart Surgery;; LIMA-LAD, Seq Rad OM2-LPDA, RIMA-OM1   CORONARY STENT INTERVENTION  04/10/2015   [facility] [place]; Alverna Sieving, DO) -> DES PCI mLPDA: Xience Alpine DES 2.5 mm x 18 mm, Xience Alpine DES 2.25 mm x 12 mm overlapping.   CORONARY STENT INTERVENTION  04/04/2016   [facility] [place]; [place], [place])--> (urgent) 100% mLPDA PTCA with 2.25 mm balloon - reduced to 50%; mid OM2 90% - DES PCI (Xience DES  2.5 x 18).    CORONARY STENT INTERVENTION  2015   Prior to December 2016, STENTS noted in prox LAD, proximal OM1, and at least 2 stents in proximal L PDA   CORONARY STENT INTERVENTION  11/17/2018   [facility] [place]; Alverna Sieving, DO): CULPRIT: mid RCA 90% (DES PCI) -Resolute Onyx DES 2.5 mm x 30 mm --> COMPLICATION: Large left arm hematoma-evaluated by vascular and orthopedic surgery, managed with splint and arm elevation.   CORONARY STENT INTERVENTION  12/28/2018   [facility] [place]; Elspeth Kitten, MD, referred by Dr. Lynwood Landing) --> INDICATION (urgent, Unstable Angina): Moderate-severe (70%) stenosis of ostial RCA  -> DES PCI Resolute Onyx DES 2.5 mm x 12 mm.    CORONARY STENT INTERVENTION N/A 05/21/2023   Procedure: CORONARY STENT INTERVENTION;  Surgeon: Anner Alm ORN, MD;  Location: [facility] INVASIVE CV LAB;  Service: Cardiovascular;  Laterality: N/A;   LEFT HEART CATH N/A 05/21/2023   Procedure: Left Heart Cath;  Surgeon: Anner Alm ORN, MD;  Location: [facility] INVASIVE CV LAB;  Service: Cardiovascular;  Laterality: N/A;   LEFT HEART CATH AND CORONARY ANGIOGRAPHY  04/10/2015   [facility] [place]; Alverna Sieving, DO)--> EF 55%.  Mild inferior HK.  Coronaries-LM: Normal, p LAD STENT 30% ISR, mLAD 20% & diffuse dLAD ~20%; Dom LCx: mCx 20%, ~pOM1 STENT (noted as OM2 in other reports) patent with mid 30%, OM2 normal, prox LPDA STENTS patent with SEVERE mid L PDA 90-95% (DES PCI x 2 overlapping); Small-non-dominant RCA  patent    LEFT HEART CATH AND  CORONARY ANGIOGRAPHY  04/04/2016   [facility] [place]; [place], MD)--> (urgent): EF 70%.  Previous LAD,~OM2 and proximal PDA stents patent; -> mLAD 35%, dLAD 40%; LCx-OM1 75% (short lesion, med Rx), mid OM2 90% (DES PCI), pLPDA stents patent w/ mPDA 100% (PTCA only - reduced to 50%); non-dom RCA - ost RCA 60% & mRCA 75%   LEFT HEART CATH AND CORONARY ANGIOGRAPHY  11/17/2018   [facility]; Alverna Sieving, DO) indication: Angina.  LM normal; LAD - ost LAD ~50%, pLAD STENT ~30% ISR with dLAD ~40%; Dom LCx - ost & prox Cx 30%, OM1 STENT 20% ISR, OM2 STENT 50% distal edge, pLPDA overlapping STENTS ~20%; nonDom RCA - proxRCA 40%, mid 90% (DES PCI)   LEFT HEART CATH AND CORONARY ANGIOGRAPHY  12/28/2018   [facility] [place]; Elspeth Kitten, MD, referred by Dr. Lynwood McGukin) --> INDICATION (urgent, Unstable  Angina): Moderate-severe (70%) stenosis of ostial RCA (DES PCI), mRCA 30%.  Otherwise no significant change from July 2020: dLM 20%, pLAD ~40% ISR , dLAD long/diffuse ~50%; OstLCx 30%, OM1 ~15% OM2 ~30% with patent  LPDA stents/PTCA site.    LEFT HEART CATH AND CORONARY ANGIOGRAPHY N/A 08/20/2019   Procedure: LEFT HEART CATH AND CORONARY ANGIOGRAPHY;  Surgeon: Anner Alm ORN, MD;  Location: [facility] INVASIVE CV LAB;  Service: Cardiovascular;; Multivessel CAD: Patent stents in proximal to mid LAD, proximal OM1, proximal LPL 1 and several stents in L PDA as well as RCA (nondominant).  Ostial LCx 60%-highly DFR +0.84.  There is a long mid LAD 60% stenosis after the stent--highly DFR +0.67.--> CABG   LEFT HEART CATH AND CORS/GRAFTS ANGIOGRAPHY N/A 04/28/2023   Procedure: LEFT HEART CATH AND CORS/GRAFTS ANGIOGRAPHY;  Surgeon: Anner Alm ORN, MD;  Location: [facility] INVASIVE CV LAB;  Service: Cardiovascular;  Laterality: N/A;   NEPHRECTOMY Right 2012   With adrenalectomy   NM MYOVIEW  LTD  08/20/2018   [facility] -> Lexiscan  Myoview : EF 81%.  No reversible ischemia or infarction.  Normal  wall motion.   PACEMAKER IMPLANT  06/2016   [facility] [place] ([employer]) -> according to CT of chest, leads positioned in right atrium, cardiac apex and coronary sinus (suggesting CRT-P - BiV Pacing))   RADIAL ARTERY HARVEST Left 08/25/2019   Procedure: RADIAL ARTERY HARVEST;  Surgeon: German Bartlett PEDLAR, MD;  Location: [facility] OR;  Service: Open Heart Surgery;  Laterality: Left;   TEE WITHOUT CARDIOVERSION N/A 08/25/2019   Procedure: TRANSESOPHAGEAL ECHOCARDIOGRAM (TEE);  Surgeon: German Bartlett PEDLAR, MD;  Location: [facility] OR;  Service: Open Heart Surgery;  Laterality: N/A;   TRANSTHORACIC ECHOCARDIOGRAM  11/2018; 05/01/2019   [facility]) a) EF 60-65%.  Mild TR.;; b)moderate concentric LVH.  EF 55 to 60%.  No R WMA.  Normal RV size and function.  Normal atrial sizes.  Normal valves.    Current Outpatient Medications  Medication Sig Dispense Refill   albuterol  (VENTOLIN  HFA) 108 (90 Base) MCG/ACT inhaler Inhale 2 puffs into the lungs every 4 (four) hours as needed for wheezing or shortness of breath.     amLODipine  (NORVASC ) 2.5 MG tablet Take 1 tablet (2.5 mg total) by mouth daily. 90 tablet 3   ASHWAGANDHA PO Take 2,100 mg by mouth daily.     aspirin  EC 81 MG tablet Take 1 tablet (81 mg total) by mouth daily. Swallow whole. 30 tablet 3   atorvastatin  (LIPITOR ) 80 MG tablet TAKE 1 TABLET BY MOUTH DAILY 100 tablet 2   Capsicum, Cayenne, (CAYENNE PEPPER PO) Take 1 capsule by mouth in the morning and at bedtime.     carvedilol  (COREG ) 12.5 MG tablet Take 1 tablet (12.5 mg total) by mouth 2 (two) times daily with a meal. 180 tablet 3   clopidogrel  (PLAVIX ) 75 MG tablet TAKE 1 TABLET BY MOUTH DAILY 90 tablet 3   COLLAGEN PO Take 2 tablets by mouth in the morning and at bedtime.     Continuous Glucose Receiver (FREESTYLE LIBRE 2 READER) DEVI 1 each by miscellaneous route continuous.     Evolocumab  (REPATHA  SURECLICK) 140 MG/ML SOAJ Inject 140 mg into the skin every 14 (fourteen)  days. 2 mL 12   furosemide  (LASIX ) 40 MG tablet Take 1 tablet (40 mg total) by mouth daily as needed for fluid or edema (Poor more than 3 pound weight gain overnight or more than 5 pound weight gain over  baseline). 90 tablet 2   gabapentin (NEURONTIN) 100 MG capsule Take 200 mg by mouth at bedtime.     insulin  glargine (LANTUS  SOLOSTAR) 100 UNIT/ML Solostar Pen Inject 50 Units into the skin every evening. 100UNITS/3 ML     JARDIANCE  25 MG TABS tablet Take 25 mg by mouth daily.     losartan  (COZAAR ) 25 MG tablet Take 12.5 mg by mouth daily.     Magnesium  Oxide -Mg Supplement ([employer] MAGNESIUM ) 500 MG TABS Take 500 mg by mouth daily.     nitroGLYCERIN  (NITROSTAT ) 0.4 MG SL tablet DISSOLVE 1 TABLET UNDER THE  TONGUE EVERY 5 MINUTES AS NEEDED FOR CHEST PAIN. MAX OF 3 TABLETS IN 15 MINUTES. CALL 911 IF PAIN  PERSISTS. 100 tablet 2   Omega-3 Fatty Acids (FISH OIL BURP-LESS) 1000 MG CAPS Take 2,000 mg by mouth daily.     ondansetron  (ZOFRAN ) 4 MG tablet Take 4 mg by mouth every 8 (eight) hours as needed for nausea or vomiting.     pantoprazole  (PROTONIX ) 40 MG tablet Take 40 mg by mouth 2 (two) times daily.     ranolazine  (RANEXA ) 500 MG 12 hr tablet Take 1 tablet (500 mg total) by mouth 2 (two) times daily. 180 tablet 3   Semaglutide, 1 MG/DOSE, (OZEMPIC, 1 MG/DOSE,) 2 MG/1.5ML SOPN Inject 1 mg into the skin every Tuesday.     Vitamin D, Cholecalciferol, 25 MCG (1000 UT) CAPS Take 1,000 Units by mouth at bedtime.     No current facility-administered medications for this visit.    Allergies:   Contrast media [iodinated contrast media], Integrilin [eptifibatide], Codeine, Wound dressing adhesive, Zithromax [azithromycin], and Glipizide   Social History:  The patient  reports that he has never smoked. He has never used smokeless tobacco. He reports that he does not drink alcohol and does not use drugs.   Family History:  The patient's family history includes Coronary artery disease in his father; Heart  disease in his brother and sister; Hyperlipidemia in his father and mother; Hypertension in his father and mother; Stroke in his mother.  ROS:  Please see the history of present illness.    All other systems are reviewed and otherwise negative.   PHYSICAL EXAM:  VS:  There were no vitals taken for this visit. BMI: There is no height or weight on file to calculate BMI. Well nourished, well developed, in no acute distress HEENT: normocephalic, atraumatic Neck: no JVD, carotid bruits or masses Cardiac: RRR; no significant murmurs, no rubs, or gallops Lungs: CTA b/l, no wheezing, rhonchi or rales Abd: soft, nontender MS: no deformity or atrophy Ext: no edema Skin: warm and dry, no rash Neuro:  No gross deficits appreciated Psych: euthymic mood, full affect  PPM site is stable, no tethering or discomfort   EKG:  not done today 09/02/23: SR/V paced (RBBB morphology)  Device interrogation done today and reviewed by myself:  Battery est is 72mo to ERI lead measurements are stable RV lead threshold is 1.5/0.4 and 1.0/0.8, PW is increased to 0.78ms He is dependent and symptomatic with LOC  No arrhythmias   ECHO 07/01/2023:: Normal LVEF of 60 to 65%.  No RWMA.  Mild septal hypertrophy.  GR 1 DD.  Normal PAP and RAP.  Normal valves.  EKG shows paced rhythm versus bundle branch block CATH 04/28/2023: Progression of native disease-occluded RCA stent as well as mid LAD at LIMA insertion.  Widely patent pedicled LIMA to LAD and pedicled RIMA to LPDA with flush  occlusion of sequential left radial. Staged PCI 05/21/2023: Successful Left Main-Ostial to Proximal LCx DES PCI with score flex PTCA pretreatment using a Onyx Frontier DES 4.0 mm x 30 mm postdilated to 4.6 mm. 80% lesion reduced to 0% with TIMI-3 flow. Sidebranch flow in the LAD maintained.    10/03/21: stress myoview   The study is normal. The study is low risk.   No ST deviation was noted.   LV perfusion is normal.   Left ventricular  function is normal. Nuclear stress EF: 62 %. The left ventricular ejection fraction is normal (55-65%). End diastolic cavity size is normal.   Prior study available for comparison from 08/20/2018.   Low risk stress nuclear study with normal perfusion and normal left ventricular regional and global systolic function.     08/27/19: limited echo IMPRESSIONS  1. No significant pericardial effusion noted. Likely prominent prominent  epicardial fat pad. Given limited views - EF appears normal RV is normal  size.    08/21/19: TTE  1. Left ventricular ejection fraction, by estimation, is 55 to 60%. The  left ventricle has normal function. Left ventricular endocardial border  not optimally defined to evaluate regional wall motion. There is moderate  concentric left ventricular  hypertrophy. Left ventricular diastolic parameters are consistent with  Grade I diastolic dysfunction (impaired relaxation).   2. Right ventricular systolic function is normal. The right ventricular  size is normal. There is borderline elevated pulmonary artery systolic  pressure. The estimated right ventricular systolic pressure is 34.8 mmHg.   3. The mitral valve is normal in structure. Trivial mitral valve  regurgitation. No evidence of mitral stenosis.   4. Tricuspid valve regurgitation is mild to moderate.   5. The aortic valve is normal in structure. Aortic valve regurgitation is  not visualized. No aortic stenosis is present.   6. The inferior vena cava is normal in size with greater than 50%  respiratory variability, suggesting right atrial pressure of 3 mmHg.   Comparison(s): No prior Echocardiogram.    08/20/2019: LHC The left ventricular systolic function is normal. The left ventricular ejection fraction is 55-65% by visual estimate. LV end diastolic pressure is normal.LV end diastolic pressure is normal. ------------------- There is stent from ostial to almost distal small nondominant RCA: Ost RCA to Mid  RCA stent is diffusely 35% stenosed. Ost Cx to Prox Cx lesion is 60% stenosed. Ost LAD to Mid LAD stent is 10% stenosed. Mid LAD to Dist LAD lesion is 60% stenosed. Previously placed 2nd Mrg stent (DES) is widely patent. Lat 2nd Mrg lesion is 80% stenosed. Previously described. Previously placed 1st LPL stent (DES) is widely patent. LPAV lesion is 50% stenosed. LPDA stent is 5% stenosed.   Multivessel CAD with essentially patent stents in proximal to mid LAD, proximal OM1, proximal LPL1, several stents in the L PDA as well as RCA that is a small nondominant vessel.  No obvious lesions within stents noted. Ostial LCx roughly 60% --> highly DFR positive with 0.84 Long mid LAD 60% after stent--highly DFR +0.67-0.73 Normal LVEDP   With significant LAD as well as ostial LCx disease--best course of action is CVTS consultation for CABG.   Recent Labs: 04/25/2023: ALT 20 06/10/2023: BUN 29; Creatinine, Ser 1.82; Hemoglobin 15.6; Platelets 130; Potassium 4.3; Sodium 140  04/26/2023: Cholesterol 77; HDL 39; LDL Cholesterol NEG 4; Total CHOL/HDL Ratio 2.0; Triglycerides 208; VLDL 42   CrCl cannot be calculated (Patient's most recent lab result is older than the maximum 21 days  allowed.).   Wt Readings from Last 3 Encounters:  09/02/23 198 lb 9.6 oz (90.1 kg)  06/10/23 202 lb 6.4 oz (91.8 kg)  05/21/23 194 lb (88 kg)     Other studies reviewed: Additional studies/records reviewed today include: summarized above  ASSESSMENT AND PLAN:  CRT-P CHB Intact function  RV lead PW increased as above to keep 2:1 safety He is dependent Nearing ERI  CAD No new or anginal sounding symptoms On Plavix , statin, BB C/w Dr. Huston  HTN Looks good  4.  ICM Recovered LVEF    Disposition: F/u with remotes as usual an in clinic in 23mo, sooner if needed, he is aware of Dr. Adrian upcoming retirement, will decide new EP MD at his next visit, perhaps Dr. Almetta    Current medicines are  reviewed at length with the patient today.  The patient did not have any concerns regarding medicines.  Bonney Charlies Arthur, PA-C 11/04/2023 11:33 AM     [facility] [address] [place] [address] [phone] (office)  [phone] (fax)

## 2023-11-05 ENCOUNTER — Other Ambulatory Visit: Payer: Self-pay | Admitting: Cardiology

## 2023-11-07 ENCOUNTER — Encounter: Payer: Self-pay | Admitting: Physician Assistant

## 2023-11-07 ENCOUNTER — Ambulatory Visit: Attending: Physician Assistant | Admitting: Physician Assistant

## 2023-11-07 VITALS — BP 130/80 | HR 60 | Ht 68.0 in | Wt 198.8 lb

## 2023-11-07 DIAGNOSIS — I10 Essential (primary) hypertension: Secondary | ICD-10-CM | POA: Diagnosis not present

## 2023-11-07 DIAGNOSIS — I2510 Atherosclerotic heart disease of native coronary artery without angina pectoris: Secondary | ICD-10-CM | POA: Diagnosis not present

## 2023-11-07 DIAGNOSIS — Z950 Presence of cardiac pacemaker: Secondary | ICD-10-CM | POA: Diagnosis not present

## 2023-11-07 DIAGNOSIS — I255 Ischemic cardiomyopathy: Secondary | ICD-10-CM

## 2023-11-07 LAB — CUP PACEART INCLINIC DEVICE CHECK
Battery Remaining Longevity: 9 mo
Battery Voltage: 2.7 V
Brady Statistic AP VP Percent: 4.56 %
Brady Statistic AP VS Percent: 0 %
Brady Statistic AS VP Percent: 95.44 %
Brady Statistic AS VS Percent: 0 %
Brady Statistic RA Percent Paced: 4.53 %
Brady Statistic RV Percent Paced: 100 %
Date Time Interrogation Session: 20250711133025
Implantable Lead Connection Status: 753985
Implantable Lead Connection Status: 753985
Implantable Lead Connection Status: 753985
Implantable Lead Implant Date: 20180315
Implantable Lead Implant Date: 20180315
Implantable Lead Implant Date: 20180315
Implantable Lead Location: 753858
Implantable Lead Location: 753859
Implantable Lead Location: 753860
Implantable Lead Model: 4076
Implantable Lead Model: 4298
Implantable Lead Model: 5076
Implantable Pulse Generator Implant Date: 20180315
Lead Channel Impedance Value: 285 Ohm
Lead Channel Impedance Value: 304 Ohm
Lead Channel Impedance Value: 380 Ohm
Lead Channel Impedance Value: 418 Ohm
Lead Channel Impedance Value: 418 Ohm
Lead Channel Impedance Value: 418 Ohm
Lead Channel Impedance Value: 513 Ohm
Lead Channel Impedance Value: 513 Ohm
Lead Channel Impedance Value: 532 Ohm
Lead Channel Impedance Value: 665 Ohm
Lead Channel Impedance Value: 684 Ohm
Lead Channel Impedance Value: 760 Ohm
Lead Channel Impedance Value: 779 Ohm
Lead Channel Impedance Value: 874 Ohm
Lead Channel Pacing Threshold Amplitude: 1.25 V
Lead Channel Pacing Threshold Amplitude: 1.375 V
Lead Channel Pacing Threshold Amplitude: 1.5 V
Lead Channel Pacing Threshold Pulse Width: 0.4 ms
Lead Channel Pacing Threshold Pulse Width: 0.4 ms
Lead Channel Pacing Threshold Pulse Width: 0.9 ms
Lead Channel Sensing Intrinsic Amplitude: 0.875 mV
Lead Channel Sensing Intrinsic Amplitude: 0.875 mV
Lead Channel Sensing Intrinsic Amplitude: 19.75 mV
Lead Channel Sensing Intrinsic Amplitude: 19.75 mV
Lead Channel Setting Pacing Amplitude: 2 V
Lead Channel Setting Pacing Amplitude: 2.5 V
Lead Channel Setting Pacing Amplitude: 2.5 V
Lead Channel Setting Pacing Pulse Width: 0.8 ms
Lead Channel Setting Pacing Pulse Width: 0.9 ms
Lead Channel Setting Sensing Sensitivity: 4 mV
Zone Setting Status: 755011
Zone Setting Status: 755011

## 2023-11-07 NOTE — Patient Instructions (Addendum)
 Medication Instructions:   Your physician recommends that you continue on your current medications as directed. Please refer to the Current Medication list given to you today.   *If you need a refill on your cardiac medications before your next appointment, please call your pharmacy*   Lab Work: NONE ORDERED  TODAY    If you have labs (blood work) drawn today and your tests are completely normal, you will receive your results only by: MyChart Message (if you have MyChart) OR A paper copy in the mail If you have any lab test that is abnormal or we need to change your treatment, we will call you to review the results.   Testing/Procedures: NONE ORDERED  TODAY    Follow-Up: At [facility], you and your health needs are our priority.  As part of our continuing mission to provide you with exceptional heart care, our providers are all part of one team.  This team includes your primary Cardiologist (physician) and Advanced Practice Providers or APPs (Physician Assistants and Nurse Practitioners) who all work together to provide you with the care you need, when you need it.  Your next appointment:    6 month(s)   Provider:    Mertha Abrahams, PA-C    We recommend signing up for the patient portal called "MyChart".  Sign up information is provided on this After Visit Summary.  MyChart is used to connect with patients for Virtual Visits (Telemedicine).  Patients are able to view lab/test results, encounter notes, upcoming appointments, etc.  Non-urgent messages can be sent to your provider as well.   To learn more about what you can do with MyChart, go to [email or web address].   Other Instructions

## 2023-12-12 ENCOUNTER — Ambulatory Visit (INDEPENDENT_AMBULATORY_CARE_PROVIDER_SITE_OTHER)

## 2023-12-12 DIAGNOSIS — I442 Atrioventricular block, complete: Secondary | ICD-10-CM | POA: Diagnosis not present

## 2023-12-15 LAB — CUP PACEART REMOTE DEVICE CHECK
Battery Remaining Longevity: 6 mo
Battery Voltage: 2.67 V
Brady Statistic AP VP Percent: 9.97 %
Brady Statistic AP VS Percent: 0 %
Brady Statistic AS VP Percent: 90.03 %
Brady Statistic AS VS Percent: 0 %
Brady Statistic RA Percent Paced: 9.92 %
Brady Statistic RV Percent Paced: 100 %
Date Time Interrogation Session: 20250816021220
Implantable Lead Connection Status: 753985
Implantable Lead Connection Status: 753985
Implantable Lead Connection Status: 753985
Implantable Lead Implant Date: 20180315
Implantable Lead Implant Date: 20180315
Implantable Lead Implant Date: 20180315
Implantable Lead Location: 753858
Implantable Lead Location: 753859
Implantable Lead Location: 753860
Implantable Lead Model: 4076
Implantable Lead Model: 4298
Implantable Lead Model: 5076
Implantable Pulse Generator Implant Date: 20180315
Lead Channel Impedance Value: 266 Ohm
Lead Channel Impedance Value: 304 Ohm
Lead Channel Impedance Value: 380 Ohm
Lead Channel Impedance Value: 418 Ohm
Lead Channel Impedance Value: 418 Ohm
Lead Channel Impedance Value: 418 Ohm
Lead Channel Impedance Value: 513 Ohm
Lead Channel Impedance Value: 513 Ohm
Lead Channel Impedance Value: 513 Ohm
Lead Channel Impedance Value: 684 Ohm
Lead Channel Impedance Value: 684 Ohm
Lead Channel Impedance Value: 779 Ohm
Lead Channel Impedance Value: 798 Ohm
Lead Channel Impedance Value: 817 Ohm
Lead Channel Pacing Threshold Amplitude: 1.25 V
Lead Channel Pacing Threshold Amplitude: 1.375 V
Lead Channel Pacing Threshold Amplitude: 2.75 V
Lead Channel Pacing Threshold Pulse Width: 0.4 ms
Lead Channel Pacing Threshold Pulse Width: 0.4 ms
Lead Channel Pacing Threshold Pulse Width: 0.9 ms
Lead Channel Sensing Intrinsic Amplitude: 0.875 mV
Lead Channel Sensing Intrinsic Amplitude: 0.875 mV
Lead Channel Sensing Intrinsic Amplitude: 19.75 mV
Lead Channel Sensing Intrinsic Amplitude: 19.75 mV
Lead Channel Setting Pacing Amplitude: 2 V
Lead Channel Setting Pacing Amplitude: 2.5 V
Lead Channel Setting Pacing Amplitude: 2.5 V
Lead Channel Setting Pacing Pulse Width: 0.8 ms
Lead Channel Setting Pacing Pulse Width: 0.9 ms
Lead Channel Setting Sensing Sensitivity: 4 mV
Zone Setting Status: 755011
Zone Setting Status: 755011

## 2023-12-16 ENCOUNTER — Ambulatory Visit: Payer: Self-pay | Admitting: Internal Medicine

## 2024-01-15 ENCOUNTER — Telehealth: Payer: Self-pay

## 2024-01-15 ENCOUNTER — Ambulatory Visit

## 2024-01-15 LAB — CUP PACEART REMOTE DEVICE CHECK
Battery Remaining Longevity: 5 mo
Battery Voltage: 2.64 V
Brady Statistic AP VP Percent: 26.76 %
Brady Statistic AP VS Percent: 0 %
Brady Statistic AS VP Percent: 73.24 %
Brady Statistic AS VS Percent: 0 %
Brady Statistic RA Percent Paced: 26.66 %
Brady Statistic RV Percent Paced: 100 %
Date Time Interrogation Session: 20250918022537
Implantable Lead Connection Status: 753985
Implantable Lead Connection Status: 753985
Implantable Lead Connection Status: 753985
Implantable Lead Implant Date: 20180315
Implantable Lead Implant Date: 20180315
Implantable Lead Implant Date: 20180315
Implantable Lead Location: 753858
Implantable Lead Location: 753859
Implantable Lead Location: 753860
Implantable Lead Model: 4076
Implantable Lead Model: 4298
Implantable Lead Model: 5076
Implantable Pulse Generator Implant Date: 20180315
Lead Channel Impedance Value: 285 Ohm
Lead Channel Impedance Value: 323 Ohm
Lead Channel Impedance Value: 399 Ohm
Lead Channel Impedance Value: 399 Ohm
Lead Channel Impedance Value: 437 Ohm
Lead Channel Impedance Value: 437 Ohm
Lead Channel Impedance Value: 532 Ohm
Lead Channel Impedance Value: 532 Ohm
Lead Channel Impedance Value: 551 Ohm
Lead Channel Impedance Value: 703 Ohm
Lead Channel Impedance Value: 722 Ohm
Lead Channel Impedance Value: 798 Ohm
Lead Channel Impedance Value: 836 Ohm
Lead Channel Impedance Value: 836 Ohm
Lead Channel Pacing Threshold Amplitude: 1.25 V
Lead Channel Pacing Threshold Amplitude: 1.375 V
Lead Channel Pacing Threshold Amplitude: 2.75 V
Lead Channel Pacing Threshold Pulse Width: 0.4 ms
Lead Channel Pacing Threshold Pulse Width: 0.4 ms
Lead Channel Pacing Threshold Pulse Width: 0.9 ms
Lead Channel Sensing Intrinsic Amplitude: 1 mV
Lead Channel Sensing Intrinsic Amplitude: 1 mV
Lead Channel Sensing Intrinsic Amplitude: 19.75 mV
Lead Channel Sensing Intrinsic Amplitude: 19.75 mV
Lead Channel Setting Pacing Amplitude: 2 V
Lead Channel Setting Pacing Amplitude: 2.5 V
Lead Channel Setting Pacing Amplitude: 2.5 V
Lead Channel Setting Pacing Pulse Width: 0.8 ms
Lead Channel Setting Pacing Pulse Width: 0.9 ms
Lead Channel Setting Sensing Sensitivity: 4 mV
Zone Setting Status: 755011
Zone Setting Status: 755011

## 2024-01-15 NOTE — Telephone Encounter (Signed)
 PPM Scheduled remote reviewed. Normal device function.  Presenting rhythm:  AS/BiV pace Possible High LV threshold  Patient is dependent.   In review of transmission concerns for loss of true BVP due to LV threshold measuring 2.75v and hard programmed with monitor on 2.0@.9.   Patient has also noted in past 2 weeks that he has been feeling noticeably more SOB with chest tightness on exertion.  ? If may correlate with RV only pacing.    Patient coming in tomorrow to assess lead at 11am.

## 2024-01-16 ENCOUNTER — Ambulatory Visit: Attending: Cardiology

## 2024-01-16 ENCOUNTER — Other Ambulatory Visit: Payer: Self-pay

## 2024-01-16 ENCOUNTER — Emergency Department (HOSPITAL_COMMUNITY)

## 2024-01-16 ENCOUNTER — Emergency Department (HOSPITAL_COMMUNITY)
Admission: EM | Admit: 2024-01-16 | Discharge: 2024-01-16 | Disposition: A | Discharge status: Home / Self Care | Attending: Emergency Medicine | Admitting: Emergency Medicine

## 2024-01-16 ENCOUNTER — Encounter (HOSPITAL_COMMUNITY): Payer: Self-pay | Admitting: Emergency Medicine

## 2024-01-16 DIAGNOSIS — I129 Hypertensive chronic kidney disease with stage 1 through stage 4 chronic kidney disease, or unspecified chronic kidney disease: Secondary | ICD-10-CM | POA: Insufficient documentation

## 2024-01-16 DIAGNOSIS — I200 Unstable angina: Secondary | ICD-10-CM | POA: Insufficient documentation

## 2024-01-16 DIAGNOSIS — R079 Chest pain, unspecified: Secondary | ICD-10-CM | POA: Diagnosis present

## 2024-01-16 DIAGNOSIS — E785 Hyperlipidemia, unspecified: Secondary | ICD-10-CM

## 2024-01-16 DIAGNOSIS — Z951 Presence of aortocoronary bypass graft: Secondary | ICD-10-CM | POA: Diagnosis not present

## 2024-01-16 DIAGNOSIS — I442 Atrioventricular block, complete: Secondary | ICD-10-CM

## 2024-01-16 DIAGNOSIS — N189 Chronic kidney disease, unspecified: Secondary | ICD-10-CM | POA: Diagnosis not present

## 2024-01-16 DIAGNOSIS — Z79899 Other long term (current) drug therapy: Secondary | ICD-10-CM | POA: Insufficient documentation

## 2024-01-16 DIAGNOSIS — Z7982 Long term (current) use of aspirin: Secondary | ICD-10-CM | POA: Insufficient documentation

## 2024-01-16 DIAGNOSIS — E1122 Type 2 diabetes mellitus with diabetic chronic kidney disease: Secondary | ICD-10-CM | POA: Insufficient documentation

## 2024-01-16 DIAGNOSIS — I2510 Atherosclerotic heart disease of native coronary artery without angina pectoris: Secondary | ICD-10-CM

## 2024-01-16 DIAGNOSIS — I25119 Atherosclerotic heart disease of native coronary artery with unspecified angina pectoris: Secondary | ICD-10-CM

## 2024-01-16 DIAGNOSIS — N1832 Chronic kidney disease, stage 3b: Secondary | ICD-10-CM

## 2024-01-16 DIAGNOSIS — I10 Essential (primary) hypertension: Secondary | ICD-10-CM | POA: Diagnosis not present

## 2024-01-16 LAB — CBC
HCT: 51.1 % (ref 39.0–52.0)
Hemoglobin: 16.4 g/dL (ref 13.0–17.0)
MCH: 29.3 pg (ref 26.0–34.0)
MCHC: 32.1 g/dL (ref 30.0–36.0)
MCV: 91.3 fL (ref 80.0–100.0)
Platelets: 125 K/uL — ABNORMAL LOW (ref 150–400)
RBC: 5.6 MIL/uL (ref 4.22–5.81)
RDW: 13.8 % (ref 11.5–15.5)
WBC: 7.8 K/uL (ref 4.0–10.5)
nRBC: 0 % (ref 0.0–0.2)

## 2024-01-16 LAB — TROPONIN I (HIGH SENSITIVITY)
Troponin I (High Sensitivity): 8 ng/L (ref <18)
Troponin I (High Sensitivity): 8 ng/L (ref <18)

## 2024-01-16 LAB — BASIC METABOLIC PANEL WITH GFR
Anion gap: 8 (ref 5–15)
BUN: 21 mg/dL (ref 8–23)
CO2: 28 mmol/L (ref 22–32)
Calcium: 9.4 mg/dL (ref 8.9–10.3)
Chloride: 102 mmol/L (ref 98–111)
Creatinine, Ser: 1.95 mg/dL — ABNORMAL HIGH (ref 0.61–1.24)
GFR, Estimated: 38 mL/min — ABNORMAL LOW (ref >60)
Glucose, Bld: 131 mg/dL — ABNORMAL HIGH (ref 70–99)
Potassium: 4.2 mmol/L (ref 3.5–5.1)
Sodium: 138 mmol/L (ref 135–145)

## 2024-01-16 LAB — CBG MONITORING, ED
Glucose-Capillary: 77 mg/dL (ref 70–99)
Glucose-Capillary: 62 mg/dL — ABNORMAL LOW (ref 70–99)
Glucose-Capillary: 75 mg/dL (ref 70–99)

## 2024-01-16 MED ORDER — ISOSORBIDE MONONITRATE ER 30 MG PO TB24: 15.0000 mg | ORAL_TABLET | Freq: Every day | ORAL | 0 refills | Status: DC

## 2024-01-16 MED ORDER — AMLODIPINE BESYLATE 5 MG PO TABS: 5.0000 mg | ORAL_TABLET | Freq: Every day | ORAL | 0 refills | Status: DC

## 2024-01-16 MED ORDER — NITROGLYCERIN 0.4 MG SL SUBL
0.4000 mg | SUBLINGUAL_TABLET | SUBLINGUAL | Status: DC | PRN
  Administered 2024-01-16 (×2): 0.4 mg via SUBLINGUAL
  Filled 2024-01-16: qty 1

## 2024-01-16 MED ORDER — MORPHINE SULFATE (PF) 2 MG/ML IV SOLN
2.0000 mg | Freq: Once | INTRAVENOUS | Status: AC
  Administered 2024-01-16: 2 mg via INTRAVENOUS
  Filled 2024-01-16: qty 1

## 2024-01-16 NOTE — ED Notes (Signed)
The doctor has been notified

## 2024-01-16 NOTE — ED Notes (Signed)
 The edp ordered food and drink pt alert and oriented

## 2024-01-16 NOTE — Progress Notes (Addendum)
 Patient brought in to clinic today to evaluate LV lead function due to recent threshold failure testing alert. This has been an ongoing trend; however, determined best to bring in and verify programming for the lead is appropriate and no concerns for lead issue.   In clinic today, reports he has been having chest pain intermittently X 2 weeks with exertion.  Woke up in the middle of the night with 8 out of 10 chest pain, dizziness, and pressure in center of chest. Initially only occurring with exertion, now progressing to at rest as well.    During LV lead testing with pacing patient at a higher heart rate, he became slightly pale, near syncopal with severe chest pain and pressure.  Testing immediately ceased, with apparent capture ongoing at 3.0v@0 .9ms.  Attempted to hard program at 3.0@0 .9 ms until could be evaluated further;however, this triggered again severe angina and lightheadedness.  Device programmed back to prior LV settings.   Discussed with A. Tillery PA-C and Dr. Elmira who is gen cards doc of the day.  Patient has significant hx of CAD, NSTEMI, CABG recent complicated stent placement in 04/2023.  With active chest pain and progressing symptoms felt appropriate to send via EMS to ER for further work up.  Before leaving with EMS at 1210 today, patient reports constant chest pressure feels like elephant now is sitting on my chest. Feels like when I had my last heart attack.    Will re-evalaute LV lead further after structural heart concerns have been evaluated and patient has stabilized.  VS: 160/84, presenting rhythm: AS/BVP 60. Underlying: AS 60/VP 30 Dependent. O2 sat 100%.

## 2024-01-16 NOTE — Consult Note (Signed)
 Cardiology Consultation   Patient ID: John Dodson MRN: [id number]; DOB: 12/18/1960  Admit date: 01/16/2024 Date of Consult: 01/16/2024  PCP:  Delilah Murray HERO., MD   [facility] Providers Cardiologist:  Alm Clay, MD  Electrophysiologist:  Danelle Birmingham, MD     Patient Profile: John Dodson is a 63 y.o. male with a hx of CAD s/p four-vessel CABG (LIMA to LAD, left radial to OM 2/PDA, RIMA to LDPA), complete heart block status post BiV PPM '18, hypertension, hyperlipidemia, PE following COVID-19 infection, CKD stage III s/p right nephrectomy, diabetes who is being seen 01/16/2024 for the evaluation of chest pain at the request of Dr. Cleotilde.  History of Present Illness: John Dodson is a 63 year old male with past medical history noted above.  Multiple PCI's with ultimate four-vessel CABG 07/2019 with LIMA to LAD, left radial to OM 2/PDA, RIMA-LDPA.  He recently underwent PCI from the ostial left main into left circumflex in January 2025.  Recommended DAPT for 6 months and then Plavix  monotherapy lifelong.  He apparently had inability to tolerate GDMT for CAD.  Current regimen includes carvedilol  12.5 mg twice daily amlodipine  2.5 mg daily.  He has had headaches with Imdur  in the past.  Now on Ranexa  1000 mg twice daily.  He was in the office today to evaluate his LV lead dysfunction.  Apparently when the test started he developed chest tightness and had a near syncopal episode.  He tells me whenever they have evaluated his LV lead he does get dizzy and lightheaded and has passed out.  He reports that he has had no further chest pain since being in the ER.  He does report over the past few weeks has had exertional chest pressure.  He is also had chest tightness at rest.  Symptoms can last seconds to minutes.  Has not taken nitroglycerin  but 1 time.  Symptoms seem to resolve after a short period.  He was given 2 sublingual nitroglycerin  in the ER and morphine  and  symptoms have resolved.  At the time my examination vitals are stable.  EKG shows sinus rhythm with BiV paced QRS complexes.  Labs are notable for serum creatinine of 1.9 eGFR 38.  Initial troponin 8.  Repeat pending.  Hemoglobin 16.4.  WBC and platelets are within limits.   Past Medical History:  Diagnosis Date   Blepharitis of both eyes    Chronic   Cataracts, both eyes    CKD (chronic kidney disease) stage 3, GFR 30-59 ml/min (HCC)    s/p R Nephrectomy/adrenalectomy & DM-Nephropathy (baseline creatinine 1.4-1.5)   COVID-19 virus infection 04/25/2019   Diabetic peripheral neuropathy associated with type 2 diabetes mellitus (HCC)    Essential hypertension    GERD without esophagitis    History of basal cell carcinoma (BCC) excision    History of complete heart block 06/2016   s/p BiV PPM (now followed by Dr. Tayolor - EP)   History of non-ST elevation myocardial infarction (NSTEMI)    And several occasions of unstable angina   Hx of CABGx4 08/25/2019   (Dr. Diamantina, [facility]): LIMA-LAD, Seq Rad- OM2-LPDA, RIMA-? OM1 (called Ramus on report, but no ramus seen on cath).   Hyperlipidemia associated with type 2 diabetes mellitus (HCC)    on atorvastatin  40 mg.   Multiple Vessel CAD -- s/p PCI of LAD, OM1, LPL1, LPDA & non-dominant RCA --> now S/P CABG x 4 60%   (No cath report prior to 2016) (stents in proxLAD,  prox OM1 x 2, prox LPL1 & 3-4 in L PDA & ~2 in  Known non-dominant RCA;  07/2019: MV CAD - patent stents in p-m LAD, p OM1, p LPL & LPDA as well as RCA -> Ost LCx ~60-70% (DFR ++), long mLAD 60% post-stent (DFR ++) --> referred for CABG   Obesity (BMI 30.0-34.9)    Occlusion of left vertebral artery    Consider repossible acute lesion in August 2020 with presentation of headache.  CTA suggested occluded left vertebral artery throughout the neck.  Faint string-like enhancement intermittently visible suggesting recent occlusion.  Partial reconstruction and posterior fossa.  Left PICA  patent.  (Consider possible left vertebral artery dissection)   OSA treated with BiPAP 02/2019   Diagnosis in late 2020 ([facility])--> delayed onset of treatment with BiPAP due to Covid hospitalization followed by PE. ->  BiPAP setting at 21/17 cm   Pulmonary embolism (HCC) 05/18/2019   (1 month following COVID-19 infection): DVT-PE (bilateral PEs noted on VQ scan)-> started on warfarin.  (On warfarin plus Plavix  now with aspirin  stopped.)   Type 2 diabetes mellitus with complication, with long-term current use of insulin  (HCC)    On Lantus , Jardiance , Metformin & Onglyza    Past Surgical History:  Procedure Laterality Date   BASAL CELL CARCINOMA EXCISION  01/2015   CAROTID DUPLEX SCAN  12/25/2018   [facility]: Mild plaque in both carotids.  139% bilateral.  Right vertebral flow normal antegrade, Left not seen; normal bilateral subclavian flow..   CHOLECYSTECTOMY     CORONARY ARTERY BYPASS GRAFT N/A 08/25/2019   Procedure: CORONARY ARTERY BYPASS GRAFTING (CABG) TIMES FOUR, ON PUMP, USING LEFT AND RIGHT INTERNAL MAMMARY ARTERY AND LEFT RADIAL ARTERY;  Surgeon: German Bartlett PEDLAR, MD;  Location: [facility] OR;  Service: Open Heart Surgery;; LIMA-LAD, Seq Rad OM2-LPDA, RIMA-OM1   CORONARY STENT INTERVENTION  04/10/2015   [facility] [place]; Alverna Sieving, DO) -> DES PCI mLPDA: Xience Alpine DES 2.5 mm x 18 mm, Xience Alpine DES 2.25 mm x 12 mm overlapping.   CORONARY STENT INTERVENTION  04/04/2016   [facility] [place]; [place], MD)--> (urgent) 100% mLPDA PTCA with 2.25 mm balloon - reduced to 50%; mid OM2 90% - DES PCI (Xience DES  2.5 x 18).    CORONARY STENT INTERVENTION  2015   Prior to December 2016, STENTS noted in prox LAD, proximal OM1, and at least 2 stents in proximal L PDA   CORONARY STENT INTERVENTION  11/17/2018   [facility] [place]; Alverna Sieving, DO): CULPRIT: mid RCA 90% (DES PCI) -Resolute Onyx DES 2.5 mm x 30 mm --> COMPLICATION: Large left arm  hematoma-evaluated by vascular and orthopedic surgery, managed with splint and arm elevation.   CORONARY STENT INTERVENTION  12/28/2018   [facility] [place]; Elspeth Kitten, MD, referred by Dr. Lynwood Landing) --> INDICATION (urgent, Unstable Angina): Moderate-severe (70%) stenosis of ostial RCA  -> DES PCI Resolute Onyx DES 2.5 mm x 12 mm.    CORONARY STENT INTERVENTION N/A 05/21/2023   Procedure: CORONARY STENT INTERVENTION;  Surgeon: Anner Alm ORN, MD;  Location: [facility] INVASIVE CV LAB;  Service: Cardiovascular;  Laterality: N/A;   LEFT HEART CATH N/A 05/21/2023   Procedure: Left Heart Cath;  Surgeon: Anner Alm ORN, MD;  Location: [facility] INVASIVE CV LAB;  Service: Cardiovascular;  Laterality: N/A;   LEFT HEART CATH AND CORONARY ANGIOGRAPHY  04/10/2015   [facility] [place]; Alverna Sieving, DO)--> EF 55%.  Mild inferior HK.  Coronaries-LM:  Normal, p LAD STENT 30% ISR, mLAD 20% & diffuse dLAD ~20%; Dom LCx: mCx 20%, ~pOM1 STENT (noted as OM2 in other reports) patent with mid 30%, OM2 normal, prox LPDA STENTS patent with SEVERE mid L PDA 90-95% (DES PCI x 2 overlapping); Small-non-dominant RCA  patent    LEFT HEART CATH AND CORONARY ANGIOGRAPHY  04/04/2016   [facility] [place]; [place], MD)--> (urgent): EF 70%.  Previous LAD,~OM2 and proximal PDA stents patent; -> mLAD 35%, dLAD 40%; LCx-OM1 75% (short lesion, med Rx), mid OM2 90% (DES PCI), pLPDA stents patent w/ mPDA 100% (PTCA only - reduced to 50%); non-dom RCA - ost RCA 60% & mRCA 75%   LEFT HEART CATH AND CORONARY ANGIOGRAPHY  11/17/2018   [facility] [place]; Alverna Sieving, DO) indication: Angina.  LM normal; LAD - ost LAD ~50%, pLAD STENT ~30% ISR with dLAD ~40%; Dom LCx - ost & prox Cx 30%, OM1 STENT 20% ISR, OM2 STENT 50% distal edge, pLPDA overlapping STENTS ~20%; nonDom RCA - proxRCA 40%, mid 90% (DES PCI)   LEFT HEART CATH AND CORONARY ANGIOGRAPHY  12/28/2018   [facility] [place]; Elspeth Kitten, MD, referred by Dr.  Lynwood McGukin) --> INDICATION (urgent, Unstable Angina): Moderate-severe (70%) stenosis of ostial RCA (DES PCI), mRCA 30%.  Otherwise no significant change from July 2020: dLM 20%, pLAD ~40% ISR , dLAD long/diffuse ~50%; OstLCx 30%, OM1 ~15% OM2 ~30% with patent  LPDA stents/PTCA site.    LEFT HEART CATH AND CORONARY ANGIOGRAPHY N/A 08/20/2019   Procedure: LEFT HEART CATH AND CORONARY ANGIOGRAPHY;  Surgeon: Anner Alm ORN, MD;  Location: [facility] INVASIVE CV LAB;  Service: Cardiovascular;; Multivessel CAD: Patent stents in proximal to mid LAD, proximal OM1, proximal LPL 1 and several stents in L PDA as well as RCA (nondominant).  Ostial LCx 60%-highly DFR +0.84.  There is a long mid LAD 60% stenosis after the stent--highly DFR +0.67.--> CABG   LEFT HEART CATH AND CORS/GRAFTS ANGIOGRAPHY N/A 04/28/2023   Procedure: LEFT HEART CATH AND CORS/GRAFTS ANGIOGRAPHY;  Surgeon: Anner Alm ORN, MD;  Location: [facility] INVASIVE CV LAB;  Service: Cardiovascular;  Laterality: N/A;   NEPHRECTOMY Right 2012   With adrenalectomy   NM MYOVIEW  LTD  08/20/2018   [facility] -> Lexiscan  Myoview : EF 81%.  No reversible ischemia or infarction.  Normal wall motion.   PACEMAKER IMPLANT  06/2016   [facility] [place] ([employer]) -> according to CT of chest, leads positioned in right atrium, cardiac apex and coronary sinus (suggesting CRT-P - BiV Pacing))   RADIAL ARTERY HARVEST Left 08/25/2019   Procedure: RADIAL ARTERY HARVEST;  Surgeon: German Bartlett PEDLAR, MD;  Location: [facility] OR;  Service: Open Heart Surgery;  Laterality: Left;   TEE WITHOUT CARDIOVERSION N/A 08/25/2019   Procedure: TRANSESOPHAGEAL ECHOCARDIOGRAM (TEE);  Surgeon: German Bartlett PEDLAR, MD;  Location: [facility];  Service: Open Heart Surgery;  Laterality: N/A;   TRANSTHORACIC ECHOCARDIOGRAM  11/2018; 05/01/2019   [facility]) a) EF 60-65%.  Mild TR.;; b)moderate concentric LVH.  EF 55 to 60%.  No R WMA.  Normal RV size and function.  Normal  atrial sizes.  Normal valves.     Scheduled Meds:  Continuous Infusions:  PRN Meds: nitroGLYCERIN   Allergies:    Allergies  Allergen Reactions   Contrast Media [Iodinated Contrast Media] Rash and Other (See Comments)    Reportedly cardiac arrest   Integrilin [Eptifibatide] Shortness Of Breath and Other (See Comments)    Reportedly had  shortness of breath, confusion.   Codeine Nausea And Vomiting   Wound Dressing Adhesive Hives   Zithromax [Azithromycin] Nausea And Vomiting   Glipizide Rash and Other (See Comments)    Headache    Social History:   Social History   Socioeconomic History   Marital status: Married    Spouse name: Not on file   Number of children: 2   Years of education: Not on file   Highest education level: Not on file  Occupational History   Not on file  Tobacco Use   Smoking status: Never   Smokeless tobacco: Never  Vaping Use   Vaping status: Never Used  Substance and Sexual Activity   Alcohol use: No   Drug use: No   Sexual activity: Not on file  Other Topics Concern   Not on file  Social History Narrative   Not on file   Social Drivers of Health   Financial Resource Strain: Not on file  Food Insecurity: Low Risk  (07/04/2023)   Received from [facility]   Hunger Vital Sign    Within the past 12 months, you worried that your food would run out before you got money to buy more: Never true    Within the past 12 months, the food you bought just didn't last and you didn't have money to get more. : Never true  Transportation Needs: No Transportation Needs (07/04/2023)   Received from [facility]    In the past 12 months, has lack of reliable transportation kept you from medical appointments, meetings, work or from getting things needed for daily living? : No  Physical Activity: Unknown (04/24/2021)   Received from [facility] visits prior to 06/29/2022.   Exercise Vital Sign    On average, how many days  per week do you engage in moderate to strenuous exercise (like a brisk walk)?: 0 days    Minutes of Exercise per Session: Not on file  Stress: No Stress Concern Present (04/24/2021)   Received from [facility] visits prior to 06/29/2022.   [employer] of Occupational Health - Occupational Stress Questionnaire    Feeling of Stress : Not at all  Social Connections: Moderately Integrated (04/24/2021)   Received from [facility] visits prior to 06/29/2022.   Social Connection and Isolation Panel    In a typical week, how many times do you talk on the phone with family, friends, or neighbors?: Once a week    How often do you get together with friends or relatives?: Three times a week    How often do you attend church or religious services?: More than 4 times per year    Do you belong to any clubs or organizations such as church groups, unions, fraternal or athletic groups, or school groups?: No    How often do you attend meetings of the clubs or organizations you belong to?: Never    Are you married, widowed, divorced, separated, never married, or living with a partner?: Married  Intimate Partner Violence: Patient Unable To Answer (04/25/2023)   Humiliation, Afraid, Rape, and Kick questionnaire    Fear of Current or Ex-Partner: Patient unable to answer    Emotionally Abused: Patient unable to answer    Physically Abused: Patient unable to answer    Sexually Abused: Patient unable to answer    Family History:    Family History  Problem Relation Age of Onset  Stroke Mother    Hypertension Mother    Hyperlipidemia Mother    Hypertension Father    Hyperlipidemia Father    Coronary artery disease Father    Heart disease Sister    Heart disease Brother      ROS:  Please see the history of present illness.   All other ROS reviewed and negative.     Physical Exam/Data: Vitals:   01/16/24 1236 01/16/24 1324  BP: (!) 144/73   Pulse: 65    Resp: 18   Temp: 98.6 F (37 C)   TempSrc: Oral   SpO2: 100%   Weight:  88 kg  Height:  5' 8 (1.727 m)   No intake or output data in the 24 hours ending 01/16/24 1333    01/16/2024    1:24 PM 11/07/2023    9:50 AM 09/02/2023    9:49 AM  Last 3 Weights  Weight (lbs) 194 lb 198 lb 12.8 oz 198 lb 9.6 oz  Weight (kg) 87.998 kg 90.175 kg 90.084 kg     Body mass index is 29.5 kg/m.  General:  Well nourished, well developed, in no acute distress HEENT: normal Neck: no JVD Vascular: No carotid bruits; Distal pulses 2+ bilaterally Cardiac:  normal S1, S2; RRR; no murmur  Lungs:  clear to auscultation bilaterally Abd: soft, nontender Ext: no edema Musculoskeletal:  No deformities, BUE and BLE strength normal and equal Skin: warm and dry  Neuro: no focal abnormalities noted Psych:  Normal affect   EKG:  The EKG was personally reviewed and demonstrates: Sinus rhythm, 65 bpm V-paced Telemetry:  Telemetry was personally reviewed and demonstrates: Sinus rhythm with V pacing  Relevant CV Studies:  Cath: 04/2023    Mid LM to Ost LAD lesion is 50% stenosed with 80% stenosed side branch in Ost Cx to Prox Cx.  Prox Cx to Mid Cx lesion is 45% stenosed.  TIMI-3 flow   ScoreFlex angioplasty was performed (using sequential balloons from 3.0 mm standard balloon to 3.5 and 4.0 mm Valentine scoring balloons) in the main branch and side branch.   A drug-eluting stent was successfully placed from Ostial Left Main through the proximal LCx 45% lesion using a STENT ONYX FRONTIER 4.0X30.  Postdilated to 4.6 mm.   Post intervention, there is a 0% residual stenosis throughout the stented segment in the LM and LCx.  TIMI-3 flow maintained   Ost LAD to Prox LAD lesion is 60% stenosed -stable post PCI; Mid LAD to Dist LAD lesion is 100% stenosed.   ------------------------------   Lat 2nd Mrg lesion is 80% stenosed.   LPAV lesion is 50% stenosed.  LPDA lesion is 5% stenosed.   1st LPL & 2nd Mrg stents are widely  patent   ------------------------------   LV end diastolic pressure is normal.   There is no aortic valve stenosis.   Successful Left Main-Ostial to Proximal LCx DES PCI with score flex PTCA pretreatment using a Onyx Frontier DES 4.0 mm x 30 mm postdilated to 4.6 mm. 80% lesion reduced to 0% with TIMI-3 flow. Sidebranch flow in the LAD maintained.  LVEDP 16 mmHg.  Okay for post cath hydration   RECOMMENDATION   In the absence of any other complications or medical issues, we expect the patient to be ready for discharge from an interventional cardiology perspective on 05/21/2023.         Will allow for 8 hours post cath hydration given his creatinine of 1.7.   Recommend uninterrupted dual  antiplatelet therapy with Aspirin  81mg  daily and Clopidogrel  75mg  daily for a minimum of 6 months (stable ischemic heart disease-Class I recommendation).   With then continue Plavix  monotherapy (SAPT) for lifelong maintenance therapy.  Alm Clay, MD  Laboratory Data: High Sensitivity Troponin:  No results for input(s): TROPONINIHS in the last 720 hours.   ChemistryNo results for input(s): NA, K, CL, CO2, GLUCOSE, BUN, CREATININE, CALCIUM , MG, GFRNONAA, GFRAA, ANIONGAP in the last 168 hours.  No results for input(s): PROT, ALBUMIN , AST, ALT, ALKPHOS, BILITOT in the last 168 hours. Lipids No results for input(s): CHOL, TRIG, HDL, LABVLDL, LDLCALC, CHOLHDL in the last 168 hours.  HematologyNo results for input(s): WBC, RBC, HGB, HCT, MCV, MCH, MCHC, RDW, PLT in the last 168 hours. Thyroid No results for input(s): TSH, FREET4 in the last 168 hours.  BNPNo results for input(s): BNP, PROBNP in the last 168 hours.  DDimer No results for input(s): DDIMER in the last 168 hours.  Radiology/Studies:  CUP PACEART REMOTE DEVICE CHECK Result Date: 01/15/2024 Monthly battery check.  Estimated 5 mo Normal device function. LV threshold 2.7  volts at 0.9 ms and amplitude is only programmed to 2.0 volts at 0.9 ms, sent to triage. Follow up as scheduled monthly ML, CVRS    Assessment and Plan:  John Dodson is a 63 y.o. male with a hx of CAD s/p four-vessel CABG (LIMA to LAD, left radial to OM 2/PDA, RIMA to LDPA), complete heart block status post BiV PPM '18, hypertension, hyperlipidemia, PE following COVID-19 infection, CKD stage III, diabetes who is being seen 01/16/2024 for the evaluation of chest pain at the request of Dr. Cleotilde.  # Chest pain # CAD status post four-vessel CABG '21 # Recent PCI to the ostial left main into circumflex - Presents with a likely vagal episode while interrogating his LV lead in the office.  Had worsening chest discomfort.  Symptoms have now resolved with nitroglycerin  x 2 and morphine . - Has had exertional chest discomfort as well as chest pain at rest.  Symptoms have been ongoing for the past 2 to 3 weeks.  They last seconds to minutes.  He is only taken nitroglycerin  1 time. - Initial troponin is negative.  Initial EKG is unchanged. - Currently chest pain-free. - He has CKD stage IIIb.  He has complicated contrast allergy to iodinated contrast. - Would recommend to trend a second troponin.  If second troponin is negative he may be discharged with medical therapy.  Will continue aspirin  Plavix  and atorvastatin . - I would recommend to stop his losartan .  Increase his amlodipine  to 5 mg daily and add Imdur  15 mg daily to his regimen.  Continue his carvedilol  12.5 mg daily and Ranexa  1000 mg twice daily.  We will arrange an outpatient follow-up early next week to follow this closely.  I think he merits a trial of intensification of medical therapy.  # CHB s/p ppm - stable  # HTN - as above. Stopping losartan . Increase norvasc  and add imdur .   # Hyperlipidemia -- On atorvastatin  80 mg daily and Repatha   # CKD stage III s/p right nephrectomy  -- Baseline creatinine around 1.5-1.8,  creatinine noted at 1.95 on admission  For questions or updates, please contact [facility] Please consult [email or web address] for contact info under   Signed, Darryle T. Barbaraann, MD, [facility]  [facility]  [address] [place], [place] [address] [phone]  4:18 PM

## 2024-01-16 NOTE — ED Provider Triage Note (Signed)
 Emergency Medicine Provider Triage Evaluation Note  John Dodson , a 63 y.o. male  was evaluated in triage.  Pt complains of chest pain. Recurrent exertional chest pain x 2 weeks.  Had non exertional cp yesterday and also today while he's on his way to see his cardiologist.  Endorse lightheadedness, sob, and nausea with the pain.  Slightly improves with SL nitro  Review of Systems  Positive: As above Negative: As above  Physical Exam  BP (!) 144/73 (BP Location: Right Arm)   Pulse 65   Temp 98.6 F (37 C) (Oral)   Resp 18   SpO2 100%  Gen:   Awake, no distress   Resp:  Normal effort  MSK:   Moves extremities without difficulty  Other:    Medical Decision Making  Medically screening exam initiated at 12:56 PM.  Appropriate orders placed.  [name] was informed that the remainder of the evaluation will be completed by another provider, this initial triage assessment does not replace that evaluation, and the importance of remaining in the ED until their evaluation is complete.  NEXT available room   Nivia Colon, DEVONNA 01/16/24 1258

## 2024-01-16 NOTE — ED Notes (Signed)
Pt reports being pain free at this time.   

## 2024-01-16 NOTE — ED Notes (Signed)
 The pt  is being  seen by the pharmacy person

## 2024-01-16 NOTE — ED Triage Notes (Signed)
 [facility] with reports of wires on his pacemaker was acting funny. Pt reports chest pain at this time. PT was given 324 Asprin and 1 nitroglycerin  PTA. Hx of CABG.

## 2024-01-16 NOTE — ED Notes (Signed)
 The pt is asking for food and we are getting him something to drink  meanwhile

## 2024-01-16 NOTE — Discharge Instructions (Signed)
 Per your cardiologist, I would recommend to stop his losartan . Increase his amlodipine  to 5 mg daily and add Imdur  15 mg daily to his regimen. Continue his carvedilol  12.5 mg daily and Ranexa  1000 mg twice daily..  Prescription were sent to your pharmacy  Cardiology will call you for follow-up next week  Return to ER if you have worse chest pain or shortness of breath

## 2024-01-16 NOTE — ED Provider Notes (Signed)
  Physical Exam  BP 134/64   Pulse 61   Temp 97.6 F (36.4 C) (Oral)   Resp 11   Ht 5' 8 (1.727 m)   Wt 88 kg   SpO2 99%   BMI 29.50 kg/m   Physical Exam  Procedures  Procedures  ED Course / MDM    Medical Decision Making Patient here with chest pain and was supposed to be admitted by cardiology.  I was notified by nursing that cardiology saw patient and recommended discharge. Per cardiology note  I would recommend to stop his losartan .  Increase his amlodipine  to 5 mg daily and add Imdur  15 mg daily to his regimen.  Continue his carvedilol  12.5 mg daily and Ranexa  1000 mg twice daily.  We will arrange an outpatient follow-up early next week to follow this closely.  I think he merits a trial of intensification of medical therapy.  I apologized to him about the delay in sending his prescriptions.  Amount and/or Complexity of Data Reviewed Labs: ordered. Radiology: ordered.  Risk Prescription drug management.          Patt Alm Macho, MD 01/16/24 [phone]

## 2024-01-16 NOTE — ED Provider Notes (Signed)
 [place] [facility] AT [facility] Provider Note   CSN: [id number] Arrival date & time: 01/16/24  1233     Patient presents with: Chest Pain   John Dodson is a 63 y.o. male with PMHX CAD s/p PCI, pulmonary embolism, hyperlipidemia, hypertension, diabetes, s/p CABG with pacemaker who presents to the emergency department for non-exertional chest pain that has worsened over the last 2 weeks. He previously was experiencing exertional angina, but as of last night began having chest pain at rest. He was at his cardiologist for pacemaker testing when he became pale, diaphoretic and began having 8/10 chest pain. The test was stopped and paramedics were called, prompting his arrival to the ED. He was given nitroglycerin  and aspirin  en route and reports his pain is better, but he still endorses chest pain at rest. Wife at bedside.   Chest Pain      Prior to Admission medications   Medication Sig Start Date End Date Taking? Authorizing Provider  Accu-Chek FastClix Lancets MISC Use to check blood sugar 3 times daily 11/04/23   [provider]  ACCU-CHEK GUIDE TEST test strip SMARTSIG:Strip(s)    [provider]  albuterol  (VENTOLIN  HFA) 108 (90 Base) MCG/ACT inhaler Inhale 2 puffs into the lungs every 4 (four) hours as needed for wheezing or shortness of breath.    [provider]  allopurinol (ZYLOPRIM) 300 MG tablet Take 300 mg by mouth daily. 09/23/23   [provider]  amLODipine  (NORVASC ) 2.5 MG tablet Take 1 tablet (2.5 mg total) by mouth daily. 05/05/23 09/02/23  West, Katlyn D, NP  ASHWAGANDHA PO Take 2,100 mg by mouth daily.    [provider]  aspirin  EC 81 MG tablet Take 1 tablet (81 mg total) by mouth daily. Swallow whole. 05/21/23   Trudy Birmingham, PA-C  atorvastatin  (LIPITOR ) 80 MG tablet TAKE 1 TABLET BY MOUTH DAILY 10/28/23   Anner Alm ORN, MD  BD PEN NEEDLE NANO 2ND GEN 32G X 4 MM MISC SMARTSIG:1 Each Every Evening  10/27/23   [provider]  Blood Glucose Monitoring Suppl (ACCU-CHEK GUIDE) w/Device KIT  11/04/23   [provider]  Blood Glucose Monitoring Suppl (GLUCOCOM BLOOD GLUCOSE MONITOR) DEVI See admin instructions. 11/04/23   [provider]  Capsicum, Cayenne, (CAYENNE PEPPER PO) Take 1 capsule by mouth in the morning and at bedtime.    [provider]  carvedilol  (COREG ) 12.5 MG tablet Take 1 tablet (12.5 mg total) by mouth 2 (two) times daily with a meal. 12/17/19   Waddell Danelle ORN, MD  clopidogrel  (PLAVIX ) 75 MG tablet TAKE 1 TABLET BY MOUTH DAILY 09/23/23   Anner Alm ORN, MD  COLLAGEN PO Take 2 tablets by mouth in the morning and at bedtime.    [provider]  Continuous Glucose Receiver (FREESTYLE LIBRE 2 READER) DEVI 1 each by miscellaneous route continuous. 06/04/23   [provider]  Continuous Glucose Sensor (FREESTYLE LIBRE 2 PLUS SENSOR) MISC Use as directed to monitor blood sugar levels. Follow package directions for replacement. 10/23/23   [provider]  Evolocumab  (REPATHA  SURECLICK) 140 MG/ML SOAJ Inject 140 mg into the skin every 14 (fourteen) days. 01/16/23   Anner Alm ORN, MD  furosemide  (LASIX ) 40 MG tablet Take 1 tablet (40 mg total) by mouth daily as needed for fluid or edema (Poor more than 3 pound weight gain overnight or more than 5 pound weight gain over baseline). 04/29/23   Anner Alm ORN, MD  gabapentin (NEURONTIN) 100 MG capsule Take 200 mg by mouth at bedtime.    [provider]  insulin  glargine (LANTUS  SOLOSTAR) 100 UNIT/ML Solostar Pen Inject 50 Units into the skin every evening. 100UNITS/3 ML    [provider]  JARDIANCE  25 MG TABS tablet Take 25 mg by mouth daily. 05/11/20   [provider]  losartan  (COZAAR ) 25 MG tablet Take 12.5 mg by mouth daily. 01/15/21   [provider]  Magnesium  Oxide -Mg Supplement ([employer] MAGNESIUM ) 500 MG TABS Take 500 mg by mouth daily.     [provider]  nitroGLYCERIN  (NITROSTAT ) 0.4 MG SL tablet DISSOLVE 1 TABLET UNDER THE  TONGUE EVERY 5 MINUTES AS NEEDED FOR CHEST PAIN. MAX OF 3 TABLETS IN 15 MINUTES. CALL 911 IF PAIN  PERSISTS. 11/06/23   Anner Alm ORN, MD  Omega-3 Fatty Acids (FISH OIL BURP-LESS) 1000 MG CAPS Take 2,000 mg by mouth daily.    [provider]  OZEMPIC, 0.25 OR 0.5 MG/DOSE, 2 MG/3ML SOPN Inject 0.5 mg into the skin once a week.    [provider]  pantoprazole  (PROTONIX ) 40 MG tablet Take 40 mg by mouth 2 (two) times daily. 12/09/13   [provider]  ranolazine  (RANEXA ) 500 MG 12 hr tablet Take 1 tablet (500 mg total) by mouth 2 (two) times daily. 05/05/23   West, Katlyn D, NP  Semaglutide, 1 MG/DOSE, (OZEMPIC, 1 MG/DOSE,) 2 MG/1.5ML SOPN Inject 1 mg into the skin every Tuesday.    [provider]  Vitamin D, Cholecalciferol, 25 MCG (1000 UT) CAPS Take 1,000 Units by mouth at bedtime.    [provider]    Allergies: Contrast media [iodinated contrast media], Integrilin [eptifibatide], Codeine, Wound dressing adhesive, Zithromax [azithromycin], and Glipizide    Review of Systems  Cardiovascular:  Positive for chest pain. Negative for leg swelling.  Endocrine: Positive for cold intolerance.  All other systems reviewed and are negative.   Updated Vital Signs BP 134/69   Pulse 64   Temp 98.6 F (37 C) (Oral)   Resp 14   Ht 5' 8 (1.727 m)   Wt 88 kg   SpO2 95%   BMI 29.50 kg/m   Physical Exam Vitals and nursing note reviewed.  Constitutional:      Appearance: Normal appearance.  HENT:     Head: Normocephalic and atraumatic.     Mouth/Throat:     Mouth: Mucous membranes are moist.  Eyes:     General: No scleral icterus.       Right eye: No discharge.        Left eye: No discharge.     Conjunctiva/sclera: Conjunctivae normal.  Cardiovascular:     Rate and Rhythm: Normal rate and regular rhythm.     Pulses: Decreased pulses.           Radial pulses are 1+ on the right side and 1+ on the left side.       Dorsalis pedis pulses are 1+ on the right side and 1+ on the left side.     Comments: Peripheral pulses decreased at carotid and DP. 1+ bilaterally. Patient has known history of poor perfusion but unable to corroborate time frame Pulmonary:     Effort: Pulmonary effort is normal.     Breath sounds: Normal breath sounds.  Abdominal:     General: There is no distension.     Tenderness: There is no abdominal tenderness.  Musculoskeletal:  General: No deformity.     Cervical back: Normal range of motion.     Right lower leg: No edema.     Left lower leg: No edema.  Skin:    General: Skin is warm and dry.     Capillary Refill: Capillary refill takes less than 2 seconds.  Neurological:     Mental Status: He is alert.     Motor: No weakness.  Psychiatric:        Mood and Affect: Mood normal.     (all labs ordered are listed, but only abnormal results are displayed) Labs Reviewed  BASIC METABOLIC PANEL WITH GFR - Abnormal; Notable for the following components:      Result Value   Glucose, Bld 131 (*)    Creatinine, Ser 1.95 (*)    GFR, Estimated 38 (*)    All other components within normal limits  CBC - Abnormal; Notable for the following components:   Platelets 125 (*)    All other components within normal limits  TROPONIN I (HIGH SENSITIVITY)  TROPONIN I (HIGH SENSITIVITY)    EKG: EKG Interpretation Date/Time:  Friday January 16 2024 13:00:29 EDT Ventricular Rate:  65 PR Interval:  154 QRS Duration:  152 QT Interval:  466 QTC Calculation: 484 R Axis:   234  Text Interpretation: Normal sinus rhythm Right bundle branch block Cannot rule out Anteroseptal infarct , age undetermined Abnormal ECG When compared with ECG of 02-Sep-2023 09:52, PREVIOUS ECG IS PRESENT since last tracing no significant change Confirmed by Cleotilde Rogue ([id number]) on 01/16/2024 1:11:22 PM  Radiology: [facility] Chest 2 View Result  Date: 01/16/2024 CLINICAL DATA:  Left-sided chest pain radiating to left arm. EXAM: CHEST - 2 VIEW COMPARISON:  04/25/2023 FINDINGS: The heart size and mediastinal contours are within normal limits. Triple lead transvenous pacemaker remains in expected position. Prior median sternotomy again noted. Both lungs are clear. The visualized skeletal structures are unremarkable. IMPRESSION: No active cardiopulmonary disease. Electronically Signed   By: Norleen DELENA Kil M.D.   On: 01/16/2024 14:12   CUP PACEART REMOTE DEVICE CHECK Result Date: 01/15/2024 Monthly battery check.  Estimated 5 mo Normal device function. LV threshold 2.7 volts at 0.9 ms and amplitude is only programmed to 2.0 volts at 0.9 ms, sent to triage. Follow up as scheduled monthly ML, CVRS    .Critical Care  Performed by: Torrence Marry RAMAN, PA-C Authorized by: Torrence Marry RAMAN, PA-C   Critical care provider statement:    Critical care time (minutes):  31   Critical care was necessary to treat or prevent imminent or life-threatening deterioration of the following conditions:  Cardiac failure   Critical care was time spent personally by me on the following activities:  Development of treatment plan with patient or surrogate, discussions with consultants, evaluation of patient's response to treatment, examination of patient, ordering and review of laboratory studies, ordering and review of radiographic studies, ordering and performing treatments and interventions, pulse oximetry, re-evaluation of patient's condition, review of old charts and blood draw for specimens   I assumed direction of critical care for this patient from another provider in my specialty: no     Care discussed with: admitting provider      Medications Ordered in the ED  nitroGLYCERIN  (NITROSTAT ) SL tablet 0.4 mg (0.4 mg Sublingual Given 01/16/24 1353)  morphine  (PF) 2 MG/ML injection 2 mg (2 mg Intravenous Given 01/16/24 1347)        CHA2DS2-VASc Score: 3     HEART  Score: 4                    Medical Decision Making Amount and/or Complexity of Data Reviewed Labs: ordered. Radiology: ordered.  Risk Prescription drug management. Decision regarding hospitalization.  This patient presents to the ED for concern of chest pain, this involves an extensive number of treatment options, and is a complaint that carries with it a high risk of complications and morbidity.  Differential diagnosis includes: ACS, STEMI, myocardial ischemia, pulmonary embolism, aortic dissection, GERD.  Co morbidities: s/p CABG 2021 - hx of pacemaker placement - s/p PCI   Additional history:  Patient brought in to Cardiology clinic today to evaluate LV lead function due to recent threshold failure testing alert.    In clinic today, reports he has been having chest pain intermittently X 2 weeks with exertion.  Woke up in the middle of the night with 8 out of 10 chest pain, dizziness, and pressure in center of chest. Initially only occurring with exertion, now progressing to at rest as well.     During LV lead testing with pacing patient at a higher heart rate, he became slightly pale, near syncopal with severe chest pain and pressure.  Testing immediately ceased, with apparent capture ongoing at 3.0v@0 .9ms.  Attempted to hard program at 3.0@0 .9 ms until could be evaluated further;however, this triggered again severe angina and lightheadedness.  Device programmed back to prior LV settings.   Lab Tests:  I Ordered, and personally interpreted labs.  The pertinent results include:   Troponin: 8 Creatinine: 1.95 GFR: 38   Imaging Studies:  I ordered imaging studies including chest xr  I independently visualized and interpreted imaging which showed no active cardiopulmonary disease. I agree with the radiologist interpretation  Cardiac Monitoring/ECG:  The patient was maintained on a cardiac monitor.  I personally viewed and interpreted the cardiac monitored which  showed an underlying rhythm of NSR.  Medicines ordered and prescription drug management:  I ordered medication including  Medications  nitroGLYCERIN  (NITROSTAT ) SL tablet 0.4 mg (0.4 mg Sublingual Given 01/16/24 1353)  morphine  (PF) 2 MG/ML injection 2 mg (2 mg Intravenous Given 01/16/24 1347)   for chest pain Reevaluation of the patient after these medicines showed that the patient improved I have reviewed the patients home medicines and have made adjustments as needed  Critical Interventions:  Nitroglycerin  & morphine  for unstable angina  Consultations Obtained: - Cardiology  Problem List / ED Course:     ICD-10-CM   1. Unstable angina (HCC)  I20.0       MDM: Patient is a known cardiology patient who reports to the emergency department for worsening chest pain and exertional dyspnea. His pacemaker settings were being evaluated this morning and he became dizzy, lightheaded, and diaphoretic during the exam as well as developed chest pain. Patient also has unstable angina at rest, which will become critical if left untreated. He also has poor peripheral perfusion, which has been ongoing for the last few weeks. He is a known diabetic who is medication compliant. He has been given additional sublingual nitroglycerin  and morphine  for pain control. His EKG reveals NSR. He has no signs of peripheral edema. Given his extensive cardiac history as detailed above, he is deemed appropriate for further inpatient cardiac workup.    Dispostion:  After consideration of the diagnostic results and the patients response to treatment, I feel that the patient would benefit from admission to the hospital and further workup by cardiology.  Final diagnoses:  Unstable angina [facility])    ED Discharge Orders     None          Torrence Marry GORMAN DEVONNA 01/16/24 1459    Cleotilde Rogue, MD 01/20/24 [phone]

## 2024-01-17 ENCOUNTER — Telehealth: Payer: Self-pay

## 2024-01-17 NOTE — Telephone Encounter (Signed)
 Patient was seen in the ED and per consult note needs close cardiology follow-up. Appointment made with Lum Louis NP for 9/29.  Scheduling could you assist at notifying patient of this appointment.

## 2024-01-19 ENCOUNTER — Other Ambulatory Visit (HOSPITAL_COMMUNITY): Payer: Self-pay

## 2024-01-19 ENCOUNTER — Encounter: Payer: Self-pay | Admitting: Emergency Medicine

## 2024-01-19 ENCOUNTER — Ambulatory Visit: Attending: Emergency Medicine | Admitting: Emergency Medicine

## 2024-01-19 ENCOUNTER — Telehealth: Payer: Self-pay | Admitting: Cardiology

## 2024-01-19 VITALS — BP 124/62 | HR 62 | Ht 68.0 in | Wt 196.0 lb

## 2024-01-19 DIAGNOSIS — I10 Essential (primary) hypertension: Secondary | ICD-10-CM | POA: Diagnosis not present

## 2024-01-19 DIAGNOSIS — E118 Type 2 diabetes mellitus with unspecified complications: Secondary | ICD-10-CM

## 2024-01-19 DIAGNOSIS — I442 Atrioventricular block, complete: Secondary | ICD-10-CM | POA: Diagnosis not present

## 2024-01-19 DIAGNOSIS — I2510 Atherosclerotic heart disease of native coronary artery without angina pectoris: Secondary | ICD-10-CM

## 2024-01-19 DIAGNOSIS — E1169 Type 2 diabetes mellitus with other specified complication: Secondary | ICD-10-CM

## 2024-01-19 DIAGNOSIS — N1832 Chronic kidney disease, stage 3b: Secondary | ICD-10-CM

## 2024-01-19 DIAGNOSIS — Z794 Long term (current) use of insulin: Secondary | ICD-10-CM

## 2024-01-19 DIAGNOSIS — Z951 Presence of aortocoronary bypass graft: Secondary | ICD-10-CM | POA: Diagnosis not present

## 2024-01-19 DIAGNOSIS — E785 Hyperlipidemia, unspecified: Secondary | ICD-10-CM

## 2024-01-19 MED ORDER — ISOSORBIDE MONONITRATE ER 30 MG PO TB24
15.0000 mg | ORAL_TABLET | Freq: Every day | ORAL | 0 refills | Status: DC
  Filled 2024-01-19: qty 45, 90d supply, fill #0

## 2024-01-19 MED ORDER — DIPHENHYDRAMINE HCL 50 MG PO TABS: ORAL_TABLET | ORAL | 0 refills | Status: AC

## 2024-01-19 MED ORDER — PREDNISONE 50 MG PO TABS: ORAL_TABLET | ORAL | 0 refills | Status: DC

## 2024-01-19 NOTE — Telephone Encounter (Signed)
 Patient saw NP today  in regards to this message . Patient has been schedule for cath.

## 2024-01-19 NOTE — Telephone Encounter (Signed)
 He probably needs to be seen by an APP if not may to be able discuss his symptoms.  I suspect he probably needs at least some type of ischemic evaluation and if he truly is having symptoms concerning for angina, may need to consider cardiac catheterization. Based on his complex anatomy, I am not sure how much the stress test will tell us .  Alm Clay, MD

## 2024-01-19 NOTE — H&P (View-Only) (Signed)
 Cardiology Office Note:    Date:  01/19/2024  ID:  John Dodson, DOB 1960/11/13, MRN [id number] PCP: John Murray HERO., MD  [facility] Providers Cardiologist:  John Clay, MD Electrophysiologist:  John Birmingham, MD       Patient Profile:       Chief Complaint: Follow-up ED visit for chest pains History of Present Illness:  John Dodson is a 63 y.o. male with visit-pertinent history of coronary disease s/p multiple prior stents, CABG x 4 in 07/2019 with LIMA to distal LAD, sequential left radial to OM 2 to PDA, RIMA to ramus, complete heart block s/p biventricular PPM on 06/2016, palpitations, hypertension, hyperlipidemia, PE, COVID-19 infection, CKD stage III and type 2 diabetes  Cardiac catheterization prior to bypass surgery in 07/2019 showed EF 55 to 65%, 60% ostial to proximal left circumflex lesion, 60% mid to distal LAD lesion, widely patent OM2 stent, 80% lateral second marginal lesion, widely patent stent in the first LPL, 50% RPA V lesion. Echocardiogram in 2021 showed EF 55 to 60%, normal LV function. Lexiscan  in 09/2021 for chest pain was low risk, EF 62%. At that time it was felt that his chest discomfort was related to incision/scar pain. In January 2024 he continued to note intermittent chest discomfort, predominantly at rest though he did report some exertional symptoms. He was started on Ranexa  500 mg twice daily with improvement.  On 04/28/2023 he underwent cardiac catheterization, there was noted progression of native CAD with a now occluded RCA stent as well as mid LAD at LIMA insertion site. There is widely patent pedicled LIMA to LAD and pedicle RIMA to LDPA with flush occlusion of sequential left radial, not felt to be acute graft failure. His Ranexa  was increased to 1000 mg twice daily with recommendation to reassess for symptom improvement over the next 2 to 4 weeks, if unable to control symptoms recommended return for staged PCI of the left main to  ostial LCx.   He was seen on 05/05/2023.  He reported to continue to have daily chest discomfort worsened by exertion.  He was unable to tolerate increased dose of Ranexa  due to dizziness as well as lightheadedness.  His Ranexa  was decreased back to 500 mg twice daily.  He was then started on amlodipine  5 mg daily.  His losartan  was held due to hyperkalemia.  He was seen for follow-up on 05/13/2023.  He continued to have daily chest discomfort.  He was able to rest with improvement.  He was regularly taking sublingual nitroglycerin .  Noted dizziness and lightheadedness that improved with decreased Ranexa .  On 05/21/2023 patient underwent cardiac catheterization and angioplasty was performed in the main branch and sidebranch, a DES was successfully placed from the ostial left main through the proximal LCx with a Onyx frontier 4.0 x 30 stent.  It was recommended that patient have uninterrupted dual antiplatelet therapy with aspirin  and clopidogrel  for a minimum of 6 months then continue Plavix  monotherapy for lifelong maintenance therapy.  While in Short stay patient developed a hematoma immediately above the TR band.  BP cuff protocol was used.  Site was reevaluated prior to discharge and found to be stable without any complications.  Dr. Lonni Dodson from vascular surgery and Dr. Clay both reviewed the site, there is no evidence of compartment syndrome.   Echocardiogram 07/01/2023 showed LVEF 60 to 65%, no RWMA, mild LVH of septal segment, grade 1 DD, RV function normal, normal PASP, trivial MR.  He was last seen  by general cardiology on 09/02/2023 by John Dodson.  He had no further angina following PCI to the LM/LCx.  He was continued on combination of carvedilol  12.5 mg twice daily, amlodipine  2.5 mg daily, Ranexa  500 mg twice daily, losartan  25 mg daily.  He was on combination of Lipitor  plus Repatha  along with Ozempic and Jardiance .  He was recently seen in the ED on 01/16/2024 for chest pain.   Cardiology was consulted.  Apparently he was in the office to evaluate his LV lead dysfunction.  Apparently when the test started he developed chest tightness and had a near syncopal episode.  He had noted for the past few weeks he had exertional chest pressure.  Symptoms have resolved with nitroglycerin  x 2 and morphine .  Troponins were negative and EKG was unchanged.  His losartan  was stopped.  His amlodipine  was increased to 5 mg daily and he was started on Imdur  15 mg daily.  He was continued on carvedilol  12.5 mg daily and Ranexa  1000 mg twice daily.   Discussed the use of AI scribe software for clinical note transcription with the patient, who gave verbal consent to proceed.  History of Present Illness John Dodson is a 63 year old male with coronary artery disease who presents with persistent chest tightness.  He experiences constant chest tightness for the past two weeks.  He reports the constant chest tightness has not improved since ED visit however has not worsened.  The tightness worsens with exertion, such as walking or household activities.  He denies shortness of breath, orthopnea, PND, dizziness, or syncope but experiences occasional lightheadedness.  He has yet to start isosorbide  due to pharmacy issues.  He did discontinue his losartan  and increase his amlodipine .  In the past increasing ranolazine  to 100 mg twice daily previously caused lightheadedness and dizziness.  He takes Lasix  as needed for fluid retention.  He adheres to his current medication regimen.  He is on disability and performs light activities at home, stopping when chest pain occurs.  Review of systems:  Please see the history of present illness. All other systems are reviewed and otherwise negative.      Studies Reviewed:        Echocardiogram 07/01/2023  1. Left ventricular ejection fraction, by estimation, is 60 to 65%. The  left ventricle has normal function. The left ventricle has no regional   wall motion abnormalities. There is mild asymmetric left ventricular  hypertrophy of the septal segment. Left  ventricular diastolic parameters are consistent with Grade I diastolic  dysfunction (impaired relaxation).   2. Right ventricular systolic function is normal. The right ventricular  size is not well visualized. There is normal pulmonary artery systolic  pressure. The estimated right ventricular systolic pressure is 23.6 mmHg.   3. The mitral valve is grossly normal. Trivial mitral valve  regurgitation. No evidence of mitral stenosis.   4. The aortic valve is tricuspid. Aortic valve regurgitation is not  visualized. No aortic stenosis is present.   5. The inferior vena cava is normal in size with greater than 50%  respiratory variability, suggesting right atrial pressure of 3 mmHg.   6. Rhythm strip during this exam demonstrates paced versus bundle branch  block.   Cardiac catheterization 05/21/2023   Mid LM to Ost LAD lesion is 50% stenosed with 80% stenosed side branch in Ost Cx to Prox Cx.  Prox Cx to Mid Cx lesion is 45% stenosed.  TIMI-3 flow   ScoreFlex angioplasty was performed (using  sequential balloons from 3.0 mm standard balloon to 3.5 and 4.0 mm Blossom scoring balloons) in the main branch and side branch.   A drug-eluting stent was successfully placed from Ostial Left Main through the proximal LCx 45% lesion using a STENT ONYX FRONTIER 4.0X30.  Postdilated to 4.6 mm.   Post intervention, there is a 0% residual stenosis throughout the stented segment in the LM and LCx.  TIMI-3 flow maintained   Ost LAD to Prox LAD lesion is 60% stenosed -stable post PCI; Mid LAD to Dist LAD lesion is 100% stenosed.   ------------------------------   Lat 2nd Mrg lesion is 80% stenosed.   LPAV lesion is 50% stenosed.  LPDA lesion is 5% stenosed.   1st LPL & 2nd Mrg stents are widely patent   ------------------------------   LV end diastolic pressure is normal.   There is no aortic valve  stenosis.   Successful Left Main-Ostial to Proximal LCx DES PCI with score flex PTCA pretreatment using a Onyx Frontier DES 4.0 mm x 30 mm postdilated to 4.6 mm. 80% lesion reduced to 0% with TIMI-3 flow. Sidebranch flow in the LAD maintained.  LVEDP 16 mmHg.  Okay for post cath hydration   RECOMMENDATION   In the absence of any other complications or medical issues, we expect the patient to be ready for discharge from an interventional cardiology perspective on 05/21/2023.         Will allow for 8 hours post cath hydration given his creatinine of 1.7.   Recommend uninterrupted dual antiplatelet therapy with Aspirin  81mg  daily and Clopidogrel  75mg  daily for a minimum of 6 months (stable ischemic heart disease-Class I recommendation).   With then continue Plavix  monotherapy (SAPT) for lifelong maintenance therapy. Diagnostic Dominance: Left  Intervention   Cardiac catheterization 04/28/2023   Ost LAD to Mid LAD lesion is 10% stenosed. Mid LAD to Dist LAD lesion is 100% stenosed.   Ost Cx to Prox Cx lesion is 65% stenosed.->  Previously evaluated prior to CABG as FFR positive   LPAV lesion is 50% stenosed.   Lat 2nd Mrg lesion is 80% stenosed.   Previously placed LPDA stent is 5% stenosed.   Ost RCA to Mid RCA STENT is 100% stenosed.   Previously placed 2nd Mrg stent of unknown type is  widely patent.   Previously placed 1st LPL stent of unknown type is  widely patent.   GRAFTS.   RIMA-PDA graft was visualized by angiography and is moderate in size. The graft exhibits no disease.   LIMA-LAD graft was visualized by angiography and is normal in caliber. The graft exhibits no disease.   Seq LRAD-OM1*LPL1(OM2) graft was injected, but not not visualized due to occlusion. Origin to Prox Graft lesion before Lat 2nd Mrg is 100% stenosed. ->  Does not appear to to be freshly occluded especially in light of negative troponins.   LV end diastolic pressure is mildly elevated.   FINDINGS  SUMMARY Progression of native CAD: Now occluded RCA stent as well as mid LAD at LIMA insertion site.  Widely patent pedicled LIMA-LAD and pedicle RIMA-LPDA with flush occlusion of sequential Left Radial - does not appear to be acute graft failure. Mildly Elevated LVEDP ~18 mmHg     RECOMMENDATIONS Post cath hydration an hour for 8 hours.  Closely monitor renal function Titrate medical management with increasing Ranexa  to 1000 mg twice daily -> reassess for symptom improvement over the next 2 to 4 weeks to determine if symptoms have improved, if  not able to control symptoms, would return for staged PCI of the Left Main-ostial LCx. Diagnostic Dominance: Left  Lexiscan  Myoview  10/03/2021   The study is normal. The study is low risk.   No ST deviation was noted.   LV perfusion is normal.   Left ventricular function is normal. Nuclear stress EF: 62 %. The left ventricular ejection fraction is normal (55-65%). End diastolic cavity size is normal.   Prior study available for comparison from 08/20/2018.   Low risk stress nuclear study with normal perfusion and normal left ventricular regional and global systolic function.  ZIO 06/13/2021 Underlying predominant rhythm is Sinus Rhythm with an average rate of 84 bpm and a rate range of 60 to 118 bpm Very rare PACs and PVCs noted. There are also noted appropriate pacing as well as occasional episodes where there is pacing with intrinsic beats also noted.     Patch Wear Time:  14 days and 0 hours (2023-01-23T07:23:41-0500 to 2023-02-06T07:23:45-0500)     Overall, pretty normal monitor.  Will need EP to assess pacemaker to see if there is any true arrhythmias.  Cardiac catheterization 08/20/2019 The left ventricular systolic function is normal. The left ventricular ejection fraction is 55-65% by visual estimate. LV end diastolic pressure is normal.LV end diastolic pressure is normal. ------------------- There is stent from ostial to almost distal  small nondominant RCA: Ost RCA to Mid RCA stent is diffusely 35% stenosed. Ost Cx to Prox Cx lesion is 60% stenosed. Ost LAD to Mid LAD stent is 10% stenosed. Mid LAD to Dist LAD lesion is 60% stenosed. Previously placed 2nd Mrg stent (DES) is widely patent. Lat 2nd Mrg lesion is 80% stenosed. Previously described. Previously placed 1st LPL stent (DES) is widely patent. LPAV lesion is 50% stenosed. LPDA stent is 5% stenosed.   Multivessel CAD with essentially patent stents in proximal to mid LAD, proximal OM1, proximal LPL1, several stents in the L PDA as well as RCA that is a small nondominant vessel.  No obvious lesions within stents noted. Ostial LCx roughly 60% --> highly DFR positive with 0.84 Long mid LAD 60% after stent--highly DFR +0.67-0.73 Normal LVEDP   With significant LAD as well as ostial LCx disease--best course of action is CVTS consultation for CABG. Diagnostic Dominance: Left  Risk Assessment/Calculations:              Physical Exam:   VS:  BP 124/62 (BP Location: Left Arm, Patient Position: Sitting, Cuff Size: Large)   Pulse 62   Ht 5' 8 (1.727 m)   Wt 196 lb (88.9 kg)   SpO2 98%   BMI 29.80 kg/m    Wt Readings from Last 3 Encounters:  01/19/24 196 lb (88.9 kg)  01/16/24 194 lb (88 kg)  11/07/23 198 lb 12.8 oz (90.2 kg)    GEN: Well nourished, well developed in no acute distress NECK: No JVD; No carotid bruits CARDIAC: RRR, no murmurs, rubs, gallops RESPIRATORY:  Clear to auscultation without rales, wheezing or rhonchi  ABDOMEN: Soft, non-tender, non-distended EXTREMITIES:  No edema; No acute deformity      Assessment and Plan:  Coronary artery disease Multiple PCI's with four-vessel CABG 07/2019 with LIMA to LAD, left radial to OM 2/PDA, RIMA-LDPA.  He recently underwent PCI from the ostial left main into left circumflex in January 2025.  Recommended DAPT for 6 months and then Plavix  monotherapy lifelong. Seen by cardiology during ED visit 3 days ago  in which he was subsequently started on Imdur   15 mg daily as losartan  was discontinued - He has yet to receive the Imdur  from his pharmacy  - Today he continues to experience constant chest pains described as tightness or heaviness over the past 2 weeks.  He reports chest pains do worsen on exertion and improved with rest and nitroglycerin .  He will occasionally have radiation down his left arm as well as jaw - Given symptoms of unstable angina I discussed case with Dr. [place] who did recommend proceeding with cardiac catheterization.  Will schedule with John Dodson - BMET and CBC today - He has IV contrast allergy, he will take prednisone  and benadryl  prior to procedure. He will hold Jardiance , Lantus  and Ozempic as directed. Given history of CKD he will require IV hydration prior to catheterization - Cardiac catheterization is scheduled for 02/03/2024 with John Dodson  - Continue amlodipine  5 mg daily, carvedilol  12.5 mg twice daily, isosorbide  15 mg daily, ranolazine  500 mg twice daily, and nitroglycerin  as needed - Continue aspirin  81 mg daily and clopidogrel  75 mg daily - Continue atorvastatin  80 mg daily, Repatha  140 mg q. 14 days, Jardiance  25 mg daily, and Ozempic 0.5 mg q. 7 days  CHB S/p BiV PPM in 2018 - Management per EP  Hypertension Blood pressure today is well-controlled at 124/62 - Continue amlodipine  5 mg daily, carvedilol  12.5 mg twice daily, isosorbide  15 mg daily  Hyperlipidemia, LDL goal <55 LDL 59 on 10/2023 and previously 9 on 07/2023 - LDL slightly above goal.  He does report adherence to regimen - Will repeat LFTs/fasting lipid panel at follow-up visit - Continue atorvastatin  80 mg daily and Repatha  140 mg q. 14 days  T2DM A1c 6.6% on 10/2023 - Managed on Ozempic, Lantus , and Jardiance   CKD stage IIIb Creatinine 1.95 and GFR 38 on 12/2023 - Stable.  Avoid nephrotoxic drugs  - Repeat BMET today - Followed by nephrology    Informed Consent   Shared Decision  Making/Informed Consent The risks [stroke (1 in 1000), death (1 in 1000), kidney failure [usually temporary] (1 in 500), bleeding (1 in 200), allergic reaction [possibly serious] (1 in 200)], benefits (diagnostic support and management of coronary artery disease) and alternatives of a cardiac catheterization were discussed in detail with Mr. Tanzi and he is willing to proceed.     Dispo:  No follow-ups on file.  Signed, Lum LITTIE Louis, NP

## 2024-01-19 NOTE — Progress Notes (Signed)
 Cardiology Office Note:    Date:  01/19/2024  ID:  John Dodson, DOB 1960/11/13, MRN [id number] PCP: Delilah Murray HERO., MD  [facility] Providers Cardiologist:  Alm Clay, MD Electrophysiologist:  Danelle Birmingham, MD       Patient Profile:       Chief Complaint: Follow-up ED visit for chest pains History of Present Illness:  John Dodson is a 63 y.o. male with visit-pertinent history of coronary disease s/p multiple prior stents, CABG x 4 in 07/2019 with LIMA to distal LAD, sequential left radial to OM 2 to PDA, RIMA to ramus, complete heart block s/p biventricular PPM on 06/2016, palpitations, hypertension, hyperlipidemia, PE, COVID-19 infection, CKD stage III and type 2 diabetes  Cardiac catheterization prior to bypass surgery in 07/2019 showed EF 55 to 65%, 60% ostial to proximal left circumflex lesion, 60% mid to distal LAD lesion, widely patent OM2 stent, 80% lateral second marginal lesion, widely patent stent in the first LPL, 50% RPA V lesion. Echocardiogram in 2021 showed EF 55 to 60%, normal LV function. Lexiscan  in 09/2021 for chest pain was low risk, EF 62%. At that time it was felt that his chest discomfort was related to incision/scar pain. In January 2024 he continued to note intermittent chest discomfort, predominantly at rest though he did report some exertional symptoms. He was started on Ranexa  500 mg twice daily with improvement.  On 04/28/2023 he underwent cardiac catheterization, there was noted progression of native CAD with a now occluded RCA stent as well as mid LAD at LIMA insertion site. There is widely patent pedicled LIMA to LAD and pedicle RIMA to LDPA with flush occlusion of sequential left radial, not felt to be acute graft failure. His Ranexa  was increased to 1000 mg twice daily with recommendation to reassess for symptom improvement over the next 2 to 4 weeks, if unable to control symptoms recommended return for staged PCI of the left main to  ostial LCx.   He was seen on 05/05/2023.  He reported to continue to have daily chest discomfort worsened by exertion.  He was unable to tolerate increased dose of Ranexa  due to dizziness as well as lightheadedness.  His Ranexa  was decreased back to 500 mg twice daily.  He was then started on amlodipine  5 mg daily.  His losartan  was held due to hyperkalemia.  He was seen for follow-up on 05/13/2023.  He continued to have daily chest discomfort.  He was able to rest with improvement.  He was regularly taking sublingual nitroglycerin .  Noted dizziness and lightheadedness that improved with decreased Ranexa .  On 05/21/2023 patient underwent cardiac catheterization and angioplasty was performed in the main branch and sidebranch, a DES was successfully placed from the ostial left main through the proximal LCx with a Onyx frontier 4.0 x 30 stent.  It was recommended that patient have uninterrupted dual antiplatelet therapy with aspirin  and clopidogrel  for a minimum of 6 months then continue Plavix  monotherapy for lifelong maintenance therapy.  While in Short stay patient developed a hematoma immediately above the TR band.  BP cuff protocol was used.  Site was reevaluated prior to discharge and found to be stable without any complications.  Dr. Lonni Gaskins from vascular surgery and Dr. Clay both reviewed the site, there is no evidence of compartment syndrome.   Echocardiogram 07/01/2023 showed LVEF 60 to 65%, no RWMA, mild LVH of septal segment, grade 1 DD, RV function normal, normal PASP, trivial MR.  He was last seen  by general cardiology on 09/02/2023 by Dr. Anner.  He had no further angina following PCI to the LM/LCx.  He was continued on combination of carvedilol  12.5 mg twice daily, amlodipine  2.5 mg daily, Ranexa  500 mg twice daily, losartan  25 mg daily.  He was on combination of Lipitor  plus Repatha  along with Ozempic and Jardiance .  He was recently seen in the ED on 01/16/2024 for chest pain.   Cardiology was consulted.  Apparently he was in the office to evaluate his LV lead dysfunction.  Apparently when the test started he developed chest tightness and had a near syncopal episode.  He had noted for the past few weeks he had exertional chest pressure.  Symptoms have resolved with nitroglycerin  x 2 and morphine .  Troponins were negative and EKG was unchanged.  His losartan  was stopped.  His amlodipine  was increased to 5 mg daily and he was started on Imdur  15 mg daily.  He was continued on carvedilol  12.5 mg daily and Ranexa  1000 mg twice daily.   Discussed the use of AI scribe software for clinical note transcription with the patient, who gave verbal consent to proceed.  History of Present Illness John Dodson is a 63 year old male with coronary artery disease who presents with persistent chest tightness.  He experiences constant chest tightness for the past two weeks.  He reports the constant chest tightness has not improved since ED visit however has not worsened.  The tightness worsens with exertion, such as walking or household activities.  He denies shortness of breath, orthopnea, PND, dizziness, or syncope but experiences occasional lightheadedness.  He has yet to start isosorbide  due to pharmacy issues.  He did discontinue his losartan  and increase his amlodipine .  In the past increasing ranolazine  to 100 mg twice daily previously caused lightheadedness and dizziness.  He takes Lasix  as needed for fluid retention.  He adheres to his current medication regimen.  He is on disability and performs light activities at home, stopping when chest pain occurs.  Review of systems:  Please see the history of present illness. All other systems are reviewed and otherwise negative.      Studies Reviewed:        Echocardiogram 07/01/2023  1. Left ventricular ejection fraction, by estimation, is 60 to 65%. The  left ventricle has normal function. The left ventricle has no regional   wall motion abnormalities. There is mild asymmetric left ventricular  hypertrophy of the septal segment. Left  ventricular diastolic parameters are consistent with Grade I diastolic  dysfunction (impaired relaxation).   2. Right ventricular systolic function is normal. The right ventricular  size is not well visualized. There is normal pulmonary artery systolic  pressure. The estimated right ventricular systolic pressure is 23.6 mmHg.   3. The mitral valve is grossly normal. Trivial mitral valve  regurgitation. No evidence of mitral stenosis.   4. The aortic valve is tricuspid. Aortic valve regurgitation is not  visualized. No aortic stenosis is present.   5. The inferior vena cava is normal in size with greater than 50%  respiratory variability, suggesting right atrial pressure of 3 mmHg.   6. Rhythm strip during this exam demonstrates paced versus bundle branch  block.   Cardiac catheterization 05/21/2023   Mid LM to Ost LAD lesion is 50% stenosed with 80% stenosed side branch in Ost Cx to Prox Cx.  Prox Cx to Mid Cx lesion is 45% stenosed.  TIMI-3 flow   ScoreFlex angioplasty was performed (using  sequential balloons from 3.0 mm standard balloon to 3.5 and 4.0 mm Blossom scoring balloons) in the main branch and side branch.   A drug-eluting stent was successfully placed from Ostial Left Main through the proximal LCx 45% lesion using a STENT ONYX FRONTIER 4.0X30.  Postdilated to 4.6 mm.   Post intervention, there is a 0% residual stenosis throughout the stented segment in the LM and LCx.  TIMI-3 flow maintained   Ost LAD to Prox LAD lesion is 60% stenosed -stable post PCI; Mid LAD to Dist LAD lesion is 100% stenosed.   ------------------------------   Lat 2nd Mrg lesion is 80% stenosed.   LPAV lesion is 50% stenosed.  LPDA lesion is 5% stenosed.   1st LPL & 2nd Mrg stents are widely patent   ------------------------------   LV end diastolic pressure is normal.   There is no aortic valve  stenosis.   Successful Left Main-Ostial to Proximal LCx DES PCI with score flex PTCA pretreatment using a Onyx Frontier DES 4.0 mm x 30 mm postdilated to 4.6 mm. 80% lesion reduced to 0% with TIMI-3 flow. Sidebranch flow in the LAD maintained.  LVEDP 16 mmHg.  Okay for post cath hydration   RECOMMENDATION   In the absence of any other complications or medical issues, we expect the patient to be ready for discharge from an interventional cardiology perspective on 05/21/2023.         Will allow for 8 hours post cath hydration given his creatinine of 1.7.   Recommend uninterrupted dual antiplatelet therapy with Aspirin  81mg  daily and Clopidogrel  75mg  daily for a minimum of 6 months (stable ischemic heart disease-Class I recommendation).   With then continue Plavix  monotherapy (SAPT) for lifelong maintenance therapy. Diagnostic Dominance: Left  Intervention   Cardiac catheterization 04/28/2023   Ost LAD to Mid LAD lesion is 10% stenosed. Mid LAD to Dist LAD lesion is 100% stenosed.   Ost Cx to Prox Cx lesion is 65% stenosed.->  Previously evaluated prior to CABG as FFR positive   LPAV lesion is 50% stenosed.   Lat 2nd Mrg lesion is 80% stenosed.   Previously placed LPDA stent is 5% stenosed.   Ost RCA to Mid RCA STENT is 100% stenosed.   Previously placed 2nd Mrg stent of unknown type is  widely patent.   Previously placed 1st LPL stent of unknown type is  widely patent.   GRAFTS.   RIMA-PDA graft was visualized by angiography and is moderate in size. The graft exhibits no disease.   LIMA-LAD graft was visualized by angiography and is normal in caliber. The graft exhibits no disease.   Seq LRAD-OM1*LPL1(OM2) graft was injected, but not not visualized due to occlusion. Origin to Prox Graft lesion before Lat 2nd Mrg is 100% stenosed. ->  Does not appear to to be freshly occluded especially in light of negative troponins.   LV end diastolic pressure is mildly elevated.   FINDINGS  SUMMARY Progression of native CAD: Now occluded RCA stent as well as mid LAD at LIMA insertion site.  Widely patent pedicled LIMA-LAD and pedicle RIMA-LPDA with flush occlusion of sequential Left Radial - does not appear to be acute graft failure. Mildly Elevated LVEDP ~18 mmHg     RECOMMENDATIONS Post cath hydration an hour for 8 hours.  Closely monitor renal function Titrate medical management with increasing Ranexa  to 1000 mg twice daily -> reassess for symptom improvement over the next 2 to 4 weeks to determine if symptoms have improved, if  not able to control symptoms, would return for staged PCI of the Left Main-ostial LCx. Diagnostic Dominance: Left  Lexiscan  Myoview  10/03/2021   The study is normal. The study is low risk.   No ST deviation was noted.   LV perfusion is normal.   Left ventricular function is normal. Nuclear stress EF: 62 %. The left ventricular ejection fraction is normal (55-65%). End diastolic cavity size is normal.   Prior study available for comparison from 08/20/2018.   Low risk stress nuclear study with normal perfusion and normal left ventricular regional and global systolic function.  ZIO 06/13/2021 Underlying predominant rhythm is Sinus Rhythm with an average rate of 84 bpm and a rate range of 60 to 118 bpm Very rare PACs and PVCs noted. There are also noted appropriate pacing as well as occasional episodes where there is pacing with intrinsic beats also noted.     Patch Wear Time:  14 days and 0 hours (2023-01-23T07:23:41-0500 to 2023-02-06T07:23:45-0500)     Overall, pretty normal monitor.  Will need EP to assess pacemaker to see if there is any true arrhythmias.  Cardiac catheterization 08/20/2019 The left ventricular systolic function is normal. The left ventricular ejection fraction is 55-65% by visual estimate. LV end diastolic pressure is normal.LV end diastolic pressure is normal. ------------------- There is stent from ostial to almost distal  small nondominant RCA: Ost RCA to Mid RCA stent is diffusely 35% stenosed. Ost Cx to Prox Cx lesion is 60% stenosed. Ost LAD to Mid LAD stent is 10% stenosed. Mid LAD to Dist LAD lesion is 60% stenosed. Previously placed 2nd Mrg stent (DES) is widely patent. Lat 2nd Mrg lesion is 80% stenosed. Previously described. Previously placed 1st LPL stent (DES) is widely patent. LPAV lesion is 50% stenosed. LPDA stent is 5% stenosed.   Multivessel CAD with essentially patent stents in proximal to mid LAD, proximal OM1, proximal LPL1, several stents in the L PDA as well as RCA that is a small nondominant vessel.  No obvious lesions within stents noted. Ostial LCx roughly 60% --> highly DFR positive with 0.84 Long mid LAD 60% after stent--highly DFR +0.67-0.73 Normal LVEDP   With significant LAD as well as ostial LCx disease--best course of action is CVTS consultation for CABG. Diagnostic Dominance: Left  Risk Assessment/Calculations:              Physical Exam:   VS:  BP 124/62 (BP Location: Left Arm, Patient Position: Sitting, Cuff Size: Large)   Pulse 62   Ht 5' 8 (1.727 m)   Wt 196 lb (88.9 kg)   SpO2 98%   BMI 29.80 kg/m    Wt Readings from Last 3 Encounters:  01/19/24 196 lb (88.9 kg)  01/16/24 194 lb (88 kg)  11/07/23 198 lb 12.8 oz (90.2 kg)    GEN: Well nourished, well developed in no acute distress NECK: No JVD; No carotid bruits CARDIAC: RRR, no murmurs, rubs, gallops RESPIRATORY:  Clear to auscultation without rales, wheezing or rhonchi  ABDOMEN: Soft, non-tender, non-distended EXTREMITIES:  No edema; No acute deformity      Assessment and Plan:  Coronary artery disease Multiple PCI's with four-vessel CABG 07/2019 with LIMA to LAD, left radial to OM 2/PDA, RIMA-LDPA.  He recently underwent PCI from the ostial left main into left circumflex in January 2025.  Recommended DAPT for 6 months and then Plavix  monotherapy lifelong. Seen by cardiology during ED visit 3 days ago  in which he was subsequently started on Imdur   15 mg daily as losartan  was discontinued - He has yet to receive the Imdur  from his pharmacy  - Today he continues to experience constant chest pains described as tightness or heaviness over the past 2 weeks.  He reports chest pains do worsen on exertion and improved with rest and nitroglycerin .  He will occasionally have radiation down his left arm as well as jaw - Given symptoms of unstable angina I discussed case with Dr. [place] who did recommend proceeding with cardiac catheterization.  Will schedule with Dr. Anner - BMET and CBC today - He has IV contrast allergy, he will take prednisone  and benadryl  prior to procedure. He will hold Jardiance , Lantus  and Ozempic as directed. Given history of CKD he will require IV hydration prior to catheterization - Cardiac catheterization is scheduled for 02/03/2024 with Dr. Anner  - Continue amlodipine  5 mg daily, carvedilol  12.5 mg twice daily, isosorbide  15 mg daily, ranolazine  500 mg twice daily, and nitroglycerin  as needed - Continue aspirin  81 mg daily and clopidogrel  75 mg daily - Continue atorvastatin  80 mg daily, Repatha  140 mg q. 14 days, Jardiance  25 mg daily, and Ozempic 0.5 mg q. 7 days  CHB S/p BiV PPM in 2018 - Management per EP  Hypertension Blood pressure today is well-controlled at 124/62 - Continue amlodipine  5 mg daily, carvedilol  12.5 mg twice daily, isosorbide  15 mg daily  Hyperlipidemia, LDL goal <55 LDL 59 on 10/2023 and previously 9 on 07/2023 - LDL slightly above goal.  He does report adherence to regimen - Will repeat LFTs/fasting lipid panel at follow-up visit - Continue atorvastatin  80 mg daily and Repatha  140 mg q. 14 days  T2DM A1c 6.6% on 10/2023 - Managed on Ozempic, Lantus , and Jardiance   CKD stage IIIb Creatinine 1.95 and GFR 38 on 12/2023 - Stable.  Avoid nephrotoxic drugs  - Repeat BMET today - Followed by nephrology    Informed Consent   Shared Decision  Making/Informed Consent The risks [stroke (1 in 1000), death (1 in 1000), kidney failure [usually temporary] (1 in 500), bleeding (1 in 200), allergic reaction [possibly serious] (1 in 200)], benefits (diagnostic support and management of coronary artery disease) and alternatives of a cardiac catheterization were discussed in detail with Mr. Tanzi and he is willing to proceed.     Dispo:  No follow-ups on file.  Signed, Lum LITTIE Louis, NP

## 2024-01-19 NOTE — Telephone Encounter (Signed)
 Spoke with pt regarding chest tightness/heaviness. Pt stated he was in the ED on Friday of last week for chest pain. Pt was given medications that relieved his chest pain some but not completely. Pt was sent home and still felt a heaviness and tightness and left arm pain when he left the hospital. Pt stated he took a nitroglycerin  last night and it helped some but not much. Pt denies any nausea, vomiting or shortness of breath. Pt was given ED precautions and his ED follow up appointment with Lum Louis, NP was moved from 9/29 to today 9/22. Pt verbalized understanding. All questions if any were answered.

## 2024-01-19 NOTE — Patient Instructions (Addendum)
 Medication Instructions:  STOP TAKING LOSARTAN . START TAKING IMDUR  15 MG (1/2 TABLET) DAILY. CONTINUE ALL OTHER MEDICATION THERAPY.   Lab Work: BMET TO BE DONE TODAY.   Testing/Procedures:  [facility] A DEPT OF [facility] [address] A DEPT OF THE [facility] [address] [place] [address] Dept: [phone] Loc: [phone]  John Dodson  01/19/2024  You are scheduled for a Cardiac Catheterization on Tuesday, October 7 with Dr. Alm Clay.  1. Please arrive at the [facility] (Main Entrance A) at [facility]: [address] [place], [place] [address] at 8:00 AM (This time is 3 hour(s) before your procedure to ensure your preparation).   Free valet parking service is available. You will check in at ADMITTING. The support person will be asked to wait in the waiting room.  It is OK to have someone drop you off and come back when you are ready to be discharged.    Special note: Every effort is made to have your procedure done on time. Please understand that emergencies sometimes delay scheduled procedures.  2. Diet: Nothing to eat after midnight.   3. Hydration: Nothing to eat and drink after midnight.   4. Labs: You will need to have blood drawn on Monday, September 22 at [facility] (1st Floor), [address] [place], [place] [address]. You do not need to be fasting.  5. Medication instructions in preparation for your procedure:   Contrast Allergy: Yes, Please take Prednisone  50mg  by mouth at: Thirteen hours prior to cath 10:00pm on Monday Seven hours prior to cath 4:00am on Tuesday And prior to leaving home please take last dose of Prednisone  50mg  and Benadryl  50mg  by mouth  Take only 22 units of insulin  the night before your procedure. Do not take any insulin  on the day of the procedure.  Do not take Diabetes Med JARDIANCE  on the day of the  procedure and HOLD 48 HOURS AFTER THE PROCEDURE.  HOLD OZEMPIC 1 WEEK PRIOR TO CATH. LAST DOSE ON 01-27-24.  On the morning of your procedure, take your Aspirin  81 mg and any morning medicines NOT listed above.  You may use sips of water .  6. Plan to go home the same day, you will only stay overnight if medically necessary. 7. Bring a current list of your medications and current insurance cards. 8. You MUST have a responsible person to drive you home. 9. Someone MUST be with you the first 24 hours after you arrive home or your discharge will be delayed. 10. Please wear clothes that are easy to get on and off and wear slip-on shoes.  Thank you for allowing us  to care for you!   -- [facility]   Follow-Up: At [facility], you and your health needs are our priority.  As part of our continuing mission to provide you with exceptional heart care, our providers are all part of one team.  This team includes your primary Cardiologist (physician) and Advanced Practice Providers or APPs (Physician Assistants and Nurse Practitioners) who all work together to provide you with the care you need, when you need it.  Your next appointment:   02/02/24 AT 8:50 AM WITH John FOUNTAIN, NP  Provider:   Lum Louis, NP

## 2024-01-19 NOTE — Telephone Encounter (Signed)
   Pt c/o of Chest Pain: STAT if active CP, including tightness, pressure, jaw pain, radiating pain to shoulder/upper arm/back, CP unrelieved by Nitro. Symptoms reported of SOB, nausea, vomiting, sweating.  1. Are you having CP right now? No pain, just tightness     2. Are you experiencing any other symptoms (ex. SOB, nausea, vomiting, sweating)? Tightness and heaviness in chest    3. Is your CP continuous or coming and going? Continuous    4. Have you taken Nitroglycerin ? No   5. How long have you been experiencing CP? The past two weeks     6. If NO CP at time of call then end call with telling Pt to call back or call 911 if Chest pain returns prior to return call from triage team.  Pt had recent ED visit for chest pain. Pain has subsided but he is still experiencing tightness and heaviness. Please advise.

## 2024-01-20 ENCOUNTER — Encounter: Payer: Self-pay | Admitting: Emergency Medicine

## 2024-01-20 NOTE — Telephone Encounter (Signed)
 Error

## 2024-01-20 NOTE — Progress Notes (Signed)
 Per chart review patient had a follow up with cardiology on 01/19/24. Changes made were : scheduled for heart cath. Per CHESS protocol, outreach is not warranted at this time as the patient has already had a follow up appt within 7 days from this encounter.   Suzen Sharps, RN, BSN [employer]  [phone]

## 2024-01-21 LAB — BASIC METABOLIC PANEL WITH GFR
BUN/Creatinine Ratio: 16 (ref 10–24)
BUN: 30 mg/dL — ABNORMAL HIGH (ref 8–27)
CO2: 25 mmol/L (ref 20–29)
Calcium: 9.6 mg/dL (ref 8.6–10.2)
Chloride: 99 mmol/L (ref 96–106)
Creatinine, Ser: 1.89 mg/dL — ABNORMAL HIGH (ref 0.76–1.27)
Glucose: 147 mg/dL — ABNORMAL HIGH (ref 70–99)
Potassium: 5 mmol/L (ref 3.5–5.2)
Sodium: 140 mmol/L (ref 134–144)
eGFR: 40 mL/min/1.73 — ABNORMAL LOW (ref >59)

## 2024-01-21 LAB — CBC
Hematocrit: 49.4 % (ref 37.5–51.0)
Hemoglobin: 16.4 g/dL (ref 13.0–17.7)
MCH: 30.3 pg (ref 26.6–33.0)
MCHC: 33.2 g/dL (ref 31.5–35.7)
MCV: 91 fL (ref 79–97)
Platelets: 120 x10E3/uL — ABNORMAL LOW (ref 150–450)
RBC: 5.42 x10E6/uL (ref 4.14–5.80)
RDW: 13.5 % (ref 11.6–15.4)
WBC: 7 x10E3/uL (ref 3.4–10.8)

## 2024-01-21 NOTE — Progress Notes (Signed)
 Remote PPM Transmission

## 2024-01-22 ENCOUNTER — Other Ambulatory Visit: Payer: Self-pay | Admitting: Cardiology

## 2024-01-22 ENCOUNTER — Ambulatory Visit: Payer: Self-pay | Admitting: Emergency Medicine

## 2024-01-24 ENCOUNTER — Ambulatory Visit: Payer: Self-pay | Admitting: Internal Medicine

## 2024-01-26 ENCOUNTER — Ambulatory Visit: Admitting: Emergency Medicine

## 2024-01-28 ENCOUNTER — Encounter

## 2024-02-02 ENCOUNTER — Telehealth: Payer: Self-pay | Admitting: *Deleted

## 2024-02-02 ENCOUNTER — Ambulatory Visit: Admitting: Emergency Medicine

## 2024-02-02 NOTE — Telephone Encounter (Signed)
 Cardiac Catheterization scheduled at [facility] for: Tuesday February 03, 2024 11 AM Arrival time [facility] Main Entrance A at: 6 AM-pre-procedure hydration  Diet: -Nothing to eat after midnight.  Hydration: -May drink clear liquids until 2 hours before the procedure.  No PO hydration (bottle of water ) 2 hours prior -going in early for IVF  CONTRAST ALLERGY: 13 hour Prednisone  and Benadryl  Prep: 02/02/24 Prednisone  50 mg 10 PM 02/03/24 Prednisone  50 mg 4 AM 02/03/24-Prednisone  50 mg and Benadryl  50 mg-pt will take with him to hospital and take at 9 AM-will let nursing staff know he has medication with him.   Medication instructions: -Hold:  Insulin -1/2 usual dose HS prior to procedure  Jardiance  -AM of procedure  Lasix /Losartan -day before and day of procedure-per protocol GFR < 60 (40)-pt already taken today  Ozempic-weekly on Fridays-pt did not take last Friday -Other usual morning medications can be taken including aspirin  81 mg and Plavix  75 mg.  Plan to go home the same day, you will only stay overnight if medically necessary.  You must have responsible adult to drive you home.  Someone must be with you the first 24 hours after you arrive home.  Reviewed procedure instructions/pre-procedure hydration with patient.

## 2024-02-03 ENCOUNTER — Ambulatory Visit (HOSPITAL_COMMUNITY)
Admission: RE | Disposition: A | Discharge status: Home / Self Care | Payer: Self-pay | Source: Home / Self Care | Attending: Cardiology

## 2024-02-03 ENCOUNTER — Other Ambulatory Visit: Payer: Self-pay

## 2024-02-03 ENCOUNTER — Ambulatory Visit (HOSPITAL_COMMUNITY)
Admission: RE | Admit: 2024-02-03 | Discharge: 2024-02-04 | Disposition: A | Discharge status: Home / Self Care | Attending: Cardiology | Admitting: Cardiology

## 2024-02-03 DIAGNOSIS — E118 Type 2 diabetes mellitus with unspecified complications: Secondary | ICD-10-CM

## 2024-02-03 DIAGNOSIS — I10 Essential (primary) hypertension: Secondary | ICD-10-CM | POA: Diagnosis present

## 2024-02-03 DIAGNOSIS — I25110 Atherosclerotic heart disease of native coronary artery with unstable angina pectoris: Secondary | ICD-10-CM | POA: Insufficient documentation

## 2024-02-03 DIAGNOSIS — N1832 Chronic kidney disease, stage 3b: Secondary | ICD-10-CM | POA: Diagnosis not present

## 2024-02-03 DIAGNOSIS — I25119 Atherosclerotic heart disease of native coronary artery with unspecified angina pectoris: Secondary | ICD-10-CM | POA: Diagnosis present

## 2024-02-03 DIAGNOSIS — Z7985 Long-term (current) use of injectable non-insulin antidiabetic drugs: Secondary | ICD-10-CM | POA: Insufficient documentation

## 2024-02-03 DIAGNOSIS — Z79899 Other long term (current) drug therapy: Secondary | ICD-10-CM | POA: Diagnosis not present

## 2024-02-03 DIAGNOSIS — I2510 Atherosclerotic heart disease of native coronary artery without angina pectoris: Secondary | ICD-10-CM | POA: Diagnosis not present

## 2024-02-03 DIAGNOSIS — Z951 Presence of aortocoronary bypass graft: Secondary | ICD-10-CM | POA: Diagnosis not present

## 2024-02-03 DIAGNOSIS — Z7982 Long term (current) use of aspirin: Secondary | ICD-10-CM | POA: Insufficient documentation

## 2024-02-03 DIAGNOSIS — I129 Hypertensive chronic kidney disease with stage 1 through stage 4 chronic kidney disease, or unspecified chronic kidney disease: Secondary | ICD-10-CM | POA: Diagnosis not present

## 2024-02-03 DIAGNOSIS — I2582 Chronic total occlusion of coronary artery: Secondary | ICD-10-CM | POA: Diagnosis not present

## 2024-02-03 DIAGNOSIS — E1169 Type 2 diabetes mellitus with other specified complication: Secondary | ICD-10-CM | POA: Diagnosis present

## 2024-02-03 DIAGNOSIS — Z955 Presence of coronary angioplasty implant and graft: Secondary | ICD-10-CM | POA: Diagnosis not present

## 2024-02-03 HISTORY — PX: LEFT HEART CATH AND CORS/GRAFTS ANGIOGRAPHY: CATH118250

## 2024-02-03 HISTORY — PX: CORONARY BALLOON ANGIOPLASTY: CATH118233

## 2024-02-03 HISTORY — PX: CORONARY IMAGING/OCT: CATH118326

## 2024-02-03 HISTORY — PX: CORONARY STENT INTERVENTION: CATH118234

## 2024-02-03 LAB — POCT ACTIVATED CLOTTING TIME
Activated Clotting Time: 279 s
Activated Clotting Time: 446 s
Activated Clotting Time: 654 s
Activated Clotting Time: 308 s
Activated Clotting Time: 0 s
Activated Clotting Time: 164 s
Activated Clotting Time: 182 s

## 2024-02-03 LAB — GLUCOSE, CAPILLARY
Glucose-Capillary: 144 mg/dL — ABNORMAL HIGH (ref 70–99)
Glucose-Capillary: 125 mg/dL — ABNORMAL HIGH (ref 70–99)
Glucose-Capillary: 121 mg/dL — ABNORMAL HIGH (ref 70–99)

## 2024-02-03 MED ORDER — ASPIRIN 81 MG PO TBEC
81.0000 mg | DELAYED_RELEASE_TABLET | Freq: Every day | ORAL | Status: DC
  Administered 2024-02-04: 81 mg via ORAL
  Filled 2024-02-03: qty 1

## 2024-02-03 MED ORDER — VITAMIN D3 25 MCG (1000 UNIT) PO TABS
1000.0000 [IU] | ORAL_TABLET | Freq: Every day | ORAL | Status: DC
  Administered 2024-02-03: 1000 [IU] via ORAL
  Filled 2024-02-03 (×2): qty 1

## 2024-02-03 MED ORDER — MAGNESIUM OXIDE -MG SUPPLEMENT 500 MG PO TABS: 500.0000 mg | ORAL_TABLET | Freq: Every day | ORAL | Status: DC

## 2024-02-03 MED ORDER — SODIUM CHLORIDE 0.9 % IV SOLN: 250.0000 mL | INTRAVENOUS | Status: DC | PRN

## 2024-02-03 MED ORDER — SODIUM CHLORIDE 0.9 % IV SOLN: INTRAVENOUS | Status: AC

## 2024-02-03 MED ORDER — PANTOPRAZOLE SODIUM 40 MG PO TBEC
40.0000 mg | DELAYED_RELEASE_TABLET | Freq: Two times a day (BID) | ORAL | Status: DC
  Administered 2024-02-03 – 2024-02-04 (×2): 40 mg via ORAL
  Filled 2024-02-03 (×2): qty 1

## 2024-02-03 MED ORDER — ONDANSETRON HCL 4 MG/2ML IJ SOLN: 4.0000 mg | Freq: Four times a day (QID) | INTRAMUSCULAR | Status: DC | PRN

## 2024-02-03 MED ORDER — ASPIRIN 81 MG PO CHEW: 81.0000 mg | CHEWABLE_TABLET | ORAL | Status: DC

## 2024-02-03 MED ORDER — LABETALOL HCL 5 MG/ML IV SOLN: 10.0000 mg | INTRAVENOUS | Status: AC | PRN

## 2024-02-03 MED ORDER — NITROGLYCERIN 1 MG/10 ML FOR IR/CATH LAB
INTRA_ARTERIAL | Status: AC
  Filled 2024-02-03: qty 10

## 2024-02-03 MED ORDER — OXYCODONE HCL 5 MG PO TABS
5.0000 mg | ORAL_TABLET | Freq: Once | ORAL | Status: AC
  Administered 2024-02-03: 5 mg via ORAL
  Filled 2024-02-03: qty 1

## 2024-02-03 MED ORDER — FENTANYL CITRATE (PF) 100 MCG/2ML IJ SOLN
INTRAMUSCULAR | Status: DC | PRN
  Administered 2024-02-03: 50 ug via INTRAVENOUS
  Administered 2024-02-03 (×2): 25 ug via INTRAVENOUS

## 2024-02-03 MED ORDER — ISOSORBIDE MONONITRATE ER 60 MG PO TB24
60.0000 mg | ORAL_TABLET | Freq: Every day | ORAL | Status: DC
  Administered 2024-02-04: 60 mg via ORAL
  Filled 2024-02-03: qty 1

## 2024-02-03 MED ORDER — HEPARIN SODIUM (PORCINE) 1000 UNIT/ML IJ SOLN
INTRAMUSCULAR | Status: AC
  Filled 2024-02-03: qty 10

## 2024-02-03 MED ORDER — RANOLAZINE ER 500 MG PO TB12
500.0000 mg | ORAL_TABLET | Freq: Two times a day (BID) | ORAL | Status: DC
  Administered 2024-02-03 – 2024-02-04 (×2): 500 mg via ORAL
  Filled 2024-02-03 (×2): qty 1

## 2024-02-03 MED ORDER — NITROGLYCERIN 0.4 MG SL SUBL
SUBLINGUAL_TABLET | SUBLINGUAL | Status: AC
  Administered 2024-02-03: 0.4 mg
  Filled 2024-02-03: qty 1

## 2024-02-03 MED ORDER — NITROGLYCERIN 0.4 MG SL SUBL
SUBLINGUAL_TABLET | SUBLINGUAL | Status: AC
  Filled 2024-02-03: qty 1

## 2024-02-03 MED ORDER — ATORVASTATIN CALCIUM 80 MG PO TABS
80.0000 mg | ORAL_TABLET | Freq: Every day | ORAL | Status: DC
Start: 2024-02-03 — End: 2024-02-04
  Administered 2024-02-03: 80 mg via ORAL
  Filled 2024-02-03: qty 1

## 2024-02-03 MED ORDER — HEPARIN (PORCINE) IN NACL 1000-0.9 UT/500ML-% IV SOLN
INTRAVENOUS | Status: DC | PRN
  Administered 2024-02-03: 1000 mL via SURGICAL_CAVITY
  Administered 2024-02-03: 500 mL via SURGICAL_CAVITY

## 2024-02-03 MED ORDER — NITROGLYCERIN 1 MG/10 ML FOR IR/CATH LAB
INTRA_ARTERIAL | Status: DC | PRN
  Administered 2024-02-03 (×2): 200 ug via INTRACORONARY

## 2024-02-03 MED ORDER — AMLODIPINE BESYLATE 5 MG PO TABS
5.0000 mg | ORAL_TABLET | Freq: Every day | ORAL | Status: DC
  Administered 2024-02-03 – 2024-02-04 (×2): 5 mg via ORAL
  Filled 2024-02-03 (×2): qty 1

## 2024-02-03 MED ORDER — FENTANYL CITRATE (PF) 100 MCG/2ML IJ SOLN
INTRAMUSCULAR | Status: AC
  Filled 2024-02-03: qty 2

## 2024-02-03 MED ORDER — LIDOCAINE HCL (PF) 1 % IJ SOLN
INTRAMUSCULAR | Status: AC
  Filled 2024-02-03: qty 30

## 2024-02-03 MED ORDER — LIDOCAINE HCL (PF) 1 % IJ SOLN
INTRAMUSCULAR | Status: DC | PRN
  Administered 2024-02-03: 10 mL

## 2024-02-03 MED ORDER — GABAPENTIN 100 MG PO CAPS
200.0000 mg | ORAL_CAPSULE | Freq: Every day | ORAL | Status: DC
  Administered 2024-02-03: 200 mg via ORAL
  Filled 2024-02-03: qty 2

## 2024-02-03 MED ORDER — LOSARTAN POTASSIUM 25 MG PO TABS
12.5000 mg | ORAL_TABLET | Freq: Every day | ORAL | Status: DC
  Administered 2024-02-04: 12.5 mg via ORAL
  Filled 2024-02-03 (×2): qty 1

## 2024-02-03 MED ORDER — MORPHINE SULFATE (PF) 2 MG/ML IV SOLN
INTRAVENOUS | Status: DC | PRN
  Administered 2024-02-03 (×2): 1 mg via INTRAVENOUS
  Administered 2024-02-03: 2 mg via INTRAVENOUS

## 2024-02-03 MED ORDER — MORPHINE SULFATE (PF) 2 MG/ML IV SOLN
INTRAVENOUS | Status: AC
  Filled 2024-02-03: qty 1

## 2024-02-03 MED ORDER — SODIUM CHLORIDE 0.9% FLUSH: 3.0000 mL | INTRAVENOUS | Status: DC | PRN

## 2024-02-03 MED ORDER — NITROGLYCERIN 0.4 MG SL SUBL
0.4000 mg | SUBLINGUAL_TABLET | SUBLINGUAL | Status: AC | PRN
  Administered 2024-02-03 (×3): 0.4 mg via SUBLINGUAL
  Filled 2024-02-03 (×2): qty 1

## 2024-02-03 MED ORDER — SODIUM CHLORIDE 0.9 % WEIGHT BASED INFUSION: 1.0000 mL/kg/h | INTRAVENOUS | Status: DC

## 2024-02-03 MED ORDER — NITROGLYCERIN IN D5W 200-5 MCG/ML-% IV SOLN
0.0000 ug/min | INTRAVENOUS | Status: DC
  Administered 2024-02-03: 20 ug/min via INTRAVENOUS
  Administered 2024-02-03: 10 ug/min via INTRAVENOUS
  Filled 2024-02-03: qty 250

## 2024-02-03 MED ORDER — ONDANSETRON HCL 4 MG/2ML IJ SOLN
4.0000 mg | Freq: Once | INTRAMUSCULAR | Status: AC
  Administered 2024-02-03: 4 mg via INTRAVENOUS
  Filled 2024-02-03: qty 2

## 2024-02-03 MED ORDER — MAGNESIUM OXIDE -MG SUPPLEMENT 400 (240 MG) MG PO TABS
400.0000 mg | ORAL_TABLET | Freq: Every day | ORAL | Status: DC
  Administered 2024-02-03 – 2024-02-04 (×2): 400 mg via ORAL
  Filled 2024-02-03 (×2): qty 1

## 2024-02-03 MED ORDER — ACETAMINOPHEN 325 MG PO TABS: 650.0000 mg | ORAL_TABLET | ORAL | Status: DC | PRN

## 2024-02-03 MED ORDER — HYDRALAZINE HCL 20 MG/ML IJ SOLN: 10.0000 mg | INTRAMUSCULAR | Status: AC | PRN

## 2024-02-03 MED ORDER — CLOPIDOGREL BISULFATE 75 MG PO TABS
75.0000 mg | ORAL_TABLET | Freq: Every day | ORAL | Status: DC
  Administered 2024-02-04: 75 mg via ORAL
  Filled 2024-02-03: qty 1

## 2024-02-03 MED ORDER — SODIUM CHLORIDE 0.9 % WEIGHT BASED INFUSION
3.0000 mL/kg/h | INTRAVENOUS | Status: DC
  Administered 2024-02-03: 3 mL/kg/h via INTRAVENOUS

## 2024-02-03 MED ORDER — SODIUM CHLORIDE 0.9% FLUSH
3.0000 mL | Freq: Two times a day (BID) | INTRAVENOUS | Status: DC
  Administered 2024-02-03 – 2024-02-04 (×2): 3 mL via INTRAVENOUS

## 2024-02-03 MED ORDER — MIDAZOLAM HCL 2 MG/2ML IJ SOLN
INTRAMUSCULAR | Status: AC
  Filled 2024-02-03: qty 2

## 2024-02-03 MED ORDER — MIDAZOLAM HCL 2 MG/2ML IJ SOLN
INTRAMUSCULAR | Status: DC | PRN
Start: 2024-02-03 — End: 2024-02-03
  Administered 2024-02-03 (×2): 1 mg via INTRAVENOUS

## 2024-02-03 MED ORDER — CARVEDILOL 12.5 MG PO TABS
12.5000 mg | ORAL_TABLET | Freq: Two times a day (BID) | ORAL | Status: DC
  Administered 2024-02-04: 12.5 mg via ORAL
  Filled 2024-02-03: qty 1

## 2024-02-03 MED ORDER — IOHEXOL 350 MG/ML SOLN
INTRAVENOUS | Status: DC | PRN
  Administered 2024-02-03: 130 mL

## 2024-02-03 MED ORDER — NITROGLYCERIN 0.4 MG SL SUBL
SUBLINGUAL_TABLET | SUBLINGUAL | Status: DC | PRN
  Administered 2024-02-03: .4 mg via SUBLINGUAL

## 2024-02-03 MED ORDER — ALBUTEROL SULFATE (2.5 MG/3ML) 0.083% IN NEBU: 3.0000 mL | INHALATION_SOLUTION | RESPIRATORY_TRACT | Status: DC | PRN

## 2024-02-03 MED ORDER — HEPARIN SODIUM (PORCINE) 1000 UNIT/ML IJ SOLN
INTRAMUSCULAR | Status: DC | PRN
  Administered 2024-02-03: 3000 [IU] via INTRAVENOUS
  Administered 2024-02-03: 9000 [IU] via INTRAVENOUS
  Administered 2024-02-03: 3000 [IU] via INTRAVENOUS

## 2024-02-03 NOTE — Progress Notes (Signed)
 Site area: R femoral (artery) Site Prior to Removal:  Level 0 Pressure Applied For: 30 min Manual:   yes Patient Status During Pull: stable  Post Pull Site:  Level 0 Post Pull Instructions Given:  yes  Post Pull Pulses Present: bilateral DP +1 Dressing Applied:  gauze & tegaderm Bedrest begins @ 1910 (x4hours) Comments:

## 2024-02-03 NOTE — Interval H&P Note (Signed)
 History and Physical Interval Note:  02/03/2024 7:23 AM  John Dodson  has presented today for surgery, with the diagnosis of Coronary Artery Disease with Class III-IV Angina.  The various methods of treatment have been discussed with the patient and family. After consideration of risks, benefits and other options for treatment, the patient has consented to  Procedure(s): BYPASS GRAFT ANGIOGRAPHY (N/A)  POSSIBLE PERCUTANEOUS CORONARY INTERVENTION   as a surgical intervention.  The patient's history has been reviewed, patient examined, no change in status, stable for surgery.  I have reviewed the patient's chart and labs.  Questions were answered to the patient's satisfaction.    Cath Lab Visit (complete for each Cath Lab visit)  Clinical Evaluation Leading to the Procedure:   ACS: No.  Non-ACS:    Anginal Classification: CCS III  Anti-ischemic medical therapy: Maximal Therapy (2 or more classes of medications)  Non-Invasive Test Results: No non-invasive testing performed  Prior CABG: Previous CABG    Alm Clay

## 2024-02-04 ENCOUNTER — Other Ambulatory Visit (HOSPITAL_COMMUNITY): Payer: Self-pay

## 2024-02-04 ENCOUNTER — Encounter (HOSPITAL_COMMUNITY): Payer: Self-pay | Admitting: Cardiology

## 2024-02-04 DIAGNOSIS — I25110 Atherosclerotic heart disease of native coronary artery with unstable angina pectoris: Secondary | ICD-10-CM | POA: Diagnosis not present

## 2024-02-04 LAB — BASIC METABOLIC PANEL WITH GFR
Anion gap: 18 — ABNORMAL HIGH (ref 5–15)
BUN: 33 mg/dL — ABNORMAL HIGH (ref 8–23)
CO2: 15 mmol/L — ABNORMAL LOW (ref 22–32)
Calcium: 9.1 mg/dL (ref 8.9–10.3)
Chloride: 107 mmol/L (ref 98–111)
Creatinine, Ser: 1.67 mg/dL — ABNORMAL HIGH (ref 0.61–1.24)
GFR, Estimated: 46 mL/min — ABNORMAL LOW (ref >60)
Glucose, Bld: 210 mg/dL — ABNORMAL HIGH (ref 70–99)
Potassium: 4.8 mmol/L (ref 3.5–5.1)
Sodium: 140 mmol/L (ref 135–145)

## 2024-02-04 LAB — GLUCOSE, CAPILLARY: Glucose-Capillary: 185 mg/dL — ABNORMAL HIGH (ref 70–99)

## 2024-02-04 LAB — CBC
HCT: 46.6 % (ref 39.0–52.0)
Hemoglobin: 15.1 g/dL (ref 13.0–17.0)
MCH: 29.4 pg (ref 26.0–34.0)
MCHC: 32.4 g/dL (ref 30.0–36.0)
MCV: 90.8 fL (ref 80.0–100.0)
Platelets: 153 K/uL (ref 150–400)
RBC: 5.13 MIL/uL (ref 4.22–5.81)
RDW: 14 % (ref 11.5–15.5)
WBC: 12.9 K/uL — ABNORMAL HIGH (ref 4.0–10.5)
nRBC: 0 % (ref 0.0–0.2)

## 2024-02-04 LAB — POCT ACTIVATED CLOTTING TIME
Activated Clotting Time: 469 s
Activated Clotting Time: 469 s

## 2024-02-04 MED ORDER — ISOSORBIDE MONONITRATE ER 30 MG PO TB24
30.0000 mg | ORAL_TABLET | Freq: Every day | ORAL | 1 refills | Status: DC
  Filled 2024-02-04: qty 90, 90d supply, fill #0

## 2024-02-04 MED ORDER — OXYCODONE HCL 5 MG PO TABS
5.0000 mg | ORAL_TABLET | Freq: Once | ORAL | Status: AC
  Administered 2024-02-04: 5 mg via ORAL
  Filled 2024-02-04: qty 1

## 2024-02-04 MED FILL — Nitroglycerin IV Soln 100 MCG/ML in D5W: INTRA_ARTERIAL | Qty: 10 | Status: AC

## 2024-02-04 MED FILL — Morphine Sulfate Inj PF 2 MG/ML: INTRAMUSCULAR | Qty: 1 | Status: AC

## 2024-02-04 NOTE — Discharge Summary (Signed)
 Discharge Summary   Patient ID: AUSTEN OYSTER MRN: [id number]; DOB: [birth year]  Admit date: 02/03/2024 Discharge date: 02/04/2024  PCP:  Delilah Murray HERO., MD   [facility] Providers Cardiologist:  Alm Clay, MD  Electrophysiologist:  Danelle Birmingham, MD   Discharge Diagnoses  Principal Problem:   Coronary artery disease involving native coronary artery of native heart with angina pectoris Active Problems:   Hyperlipidemia associated with type 2 diabetes mellitus [facility])   Essential hypertension   Type 2 diabetes mellitus with complication, with long-term current use of insulin  [facility])  Diagnostic Studies/Procedures   Cath: 02/03/2024    Mid LM to Ost LAD lesion is 20% stenosed with 0% stenosed side branch in Ost Cx to Prox Cx.   CULPRIT LESION: Ost LAD to Prox LAD lesion is 99% stenosed.   Angioplasty was performed in the main branch. Balloon angioplasty was performed using a 3.5 mm x 18 mm [place] balloon   A drug-eluting stent was successfully placed from the Left Main into the proximal LAD overlapping the LCx ostium (reverse culotte technique) using a STENT ONYX FRONTIER 3.5X12 in the Left Main-Proximal LAD branch-overlapping the previously placed LAD stent and into the LM stent.  Stent was reported to 3.6 mm with POT dilation in the LM with a 4.5 mm balloon to 4.6 mm..   Kissing balloon angioplasty with 3.5 mm [place] balloons from the LM into ostial LAD and ostial LCx. Post intervention, there is a 0% residual stenosis. Post intervention, the side branch was reduced to 20% residual stenosis.   ---------------------------------------------   Prox LAD to Mid LAD stent is 10% stenosed.  Mid LAD to Dist LAD lesion is 100% stenosed.   Previously placed prox Cx to Mid Cx lesion is widely patent; Previously placed stent in the 1st LPL is widely patent   LPDA lesion is 5% stenosed.  LPAV lesion is 50% stenosed.  2nd Mrg lesion is 45% stenosed.  Lat 2nd Mrg lesion is 50% stenosed.    Ost RCA to Mid RCA lesion is 100% stenosed.   ------------------GRAFTs---------------------------   in the main branch and side branch.   in the main branch and side branch.   Angioplasty was performed in the main branch. .   Angioplasty was performed in the main branch and side branch. .   Angioplasty was performed in the main branch and side branch. .   Post intervention, there is a 10% residual stenosis.   LIMA-dLAD graft was visualized by angiography and is normal in caliber.   Post intervention, the side branch was reduced to 20% residual stenosis.   RIMA-PDA graft was visualized by angiography and is moderate in size.   Left radial artery graft was not visualized due to known occlusion.  Origin to Prox Graft lesion before Lat 2nd Mrg is 100% stenosed.   Diagnostic:  Dominance: Left                                                     Intervention   Jailed Ostial LAD 99% stenosis in the unstented area => successful difficult OCT guided reverse culotte DES PCI of the ostial LAD with kissing balloon angioplasty of the ostial LCx after stent placement-placed a 3.5 mm x 12 mm Onyx Frontier DES deployed to 3.6 mm in LAD and postdilated to 4.6 mm in  the Left Main. Widely patent LIMA-LAD and RIMA-PDA   Normal to low LVEDP    Complicated procedure: Very difficult lesion to wire, difficult guide support, multiple different balloons with difficulty advancing balloons through the lesion.  Reverse culotte stenting with kissing balloon bifurcation angioplasty.  Very long procedure, due to difficulty with advancing wires and balloons.   RECOMMENDATIONS   In the absence of any other complications or medical issues, we expect the patient to be ready for discharge from an interventional cardiology perspective on 02/04/2024. ->  He was maintained overnight outpatient with extended recovery to allow for IV hydration and femoral sheath removal.   Recommend dual antiplatelet therapy with Aspirin  81mg  daily  and Clopidogrel  75mg  daily.  Continue other home medications with increasing Imdur  to 30 mg daily.         Alm Clay, MD _____________   History of Present Illness   John Dodson is a 63 y.o. male with past medical history of CAD status post multiple prior stents, CABG x 4 '21 (LIMA to distal LAD, sequential left radial to OM 2 to PDA, RIMA to ramus), complete heart block status post BiV PPM '18, hypertension, hyperlipidemia, PE, CKD stage III, diabetes who is followed by Dr. Clay as an outpatient. Cardiac catheterization prior to bypass surgery in 07/2019 showed EF 55 to 65%, 60% ostial to proximal left circumflex lesion, 60% mid to distal LAD lesion, widely patent OM2 stent, 80% lateral second marginal lesion, widely patent stent in the first LPL, 50% RPA V lesion. Echocardiogram in 2021 showed EF 55 to 60%, normal LV function. Lexiscan  in 09/2021 for chest pain was low risk, EF 62%. At that time it was felt that his chest discomfort was related to incision/scar pain. In January 2024 he continued to note intermittent chest discomfort, predominantly at rest though he did report some exertional symptoms. He was started on Ranexa  500 mg twice daily with improvement.   On 04/28/2023 he underwent cardiac catheterization, there was noted progression of native CAD with a now occluded RCA stent as well as mid LAD at LIMA insertion site. There is widely patent pedicled LIMA to LAD and pedicle RIMA to LDPA with flush occlusion of sequential left radial, not felt to be acute graft failure. His Ranexa  was increased to 1000 mg twice daily with recommendation to reassess for symptom improvement over the next 2 to 4 weeks, if unable to control symptoms recommended return for staged PCI of the left main to ostial LCx.   Seen back in follow-up in January 2025 but continued to have chest discomfort despite medical therapy.  He ultimately underwent cardiac catheterization 05/21/2023 with angioplasty of left  main into ostial/proximal circumflex with DES x 1.  Recommendations for continuation of DAPT with aspirin /Plavix  for at least 6 months.  Echocardiogram 06/2023 with LVEF of 60 to 65%, no regional wall motion abnormality, mild LVH, grade 1 diastolic dysfunction, normally RV function.   Seen in the office 08/2023 with Dr. Clay and was having no further episodes of angina.  Presented to the ED 12/2023 for recurrent chest pain and cardiology was consulted.  It was recommended that his amlodipine  be increased to 5 mg daily also started on Imdur  15 mg daily with continuation of carvedilol  12.5 mg twice daily and Ranexa  1000 mg twice daily.  He was seen back in the office on 9/22 stating he continued to have chest discomfort and was ultimately set up for outpatient cardiac catheterization   Hospital Course  CAD -- Underwent outpatient cardiac catheterization on 10/7 with patent LIMA to LAD, RIMA to PDA culprit lesion being 99% ostial/proximal LAD with apical but OTC guided reverse culotte DES/PCI of ostial LAD with kissing balloon angioplasty of ostial circumflex after stent placement.  Recommendations for continued DAPT with aspirin /Plavix  with continuation of other home medications, increased Imdur  to 30 mg daily.  No recurrent chest pain post cath.  Seen by cardiac rehab. -- Continue aspirin , Plavix , atorvastatin  80 mg daily  Complete heart block status post BiV PPM '18 -- Sinus rhythm, V paced on telemetry  Hypertension -- Controlled -- Continue Coreg  12.5 mg twice daily, amlodipine  5 mg daily, Imdur  30 mg daily, losartan  12.5 mg daily  Hyperlipidemia -- LDL 59 on 10/2023  -- Continue atorvastatin  80 mg daily, Repatha  PTA  CKD stage IIIb -- baseline Creatinine around 1.7-1.8, noted at 1.67 post-PCI  General: Well developed, well nourished, male appearing in no acute distress. Head: Normocephalic, atraumatic.  Neck: Supple without bruits, JVD. Lungs:  Resp regular and unlabored,  CTA. Heart: RRR, S1, S2, no S3, S4, soft systolic murmur LLSB; no rub. Abdomen: Soft, non-tender, non-distended with normoactive bowel sounds. No hepatomegaly. No rebound/guarding. No obvious abdominal masses. Extremities: No clubbing, cyanosis, edema. Distal pedal pulses are 2+ bilaterally. Right femoral cath site stable without bruising or hematoma Neuro: Alert and oriented X 3. Moves all extremities spontaneously. Psych: Normal affect.   Did the patient have an acute coronary syndrome (MI, NSTEMI, STEMI, etc) this admission?:  No                               Did the patient have a percutaneous coronary intervention (stent / angioplasty)?:  Yes.     Cath/PCI Registry Performance & Quality Measures: Aspirin  prescribed? - Yes ADP Receptor Inhibitor (Plavix /Clopidogrel , Brilinta/Ticagrelor or Effient/Prasugrel) prescribed (includes medically managed patients)? - Yes High Intensity Statin (Lipitor  40-80mg  or Crestor 20-40mg ) prescribed? - Yes For EF <40%, was ACEI/ARB prescribed? - Not Applicable (EF >/= 40%) For EF <40%, Aldosterone Antagonist (Spironolactone or Eplerenone) prescribed? - Not Applicable (EF >/= 40%) Cardiac Rehab Phase II ordered? - Yes       The patient will be scheduled for a TOC follow up appointment in 14 days.  A message has been sent to the [facility] and Scheduling Pool at the office where the patient should be seen for follow up.  _____________  Discharge Vitals Blood pressure (!) 128/53, pulse 76, temperature 97.8 F (36.6 C), temperature source Oral, resp. rate 16, height 5' 8 (1.727 m), weight 88 kg, SpO2 100%.  Filed Weights   02/03/24 0912  Weight: 88 kg    Labs & Radiologic Studies  CBC Recent Labs    02/04/24 0454  WBC 12.9*  HGB 15.1  HCT 46.6  MCV 90.8  PLT 153   Basic Metabolic Panel Recent Labs    89/91/74 0454  NA 140  K 4.8  CL 107  CO2 15*  GLUCOSE 210*  BUN 33*  CREATININE 1.67*  CALCIUM  9.1   Liver Function Tests No  results for input(s): AST, ALT, ALKPHOS, BILITOT, PROT, ALBUMIN  in the last 72 hours. No results for input(s): LIPASE, AMYLASE in the last 72 hours. High Sensitivity Troponin:   Recent Labs  Lab 01/16/24 1240 01/16/24 1440  TROPONINIHS 8 8    No results for input(s): TRNPT in the last 720 hours.  BNP Invalid input(s): POCBNP No results for  input(s): PROBNP in the last 72 hours.  No results for input(s): BNP in the last 72 hours.  D-Dimer No results for input(s): DDIMER in the last 72 hours. Hemoglobin A1C No results for input(s): HGBA1C in the last 72 hours. Fasting Lipid Panel No results for input(s): CHOL, HDL, LDLCALC, TRIG, CHOLHDL, LDLDIRECT in the last 72 hours. Lipoprotein (a)  Date/Time Value Ref Range Status  04/26/2023 02:31 AM 10.6 <75.0 nmol/L Final    Comment:    (NOTE) Note:  Values greater than or equal to 75.0 nmol/L may       indicate an independent risk factor for CHD,       but must be evaluated with caution when applied       to non-Caucasian populations due to the       influence of genetic factors on Lp(a) across       ethnicities. Performed At: [facility] [address] [place], [place] [id number] Jennette Shorter MD Ey:[phone]     Thyroid Function Tests No results for input(s): TSH, T4TOTAL, T3FREE, THYROIDAB in the last 72 hours.  Invalid input(s): FREET3 _____________  CARDIAC CATHETERIZATION Addendum Date: 02/03/2024   Mid LM to Ost LAD lesion is 20% stenosed with 0% stenosed side branch in Ost Cx to Prox Cx.   CULPRIT LESION: Ost LAD to Prox LAD lesion is 99% stenosed.   Angioplasty was performed in the main branch. Balloon angioplasty was performed using a 3.5 mm x 18 mm [place] balloon   A drug-eluting stent was successfully placed from the Left Main into the proximal LAD overlapping the LCx ostium (reverse culotte technique) using a STENT ONYX FRONTIER 3.5X12 in the Left Main-Proximal LAD  branch-overlapping the previously placed LAD stent and into the LM stent.  Stent was reported to 3.6 mm with POT dilation in the LM with a 4.5 mm balloon to 4.6 mm..   Kissing balloon angioplasty with 3.5 mm [place] balloons from the LM into ostial LAD and ostial LCx. Post intervention, there is a 0% residual stenosis. Post intervention, the side branch was reduced to 20% residual stenosis.   ---------------------------------------------   Prox LAD to Mid LAD stent is 10% stenosed.  Mid LAD to Dist LAD lesion is 100% stenosed.   Previously placed prox Cx to Mid Cx lesion is widely patent; Previously placed stent in the 1st LPL is widely patent   LPDA lesion is 5% stenosed.  LPAV lesion is 50% stenosed.  2nd Mrg lesion is 45% stenosed.  Lat 2nd Mrg lesion is 50% stenosed.   Ost RCA to Mid RCA lesion is 100% stenosed.   ------------------GRAFTs---------------------------   in the main branch and side branch.   in the main branch and side branch.   Angioplasty was performed in the main branch. .   Angioplasty was performed in the main branch and side branch. .   Angioplasty was performed in the main branch and side branch. .   Post intervention, there is a 10% residual stenosis.   LIMA-dLAD graft was visualized by angiography and is normal in caliber.   Post intervention, the side branch was reduced to 20% residual stenosis.   RIMA-PDA graft was visualized by angiography and is moderate in size.   Left radial artery graft was not visualized due to known occlusion.  Origin to Prox Graft lesion before Lat 2nd Mrg is 100% stenosed. Diagnostic:  Dominance: Left     Intervention  Jailed Ostial LAD 99% stenosis in the unstented area =>  successful difficult OCT guided reverse culotte DES PCI of the ostial LAD with kissing balloon angioplasty of the ostial LCx after stent placement-placed a 3.5 mm x 12 mm Onyx Frontier DES deployed to 3.6 mm in LAD and postdilated to 4.6 mm in the Left Main. Widely patent LIMA-LAD and RIMA-PDA   Normal to low LVEDP Complicated procedure: Very difficult lesion to wire, difficult guide support, multiple different balloons with difficulty advancing balloons through the lesion.  Reverse culotte stenting with kissing balloon bifurcation angioplasty.  Very long procedure, due to difficulty with advancing wires and balloons. RECOMMENDATIONS   In the absence of any other complications or medical issues, we expect the patient to be ready for discharge from an interventional cardiology perspective on 02/04/2024. ->  He was maintained overnight outpatient with extended recovery to allow for IV hydration and femoral sheath removal.   Recommend dual antiplatelet therapy with Aspirin  81mg  daily and Clopidogrel  75mg  daily.  Continue other home medications with increasing Imdur  to 30 mg daily. Alm Clay, MD  Addendum Date: 02/03/2024   Mid LM to Ost LAD lesion is 20% stenosed with 0% stenosed side branch in Ost Cx to Prox Cx.   CULPRIT LESION: Ost LAD to Prox LAD lesion is 99% stenosed.   Angioplasty was performed in the main branch. Balloon angioplasty was performed using a 3.5 mm x 18 mm [place] balloon   A drug-eluting stent was successfully placed from the Left Main into the proximal LAD overlapping the LCx ostium (reverse culotte technique) using a STENT ONYX FRONTIER 3.5X12 in the Left Main-Proximal LAD branch-overlapping the previously placed LAD stent and into the LM stent.  Stent was reported to 3.6 mm with POT dilation in the LM with a 4.5 mm balloon to 4.6 mm..   Kissing balloon angioplasty with 3.5 mm [place] balloons from the LM into ostial LAD and ostial LCx. Post intervention, there is a 0% residual stenosis. Post intervention, the side branch was reduced to 20% residual stenosis.   ---------------------------------------------   Prox LAD to Mid LAD stent is 10% stenosed.  Mid LAD to Dist LAD lesion is 100% stenosed.   Previously placed prox Cx to Mid Cx lesion is widely patent; Previously placed stent in the 1st  LPL is widely patent   LPDA lesion is 5% stenosed.  LPAV lesion is 50% stenosed.  2nd Mrg lesion is 45% stenosed.  Lat 2nd Mrg lesion is 50% stenosed.   Ost RCA to Mid RCA lesion is 100% stenosed.   ------------------GRAFTs---------------------------   in the main branch and side branch.   in the main branch and side branch.   Angioplasty was performed in the main branch. .   Angioplasty was performed in the main branch and side branch. .   Angioplasty was performed in the main branch and side branch. .   Post intervention, there is a 10% residual stenosis.   LIMA-dLAD graft was visualized by angiography and is normal in caliber.   Post intervention, the side branch was reduced to 20% residual stenosis.   RIMA-PDA graft was visualized by angiography and is moderate in size.   Left radial artery graft was not visualized due to known occlusion.  Origin to Prox Graft lesion before Lat 2nd Mrg is 100% stenosed. Diagnostic:  Dominance: Left     Intervention  Jailed Ostial LAD 99% stenosis in the unstented area => successful difficult OCT guided reverse culotte DES PCI of the ostial LAD with kissing balloon angioplasty of the ostial LCx  after stent placement-placed a 3.5 mm x 12 mm Onyx Frontier DES deployed to 3.6 mm in LAD and postdilated to 4.6 mm in the Left Main. Widely patent LIMA-LAD and RIMA-PDA  Normal to low LVEDP  RECOMMENDATIONS   In the absence of any other complications or medical issues, we expect the patient to be ready for discharge from an interventional cardiology perspective on 02/04/2024. ->  He was maintained overnight outpatient with extended recovery to allow for IV hydration and femoral sheath removal.   Recommend dual antiplatelet therapy with Aspirin  81mg  daily and Clopidogrel  75mg  daily.  Continue other home medications with increasing Imdur  to 30 mg daily. Alm Clay, MD  Result Date: 02/03/2024 Table formatting from the original result was not included. Images from the original result  were not included.   Mid LM to Ost LAD lesion is 20% stenosed with 0% stenosed side branch in Ost Cx to Prox Cx.   CULPRIT LESION: Ost LAD to Prox LAD lesion is 99% stenosed.   Angioplasty was performed in the main branch. Balloon angioplasty was performed using a 3.5 mm x 18 mm [place] balloon   A drug-eluting stent was successfully placed from the Left Main into the proximal LAD overlapping the LCx ostium (reverse culotte technique) using a STENT ONYX FRONTIER 3.5X12 in the Left Main-Proximal LAD branch-overlapping the previously placed LAD stent and into the LM stent.  Stent was reported to 3.6 mm with POT dilation in the LM with a 4.5 mm balloon to 4.6 mm..   Kissing balloon angioplasty with 3.5 mm [place] balloons from the LM into ostial LAD and ostial LCx. Post intervention, there is a 0% residual stenosis. Post intervention, the side branch was reduced to 20% residual stenosis.   ---------------------------------------------   Prox LAD to Mid LAD stent is 10% stenosed.  Mid LAD to Dist LAD lesion is 100% stenosed.   Previously placed prox Cx to Mid Cx lesion is widely patent; Previously placed stent in the 1st LPL is widely patent   LPDA lesion is 5% stenosed.  LPAV lesion is 50% stenosed.  2nd Mrg lesion is 45% stenosed.  Lat 2nd Mrg lesion is 50% stenosed.   Ost RCA to Mid RCA lesion is 100% stenosed.   ------------------GRAFTs---------------------------   in the main branch and side branch.   in the main branch and side branch.   Angioplasty was performed in the main branch. .   Angioplasty was performed in the main branch and side branch. .   Angioplasty was performed in the main branch and side branch. .   Post intervention, there is a 10% residual stenosis.   LIMA-dLAD graft was visualized by angiography and is normal in caliber.   Post intervention, the side branch was reduced to 20% residual stenosis.   RIMA-PDA graft was visualized by angiography and is moderate in size.   Left radial artery graft was not  visualized due to known occlusion.  Origin to Prox Graft lesion before Lat 2nd Mrg is 100% stenosed. Diagnostic:  Dominance: Left     Intervention  Jailed Ostial LAD 99% stenosis in the unstented area => successful difficult OCT guided reverse culotte DES PCI of the ostial LAD with kissing balloon angioplasty of the ostial LCx after stent placement-placed a 3.5 mm x 12 mm Onyx Frontier DES deployed to 3.6 mm in LAD and postdilated to 4.6 mm in the Left Main. Widely patent LIMA-LAD and RIMA-PDA  Normal to low LVEDP  RECOMMENDATIONS   In the  absence of any other complications or medical issues, we expect the patient to be ready for discharge from an interventional cardiology perspective on 02/04/2024. ->  He was maintained overnight outpatient with extended recovery to allow for IV hydration and femoral sheath removal.   Recommend dual antiplatelet therapy with Aspirin  81mg  daily and Clopidogrel  75mg  daily.  Continue other home medications with increasing Imdur  to 30 mg daily. Alm Clay, MD  DG Chest 2 View Result Date: 01/16/2024 CLINICAL DATA:  Left-sided chest pain radiating to left arm. EXAM: CHEST - 2 VIEW COMPARISON:  04/25/2023 FINDINGS: The heart size and mediastinal contours are within normal limits. Triple lead transvenous pacemaker remains in expected position. Prior median sternotomy again noted. Both lungs are clear. The visualized skeletal structures are unremarkable. IMPRESSION: No active cardiopulmonary disease. Electronically Signed   By: Norleen DELENA Kil M.D.   On: 01/16/2024 14:12   CUP PACEART REMOTE DEVICE CHECK Result Date: 01/15/2024 Monthly battery check.  Estimated 5 mo Normal device function. LV threshold 2.7 volts at 0.9 ms and amplitude is only programmed to 2.0 volts at 0.9 ms, sent to triage. Follow up as scheduled monthly ML, CVRS   Disposition Pt is being discharged home today in good condition.  Follow-up Plans & Appointments  Discharge Instructions     AMB Referral to  Cardiac Rehabilitation - Phase II   Complete by: As directed    Diagnosis:  Stable Angina Coronary Stents     After initial evaluation and assessments completed: Virtual Based Care may be provided alone or in conjunction with Phase 2 Cardiac Rehab based on patient barriers.: Yes   Intensive Cardiac Rehabilitation (ICR) [facility] location only OR Traditional Cardiac Rehabilitation (TCR) *If criteria for ICR are not met will enroll in TCR [facility] only): Yes   Diet - low sodium heart healthy   Complete by: As directed    Increase activity slowly   Complete by: As directed        Discharge Medications Allergies as of 02/04/2024       Reactions   Contrast Media [iodinated Contrast Media] Rash, Other (See Comments)   Reportedly cardiac arrest   Integrilin [eptifibatide] Shortness Of Breath, Other (See Comments)   Reportedly had shortness of breath, confusion.   Nsaids Other (See Comments)   Contraindication due to having one kidney and low GFR    Wound Dressing Adhesive Hives   Codeine Nausea And Vomiting   Glipizide Rash, Other (See Comments)   Headache   Zithromax [azithromycin] Nausea And Vomiting        Medication List     STOP taking these medications    predniSONE  50 MG tablet Commonly known as: DELTASONE        TAKE these medications    Accu-Chek FastClix Lancets Misc Use to check blood sugar 3 times daily   Accu-Chek Guide Test test strip Generic drug: glucose blood 1 each by Other route as needed for other.   Accu-Chek Guide w/Device Kit   albuterol  108 (90 Base) MCG/ACT inhaler Commonly known as: VENTOLIN  HFA Inhale 2 puffs into the lungs every 4 (four) hours as needed for wheezing or shortness of breath.   allopurinol 300 MG tablet Commonly known as: ZYLOPRIM Take 300 mg by mouth daily.   amLODipine  5 MG tablet Commonly known as: NORVASC  Take 1 tablet (5 mg total) by mouth daily.   aspirin  EC 81 MG tablet Take 1 tablet (81 mg total) by mouth daily.  Swallow whole.   atorvastatin  80 MG  tablet Commonly known as: LIPITOR  TAKE 1 TABLET BY MOUTH DAILY What changed: when to take this   BD Pen Needle Nano 2nd Gen 32G X 4 MM Misc Generic drug: Insulin  Pen Needle SMARTSIG:1 Each Every Evening   carvedilol  12.5 MG tablet Commonly known as: COREG  Take 1 tablet (12.5 mg total) by mouth 2 (two) times daily with a meal.   CAYENNE PEPPER PO Take 1 capsule by mouth in the morning and at bedtime.   clopidogrel  75 MG tablet Commonly known as: PLAVIX  TAKE 1 TABLET BY MOUTH DAILY   COLLAGEN PO Take 2 tablets by mouth in the morning and at bedtime.   [employer] Magnesium  500 MG Tabs Generic drug: Magnesium  Oxide -Mg Supplement Take 500 mg by mouth daily.   diphenhydrAMINE  50 MG tablet Commonly known as: BENADRYL  TAKE 1 TABLET PRIOR TO LEAVING HOME MORNING OF CATH.   Fish Oil Burp-Less 1000 MG Caps Take 2,000 mg by mouth daily.   fluorouracil  5 % cream Commonly known as: EFUDEX  Apply topically.   FreeStyle Libre 2 Plus Sensor Misc Use as directed to monitor blood sugar levels. Follow package directions for replacement.   furosemide  40 MG tablet Commonly known as: LASIX  TAKE 1 TABLET BY MOUTH DAILY AS NEEDED FOR FLUIDS OR EDEMA   gabapentin 100 MG capsule Commonly known as: NEURONTIN Take 200 mg by mouth at bedtime.   isosorbide  mononitrate 30 MG 24 hr tablet Commonly known as: IMDUR  Take 1 tablet (30 mg total) by mouth daily. What changed: how much to take   Jardiance  25 MG Tabs tablet Generic drug: empagliflozin  Take 25 mg by mouth daily.   Lantus  SoloStar 100 UNIT/ML Solostar Pen Generic drug: insulin  glargine Inject 55 Units into the skin every evening. 100UNITS/3 ML   losartan  25 MG tablet Commonly known as: COZAAR  Take 12.5 mg by mouth daily.   nitroGLYCERIN  0.4 MG SL tablet Commonly known as: NITROSTAT  DISSOLVE 1 TABLET UNDER THE  TONGUE EVERY 5 MINUTES AS NEEDED FOR CHEST PAIN. MAX OF 3 TABLETS IN 15 MINUTES. CALL  911 IF PAIN  PERSISTS.   Ozempic (0.25 or 0.5 MG/DOSE) 2 MG/3ML Sopn Generic drug: Semaglutide(0.25 or 0.5MG /DOS) Inject 0.5 mg into the skin once a week.   pantoprazole  40 MG tablet Commonly known as: PROTONIX  Take 40 mg by mouth 2 (two) times daily.   ranolazine  500 MG 12 hr tablet Commonly known as: Ranexa  Take 1 tablet (500 mg total) by mouth 2 (two) times daily.   Repatha  SureClick 140 MG/ML Soaj Generic drug: Evolocumab  Inject 140 mg into the skin every 14 (fourteen) days.   Vitamin D (Cholecalciferol) 25 MCG (1000 UT) Caps Take 1,000 Units by mouth at bedtime.         Outstanding Labs/Studies  N/a   Duration of Discharge Encounter: APP Time: 25 minutes   Signed, Manuelita Rummer, NP 02/04/2024, 9:02 AM

## 2024-02-04 NOTE — Progress Notes (Signed)
 Site Area: Right femoral Site Prior: Level 0 Pressure applied: total 40 mins Manual: yes Post pressure applied: level 0 Pulses: +2 (right) groin, popliteal and pedal  Dressing reapplied: 2100 New bedrest: 2100 (4 hours)  Comments: Pain complains of 10/10 lower lateral back pain. Assessment: no bruising, no apparent swelling and no boggy feeling. Patient states the pain begin during the procedure. Marty MD notified. MD came to bedside. See new orders.

## 2024-02-04 NOTE — Progress Notes (Signed)
 Paint is still persistent. 10/10 Hanna notified. See new orders

## 2024-02-04 NOTE — Progress Notes (Signed)
 Patient is without pain. Site is a level 0

## 2024-02-04 NOTE — TOC CM/SW Note (Signed)
 Transition of Care [facility]) - Inpatient Brief Assessment   Patient Details  Name: John Dodson MRN: [id number] Date of Birth: Dec 20, 1960  Transition of Care [facility]) CM/SW Contact:    Sudie Erminio Deems, RN Phone Number: 02/04/2024, 10:18 AM   Clinical Narrative: Patient presented for CAD-post angioplasty. PTA patient was from home with spouse. Patient has insurance and PCP. No home needs identified.            Transition of Care Asessment: Insurance and Status: Insurance coverage has been reviewed Patient has primary care physician: Yes Prior/Current Home Services: No current home services Social Drivers of Health Review: SDOH reviewed no interventions necessary Readmission risk has been reviewed: Yes Transition of care needs: no transition of care needs at this time

## 2024-02-04 NOTE — Progress Notes (Signed)
 CARDIAC REHAB PHASE I   PRE:  Rate/Rhythm: 70 pacing    BP: sitting 143/62    SpO2: 98 RA  MODE:  Ambulation: 400 ft   POST:  Rate/Rhythm: 90 pacing    BP: sitting 90/78, retake 130/58     SpO2:    Pt tolerated well, no angina or groin pain.  Discussed with pt stent, restrictions, Plavix  importance, diet, exercise, NTG and CRPII. Pt receptive. Will refer to [facility].  [phone]  Aliene Aris BS, ACSM-CEP 02/04/2024 9:45 AM

## 2024-02-04 NOTE — Plan of Care (Signed)
  Problem: Education: Goal: Understanding of CV disease, CV risk reduction, and recovery process will improve Outcome: Progressing Goal: Individualized Educational Video(s) Outcome: Progressing   Problem: Cardiovascular: Goal: Ability to achieve and maintain adequate cardiovascular perfusion will improve Outcome: Progressing Goal: Vascular access site(s) Level 0-1 will be maintained Outcome: Progressing   Problem: Activity: Goal: Ability to return to baseline activity level will improve Outcome: Progressing   Problem: Health Behavior/Discharge Planning: Goal: Ability to safely manage health-related needs after discharge will improve Outcome: Progressing   

## 2024-02-04 NOTE — Progress Notes (Signed)
 Discharge Nurse Summary: DC order noted per MD. DC RN at bedside with patient. Patient agreeable with discharge plan, states wife will arrive soon for pickup. AVS printed/reviewed. PIVs removed, skin intact. No DME needs. No home/TOC meds. CP/Edu resolved. Telemonitor returned to charging station. All belongings accounted for. Dressing to cath site, CDI w/o bleeding or drainage. See LDAs. Patient wheeled downstairs by volunteer transport for discharge by private auto.   Rosario EMERSON Lund, RN

## 2024-02-08 ENCOUNTER — Other Ambulatory Visit: Payer: Self-pay | Admitting: Nurse Practitioner

## 2024-02-08 DIAGNOSIS — I25119 Atherosclerotic heart disease of native coronary artery with unspecified angina pectoris: Secondary | ICD-10-CM

## 2024-02-09 ENCOUNTER — Telehealth (HOSPITAL_COMMUNITY): Payer: Self-pay

## 2024-02-09 NOTE — Telephone Encounter (Signed)
Per phase I cardiac rehab, fax referral to [place]. 

## 2024-02-10 ENCOUNTER — Encounter

## 2024-02-16 ENCOUNTER — Ambulatory Visit

## 2024-02-17 LAB — CUP PACEART REMOTE DEVICE CHECK
Battery Remaining Longevity: 5 mo
Battery Voltage: 2.63 V
Brady Statistic AP VP Percent: 9.45 %
Brady Statistic AP VS Percent: 0 %
Brady Statistic AS VP Percent: 90.55 %
Brady Statistic AS VS Percent: 0 %
Brady Statistic RA Percent Paced: 9.34 %
Brady Statistic RV Percent Paced: 99.99 %
Date Time Interrogation Session: 20251020022428
Implantable Lead Connection Status: 753985
Implantable Lead Connection Status: 753985
Implantable Lead Connection Status: 753985
Implantable Lead Implant Date: 20180315
Implantable Lead Implant Date: 20180315
Implantable Lead Implant Date: 20180315
Implantable Lead Location: 753858
Implantable Lead Location: 753859
Implantable Lead Location: 753860
Implantable Lead Model: 4076
Implantable Lead Model: 4298
Implantable Lead Model: 5076
Implantable Pulse Generator Implant Date: 20180315
Lead Channel Impedance Value: 285 Ohm
Lead Channel Impedance Value: 304 Ohm
Lead Channel Impedance Value: 380 Ohm
Lead Channel Impedance Value: 399 Ohm
Lead Channel Impedance Value: 399 Ohm
Lead Channel Impedance Value: 399 Ohm
Lead Channel Impedance Value: 494 Ohm
Lead Channel Impedance Value: 494 Ohm
Lead Channel Impedance Value: 532 Ohm
Lead Channel Impedance Value: 665 Ohm
Lead Channel Impedance Value: 665 Ohm
Lead Channel Impedance Value: 760 Ohm
Lead Channel Impedance Value: 760 Ohm
Lead Channel Impedance Value: 779 Ohm
Lead Channel Pacing Threshold Amplitude: 1.25 V
Lead Channel Pacing Threshold Amplitude: 1.375 V
Lead Channel Pacing Threshold Amplitude: 2.5 V
Lead Channel Pacing Threshold Pulse Width: 0.4 ms
Lead Channel Pacing Threshold Pulse Width: 0.4 ms
Lead Channel Pacing Threshold Pulse Width: 0.9 ms
Lead Channel Sensing Intrinsic Amplitude: 0.875 mV
Lead Channel Sensing Intrinsic Amplitude: 0.875 mV
Lead Channel Sensing Intrinsic Amplitude: 19.75 mV
Lead Channel Sensing Intrinsic Amplitude: 19.75 mV
Lead Channel Setting Pacing Amplitude: 2 V
Lead Channel Setting Pacing Amplitude: 2.5 V
Lead Channel Setting Pacing Amplitude: 2.5 V
Lead Channel Setting Pacing Pulse Width: 0.8 ms
Lead Channel Setting Pacing Pulse Width: 0.9 ms
Lead Channel Setting Sensing Sensitivity: 4 mV
Zone Setting Status: 755011
Zone Setting Status: 755011

## 2024-02-18 ENCOUNTER — Ambulatory Visit: Payer: Self-pay | Admitting: Internal Medicine

## 2024-02-23 ENCOUNTER — Encounter

## 2024-02-25 ENCOUNTER — Encounter: Payer: Self-pay | Admitting: *Deleted

## 2024-02-27 ENCOUNTER — Ambulatory Visit: Attending: Emergency Medicine | Admitting: Emergency Medicine

## 2024-02-27 ENCOUNTER — Encounter: Payer: Self-pay | Admitting: Emergency Medicine

## 2024-02-27 VITALS — BP 110/66 | HR 66 | Ht 68.0 in | Wt 197.2 lb

## 2024-02-27 DIAGNOSIS — I442 Atrioventricular block, complete: Secondary | ICD-10-CM | POA: Diagnosis not present

## 2024-02-27 DIAGNOSIS — E785 Hyperlipidemia, unspecified: Secondary | ICD-10-CM

## 2024-02-27 DIAGNOSIS — I10 Essential (primary) hypertension: Secondary | ICD-10-CM | POA: Diagnosis not present

## 2024-02-27 DIAGNOSIS — Z951 Presence of aortocoronary bypass graft: Secondary | ICD-10-CM

## 2024-02-27 DIAGNOSIS — I25119 Atherosclerotic heart disease of native coronary artery with unspecified angina pectoris: Secondary | ICD-10-CM | POA: Diagnosis not present

## 2024-02-27 DIAGNOSIS — E118 Type 2 diabetes mellitus with unspecified complications: Secondary | ICD-10-CM

## 2024-02-27 DIAGNOSIS — N1832 Chronic kidney disease, stage 3b: Secondary | ICD-10-CM

## 2024-02-27 DIAGNOSIS — Z794 Long term (current) use of insulin: Secondary | ICD-10-CM

## 2024-02-27 LAB — COMPREHENSIVE METABOLIC PANEL WITH GFR
ALT: 20 IU/L (ref 0–44)
AST: 13 IU/L (ref 0–40)
Albumin: 4.1 g/dL (ref 3.9–4.9)
Alkaline Phosphatase: 115 IU/L (ref 47–123)
BUN/Creatinine Ratio: 16 (ref 10–24)
BUN: 23 mg/dL (ref 8–27)
Bilirubin Total: 0.5 mg/dL (ref 0.0–1.2)
CO2: 21 mmol/L (ref 20–29)
Calcium: 9.4 mg/dL (ref 8.6–10.2)
Chloride: 104 mmol/L (ref 96–106)
Creatinine, Ser: 1.48 mg/dL — ABNORMAL HIGH (ref 0.76–1.27)
Globulin, Total: 2.5 g/dL (ref 1.5–4.5)
Glucose: 145 mg/dL — ABNORMAL HIGH (ref 70–99)
Potassium: 4.3 mmol/L (ref 3.5–5.2)
Sodium: 141 mmol/L (ref 134–144)
Total Protein: 6.6 g/dL (ref 6.0–8.5)
eGFR: 53 mL/min/1.73 — ABNORMAL LOW (ref >59)

## 2024-02-27 LAB — LIPID PANEL
Chol/HDL Ratio: 3.7 ratio (ref 0.0–5.0)
Cholesterol, Total: 136 mg/dL (ref 100–199)
HDL: 37 mg/dL — ABNORMAL LOW (ref >39)
LDL Chol Calc (NIH): 71 mg/dL (ref 0–99)
Triglycerides: 163 mg/dL — ABNORMAL HIGH (ref 0–149)
VLDL Cholesterol Cal: 28 mg/dL (ref 5–40)

## 2024-02-27 NOTE — Progress Notes (Signed)
 Cardiology Office Note:    Date:  02/27/2024  ID:  John Dodson, DOB 23-Dec-1960, MRN [id number] PCP: Delilah Murray HERO., MD  [facility] Providers Cardiologist:  Alm Clay, MD Cardiology APP:  Rana Lum CROME, NP  Electrophysiologist:  Danelle Birmingham, MD       Patient Profile:       Chief Complaint: Follow-up cardiac catheterization History of Present Illness:  John Dodson is a 63 y.o. male with visit-pertinent history of coronary disease s/p multiple prior stents, CABG x 4 in 07/2019 with LIMA to distal LAD, sequential left radial to OM 2 to PDA, RIMA to ramus, complete heart block s/p biventricular PPM on 06/2016, palpitations, hypertension, hyperlipidemia, PE, COVID-19 infection, CKD stage III and type 2 diabetes   Cardiac catheterization prior to bypass surgery in 07/2019 showed EF 55 to 65%, 60% ostial to proximal left circumflex lesion, 60% mid to distal LAD lesion, widely patent OM2 stent, 80% lateral second marginal lesion, widely patent stent in the first LPL, 50% RPA V lesion. Echocardiogram in 2021 showed EF 55 to 60%, normal LV function. Lexiscan  in 09/2021 for chest pain was low risk, EF 62%. At that time it was felt that his chest discomfort was related to incision/scar pain. In January 2024 he continued to note intermittent chest discomfort, predominantly at rest though he did report some exertional symptoms. He was started on Ranexa  500 mg twice daily with improvement.   On 04/28/2023 he underwent cardiac catheterization, there was noted progression of native CAD with a now occluded RCA stent as well as mid LAD at LIMA insertion site. There is widely patent pedicled LIMA to LAD and pedicle RIMA to LDPA with flush occlusion of sequential left radial, not felt to be acute graft failure. His Ranexa  was increased to 1000 mg twice daily with recommendation to reassess for symptom improvement over the next 2 to 4 weeks, if unable to control symptoms recommended  return for staged PCI of the left main to ostial LCx.    He was seen on 05/05/2023.  He reported to continue to have daily chest discomfort worsened by exertion.  He was unable to tolerate increased dose of Ranexa  due to dizziness as well as lightheadedness.  His Ranexa  was decreased back to 500 mg twice daily.  He was then started on amlodipine  5 mg daily.  His losartan  was held due to hyperkalemia.   He was seen for follow-up on 05/13/2023.  He continued to have daily chest discomfort.  He was able to rest with improvement.  He was regularly taking sublingual nitroglycerin .  Noted dizziness and lightheadedness that improved with decreased Ranexa .  On 05/21/2023 patient underwent cardiac catheterization and angioplasty was performed in the main branch and sidebranch, a DES was successfully placed from the ostial left main through the proximal LCx with a Onyx frontier 4.0 x 30 stent.  It was recommended that patient have uninterrupted dual antiplatelet therapy with aspirin  and clopidogrel  for a minimum of 6 months then continue Plavix  monotherapy for lifelong maintenance therapy.  While in Short stay patient developed a hematoma immediately above the TR band.  BP cuff protocol was used.  Site was reevaluated prior to discharge and found to be stable without any complications.  Dr. Lonni Gaskins from vascular surgery and Dr. Clay both reviewed the site, there is no evidence of compartment syndrome.    Echocardiogram 07/01/2023 showed LVEF 60 to 65%, no RWMA, mild LVH of septal segment, grade 1 DD, RV function  normal, normal PASP, trivial MR.   He was last seen by general cardiology on 09/02/2023 by Dr. Anner.  He had no further angina following PCI to the LM/LCx.  He was continued on combination of carvedilol  12.5 mg twice daily, amlodipine  2.5 mg daily, Ranexa  500 mg twice daily, losartan  25 mg daily.  He was on combination of Lipitor  plus Repatha  along with Ozempic and Jardiance .   He was recently seen  in the ED on 01/16/2024 for chest pain.  Cardiology was consulted.  Apparently he was in the office to evaluate his LV lead dysfunction.  Apparently when the test started he developed chest tightness and had a near syncopal episode.  He had noted for the past few weeks he had exertional chest pressure.  Symptoms have resolved with nitroglycerin  x 2 and morphine .  Troponins were negative and EKG was unchanged.  His losartan  was stopped.  His amlodipine  was increased to 5 mg daily and he was started on Imdur  15 mg daily.  He was continued on carvedilol  12.5 mg daily and Ranexa  1000 mg twice daily.  He was last seen in clinic on 01/19/2024.  He continued to experience chest tightness and heaviness over the past 2 weeks.  Therefore he underwent cardiac catheterization on 02/03/2024 showing culprit lesion of ostial LAD to proximal LAD lesion was 99% stenosed.  He underwent PCI/DES placed from the left main into the proximal LAD overlapping the LCx ostium overlapping the previously placed LAD stent and LM stent.  Patient was started on aspirin  81 mg daily and Plavix  75 mg daily while increasing Imdur  to 30 mg daily.   Discussed the use of AI scribe software for clinical note transcription with the patient, who gave verbal consent to proceed.  History of Present Illness John Dodson is a 62 year old male with coronary artery disease who presents for follow-up after cardiac catheterization and stent placement.  Today patient is doing well without acute cardiovascular concerns or complaints.  He reports that his prior chest tightness or heaviness has resolved.  Only once since his cardiac catheterization did he experience 1 episode of tightness however this resolved quickly.  Tells me he feels much better and back at his baseline.  He denies any further episodes of exertional chest pains.  Denies any dyspnea, orthopnea, PND, LEE.  No headaches, palpitations, syncope or presyncope.  Today he tells me that he  does frequently miss his Ozempic and Repatha  doses.  He has not taken Repatha  now in at least a couple months.   Review of systems:  Please see the history of present illness. All other systems are reviewed and otherwise negative.      Studies Reviewed:        Cardiac catheterization 02/03/2024   Mid LM to Ost LAD lesion is 20% stenosed with 0% stenosed side branch in Ost Cx to Prox Cx.   CULPRIT LESION: Ost LAD to Prox LAD lesion is 99% stenosed.   Angioplasty was performed in the main branch. Balloon angioplasty was performed using a 3.5 mm x 18 mm [place] balloon   A drug-eluting stent was successfully placed from the Left Main into the proximal LAD overlapping the LCx ostium (reverse culotte technique) using a STENT ONYX FRONTIER 3.5X12 in the Left Main-Proximal LAD branch-overlapping the previously placed LAD stent and into the LM stent.  Stent was reported to 3.6 mm with POT dilation in the LM with a 4.5 mm balloon to 4.6 mm..   Kissing balloon  angioplasty with 3.5 mm [place] balloons from the LM into ostial LAD and ostial LCx. Post intervention, there is a 0% residual stenosis. Post intervention, the side branch was reduced to 20% residual stenosis.   ---------------------------------------------   Prox LAD to Mid LAD stent is 10% stenosed.  Mid LAD to Dist LAD lesion is 100% stenosed.   Previously placed prox Cx to Mid Cx lesion is widely patent; Previously placed stent in the 1st LPL is widely patent   LPDA lesion is 5% stenosed.  LPAV lesion is 50% stenosed.  2nd Mrg lesion is 45% stenosed.  Lat 2nd Mrg lesion is 50% stenosed.   Ost RCA to Mid RCA lesion is 100% stenosed.   ------------------GRAFTs---------------------------   in the main branch and side branch.   in the main branch and side branch.   Angioplasty was performed in the main branch. .   Angioplasty was performed in the main branch and side branch. .   Angioplasty was performed in the main branch and side branch. .   Post  intervention, there is a 10% residual stenosis.   LIMA-dLAD graft was visualized by angiography and is normal in caliber.   Post intervention, the side branch was reduced to 20% residual stenosis.   RIMA-PDA graft was visualized by angiography and is moderate in size.   Left radial artery graft was not visualized due to known occlusion.  Origin to Prox Graft lesion before Lat 2nd Mrg is 100% stenosed. Diagnostic Dominance: Left  Intervention   Echocardiogram 07/01/2023  1. Left ventricular ejection fraction, by estimation, is 60 to 65%. The  left ventricle has normal function. The left ventricle has no regional  wall motion abnormalities. There is mild asymmetric left ventricular  hypertrophy of the septal segment. Left  ventricular diastolic parameters are consistent with Grade I diastolic  dysfunction (impaired relaxation).   2. Right ventricular systolic function is normal. The right ventricular  size is not well visualized. There is normal pulmonary artery systolic  pressure. The estimated right ventricular systolic pressure is 23.6 mmHg.   3. The mitral valve is grossly normal. Trivial mitral valve  regurgitation. No evidence of mitral stenosis.   4. The aortic valve is tricuspid. Aortic valve regurgitation is not  visualized. No aortic stenosis is present.   5. The inferior vena cava is normal in size with greater than 50%  respiratory variability, suggesting right atrial pressure of 3 mmHg.   6. Rhythm strip during this exam demonstrates paced versus bundle branch  block.    Cardiac catheterization 05/21/2023   Mid LM to Ost LAD lesion is 50% stenosed with 80% stenosed side branch in Ost Cx to Prox Cx.  Prox Cx to Mid Cx lesion is 45% stenosed.  TIMI-3 flow   ScoreFlex angioplasty was performed (using sequential balloons from 3.0 mm standard balloon to 3.5 and 4.0 mm [place] scoring balloons) in the main branch and side branch.   A drug-eluting stent was successfully placed from  Ostial Left Main through the proximal LCx 45% lesion using a STENT ONYX FRONTIER 4.0X30.  Postdilated to 4.6 mm.   Post intervention, there is a 0% residual stenosis throughout the stented segment in the LM and LCx.  TIMI-3 flow maintained   Ost LAD to Prox LAD lesion is 60% stenosed -stable post PCI; Mid LAD to Dist LAD lesion is 100% stenosed.   ------------------------------   Lat 2nd Mrg lesion is 80% stenosed.   LPAV lesion is 50% stenosed.  LPDA lesion  is 5% stenosed.   1st LPL & 2nd Mrg stents are widely patent   ------------------------------   LV end diastolic pressure is normal.   There is no aortic valve stenosis.   Successful Left Main-Ostial to Proximal LCx DES PCI with score flex PTCA pretreatment using a Onyx Frontier DES 4.0 mm x 30 mm postdilated to 4.6 mm. 80% lesion reduced to 0% with TIMI-3 flow. Sidebranch flow in the LAD maintained.  LVEDP 16 mmHg.  Okay for post cath hydration   RECOMMENDATION   In the absence of any other complications or medical issues, we expect the patient to be ready for discharge from an interventional cardiology perspective on 05/21/2023.         Will allow for 8 hours post cath hydration given his creatinine of 1.7.   Recommend uninterrupted dual antiplatelet therapy with Aspirin  81mg  daily and Clopidogrel  75mg  daily for a minimum of 6 months (stable ischemic heart disease-Class I recommendation).   With then continue Plavix  monotherapy (SAPT) for lifelong maintenance therapy. Diagnostic Dominance: Left  Intervention    Cardiac catheterization 04/28/2023   Ost LAD to Mid LAD lesion is 10% stenosed. Mid LAD to Dist LAD lesion is 100% stenosed.   Ost Cx to Prox Cx lesion is 65% stenosed.->  Previously evaluated prior to CABG as FFR positive   LPAV lesion is 50% stenosed.   Lat 2nd Mrg lesion is 80% stenosed.   Previously placed LPDA stent is 5% stenosed.   Ost RCA to Mid RCA STENT is 100% stenosed.   Previously placed 2nd Mrg stent of  unknown type is  widely patent.   Previously placed 1st LPL stent of unknown type is  widely patent.   GRAFTS.   RIMA-PDA graft was visualized by angiography and is moderate in size. The graft exhibits no disease.   LIMA-LAD graft was visualized by angiography and is normal in caliber. The graft exhibits no disease.   Seq LRAD-OM1*LPL1(OM2) graft was injected, but not not visualized due to occlusion. Origin to Prox Graft lesion before Lat 2nd Mrg is 100% stenosed. ->  Does not appear to to be freshly occluded especially in light of negative troponins.   LV end diastolic pressure is mildly elevated.   FINDINGS SUMMARY Progression of native CAD: Now occluded RCA stent as well as mid LAD at LIMA insertion site.  Widely patent pedicled LIMA-LAD and pedicle RIMA-LPDA with flush occlusion of sequential Left Radial - does not appear to be acute graft failure. Mildly Elevated LVEDP ~18 mmHg     RECOMMENDATIONS Post cath hydration an hour for 8 hours.  Closely monitor renal function Titrate medical management with increasing Ranexa  to 1000 mg twice daily -> reassess for symptom improvement over the next 2 to 4 weeks to determine if symptoms have improved, if not able to control symptoms, would return for staged PCI of the Left Main-ostial LCx. Diagnostic Dominance: Left  Lexiscan  Myoview  10/03/2021   The study is normal. The study is low risk.   No ST deviation was noted.   LV perfusion is normal.   Left ventricular function is normal. Nuclear stress EF: 62 %. The left ventricular ejection fraction is normal (55-65%). End diastolic cavity size is normal.   Prior study available for comparison from 08/20/2018.   Low risk stress nuclear study with normal perfusion and normal left ventricular regional and global systolic function.   ZIO 06/13/2021 Underlying predominant rhythm is Sinus Rhythm with an average rate of 84 bpm and a  rate range of 60 to 118 bpm Very rare PACs and PVCs noted. There are  also noted appropriate pacing as well as occasional episodes where there is pacing with intrinsic beats also noted.     Patch Wear Time:  14 days and 0 hours (2023-01-23T07:23:41-0500 to 2023-02-06T07:23:45-0500)     Overall, pretty normal monitor.  Will need EP to assess pacemaker to see if there is any true arrhythmias.   Cardiac catheterization 08/20/2019 The left ventricular systolic function is normal. The left ventricular ejection fraction is 55-65% by visual estimate. LV end diastolic pressure is normal.LV end diastolic pressure is normal. ------------------- There is stent from ostial to almost distal small nondominant RCA: Ost RCA to Mid RCA stent is diffusely 35% stenosed. Ost Cx to Prox Cx lesion is 60% stenosed. Ost LAD to Mid LAD stent is 10% stenosed. Mid LAD to Dist LAD lesion is 60% stenosed. Previously placed 2nd Mrg stent (DES) is widely patent. Lat 2nd Mrg lesion is 80% stenosed. Previously described. Previously placed 1st LPL stent (DES) is widely patent. LPAV lesion is 50% stenosed. LPDA stent is 5% stenosed.   Multivessel CAD with essentially patent stents in proximal to mid LAD, proximal OM1, proximal LPL1, several stents in the L PDA as well as RCA that is a small nondominant vessel.  No obvious lesions within stents noted. Ostial LCx roughly 60% --> highly DFR positive with 0.84 Long mid LAD 60% after stent--highly DFR +0.67-0.73 Normal LVEDP   With significant LAD as well as ostial LCx disease--best course of action is CVTS consultation for CABG. Diagnostic Dominance: Left    Risk Assessment/Calculations:              Physical Exam:   VS:  BP 110/66   Pulse 66   Ht 5' 8 (1.727 m)   Wt 197 lb 3.2 oz (89.4 kg)   SpO2 94%   BMI 29.98 kg/m    Wt Readings from Last 3 Encounters:  02/27/24 197 lb 3.2 oz (89.4 kg)  02/03/24 194 lb (88 kg)  01/19/24 196 lb (88.9 kg)    GEN: Well nourished, well developed in no acute distress NECK: No JVD; No carotid  bruits CARDIAC: RRR, no murmurs, rubs, gallops RESPIRATORY:  Clear to auscultation without rales, wheezing or rhonchi  ABDOMEN: Soft, non-tender, non-distended EXTREMITIES:  No edema; No acute deformity      Assessment and Plan:  Coronary artery disease  Multiple PCI's with four-vessel CABG 07/2019 with LIMA to LAD, left radial to OM 2/PDA, RIMA-LDPA.  Underwent PCI from the ostial left main into left circumflex in January 2025.  Most recent catheterization on 01/2024 showed patent LIMA to LAD, RIMA to PDA, culprit lesion being jailed Ostial LAD 99% stenosis in the unstented area => successful difficult OCT guided reverse culotte DES PCI of the ostial LAD with kissing balloon angioplasty of the ostial LCx after stent placement-placed a 3.5 mm x 12 mm Onyx Frontier DES deployed to 3.6 mm in LAD and postdilated to 4.6 mm in the Left Main.  Recommendations for continued DAPT with aspirin /Plavix   - Today patient is stable without chest pains. chest tightness and heaviness that led to recent cardiac catheterization has resolved.  No longer experiences any exertional symptoms.  Reports feels back to his prior baseline - No symptoms to suggest active angina.  No indication for ischemic evaluation at this time - Reports he frequently misses his Ozempic as well as Repatha .  Has not taken Repatha  in at least  a couple months.  Much education given on medication adherence today.  He will set reminders on his phone from now on - Continue amlodipine  5 mg daily, carvedilol  12.5 mg twice daily, isosorbide  30 mg daily, ranolazine  500 mg twice daily, and nitroglycerin  as needed - Continue aspirin  81 mg daily and clopidogrel  75 mg daily - Continue atorvastatin  80 mg daily, Repatha  140 mg q. 14 days, Jardiance  25 mg daily, and Ozempic 0.5 mg q. 7 days - BMET and CBC today - Okay to begin cardiac rehab   CHB S/p BiV PPM in 2018 - Management per EP   Hypertension Blood pressure today is well-controlled at  110/66 - Continue amlodipine  5 mg daily, carvedilol  12.5 mg twice daily, isosorbide  30 mg daily   Hyperlipidemia, LDL goal <55 LDL 59 on 10/2023 and previously 9 on 07/2023 - LDL slightly above goal, he has not been adherent to Repatha  - Much education given today on adherence to Repatha .  He will set alarms on his phone as a reminder for now - Will repeat LFTs/fasting lipid panel today - Continue atorvastatin  80 mg daily and Repatha  140 mg q. 14 days   T2DM A1c 8.1% on 01/2024 and uncontrolled - Frequently misses his Ozempic - Managed on Ozempic, Lantus , and Jardiance    CKD stage IIIb Creatinine 1.67 and GFR 46 on 01/2024 - Stable.  Avoid nephrotoxic drugs  - Repeat BMET today - Followed by nephrology      Dispo:  Return in about 3 months (around 05/29/2024).  Signed, Lum LITTIE Louis, NP

## 2024-02-27 NOTE — Patient Instructions (Signed)
 Medication Instructions:  NO CHANGES  Lab Work: FASTING LIPID PANEL AND CMET TO BE DONE TODAY.  Testing/Procedures: NONE  Follow-Up: At [facility], you and your health needs are our priority.  As part of our continuing mission to provide you with exceptional heart care, our providers are all part of one team.  This team includes your primary Cardiologist (physician) and Advanced Practice Providers or APPs (Physician Assistants and Nurse Practitioners) who all work together to provide you with the care you need, when you need it.  Your next appointment:   3 MONTHS  Provider:   Alm Clay, MD (ONLY)

## 2024-03-01 ENCOUNTER — Ambulatory Visit: Payer: Self-pay | Admitting: Emergency Medicine

## 2024-03-01 MED ORDER — ISOSORBIDE MONONITRATE ER 30 MG PO TB24: 30.0000 mg | ORAL_TABLET | Freq: Every day | ORAL | 3 refills | Status: AC

## 2024-03-08 ENCOUNTER — Encounter

## 2024-03-12 ENCOUNTER — Encounter

## 2024-03-18 ENCOUNTER — Ambulatory Visit

## 2024-03-18 DIAGNOSIS — I442 Atrioventricular block, complete: Secondary | ICD-10-CM

## 2024-03-22 ENCOUNTER — Encounter

## 2024-03-22 LAB — CUP PACEART REMOTE DEVICE CHECK
Battery Remaining Longevity: 5 mo
Battery Voltage: 2.62 V
Brady Statistic AP VP Percent: 5.25 %
Brady Statistic AP VS Percent: 0 %
Brady Statistic AS VP Percent: 94.75 %
Brady Statistic AS VS Percent: 0 %
Brady Statistic RA Percent Paced: 5.2 %
Brady Statistic RV Percent Paced: 100 %
Date Time Interrogation Session: 20251122103149
Implantable Lead Connection Status: 753985
Implantable Lead Connection Status: 753985
Implantable Lead Connection Status: 753985
Implantable Lead Implant Date: 20180315
Implantable Lead Implant Date: 20180315
Implantable Lead Implant Date: 20180315
Implantable Lead Location: 753858
Implantable Lead Location: 753859
Implantable Lead Location: 753860
Implantable Lead Model: 4076
Implantable Lead Model: 4298
Implantable Lead Model: 5076
Implantable Pulse Generator Implant Date: 20180315
Lead Channel Impedance Value: 266 Ohm
Lead Channel Impedance Value: 304 Ohm
Lead Channel Impedance Value: 380 Ohm
Lead Channel Impedance Value: 399 Ohm
Lead Channel Impedance Value: 399 Ohm
Lead Channel Impedance Value: 418 Ohm
Lead Channel Impedance Value: 494 Ohm
Lead Channel Impedance Value: 494 Ohm
Lead Channel Impedance Value: 494 Ohm
Lead Channel Impedance Value: 646 Ohm
Lead Channel Impedance Value: 665 Ohm
Lead Channel Impedance Value: 760 Ohm
Lead Channel Impedance Value: 779 Ohm
Lead Channel Impedance Value: 779 Ohm
Lead Channel Pacing Threshold Amplitude: 1.25 V
Lead Channel Pacing Threshold Amplitude: 1.375 V
Lead Channel Pacing Threshold Amplitude: 2.375 V
Lead Channel Pacing Threshold Pulse Width: 0.4 ms
Lead Channel Pacing Threshold Pulse Width: 0.4 ms
Lead Channel Pacing Threshold Pulse Width: 0.9 ms
Lead Channel Sensing Intrinsic Amplitude: 1 mV
Lead Channel Sensing Intrinsic Amplitude: 1 mV
Lead Channel Sensing Intrinsic Amplitude: 19.75 mV
Lead Channel Sensing Intrinsic Amplitude: 19.75 mV
Lead Channel Setting Pacing Amplitude: 2 V
Lead Channel Setting Pacing Amplitude: 2.5 V
Lead Channel Setting Pacing Amplitude: 2.5 V
Lead Channel Setting Pacing Pulse Width: 0.8 ms
Lead Channel Setting Pacing Pulse Width: 0.9 ms
Lead Channel Setting Sensing Sensitivity: 4 mV
Zone Setting Status: 755011
Zone Setting Status: 755011

## 2024-03-23 NOTE — Progress Notes (Signed)
 Remote PPM Transmission

## 2024-03-31 ENCOUNTER — Ambulatory Visit: Payer: Self-pay | Admitting: Internal Medicine

## 2024-04-05 ENCOUNTER — Encounter

## 2024-04-18 ENCOUNTER — Other Ambulatory Visit: Payer: Self-pay | Admitting: Cardiology

## 2024-04-18 ENCOUNTER — Ambulatory Visit: Attending: Internal Medicine

## 2024-04-18 LAB — CUP PACEART REMOTE DEVICE CHECK
Battery Remaining Longevity: 4 mo
Battery Voltage: 2.62 V
Brady Statistic AP VP Percent: 0.63 %
Brady Statistic AP VS Percent: 0 %
Brady Statistic AS VP Percent: 99.37 %
Brady Statistic AS VS Percent: 0 %
Brady Statistic RA Percent Paced: 0.62 %
Brady Statistic RV Percent Paced: 100 %
Date Time Interrogation Session: 20251221012634
Implantable Lead Connection Status: 753985
Implantable Lead Connection Status: 753985
Implantable Lead Connection Status: 753985
Implantable Lead Implant Date: 20180315
Implantable Lead Implant Date: 20180315
Implantable Lead Implant Date: 20180315
Implantable Lead Location: 753858
Implantable Lead Location: 753859
Implantable Lead Location: 753860
Implantable Lead Model: 4076
Implantable Lead Model: 4298
Implantable Lead Model: 5076
Implantable Pulse Generator Implant Date: 20180315
Lead Channel Impedance Value: 266 Ohm
Lead Channel Impedance Value: 304 Ohm
Lead Channel Impedance Value: 361 Ohm
Lead Channel Impedance Value: 399 Ohm
Lead Channel Impedance Value: 418 Ohm
Lead Channel Impedance Value: 437 Ohm
Lead Channel Impedance Value: 494 Ohm
Lead Channel Impedance Value: 513 Ohm
Lead Channel Impedance Value: 513 Ohm
Lead Channel Impedance Value: 646 Ohm
Lead Channel Impedance Value: 665 Ohm
Lead Channel Impedance Value: 779 Ohm
Lead Channel Impedance Value: 817 Ohm
Lead Channel Impedance Value: 817 Ohm
Lead Channel Pacing Threshold Amplitude: 1.25 V
Lead Channel Pacing Threshold Amplitude: 1.375 V
Lead Channel Pacing Threshold Amplitude: 2.5 V
Lead Channel Pacing Threshold Pulse Width: 0.4 ms
Lead Channel Pacing Threshold Pulse Width: 0.4 ms
Lead Channel Pacing Threshold Pulse Width: 0.9 ms
Lead Channel Sensing Intrinsic Amplitude: 0.875 mV
Lead Channel Sensing Intrinsic Amplitude: 0.875 mV
Lead Channel Sensing Intrinsic Amplitude: 19.75 mV
Lead Channel Sensing Intrinsic Amplitude: 19.75 mV
Lead Channel Setting Pacing Amplitude: 2 V
Lead Channel Setting Pacing Amplitude: 2.5 V
Lead Channel Setting Pacing Amplitude: 2.5 V
Lead Channel Setting Pacing Pulse Width: 0.8 ms
Lead Channel Setting Pacing Pulse Width: 0.9 ms
Lead Channel Setting Sensing Sensitivity: 4 mV
Zone Setting Status: 755011
Zone Setting Status: 755011

## 2024-04-19 ENCOUNTER — Encounter

## 2024-04-25 ENCOUNTER — Ambulatory Visit: Payer: Self-pay | Admitting: Internal Medicine

## 2024-05-12 ENCOUNTER — Ambulatory Visit: Attending: Cardiology | Admitting: Cardiology

## 2024-05-12 ENCOUNTER — Encounter: Payer: Self-pay | Admitting: Cardiology

## 2024-05-12 VITALS — BP 112/67 | HR 73 | Ht 68.0 in | Wt 196.6 lb

## 2024-05-12 DIAGNOSIS — R002 Palpitations: Secondary | ICD-10-CM | POA: Diagnosis not present

## 2024-05-12 DIAGNOSIS — Z9861 Coronary angioplasty status: Secondary | ICD-10-CM | POA: Diagnosis not present

## 2024-05-12 DIAGNOSIS — E785 Hyperlipidemia, unspecified: Secondary | ICD-10-CM | POA: Diagnosis not present

## 2024-05-12 DIAGNOSIS — I2510 Atherosclerotic heart disease of native coronary artery without angina pectoris: Secondary | ICD-10-CM | POA: Diagnosis not present

## 2024-05-12 DIAGNOSIS — Z950 Presence of cardiac pacemaker: Secondary | ICD-10-CM

## 2024-05-12 DIAGNOSIS — I10 Essential (primary) hypertension: Secondary | ICD-10-CM

## 2024-05-12 DIAGNOSIS — I442 Atrioventricular block, complete: Secondary | ICD-10-CM

## 2024-05-12 DIAGNOSIS — Z951 Presence of aortocoronary bypass graft: Secondary | ICD-10-CM | POA: Diagnosis not present

## 2024-05-12 DIAGNOSIS — E1169 Type 2 diabetes mellitus with other specified complication: Secondary | ICD-10-CM

## 2024-05-12 DIAGNOSIS — I25119 Atherosclerotic heart disease of native coronary artery with unspecified angina pectoris: Secondary | ICD-10-CM | POA: Diagnosis not present

## 2024-05-12 DIAGNOSIS — I214 Non-ST elevation (NSTEMI) myocardial infarction: Secondary | ICD-10-CM

## 2024-05-12 NOTE — Patient Instructions (Addendum)
 Medication Instructions:   Stop taking  Amlodipine    *If you need a refill on your cardiac medications before your next appointment, please call your pharmacy*   Lab Work: Not needed    Testing/Procedures:  Not  needed  Follow-Up: At [facility], you and your health needs are our priority.  As part of our continuing mission to provide you with exceptional heart care, we have created designated Provider Care Teams.  These Care Teams include your primary Cardiologist (physician) and Advanced Practice Providers (APPs -  Physician Assistants and Nurse Practitioners) who all work together to provide you with the care you need, when you need it.     Your next appointment:   3 month(s)  The format for your next appointment:   In Person  Provider:    Lum Louis, NP      Then, Alm Clay, MD will plan to see you again in 9 month(s). ( Oct 2026)

## 2024-05-12 NOTE — Progress Notes (Signed)
 John Dodson, date:  05/16/2024  ID:  John Dodson, DOB 10/12/1960, MRN [id number] PCP: Delilah Murray HERO., MD  [facility] Providers Cardiologist:  Alm Clay, MD Cardiology APP:  Rana Lum CROME, NP  Electrophysiologist:  Danelle Birmingham, MD     No chief complaint on file.   Patient Profile: .     John Dodson is a 64 y.o. male with a PMH notable for Multivessel CAD (CABGwith multiple PCI), s/p PPM for CHB, HTN, HLD (on Repatha  and statin), DM-2, history of PE and CKD 3 who presents here for 58-month follow-up at the request of Lum Rana, NP  He continues to follow-up at the request of Delilah Murray HERO., MD.  Date: Multivessel CAD-multiple stents followed by CABG x 4 in April 2021 (LIMA-dLAD, Seq SVG-OM2-PDA, RIMA-RI) Non-STEMI [facility]:  prior LAD & LCx PCI Diagnostic Cardiac Cath December 2024: => Occluded sequential SVG failed medical therapy (unable to tolerate Ranexa ) return for staged PCI January 2025 (see Notes below)-PCI to LM-LCx (4.0 x 30 LM-LCx) 01/2024 - Coulotte PCI of LM -LAD with existing LM-LCx.  History of complete heart block-s/p PPM March 2018 HTN, HLD (on Repatha  and statin), DM-2 History of PE after COVID infection CKD stage III   I last saw John Dodson on 12/03/2023.  He was doing well with no major issues.  He was then seen on January 19, 2024 by Owen, NP.  For ER follow-up where he presented with chest pain and near syncope.  Since that time you noticed significant exertional angina.  Losartan  was discontinued and he was increased to amlodipine  5 mg along with Imdur  50 mg and carvedilol  12.5 mg along with Ranexa  thousand Dusty twice daily -> recommend coronary catheterization performed on October 7 with progression of disease in the ostial LAD that was jailed by the previous stent.  I also realized at that time that his grafts were not complete correctly drawn so I corrected the reports.  I performed complex reverse culotte DES  PCI of the ostial LAD placing a stent from mid LM into the ostial LAD through the LM-LCx stent with completion kissing balloon angioplasty-.SABRA  He had significant angina during the procedure until LAD stent placement.  He was seen for post cath follow-up by [facility], NP on 02/27/2024 stating he was doing well from cardiac standpoint no further angina symptoms.  He felt essentially back to his baseline with no angina or heart failure symptoms.  He was occasionally missing his Ozempic and rapid doses.  Cleared for cardiac rehab.  Labs ordered on reminders for Repatha  dosing set up on his phone      Subjective  Discussed the use of AI scribe software for clinical note transcription with the patient, who gave verbal consent to proceed.  History of Present Illness John Dodson is a 64 year old male with coronary artery disease who presents for follow-up regarding dizziness and medication management.  He underwent a complex angioplasty in October 2025 and has been participating in cardiac rehab since then. No current chest pain or shortness of breath during exercise.    He experiences dizziness, particularly after stopping exercise, describing it as 'bloody dizzy'. He feels fine during the activity and ensures hydration by drinking a bottle of water  before exercising. His blood pressure during these episodes is typically around 116/66 mmHg.  Cardiovascular ROS: no chest pain or dyspnea on exertion positive for - occasional dizziness after exercising, but otherwise no syncope or near syncope. negative for -  edema, irregular heartbeat, loss of consciousness, orthopnea, palpitations, paroxysmal nocturnal dyspnea, rapid heart rate, shortness of breath, or TIA or emesis fugax, claudication.  Melena, hematochezia, hematuria or epistaxis.  She  He is on several medications including Ozempic 0.5 mg, Jardiance  25 mg, Lantus  55 units, amlodipine  2.5 mg, carvedilol  12.5 mg twice daily, losartan  20/12.5 mg,  atorvastatin  80 mg, Repatha  140 mg, aspirin , Plavix , and Ranexa  500 mg twice daily. He uses Lasix  40 mg twice a week when he feels his hands getting tired.  HeHe has a pacemaker with a battery life expected to last about four more months. He experiences occasional heart palpitations, particularly at night, described as a racing sensation, occurring about three times a week. No associated chest pain with these episodes.  Recent lab work includes a total cholesterol of 136 mg/dL, HDL of 37 mg/dL, LDL of 71 mg/dL, and a creatinine of 8.51 mg/dL. His hemoglobin A1c was 7.3% in December 2025, showing improvement from previous levels.     Objective   Pertinent Medications:  CV Meds: DAPT: Aspirin  81 mg daily, Plavix  75 mg daily.   BP/angina/afterload reduction:: Amlodipine  2.5 mg daily, carvedilol  12.5 mg twice daily, losartan  12.5 mg daily;  Antianginal: Isosorbide  mononitrate 30 mg daily, Ranexa  500 mg twice daily PRN furosemide  40 mg taking it 2 times a week for edema Lipids Lipitor  40 mg (1/2 oh of an 80 mg tab) daily along with Repatha  under 40 mg every 2 weeks,  DM: Jardiance  25 mg daily, Ozempic 2 m g / 1.5 mL-1 mg weekly; -Lantus  50 units every afternoon Neurontin  200 mg nightly, Protonix  40 mg daily; allopurinol 300 mg daily; as needed Zofran  and Benadryl    Studies Reviewed: SABRA        Lab Results  Component Value Date   CHOL 136 02/27/2024   HDL 37 (L) 02/27/2024   LDLCALC 71 02/27/2024   TRIG 163 (H) 02/27/2024   CHOLHDL 3.7 02/27/2024   Lab Results  Component Value Date   NA 141 02/27/2024   K 4.3 02/27/2024   CREATININE 1.48 (H) 02/27/2024   EGFR 53 (L) 02/27/2024   GLUCOSE 145 (H) 02/27/2024   Results Labs Lab Results  Component Value Date   CHOL 136 02/27/2024   HDL 37 (L) 02/27/2024   LDLCALC 71 02/27/2024   TRIG 163 (H) 02/27/2024   CHOLHDL 3.7 02/27/2024   Lab Results  Component Value Date   NA 141 02/27/2024   K 4.3 02/27/2024   CREATININE 1.48 (H)  02/27/2024   EGFR 53 (L) 02/27/2024   GLUCOSE 145 (H) 02/27/2024   Lab Results  Component Value Date   WBC 12.9 (H) 02/04/2024   HGB 15.1 02/04/2024   HCT 46.6 02/04/2024   MCV 90.8 02/04/2024   PLT 153 02/04/2024   Lab Results  Component Value Date   HGBA1C 7.8 (H) 11/24/2019   From PCP: A1c (04/20/2024): 7.3 decreased from 8.1 on 02/24/2024, 7.8, and 6.6 on 10/2023  Diagnostic Pacemaker interrogation (03/2024): Estimated battery life approximately four months; no arrhythmias detected; no rhythm disturbances noted  CATH 04/28/2023: Progression of native disease-occluded RCA stent as well as mid LAD at LIMA insertion.  Widely patent pedicled LIMA to LAD and pedicled RIMA to LPDA with flush occlusion of sequential left radial. Staged PCI 05/21/2023: Successful Left Main-Ostial to Proximal LCx DES PCI with score flex PTCA pretreatment using a Onyx Frontier DES 4.0 mm x 30 mm postdilated to 4.6 mm. 80% lesion reduced to 0% with TIMI-3 flow.  Sidebranch flow in the LAD maintained.  Diagnostic       Intervention   Staged PCI #2 (02/03/2024):SABRA  Jailed Ostial LAD 99% stenosis in the unstented area => successful difficult OCT guided reverse culotte DES PCI of the ostial LAD with kissing balloon angioplasty of the ostial LCx after stent placement-placed a 3.5 mm x 12 mm Onyx Frontier DES deployed to 3.6 mm in LAD and postdilated to 4.6 mm in the Left Main. => Kissing balloon angioplasty maintaining patent LAD and LCx  Diagnostic:  Dominance: Left                                                       Intervention     Cardiac Cath-PCI 12/28/2018 ([place]): 20% LMCA, 40% ostial LAD, 80% distal LAD.  Ost-prox LCx 30%, OM 230%, OM1 50%.  Ost RCA 70% => (Resolute Onyx 2.5 x 12 mm DES (2.6 mm), mid RCA 30% Cardiac Cath 08/20/2019: MV CAD: ost-prox LAD stent 10% ISR followed by long 60% stenosis; Ost LCx 60% with 80% ostial OM1 jailed by stent in mid LCx crossing AVG LCx.  Patent LPL 1 and L PDA  stent; nondominant RCA with extensive stent 35% ISR.  LCx DFR 0.84, LAD beyond the stent 0.67. => Recommended CABG CABG x 4 (08/17/2019): LIMA-dLAD, LRAD-LPDA, RIMA-RI(~OM1) and  ECHO 07/01/2023:: Normal LVEF of 60 to 65%.  No RWMA.  Mild septal hypertrophy.  GR 1 DD.  Normal PAP and RAP.  Normal valves.  EKG shows paced rhythm versus bundle branch block  Risk Assessment/Calculations:         Physical Exam:   VS:  BP 112/67 (BP Location: Left Arm, Patient Position: Sitting, Cuff Size: Normal)   Pulse 73   Ht 5' 8 (1.727 m)   Wt 196 lb 9.6 oz (89.2 kg)   SpO2 94%   BMI 29.89 kg/m    Wt Readings from Last 3 Encounters:  05/12/24 196 lb 9.6 oz (89.2 kg)  02/27/24 197 lb 3.2 oz (89.4 kg)  02/03/24 194 lb (88 kg)     GEN: Well nourished, well groomed; in no acute distress; healthy-appearing, NECK: No JVD; No carotid bruits CARDIAC: Normal S1, S2; RRR, no murmurs, rubs, gallops RESPIRATORY:  Clear to auscultation without rales, wheezing or rhonchi ; nonlabored, good air movement. ABDOMEN: Soft, non-tender, non-distended EXTREMITIES:  No edema; No deformity      ASSESSMENT AND PLAN: .   Coronary artery disease involving native coronary artery of native heart with angina pectoris Recent angioplasty with stenting of LAD and graft artery.  No current angina or chest pain with combination of amlodipine  2.5 mg daily, carvedilol  12.5 mg twice daily, Imdur  30 daily and Ranexa  500 mg twice daily . Dizziness post-exercise likely due to amlodipine . - Discontinue amlodipine  to assess impact on dizziness. - Continue low-dose losartan  for afterload reduction - Continue DAPT: Aspirin  81 mg and Plavix  75 mg given bifurcation stenting - Continue Repatha  and atorvastatin  along with Jardiance  and Ozempic - Continue cardiac rehabilitation.  CAD S/P PCI; no further angina Now with overlapping stents from the LM into LAD as well as LCx with resolution of angina. Plan for long-term antiplatelet agent:  DAPT x 1 year and then stop aspirin  with continuing Plavix .  Long-term.  ->.  Would prefer to avoid holding DAPT for  procedures or surgeries until May 2025 unless for emergent procedures  As of May 2025 to consider holding DAPT for urgent procedures or surgeries but otherwise prefer to wait until October 2025.   Non-STEMI (non-ST elevated myocardial infarction) (HCC) Non-STEMI back in 2021 as well as presumably in the past.  Echo in March 2025 showed preserved EF with no real WMA.    Complete heart block (HCC) Status post PPM. Pacemaker battery life approximately four months remaining. No recent arrhythmias noted. Reports of palpitations at night, likely premature beats. - Schedule pacemaker replacement in the next few months. - Monitor pacemaker function with monthly remote checks. - Communicate symptoms to pacemaker monitoring team via high chart messages.  S/P CABG x 4: LIMA-LAD, L rad-OM2-PDA, RIMA-OM1 LIMA to LAD patent and RIMA to diminutive OM branch patent does not last cath. Would not be due for ischemic evaluation for another 4 years unless symptoms warrant.  Palpitations Reports of palpitations at night, likely premature beats. No significant arrhythmias noted. - Continue to monitor symptoms and communicate with pacemaker monitoring team. = Need to determine if he is truly having arrhythmia.>  - Continue carvedilol  12.5 mg twice daily  Essential hypertension Blood pressure well-controlled. Dizziness post-exercise likely related to amlodipine . - Discontinue amlodipine  to assess impact on dizziness. - Continue carvedilol  12.5 mg twice daily, losartan  12.5 mg daily  Hyperlipidemia associated with type 2 diabetes mellitus (HCC) Recent A1c improved to 7.3%. Cholesterol levels borderline controlled, but patient has not been consistent with Repatha  doses. - Continue current diabetes and lipid-lowering medications.:  - Continue encourage stable Repatha  140 mg every 2 weeks use as  well as weekly Ozempic - Continue atorvastatin  40 mg daily along with Jardiance  25 Miller daily  Should be due for lab check in the spring with PCP.  I asked we get results from PCP when performed. If no labs available at time of 45-month follow-up with APP, would check labs at that time.  Biventricular cardiac pacemaker in situ Continue to follow-up with EP based on recent interrogations, suspected he may require PPM replacement soon.  If necessary, will we can discuss holding DAPT for this procedure.  Will need to be done at least on aspirin , it would be better than stopping both.   No orders of the defined types were placed in this encounter.        Follow-Up: Return in about 3 months (around 08/10/2024) for Follow-up with APP, Alternate 3 months with APP, 6 month with MD.  I spent 58 minutes in the care of HAJIME ASFAW today including reviewing labs (1 minute), reviewing outside labs from PCP (1 minute), reviewing studies (I personally reviewed his cath films from the past 2 interventions-7 minutes), face to face time discussing treatment options (28 minutes), reviewing records from pre and post Visits with Lum Louis, NP (6 minutes), 15 minutes dictating, and documenting in the encounter.      Signed, Alm MICAEL Clay, MD, MS Alm Clay, M.D., M.S. Interventional Cardiologist  [facility] Pager # [phone]

## 2024-05-16 NOTE — Assessment & Plan Note (Signed)
 LIMA to LAD patent and RIMA to diminutive OM branch patent does not last cath. Would not be due for ischemic evaluation for another 4 years unless symptoms warrant.

## 2024-05-16 NOTE — Assessment & Plan Note (Signed)
 Recent angioplasty with stenting of LAD and graft artery.  No current angina or chest pain with combination of amlodipine  2.5 mg daily, carvedilol  12.5 mg twice daily, Imdur  30 daily and Ranexa  500 mg twice daily . Dizziness post-exercise likely due to amlodipine . - Discontinue amlodipine  to assess impact on dizziness. - Continue low-dose losartan  for afterload reduction - Continue DAPT: Aspirin  81 mg and Plavix  75 mg given bifurcation stenting - Continue Repatha  and atorvastatin  along with Jardiance  and Ozempic - Continue cardiac rehabilitation.

## 2024-05-16 NOTE — Assessment & Plan Note (Signed)
 Non-STEMI back in 2021 as well as presumably in the past.  Echo in March 2025 showed preserved EF with no real WMA.

## 2024-05-16 NOTE — Assessment & Plan Note (Signed)
 Continue to follow-up with EP based on recent interrogations, suspected he may require PPM replacement soon.  If necessary, will we can discuss holding DAPT for this procedure.  Will need to be done at least on aspirin , it would be better than stopping both.

## 2024-05-16 NOTE — Assessment & Plan Note (Addendum)
 Recent A1c improved to 7.3%. Cholesterol levels borderline controlled, but patient has not been consistent with Repatha  doses. - Continue current diabetes and lipid-lowering medications.:  - Continue encourage stable Repatha  140 mg every 2 weeks use as well as weekly Ozempic - Continue atorvastatin  40 mg daily along with Jardiance  25 Miller daily  Should be due for lab check in the spring with PCP.  I asked we get results from PCP when performed. If no labs available at time of 76-month follow-up with APP, would check labs at that time.

## 2024-05-16 NOTE — Assessment & Plan Note (Signed)
 Reports of palpitations at night, likely premature beats. No significant arrhythmias noted. - Continue to monitor symptoms and communicate with pacemaker monitoring team. = Need to determine if he is truly having arrhythmia.>  - Continue carvedilol  12.5 mg twice daily

## 2024-05-16 NOTE — Assessment & Plan Note (Signed)
 Blood pressure well-controlled. Dizziness post-exercise likely related to amlodipine . - Discontinue amlodipine  to assess impact on dizziness. - Continue carvedilol  12.5 mg twice daily, losartan  12.5 mg daily

## 2024-05-16 NOTE — Assessment & Plan Note (Signed)
 Status post PPM. Pacemaker battery life approximately four months remaining. No recent arrhythmias noted. Reports of palpitations at night, likely premature beats. - Schedule pacemaker replacement in the next few months. - Monitor pacemaker function with monthly remote checks. - Communicate symptoms to pacemaker monitoring team via high chart messages.

## 2024-05-16 NOTE — Assessment & Plan Note (Signed)
 Now with overlapping stents from the LM into LAD as well as LCx with resolution of angina. Plan for long-term antiplatelet agent: DAPT x 1 year and then stop aspirin  with continuing Plavix .  Long-term.  ->.  Would prefer to avoid holding DAPT for procedures or surgeries until May 2025 unless for emergent procedures  As of May 2025 to consider holding DAPT for urgent procedures or surgeries but otherwise prefer to wait until October 2025.

## 2024-05-19 ENCOUNTER — Ambulatory Visit: Attending: Cardiovascular Disease

## 2024-05-20 ENCOUNTER — Encounter

## 2024-05-20 LAB — CUP PACEART REMOTE DEVICE CHECK
Battery Remaining Longevity: 3 mo
Battery Voltage: 2.61 V
Brady Statistic AP VP Percent: 0.67 %
Brady Statistic AP VS Percent: 0 %
Brady Statistic AS VP Percent: 99.32 %
Brady Statistic AS VS Percent: 0 %
Brady Statistic RA Percent Paced: 0.67 %
Brady Statistic RV Percent Paced: 100 %
Date Time Interrogation Session: 20260121015239
Implantable Lead Connection Status: 753985
Implantable Lead Connection Status: 753985
Implantable Lead Connection Status: 753985
Implantable Lead Implant Date: 20180315
Implantable Lead Implant Date: 20180315
Implantable Lead Implant Date: 20180315
Implantable Lead Location: 753858
Implantable Lead Location: 753859
Implantable Lead Location: 753860
Implantable Lead Model: 4076
Implantable Lead Model: 4298
Implantable Lead Model: 5076
Implantable Pulse Generator Implant Date: 20180315
Lead Channel Impedance Value: 266 Ohm
Lead Channel Impedance Value: 285 Ohm
Lead Channel Impedance Value: 342 Ohm
Lead Channel Impedance Value: 361 Ohm
Lead Channel Impedance Value: 380 Ohm
Lead Channel Impedance Value: 399 Ohm
Lead Channel Impedance Value: 475 Ohm
Lead Channel Impedance Value: 494 Ohm
Lead Channel Impedance Value: 551 Ohm
Lead Channel Impedance Value: 627 Ohm
Lead Channel Impedance Value: 646 Ohm
Lead Channel Impedance Value: 741 Ohm
Lead Channel Impedance Value: 760 Ohm
Lead Channel Impedance Value: 779 Ohm
Lead Channel Pacing Threshold Amplitude: 1.25 V
Lead Channel Pacing Threshold Amplitude: 1.375 V
Lead Channel Pacing Threshold Amplitude: 1.5 V
Lead Channel Pacing Threshold Pulse Width: 0.4 ms
Lead Channel Pacing Threshold Pulse Width: 0.4 ms
Lead Channel Pacing Threshold Pulse Width: 0.9 ms
Lead Channel Sensing Intrinsic Amplitude: 0.875 mV
Lead Channel Sensing Intrinsic Amplitude: 0.875 mV
Lead Channel Sensing Intrinsic Amplitude: 19.75 mV
Lead Channel Sensing Intrinsic Amplitude: 19.75 mV
Lead Channel Setting Pacing Amplitude: 2 V
Lead Channel Setting Pacing Amplitude: 2.5 V
Lead Channel Setting Pacing Amplitude: 2.5 V
Lead Channel Setting Pacing Pulse Width: 0.8 ms
Lead Channel Setting Pacing Pulse Width: 0.9 ms
Lead Channel Setting Sensing Sensitivity: 4 mV
Zone Setting Status: 755011
Zone Setting Status: 755011

## 2024-06-11 ENCOUNTER — Encounter

## 2024-06-19 ENCOUNTER — Ambulatory Visit

## 2024-06-21 ENCOUNTER — Encounter

## 2024-07-20 ENCOUNTER — Ambulatory Visit

## 2024-07-22 ENCOUNTER — Encounter

## 2024-08-12 ENCOUNTER — Ambulatory Visit: Admitting: Emergency Medicine

## 2024-09-10 ENCOUNTER — Encounter

## 2024-12-10 ENCOUNTER — Encounter
